# Patient Record
Sex: Female | Born: 1940
Health system: Southern US, Community
[De-identification: ages and names within clinical notes are randomized; demographics above are authoritative.]

## PROBLEM LIST (undated history)

## (undated) DIAGNOSIS — E7800 Pure hypercholesterolemia, unspecified: Secondary | ICD-10-CM

## (undated) DIAGNOSIS — I10 Essential (primary) hypertension: Secondary | ICD-10-CM

## (undated) DIAGNOSIS — M1990 Unspecified osteoarthritis, unspecified site: Secondary | ICD-10-CM

## (undated) DIAGNOSIS — I219 Acute myocardial infarction, unspecified: Secondary | ICD-10-CM

## (undated) DIAGNOSIS — I499 Cardiac arrhythmia, unspecified: Secondary | ICD-10-CM

## (undated) HISTORY — PX: BREAST EXCISIONAL BIOPSY: SUR124

## (undated) HISTORY — PX: VAGINAL HYSTERECTOMY: SHX2639

## (undated) HISTORY — PX: BREAST BIOPSY: SHX20

## (undated) HISTORY — PX: EYE SURGERY: SHX253

---

## 1999-10-14 ENCOUNTER — Other Ambulatory Visit: Admission: RE | Admit: 1999-10-14 | Discharge: 1999-10-14 | Payer: Self-pay | Admitting: Obstetrics and Gynecology

## 1999-10-15 ENCOUNTER — Ambulatory Visit (HOSPITAL_COMMUNITY): Admission: RE | Admit: 1999-10-15 | Discharge: 1999-10-15 | Payer: Self-pay | Admitting: Internal Medicine

## 1999-10-15 ENCOUNTER — Encounter: Payer: Self-pay | Admitting: Internal Medicine

## 1999-10-20 ENCOUNTER — Ambulatory Visit (HOSPITAL_COMMUNITY): Admission: RE | Admit: 1999-10-20 | Discharge: 1999-10-20 | Payer: Self-pay | Admitting: Internal Medicine

## 1999-10-20 ENCOUNTER — Encounter: Payer: Self-pay | Admitting: Internal Medicine

## 2000-06-05 ENCOUNTER — Emergency Department (HOSPITAL_COMMUNITY): Admission: EM | Admit: 2000-06-05 | Discharge: 2000-06-05 | Payer: Self-pay | Admitting: *Deleted

## 2000-06-05 HISTORY — PX: HERNIA REPAIR: SHX51

## 2000-06-06 ENCOUNTER — Ambulatory Visit (HOSPITAL_COMMUNITY): Admission: RE | Admit: 2000-06-06 | Discharge: 2000-06-06 | Payer: Self-pay | Admitting: Emergency Medicine

## 2000-06-06 ENCOUNTER — Encounter: Payer: Self-pay | Admitting: Emergency Medicine

## 2000-06-13 ENCOUNTER — Encounter (INDEPENDENT_AMBULATORY_CARE_PROVIDER_SITE_OTHER): Payer: Self-pay | Admitting: Specialist

## 2000-06-13 ENCOUNTER — Observation Stay (HOSPITAL_COMMUNITY): Admission: RE | Admit: 2000-06-13 | Discharge: 2000-06-14 | Payer: Self-pay | Admitting: Surgery

## 2000-06-13 HISTORY — PX: LAPAROSCOPIC CHOLECYSTECTOMY: SUR755

## 2000-06-17 ENCOUNTER — Encounter (INDEPENDENT_AMBULATORY_CARE_PROVIDER_SITE_OTHER): Payer: Self-pay | Admitting: *Deleted

## 2000-06-17 ENCOUNTER — Encounter: Payer: Self-pay | Admitting: Emergency Medicine

## 2000-06-18 ENCOUNTER — Encounter: Payer: Self-pay | Admitting: General Surgery

## 2000-06-18 ENCOUNTER — Inpatient Hospital Stay (HOSPITAL_COMMUNITY): Admission: EM | Admit: 2000-06-18 | Discharge: 2000-06-26 | Payer: Self-pay | Admitting: Emergency Medicine

## 2000-06-19 ENCOUNTER — Encounter: Payer: Self-pay | Admitting: General Surgery

## 2001-03-22 ENCOUNTER — Ambulatory Visit (HOSPITAL_COMMUNITY): Admission: RE | Admit: 2001-03-22 | Discharge: 2001-03-22 | Payer: Self-pay | Admitting: Internal Medicine

## 2001-03-22 ENCOUNTER — Encounter: Payer: Self-pay | Admitting: Internal Medicine

## 2007-11-12 ENCOUNTER — Ambulatory Visit (HOSPITAL_COMMUNITY): Admission: RE | Admit: 2007-11-12 | Discharge: 2007-11-12 | Payer: Self-pay | Admitting: Family Medicine

## 2008-06-05 ENCOUNTER — Ambulatory Visit (HOSPITAL_COMMUNITY): Admission: RE | Admit: 2008-06-05 | Discharge: 2008-06-05 | Payer: Self-pay | Admitting: Geriatric Medicine

## 2010-12-14 ENCOUNTER — Ambulatory Visit (HOSPITAL_COMMUNITY)
Admission: RE | Admit: 2010-12-14 | Discharge: 2010-12-14 | Payer: Self-pay | Source: Home / Self Care | Attending: Geriatric Medicine | Admitting: Geriatric Medicine

## 2011-04-23 NOTE — H&P (Signed)
[place]. [facility]  Patient:    Stephanie Newton, Stephanie Newton                    MRN: [id number] Adm. Date:  [id number] Attending:  Abigail Miyamoto A                         History and Physical  CHIEF COMPLAINT:  Crampy abdominal pain, nausea, vomiting, and diarrhea.  HISTORY OF PRESENT ILLNESS:  This 70 year old female is postoperative day #5 from a laparoscopic cholecystectomy with an uncomplicated postoperative course until this morning, when she began having some crampy abdominal pain, nausea, and then profuse large volume vomiting consisting of yellowish-greenish material.  Before that time, she had passed some gas and had a normal bowel movement.  She subsequently developed some diarrhea.  The cramp and pain continued.  She presented to the emergency department for evaluation.  She denied fever or chills.  PAST MEDICAL HISTORY:  Hypertension.  PREVIOUS OPERATIONS:  Breast biopsy, hysterectomy, laparoscopic cholecystectomy.  ALLERGIES:  None known.  MEDICATIONS:  Tenoretic 50/25 one q.d.  SOCIAL HISTORY:  No tobacco use.  She occasionally has an alcoholic beverage.  REVIEW OF SYSTEMS:  CARDIOVASCULAR:  No known heart disease.  PULMONARY:  No asthma, COPD, or TB.  NEUROLOGIC:  No seizure disorder or history of stroke. HEMATOLOGIC:  No report of blood transfusion.  ENDOCRINE:  No diabetes or thyroid disorder.  GI:  No hiatal hernia, reflux, peptic ulcer disease, or hepatitis.  PHYSICAL EXAMINATION:  GENERAL:  An ill-appearing female.  VITAL SIGNS:  Temperature 99.2, blood pressure 147/72, pulse 100.  SKIN:  Warm and dry without rash.  HEENT:  Eyes reveal extraocular motions intact and sclerae are clear.  NECK:  Supple without palpable masses.  CARDIOVASCULAR:  Heart demonstrates an increased rate with a regular rhythm. No lower extremity edema.  RESPIRATORY:  Breath sounds clear and equal.  Respirations unlabored.  ABDOMEN:  Soft and  distended.  There is mild, diffuse tenderness but no peritoneal signs.  The incisions are clean and intact.  There are hypoactive bowel sounds present.  LABORATORY DATA:  Abdominal x-rays demonstrate a few air filled small bowel loops but no significant dilatation.  There is a ______ of gas present.  Laboratory remarkable for white blood cell count 12,200.  Hemoglobin 15.4. Amylase, lipase, liver function tests all normal.  Potassium 5.0.  IMPRESSION:  Crampy abdominal pain with nausea and vomiting, and now some diarrhea.  This may be partial small intestinal obstruction versus and ileus versus a gastroenteritis.  PLAN: Admit, IV fluid hydration, will repeat x-rays and labs later today. DD:  06/18/00 TD:  06/18/00 Job: [id number]

## 2011-04-23 NOTE — Op Note (Signed)
[facility]  Patient:    Stephanie Newton, Stephanie Newton                      MRN: [id number] Proc. Date: 06/13/00 Attending:  Abigail Miyamoto, M.D.                           Operative Report  PREOPERATIVE DIAGNOSIS:  Symptomatic cholelithiasis.  POSTOPERATIVE DIAGNOSIS:  Acute cholecystitis.  OPERATION PERFORMED:  SURGEON:  Abigail Miyamoto, M.D.  ASSISTANT:  Milus Mallick, M.D.  ANESTHESIA:  General endotracheal.  ESTIMATED BLOOD LOSS:  Minimal.  DESCRIPTION OF PROCEDURE:  The patient was brought to the operating room and identified as [name].  She was placed supine on the operating table and general anesthesia was induced.  Her abdomen was then prepped and draped in the usual sterile fashion.  Using a #15 blade a small transverse incision was made just below the umbilicus.  The incision was carried down to the abdominal fascia.  The fascia was then opened with the scalpel.  The hemostat was then used to pass through the peritoneal cavity.  Next, a 0 Vicryl pursestring suture was placed around the fascial opening.  The side port was placed through the opening and insufflation of the abdomen was begun.  Next, an 11 port was placed in the patients epigastrium and two 5 mm ports were placed in the patients right flank all under direct vision.  The gallbladder was then identified.  It was found to be quite inflamed and edematous.  At this point a needle aspirator was inserted in the gallbladder and white bile was aspirated.  Next, the gallbladder was grasped and retracted above the liver bed.  Dissection was then carried out at the hilum.  The cystic duct was dissected out, clipped three times proximally and once distally and transected with the scissors.  The cystic artery and its branch and a posterior branch were both clipped twice proximally and once distally and transected as well. The gallbladder was then slowly dissected free from the liver  bed with the electrocautery.  During dissection the gallbladder was entered and stones were spilled.  Once the gallbladder was completely removed, it was placed in an endosac and removed through the umbilical port.  The abdomen was irrigated with normal saline.  Hemostasis in the liver bed was achieved with electrocautery.  The multiple stones that dropped out of the gallbladder were all removed under direct vision with a large grasper and a large sucker.  The abdomen was again copiously irrigated.  Hemostasis again appeared to be achieved.  At this point the side port was removed from the umbilicus and the 0 Vicryl pursestring suture was fastened in place.  All ports were removed under direct vision and the abdomen was deflated.  The patient tolerated the procedure well.  Sponge, needle and instrument counts were correct at the end of the procedure.  Each wound was then anesthetized with 0.25% Marcaine and then closed with subcutaneous 4-0 Monocryl sutures.  Steri-Strips, gauze and tape were then extubated in the operating room and taken in stable condition to the recovery. DD:  06/13/00 TD:  06/13/00 Job: [id number]

## 2011-04-23 NOTE — Discharge Summary (Signed)
[place]. [facility]  Patient:    Stephanie Newton, Stephanie Newton                    MRN: [id number] Adm. Date:  [id number] Disc. Date: [id number] Attending:  Abigail Miyamoto A                           Discharge Summary  PRINCIPAL DISCHARGE DIAGNOSIS:  Strangulated ventral incisional hernia.  SECONDARY DIAGNOSIS:  Hypertension.  PROCEDURE:  Exploratory laparotomy with small-bowel resection and primary repair of ventral hernia.  REASON FOR ADMISSION:  Ms. Stephanie Newton is a 70 year old female who presented to the emergency department on postoperative day #5 because of severe crampy abdominal pain and large volume vomiting.  At that time she was noted to have a low grade fever. There were a few air-filled small-bowel loops on abdominal x-rays and a white blood cell count of 12,200.  It was felt that she may just be having some ileus and was admitted.  HOSPITAL COURSE:  She continued to be distended and felt just a little better; however, by 36 hours the CT scan was ordered because of failure to progress. This demonstrated some incarcerated small-bowel and subumbilical herniation. She was subsequently taken to the operating room where she underwent the above procedure on June 19, 2000.  Postoperatively, she had a mild postoperative ileus and NG tube remained.  Her ileus slowly resolved and she had some hypokalemia that had to be corrected.  She started to have bowel activity and was started on a diet.  Her diet was advanced and she was discharged to home on June 26, 2000.  DISPOSITION:  The patient was discharged to home on June 26, 2000.  DISCHARGE MEDICATIONS:  She is to resume all of her usual home medications and to take the pain medication given to her by Abigail Miyamoto, M.D.  DISCHARGE INSTRUCTIONS:  She is told to perform no strenuous activity.  She was to call us back for any problems.  FOLLOW-UP:  She is to call to set up an appointment in the office in  two weeks.  DISCHARGE CONDITION:  She was discharged in satisfactory condition. DD:  07/25/00 TD:  07/26/00 Job: [id number]

## 2011-04-23 NOTE — Op Note (Signed)
[place]. [facility]  Patient:    Stephanie Newton, Stephanie Newton                    MRN: [id number] Proc. Date: 06/19/00 Adm. Date:  [id number] Attending:  Abigail Miyamoto A                           Operative Report  PREOPERATIVE DIAGNOSIS:  Small-bowel obstruction secondary to incarcerated ventral incisional hernia.  POSTOPERATIVE DIAGNOSIS:  Strangulated small-bowel obstruction secondary to incarcerated ventral incisional hernia.  PROCEDURE:  Exploratory laparotomy, small bowel resection and primary repair of ventral hernia.  SURGEON:  Adolph Pollack, M.D.  ANESTHESIA:  General.  INDICATION FOR OPERATION:  This is a 70 year old female, status post laparoscopic cholecystectomy, June 13, 2000.  On postop day #4, she had the onset of crampy abdominal pain with nausea and vomiting and large volume of bilious emesis.  She was admitted to the hospital and underwent abdominal films which had then improved.  She underwent a CT scan which demonstrated the herniation and small bowel out into the subcutaneous tissue with proximal obstruction.  She has been running a fever, has been increasingly tender and is brought to the operating room.  TECHNIQUE:  She was placed supine on the operating room and general anesthetic was administered.  The abdomen was sterilely prepped and draped.  A previous transverse subumbilical incision was reincised and opened up.  Approximately a 5-cm loop of small intestine was in the subcutaneous tissue and was strangulated.  I subsequently began at the inferior portion of the incision and made a limited lower midline incision, incising the skin, subcutaneous tissue and fascia.  I then brought up some small bowel.  I divided the small bowel with a stapler proximal and distal to the strangulated segment, then divided its mesentery between the clamps.  I ligated the mesenteric vessels with a Vicryl tie.  I then performed a side-to-side  anastomosis and it was stapled.  The anastomosis was patent but under no tension.  I closed the mesenteric defect with interrupted 3-0 Vicryl sutures.  The piece of small bowel was sent to pathology.  Next, I replaced the small bowel back into the abdominal cavity.  I then closed the fascia with running #1 PDS suture.  I irrigated the subcutaneous tissue and closed the skin with staples.  She tolerated the procedure well without any apparent complications and was taken to the recovery room in satisfactory condition. DD:  06/19/00 TD:  06/20/00 Job: [id number]

## 2012-03-16 DIAGNOSIS — R5381 Other malaise: Secondary | ICD-10-CM | POA: Diagnosis not present

## 2012-03-16 DIAGNOSIS — I10 Essential (primary) hypertension: Secondary | ICD-10-CM | POA: Diagnosis not present

## 2012-03-16 DIAGNOSIS — G479 Sleep disorder, unspecified: Secondary | ICD-10-CM | POA: Diagnosis not present

## 2012-06-27 DIAGNOSIS — H2510 Age-related nuclear cataract, unspecified eye: Secondary | ICD-10-CM | POA: Diagnosis not present

## 2012-08-01 DIAGNOSIS — J309 Allergic rhinitis, unspecified: Secondary | ICD-10-CM | POA: Diagnosis not present

## 2012-08-01 DIAGNOSIS — L259 Unspecified contact dermatitis, unspecified cause: Secondary | ICD-10-CM | POA: Diagnosis not present

## 2012-08-01 DIAGNOSIS — H1045 Other chronic allergic conjunctivitis: Secondary | ICD-10-CM | POA: Diagnosis not present

## 2012-09-14 DIAGNOSIS — G479 Sleep disorder, unspecified: Secondary | ICD-10-CM | POA: Diagnosis not present

## 2012-09-14 DIAGNOSIS — Z79899 Other long term (current) drug therapy: Secondary | ICD-10-CM | POA: Diagnosis not present

## 2012-09-14 DIAGNOSIS — Z0000 Encounter for general adult medical examination without abnormal findings: Secondary | ICD-10-CM | POA: Diagnosis not present

## 2012-09-14 DIAGNOSIS — F17200 Nicotine dependence, unspecified, uncomplicated: Secondary | ICD-10-CM | POA: Diagnosis not present

## 2012-09-14 DIAGNOSIS — Z1331 Encounter for screening for depression: Secondary | ICD-10-CM | POA: Diagnosis not present

## 2013-04-03 DIAGNOSIS — Z79899 Other long term (current) drug therapy: Secondary | ICD-10-CM | POA: Diagnosis not present

## 2013-04-03 DIAGNOSIS — M2550 Pain in unspecified joint: Secondary | ICD-10-CM | POA: Diagnosis not present

## 2013-04-03 DIAGNOSIS — E782 Mixed hyperlipidemia: Secondary | ICD-10-CM | POA: Diagnosis not present

## 2013-04-03 DIAGNOSIS — M25559 Pain in unspecified hip: Secondary | ICD-10-CM | POA: Diagnosis not present

## 2013-04-03 DIAGNOSIS — I10 Essential (primary) hypertension: Secondary | ICD-10-CM | POA: Diagnosis not present

## 2013-04-04 DIAGNOSIS — M76899 Other specified enthesopathies of unspecified lower limb, excluding foot: Secondary | ICD-10-CM | POA: Diagnosis not present

## 2013-04-04 DIAGNOSIS — M4310 Spondylolisthesis, site unspecified: Secondary | ICD-10-CM | POA: Diagnosis not present

## 2013-05-29 DIAGNOSIS — I10 Essential (primary) hypertension: Secondary | ICD-10-CM | POA: Diagnosis not present

## 2013-06-28 DIAGNOSIS — H2510 Age-related nuclear cataract, unspecified eye: Secondary | ICD-10-CM | POA: Diagnosis not present

## 2013-08-20 DIAGNOSIS — H2510 Age-related nuclear cataract, unspecified eye: Secondary | ICD-10-CM | POA: Diagnosis not present

## 2013-09-11 DIAGNOSIS — H2589 Other age-related cataract: Secondary | ICD-10-CM | POA: Diagnosis not present

## 2013-09-11 DIAGNOSIS — H2510 Age-related nuclear cataract, unspecified eye: Secondary | ICD-10-CM | POA: Diagnosis not present

## 2013-09-25 DIAGNOSIS — I10 Essential (primary) hypertension: Secondary | ICD-10-CM | POA: Diagnosis not present

## 2013-09-25 DIAGNOSIS — Z79899 Other long term (current) drug therapy: Secondary | ICD-10-CM | POA: Diagnosis not present

## 2013-10-09 DIAGNOSIS — H2589 Other age-related cataract: Secondary | ICD-10-CM | POA: Diagnosis not present

## 2013-10-09 DIAGNOSIS — H2510 Age-related nuclear cataract, unspecified eye: Secondary | ICD-10-CM | POA: Diagnosis not present

## 2014-03-16 DIAGNOSIS — J209 Acute bronchitis, unspecified: Secondary | ICD-10-CM | POA: Diagnosis not present

## 2014-03-26 DIAGNOSIS — Z79899 Other long term (current) drug therapy: Secondary | ICD-10-CM | POA: Diagnosis not present

## 2014-03-26 DIAGNOSIS — I10 Essential (primary) hypertension: Secondary | ICD-10-CM | POA: Diagnosis not present

## 2014-03-26 DIAGNOSIS — E782 Mixed hyperlipidemia: Secondary | ICD-10-CM | POA: Diagnosis not present

## 2014-05-31 DIAGNOSIS — H5310 Unspecified subjective visual disturbances: Secondary | ICD-10-CM | POA: Diagnosis not present

## 2014-05-31 DIAGNOSIS — Z961 Presence of intraocular lens: Secondary | ICD-10-CM | POA: Diagnosis not present

## 2014-08-02 DIAGNOSIS — H1045 Other chronic allergic conjunctivitis: Secondary | ICD-10-CM | POA: Diagnosis not present

## 2014-08-02 DIAGNOSIS — L259 Unspecified contact dermatitis, unspecified cause: Secondary | ICD-10-CM | POA: Diagnosis not present

## 2014-08-02 DIAGNOSIS — J309 Allergic rhinitis, unspecified: Secondary | ICD-10-CM | POA: Diagnosis not present

## 2014-08-16 DIAGNOSIS — L2089 Other atopic dermatitis: Secondary | ICD-10-CM | POA: Diagnosis not present

## 2014-09-04 DIAGNOSIS — M25559 Pain in unspecified hip: Secondary | ICD-10-CM | POA: Diagnosis not present

## 2014-09-10 DIAGNOSIS — M25552 Pain in left hip: Secondary | ICD-10-CM | POA: Diagnosis not present

## 2014-09-25 DIAGNOSIS — Z79899 Other long term (current) drug therapy: Secondary | ICD-10-CM | POA: Diagnosis not present

## 2014-09-25 DIAGNOSIS — E780 Pure hypercholesterolemia: Secondary | ICD-10-CM | POA: Diagnosis not present

## 2014-09-25 DIAGNOSIS — Z1389 Encounter for screening for other disorder: Secondary | ICD-10-CM | POA: Diagnosis not present

## 2014-09-25 DIAGNOSIS — I10 Essential (primary) hypertension: Secondary | ICD-10-CM | POA: Diagnosis not present

## 2014-09-25 DIAGNOSIS — F17210 Nicotine dependence, cigarettes, uncomplicated: Secondary | ICD-10-CM | POA: Diagnosis not present

## 2014-09-25 DIAGNOSIS — M2550 Pain in unspecified joint: Secondary | ICD-10-CM | POA: Diagnosis not present

## 2014-09-25 DIAGNOSIS — Z0000 Encounter for general adult medical examination without abnormal findings: Secondary | ICD-10-CM | POA: Diagnosis not present

## 2014-10-01 DIAGNOSIS — I10 Essential (primary) hypertension: Secondary | ICD-10-CM | POA: Diagnosis not present

## 2014-10-01 DIAGNOSIS — Z79899 Other long term (current) drug therapy: Secondary | ICD-10-CM | POA: Diagnosis not present

## 2014-10-01 DIAGNOSIS — E780 Pure hypercholesterolemia: Secondary | ICD-10-CM | POA: Diagnosis not present

## 2015-01-17 DIAGNOSIS — J0100 Acute maxillary sinusitis, unspecified: Secondary | ICD-10-CM | POA: Diagnosis not present

## 2015-02-10 DIAGNOSIS — J029 Acute pharyngitis, unspecified: Secondary | ICD-10-CM | POA: Diagnosis not present

## 2015-04-29 DIAGNOSIS — E780 Pure hypercholesterolemia: Secondary | ICD-10-CM | POA: Diagnosis not present

## 2015-04-29 DIAGNOSIS — I10 Essential (primary) hypertension: Secondary | ICD-10-CM | POA: Diagnosis not present

## 2015-04-29 DIAGNOSIS — Z79899 Other long term (current) drug therapy: Secondary | ICD-10-CM | POA: Diagnosis not present

## 2015-08-14 DIAGNOSIS — Z961 Presence of intraocular lens: Secondary | ICD-10-CM | POA: Diagnosis not present

## 2015-09-05 DIAGNOSIS — M25562 Pain in left knee: Secondary | ICD-10-CM | POA: Diagnosis not present

## 2015-09-05 DIAGNOSIS — G8929 Other chronic pain: Secondary | ICD-10-CM | POA: Diagnosis not present

## 2015-09-17 DIAGNOSIS — M1712 Unilateral primary osteoarthritis, left knee: Secondary | ICD-10-CM | POA: Diagnosis not present

## 2015-11-20 DIAGNOSIS — L249 Irritant contact dermatitis, unspecified cause: Secondary | ICD-10-CM | POA: Diagnosis not present

## 2015-12-29 ENCOUNTER — Other Ambulatory Visit: Payer: Self-pay

## 2015-12-29 ENCOUNTER — Other Ambulatory Visit: Payer: Self-pay | Admitting: Geriatric Medicine

## 2015-12-29 DIAGNOSIS — R21 Rash and other nonspecific skin eruption: Secondary | ICD-10-CM | POA: Diagnosis not present

## 2015-12-29 DIAGNOSIS — I10 Essential (primary) hypertension: Secondary | ICD-10-CM | POA: Diagnosis not present

## 2015-12-29 DIAGNOSIS — Z1231 Encounter for screening mammogram for malignant neoplasm of breast: Secondary | ICD-10-CM

## 2015-12-29 DIAGNOSIS — E7800 Pure hypercholesterolemia, unspecified: Secondary | ICD-10-CM | POA: Diagnosis not present

## 2015-12-29 DIAGNOSIS — Z1389 Encounter for screening for other disorder: Secondary | ICD-10-CM | POA: Diagnosis not present

## 2015-12-29 DIAGNOSIS — Z0000 Encounter for general adult medical examination without abnormal findings: Secondary | ICD-10-CM | POA: Diagnosis not present

## 2015-12-29 DIAGNOSIS — F17210 Nicotine dependence, cigarettes, uncomplicated: Secondary | ICD-10-CM | POA: Diagnosis not present

## 2015-12-29 DIAGNOSIS — Z79899 Other long term (current) drug therapy: Secondary | ICD-10-CM | POA: Diagnosis not present

## 2016-01-13 ENCOUNTER — Ambulatory Visit
Admission: RE | Admit: 2016-01-13 | Discharge: 2016-01-13 | Disposition: A | Discharge status: Home / Self Care | Payer: Medicare Other | Source: Ambulatory Visit | Attending: Geriatric Medicine | Admitting: Geriatric Medicine

## 2016-01-13 DIAGNOSIS — Z1231 Encounter for screening mammogram for malignant neoplasm of breast: Secondary | ICD-10-CM | POA: Diagnosis not present

## 2016-08-04 DIAGNOSIS — M25562 Pain in left knee: Secondary | ICD-10-CM | POA: Diagnosis not present

## 2016-08-04 DIAGNOSIS — M2242 Chondromalacia patellae, left knee: Secondary | ICD-10-CM | POA: Diagnosis not present

## 2016-08-04 DIAGNOSIS — M25462 Effusion, left knee: Secondary | ICD-10-CM | POA: Diagnosis not present

## 2016-08-04 DIAGNOSIS — M7122 Synovial cyst of popliteal space [Baker], left knee: Secondary | ICD-10-CM | POA: Diagnosis not present

## 2016-08-26 DIAGNOSIS — M25462 Effusion, left knee: Secondary | ICD-10-CM | POA: Diagnosis not present

## 2016-08-26 DIAGNOSIS — M25562 Pain in left knee: Secondary | ICD-10-CM | POA: Diagnosis not present

## 2016-08-26 DIAGNOSIS — M7122 Synovial cyst of popliteal space [Baker], left knee: Secondary | ICD-10-CM | POA: Diagnosis not present

## 2016-08-26 DIAGNOSIS — M1712 Unilateral primary osteoarthritis, left knee: Secondary | ICD-10-CM | POA: Diagnosis not present

## 2016-10-13 DIAGNOSIS — H26491 Other secondary cataract, right eye: Secondary | ICD-10-CM | POA: Diagnosis not present

## 2016-10-13 DIAGNOSIS — Z961 Presence of intraocular lens: Secondary | ICD-10-CM | POA: Diagnosis not present

## 2016-11-27 ENCOUNTER — Encounter (HOSPITAL_COMMUNITY): Payer: Self-pay | Admitting: Nurse Practitioner

## 2016-11-27 ENCOUNTER — Emergency Department (HOSPITAL_COMMUNITY)
Admission: EM | Admit: 2016-11-27 | Discharge: 2016-11-27 | Disposition: A | Discharge status: Home / Self Care | Payer: Medicare Other | Attending: Emergency Medicine | Admitting: Emergency Medicine

## 2016-11-27 DIAGNOSIS — I10 Essential (primary) hypertension: Secondary | ICD-10-CM | POA: Diagnosis not present

## 2016-11-27 DIAGNOSIS — E860 Dehydration: Secondary | ICD-10-CM | POA: Diagnosis not present

## 2016-11-27 DIAGNOSIS — R112 Nausea with vomiting, unspecified: Secondary | ICD-10-CM

## 2016-11-27 DIAGNOSIS — Z79899 Other long term (current) drug therapy: Secondary | ICD-10-CM | POA: Diagnosis not present

## 2016-11-27 DIAGNOSIS — R197 Diarrhea, unspecified: Secondary | ICD-10-CM | POA: Insufficient documentation

## 2016-11-27 DIAGNOSIS — E876 Hypokalemia: Secondary | ICD-10-CM | POA: Diagnosis not present

## 2016-11-27 DIAGNOSIS — F17200 Nicotine dependence, unspecified, uncomplicated: Secondary | ICD-10-CM | POA: Diagnosis not present

## 2016-11-27 HISTORY — DX: Pure hypercholesterolemia, unspecified: E78.00

## 2016-11-27 HISTORY — DX: Essential (primary) hypertension: I10

## 2016-11-27 LAB — COMPREHENSIVE METABOLIC PANEL
ALK PHOS: 66 U/L (ref 38–126)
ALT: 20 U/L (ref 14–54)
AST: 33 U/L (ref 15–41)
Albumin: 3.7 g/dL (ref 3.5–5.0)
Anion gap: 9 (ref 5–15)
BUN: 12 mg/dL (ref 6–20)
CALCIUM: 8.9 mg/dL (ref 8.9–10.3)
CHLORIDE: 111 mmol/L (ref 101–111)
CO2: 20 mmol/L — AB (ref 22–32)
CREATININE: 0.77 mg/dL (ref 0.44–1.00)
GFR calc Af Amer: >60 mL/min (ref >60)
Glucose, Bld: 130 mg/dL — ABNORMAL HIGH (ref 65–99)
Potassium: 2.8 mmol/L — ABNORMAL LOW (ref 3.5–5.1)
SODIUM: 140 mmol/L (ref 135–145)
Total Bilirubin: 0.5 mg/dL (ref 0.3–1.2)
Total Protein: 6.7 g/dL (ref 6.5–8.1)

## 2016-11-27 LAB — CBC WITH DIFFERENTIAL/PLATELET
BASOS ABS: 0 10*3/uL (ref 0.0–0.1)
Basophils Relative: 0 %
Eosinophils Absolute: 0 10*3/uL (ref 0.0–0.7)
Eosinophils Relative: 1 %
HEMATOCRIT: 41.7 % (ref 36.0–46.0)
HEMOGLOBIN: 14.5 g/dL (ref 12.0–15.0)
LYMPHS ABS: 1 10*3/uL (ref 0.7–4.0)
LYMPHS PCT: 23 %
MCH: 30.3 pg (ref 26.0–34.0)
MCHC: 34.8 g/dL (ref 30.0–36.0)
MCV: 87.1 fL (ref 78.0–100.0)
Monocytes Absolute: 0.4 10*3/uL (ref 0.1–1.0)
Monocytes Relative: 8 %
NEUTROS ABS: 3.1 10*3/uL (ref 1.7–7.7)
NEUTROS PCT: 68 %
PLATELETS: 193 10*3/uL (ref 150–400)
RBC: 4.79 MIL/uL (ref 3.87–5.11)
RDW: 13.8 % (ref 11.5–15.5)
WBC: 4.6 10*3/uL (ref 4.0–10.5)

## 2016-11-27 LAB — URINALYSIS, ROUTINE W REFLEX MICROSCOPIC
Bilirubin Urine: NEGATIVE
Glucose, UA: NEGATIVE mg/dL
Ketones, ur: NEGATIVE mg/dL
Nitrite: NEGATIVE
PROTEIN: NEGATIVE mg/dL
SPECIFIC GRAVITY, URINE: 1.01 (ref 1.005–1.030)
pH: 7 (ref 5.0–8.0)

## 2016-11-27 LAB — MAGNESIUM: Magnesium: 1.4 mg/dL — ABNORMAL LOW (ref 1.7–2.4)

## 2016-11-27 LAB — LIPASE, BLOOD: LIPASE: 19 U/L (ref 11–51)

## 2016-11-27 MED ORDER — SODIUM CHLORIDE 0.9 % IV SOLN: 30.0000 meq | Freq: Once | INTRAVENOUS | Status: DC

## 2016-11-27 MED ORDER — POTASSIUM CHLORIDE 10 MEQ/100ML IV SOLN
10.0000 meq | INTRAVENOUS | Status: AC
  Administered 2016-11-27 (×2): 10 meq via INTRAVENOUS
  Filled 2016-11-27 (×3): qty 100

## 2016-11-27 MED ORDER — SODIUM CHLORIDE 0.9 % IV BOLUS (SEPSIS)
500.0000 mL | Freq: Once | INTRAVENOUS | Status: AC
  Administered 2016-11-27: 500 mL via INTRAVENOUS

## 2016-11-27 MED ORDER — POTASSIUM CHLORIDE CRYS ER 20 MEQ PO TBCR
60.0000 meq | EXTENDED_RELEASE_TABLET | Freq: Once | ORAL | Status: AC
  Administered 2016-11-27: 60 meq via ORAL
  Filled 2016-11-27: qty 3

## 2016-11-27 MED ORDER — POTASSIUM CHLORIDE CRYS ER 20 MEQ PO TBCR: 20.0000 meq | EXTENDED_RELEASE_TABLET | Freq: Two times a day (BID) | ORAL | 0 refills | Status: DC

## 2016-11-27 MED ORDER — ONDANSETRON HCL 4 MG/2ML IJ SOLN
4.0000 mg | Freq: Once | INTRAMUSCULAR | Status: AC
  Administered 2016-11-27: 4 mg via INTRAVENOUS
  Filled 2016-11-27: qty 2

## 2016-11-27 MED ORDER — ONDANSETRON HCL 4 MG PO TABS: 4.0000 mg | ORAL_TABLET | Freq: Four times a day (QID) | ORAL | 0 refills | Status: DC

## 2016-11-27 MED ORDER — ATENOLOL 100 MG PO TABS
100.0000 mg | ORAL_TABLET | Freq: Once | ORAL | Status: AC
  Administered 2016-11-27: 100 mg via ORAL
  Filled 2016-11-27: qty 1

## 2016-11-27 MED ORDER — MAGNESIUM SULFATE 2 GM/50ML IV SOLN
2.0000 g | Freq: Once | INTRAVENOUS | Status: AC
  Administered 2016-11-27: 2 g via INTRAVENOUS
  Filled 2016-11-27: qty 50

## 2016-11-27 NOTE — Discharge Instructions (Signed)
He can restart taking your home blood pressure medications. Take the medications as prescribed. Please follow-up with her primary care doctor after the holiday weekend. Return without fail for worsening symptoms, including fever, confusion, intractable vomiting, concern for dehydration, worsening pain, or any other symptoms concerning to you.

## 2016-11-27 NOTE — ED Triage Notes (Signed)
Pt states she has a stomach bug, explains that she has experienced N/V/D for the last 4 days. As a result, she states has also not been taking her antihypertensives and currently hypertensive.EMS infused 500cc of NS which she states helped her feel a little better.

## 2016-11-27 NOTE — ED Provider Notes (Signed)
[place] DEPT Provider Note   CSN: [id number] Arrival date & time: 11/27/16  1539     History   Chief Complaint Chief Complaint  Patient presents with  . N/V/D    Stomach Bug  . Hypertension    HPI Stephanie Newton is a 75 y.o. female.  HPI 75 year old female who presents with nausea, vomiting, and diarrhea. She has a history of hypertension and hypercholesterolemia. No prior abdominal surgeries. States that 4 days ago she developed nausea, vomiting, and diarrhea, that has gradually improved. Her husband and son were sick with the same illness just prior to her. Diarrhea is nonbloody and vomiting is nonbloody and nonbilious. No abdominal pain, fevers, syncope or near syncope. Had one episode of diarrhea today and has not had vomiting for a few days, but having some persistent nausea. Has missed her blood pressure medications over the past few days, and noted some elevated blood pressures. Denies headaches, confusion, vision or speech changes, numbness or weakness, chest pain or difficulty breathing. Past Medical History:  Diagnosis Date  . Hypercholesteremia   . Hypertension     There are no active problems to display for this patient.   History reviewed. No pertinent surgical history.  OB History    No data available       Home Medications    Prior to Admission medications   Medication Sig Start Date End Date Taking? Authorizing Provider  ondansetron (ZOFRAN) 4 MG tablet Take 1 tablet (4 mg total) by mouth every 6 (six) hours. 11/27/16   Forde Dandy, MD  potassium chloride SA (K-DUR,KLOR-CON) 20 MEQ tablet Take 1 tablet (20 mEq total) by mouth 2 (two) times daily. 11/27/16   Forde Dandy, MD    Family History History reviewed. No pertinent family history.  Social History Social History  Substance Use Topics  . Smoking status: Current Some Day Smoker  . Smokeless tobacco: Never Used  . Alcohol use No     Allergies   Patient has no allergy  information on record.   Review of Systems Review of Systems  10/14 systems reviewed and are negative other than those stated in the HPI  Physical Exam Updated Vital Signs BP 161/90 (BP Location: Left Arm)   Pulse 78   Temp 98.5 F (36.9 C) (Oral)   Resp 24   SpO2 99%   Physical Exam Physical Exam  Nursing note and vitals reviewed. Constitutional: Well developed, well nourished, non-toxic, and in no acute distress Head: Normocephalic and atraumatic.  Mouth/Throat: Oropharynx is clear and dry mucous membranes.  Neck: Normal range of motion. Neck supple.  Cardiovascular: Normal rate and regular rhythm.   Pulmonary/Chest: Effort normal and breath sounds normal.  Abdominal: Soft. There is no tenderness. There is no rebound and no guarding.  Musculoskeletal: Normal range of motion.  Neurological: Alert, no facial droop, fluent speech, moves all extremities symmetrically Skin: Skin is warm and dry.  Psychiatric: Cooperative   ED Treatments / Results  Labs (all labs ordered are listed, but only abnormal results are displayed) Labs Reviewed  COMPREHENSIVE METABOLIC PANEL - Abnormal; Notable for the following:       Result Value   Potassium 2.8 (*)    CO2 20 (*)    Glucose, Bld 130 (*)    All other components within normal limits  URINALYSIS, ROUTINE W REFLEX MICROSCOPIC - Abnormal; Notable for the following:    Hgb urine dipstick SMALL (*)    Leukocytes, UA TRACE (*)  Bacteria, UA RARE (*)    Squamous Epithelial / LPF 0-5 (*)    All other components within normal limits  MAGNESIUM - Abnormal; Notable for the following:    Magnesium 1.4 (*)    All other components within normal limits  LIPASE, BLOOD  CBC WITH DIFFERENTIAL/PLATELET    EKG  EKG Interpretation  Date/Time:  Saturday November 27 2016 17:39:14 EST Ventricular Rate:  87 PR Interval:    QRS Duration: 81 QT Interval:  395 QTC Calculation: 476 R Axis:   48 Text Interpretation:  Sinus rhythm Low  voltage, precordial leads similar to last EKG Confirmed by Luisfernando Brightwell MD, Hinton Dyer [id number]) on 11/27/2016 6:32:12 PM       Radiology No results found.  Procedures Procedures (including critical care time)  Medications Ordered in ED Medications  potassium chloride 10 mEq in 100 mL IVPB (10 mEq Intravenous Not Given 11/27/16 2218)  ondansetron (ZOFRAN) injection 4 mg (4 mg Intravenous Given 11/27/16 1825)  sodium chloride 0.9 % bolus 500 mL (0 mLs Intravenous Stopped 11/27/16 1935)  potassium chloride SA (K-DUR,KLOR-CON) CR tablet 60 mEq (60 mEq Oral Given 11/27/16 1829)  magnesium sulfate IVPB 2 g 50 mL (0 g Intravenous Stopped 11/27/16 2310)  atenolol (TENORMIN) tablet 100 mg (100 mg Oral Given 11/27/16 2002)     Initial Impression / Assessment and Plan / ED Course  I have reviewed the triage vital signs and the nursing notes.  Pertinent labs & imaging results that were available during my care of the patient were reviewed by me and considered in my medical decision making (see chart for details).  Clinical Course     75 year old female who presents with nausea, vomiting, and diarrhea. She is nontoxic in no acute distress. He is hypertensive and the ED initially, but this is in the setting of not taking her blood pressure medications due to her GI illness. She is otherwise asymptomatic. She is otherwise hemodynamically stable. Distal dry on exam. Given IV fluids and antiemetics and feels improved. She is able to tolerate her by mouth medications.  Blood work is notable for hypokalemia of 2.8 and hypomagnesemia of 1.4. This is likely in the setting of her GI illness, but also exacerbated by her hydrochlorothiazide. She is given oral potassium repletion and IV potassium repletion with magnesium repletion. We'll discharge home with oral potassium. She feels improved after symptomatic treatment and I do feel she is stable for discharge home. She will follow up closely with her primary care  doctor.  Strict return and follow-up instructions reviewed. She expressed understanding of all discharge instructions and felt comfortable with the plan of care.   Final Clinical Impressions(s) / ED Diagnoses   Final diagnoses:  Nausea vomiting and diarrhea  Hypokalemia    New Prescriptions New Prescriptions   ONDANSETRON (ZOFRAN) 4 MG TABLET    Take 1 tablet (4 mg total) by mouth every 6 (six) hours.   POTASSIUM CHLORIDE SA (K-DUR,KLOR-CON) 20 MEQ TABLET    Take 1 tablet (20 mEq total) by mouth 2 (two) times daily.     Forde Dandy, MD 11/27/16 [phone]

## 2016-12-02 DIAGNOSIS — Z79899 Other long term (current) drug therapy: Secondary | ICD-10-CM | POA: Diagnosis not present

## 2016-12-07 DIAGNOSIS — I10 Essential (primary) hypertension: Secondary | ICD-10-CM | POA: Diagnosis not present

## 2016-12-07 DIAGNOSIS — K210 Gastro-esophageal reflux disease with esophagitis: Secondary | ICD-10-CM | POA: Diagnosis not present

## 2016-12-07 DIAGNOSIS — R110 Nausea: Secondary | ICD-10-CM | POA: Diagnosis not present

## 2016-12-07 DIAGNOSIS — Z79899 Other long term (current) drug therapy: Secondary | ICD-10-CM | POA: Diagnosis not present

## 2017-01-19 DIAGNOSIS — M1712 Unilateral primary osteoarthritis, left knee: Secondary | ICD-10-CM | POA: Diagnosis not present

## 2017-04-25 DIAGNOSIS — Z0000 Encounter for general adult medical examination without abnormal findings: Secondary | ICD-10-CM | POA: Diagnosis not present

## 2017-04-25 DIAGNOSIS — R7301 Impaired fasting glucose: Secondary | ICD-10-CM | POA: Diagnosis not present

## 2017-04-25 DIAGNOSIS — Z79899 Other long term (current) drug therapy: Secondary | ICD-10-CM | POA: Diagnosis not present

## 2017-04-25 DIAGNOSIS — E7800 Pure hypercholesterolemia, unspecified: Secondary | ICD-10-CM | POA: Diagnosis not present

## 2017-04-25 DIAGNOSIS — I10 Essential (primary) hypertension: Secondary | ICD-10-CM | POA: Diagnosis not present

## 2017-04-25 DIAGNOSIS — Z1389 Encounter for screening for other disorder: Secondary | ICD-10-CM | POA: Diagnosis not present

## 2017-07-15 DIAGNOSIS — M1712 Unilateral primary osteoarthritis, left knee: Secondary | ICD-10-CM | POA: Diagnosis not present

## 2017-07-22 DIAGNOSIS — M1712 Unilateral primary osteoarthritis, left knee: Secondary | ICD-10-CM | POA: Diagnosis not present

## 2017-08-02 DIAGNOSIS — I10 Essential (primary) hypertension: Secondary | ICD-10-CM | POA: Diagnosis not present

## 2017-08-02 DIAGNOSIS — M791 Myalgia: Secondary | ICD-10-CM | POA: Diagnosis not present

## 2017-08-02 DIAGNOSIS — R51 Headache: Secondary | ICD-10-CM | POA: Diagnosis not present

## 2017-08-03 DIAGNOSIS — M1712 Unilateral primary osteoarthritis, left knee: Secondary | ICD-10-CM | POA: Diagnosis not present

## 2017-08-03 DIAGNOSIS — S83232A Complex tear of medial meniscus, current injury, left knee, initial encounter: Secondary | ICD-10-CM | POA: Diagnosis not present

## 2017-08-23 DIAGNOSIS — G8918 Other acute postprocedural pain: Secondary | ICD-10-CM | POA: Diagnosis not present

## 2017-08-23 DIAGNOSIS — X58XXXA Exposure to other specified factors, initial encounter: Secondary | ICD-10-CM | POA: Diagnosis not present

## 2017-08-23 DIAGNOSIS — M1712 Unilateral primary osteoarthritis, left knee: Secondary | ICD-10-CM | POA: Diagnosis not present

## 2017-08-23 DIAGNOSIS — M23332 Other meniscus derangements, other medial meniscus, left knee: Secondary | ICD-10-CM | POA: Diagnosis not present

## 2017-08-23 DIAGNOSIS — Y999 Unspecified external cause status: Secondary | ICD-10-CM | POA: Diagnosis not present

## 2017-08-23 DIAGNOSIS — M94262 Chondromalacia, left knee: Secondary | ICD-10-CM | POA: Diagnosis not present

## 2017-08-23 DIAGNOSIS — M2242 Chondromalacia patellae, left knee: Secondary | ICD-10-CM | POA: Diagnosis not present

## 2017-08-23 DIAGNOSIS — S83232A Complex tear of medial meniscus, current injury, left knee, initial encounter: Secondary | ICD-10-CM | POA: Diagnosis not present

## 2017-08-23 HISTORY — PX: KNEE ARTHROSCOPY: SHX127

## 2017-08-30 DIAGNOSIS — M1712 Unilateral primary osteoarthritis, left knee: Secondary | ICD-10-CM | POA: Diagnosis not present

## 2017-08-30 DIAGNOSIS — Z4789 Encounter for other orthopedic aftercare: Secondary | ICD-10-CM | POA: Diagnosis not present

## 2017-08-30 DIAGNOSIS — S83232D Complex tear of medial meniscus, current injury, left knee, subsequent encounter: Secondary | ICD-10-CM | POA: Diagnosis not present

## 2017-09-28 DIAGNOSIS — S83232D Complex tear of medial meniscus, current injury, left knee, subsequent encounter: Secondary | ICD-10-CM | POA: Diagnosis not present

## 2017-09-28 DIAGNOSIS — Z4789 Encounter for other orthopedic aftercare: Secondary | ICD-10-CM | POA: Diagnosis not present

## 2017-09-28 DIAGNOSIS — M1712 Unilateral primary osteoarthritis, left knee: Secondary | ICD-10-CM | POA: Diagnosis not present

## 2017-10-06 DIAGNOSIS — G43A0 Cyclical vomiting, not intractable: Secondary | ICD-10-CM | POA: Diagnosis not present

## 2017-10-06 DIAGNOSIS — Z1211 Encounter for screening for malignant neoplasm of colon: Secondary | ICD-10-CM | POA: Diagnosis not present

## 2017-10-06 DIAGNOSIS — K648 Other hemorrhoids: Secondary | ICD-10-CM | POA: Diagnosis not present

## 2017-10-13 DIAGNOSIS — H5202 Hypermetropia, left eye: Secondary | ICD-10-CM | POA: Diagnosis not present

## 2017-10-13 DIAGNOSIS — H26491 Other secondary cataract, right eye: Secondary | ICD-10-CM | POA: Diagnosis not present

## 2017-10-31 DIAGNOSIS — M1712 Unilateral primary osteoarthritis, left knee: Secondary | ICD-10-CM | POA: Diagnosis not present

## 2018-01-30 DIAGNOSIS — M1712 Unilateral primary osteoarthritis, left knee: Secondary | ICD-10-CM | POA: Diagnosis not present

## 2018-02-03 DIAGNOSIS — I10 Essential (primary) hypertension: Secondary | ICD-10-CM | POA: Diagnosis not present

## 2018-02-03 DIAGNOSIS — M1712 Unilateral primary osteoarthritis, left knee: Secondary | ICD-10-CM | POA: Diagnosis not present

## 2018-02-15 ENCOUNTER — Ambulatory Visit: Payer: Self-pay | Admitting: Orthopedic Surgery

## 2018-02-17 DIAGNOSIS — H26491 Other secondary cataract, right eye: Secondary | ICD-10-CM | POA: Diagnosis not present

## 2018-02-28 DIAGNOSIS — H26491 Other secondary cataract, right eye: Secondary | ICD-10-CM | POA: Diagnosis not present

## 2018-03-06 DIAGNOSIS — M1712 Unilateral primary osteoarthritis, left knee: Secondary | ICD-10-CM | POA: Diagnosis not present

## 2018-03-06 DIAGNOSIS — M1710 Unilateral primary osteoarthritis, unspecified knee: Secondary | ICD-10-CM

## 2018-03-06 NOTE — H&P (Signed)
TOTAL KNEE ADMISSION H&P  Patient is being admitted for left total knee arthroplasty.  Subjective:  Chief Complaint:left knee pain.  HPI: Stephanie Newton, 77 y.o. female, has a history of pain and functional disability in the left knee due to arthritis and has failed non-surgical conservative treatments for greater than 12 weeks to includecorticosteriod injections, viscosupplementation injections and weight reduction as appropriate.  Onset of symptoms was gradual, starting 2 years ago with gradually worsening course since that time. The patient noted no past surgery on the left knee(s).  Patient currently rates pain in the left knee(s) at 7 out of 10 with activity. Patient has worsening of pain with activity and weight bearing, pain that interferes with activities of daily living, pain with passive range of motion and joint swelling.  Patient has evidence of subchondral sclerosis and joint space narrowing by imaging studies. There is no active infection.  Patient Active Problem List   Diagnosis Date Noted  . Primary osteoarthritis of knee 03/06/2018   Past Medical History:  Diagnosis Date  . Hypercholesteremia   . Hypertension     No past surgical history on file.  No current facility-administered medications for this encounter.    Current Outpatient Medications  Medication Sig Dispense Refill Last Dose  . ondansetron (ZOFRAN) 4 MG tablet Take 1 tablet (4 mg total) by mouth every 6 (six) hours. 12 tablet 0   . potassium chloride SA (K-DUR,KLOR-CON) 20 MEQ tablet Take 1 tablet (20 mEq total) by mouth 2 (two) times daily. 20 tablet 0    Not on File  Social History   Tobacco Use  . Smoking status: Current Some Day Smoker  . Smokeless tobacco: Never Used  Substance Use Topics  . Alcohol use: No    No family history on file.   Review of Systems  Constitutional: Negative.   HENT: Negative.   Eyes: Negative.   Respiratory: Negative.   Cardiovascular: Negative.    Gastrointestinal: Negative.   Genitourinary: Negative.   Musculoskeletal: Positive for joint pain.  Skin: Negative.   Neurological: Negative.   Endo/Heme/Allergies: Negative.   Psychiatric/Behavioral: Negative.     Objective:  Physical Exam  Constitutional: She is oriented to person, place, and time. She appears well-developed.  HENT:  Head: Normocephalic.  Eyes: EOM are normal.  Neck: Normal range of motion.  Cardiovascular: Normal rate, normal heart sounds and intact distal pulses.  Respiratory: Effort normal and breath sounds normal.  GI: Soft.  Genitourinary:  Genitourinary Comments: Deferred  Musculoskeletal:  Left knee pain with ambulating. Fair ROM. Calf is soft and nontender. Plantar and dorsi flexion intact.  Neurological: She is alert and oriented to person, place, and time.  Skin: Skin is warm and dry.  Psychiatric: Her behavior is normal.    Vital signs in last 24 hours: BP: ()/()  Arterial Line BP: ()/()   Labs:   There is no height or weight on file to calculate BMI.   Imaging Review Plain radiographs demonstrate severe degenerative joint disease of the left knee(s). The overall alignment ismild varus. The bone quality appears to be good for age and reported activity level.   Preoperative templating of the joint replacement has been completed, documented, and submitted to the Operating Room personnel in order to optimize intra-operative equipment management.   Anticipated LOS equal to or greater than 2 midnights due to - Age 61 and older with one or more of the following:  - Obesity  - Expected need for hospital services (PT,  OT, Nursing) required for safe  discharge  - Anticipated need for postoperative skilled nursing care or inpatient rehab  - Active co-morbidities: None OR   - Unanticipated findings during/Post Surgery: None  - Patient is a high risk of re-admission due to: None     Assessment/Plan:  End stage arthritis, left knee    The patient history, physical examination, clinical judgment of the provider and imaging studies are consistent with end stage degenerative joint disease of the left knee(s) and total knee arthroplasty is deemed medically necessary. The treatment options including medical management, injection therapy arthroscopy and arthroplasty were discussed at length. The risks and benefits of total knee arthroplasty were presented and reviewed. The risks due to aseptic loosening, infection, stiffness, patella tracking problems, thromboembolic complications and other imponderables were discussed. The patient acknowledged the explanation, agreed to proceed with the plan and consent was signed. Patient is being admitted for inpatient treatment for surgery, pain control, PT, OT, prophylactic antibiotics, VTE prophylaxis, progressive ambulation and ADL's and discharge planning. The patient is planning to be discharged home with OPPT.  Will use IV tranexamic acid. Contraindications and adverse affects of Tranexamic acid discussed in detail. Patient denies any of these at this time and understands the risks and benefits.

## 2018-03-21 ENCOUNTER — Other Ambulatory Visit (HOSPITAL_COMMUNITY): Payer: Self-pay | Admitting: Emergency Medicine

## 2018-03-21 ENCOUNTER — Encounter (HOSPITAL_COMMUNITY): Payer: Self-pay

## 2018-03-21 NOTE — Patient Instructions (Addendum)
Stephanie Newton  03/21/2018   Your procedure is scheduled on: 03-31-18   Report to [facility] Main  Entrance    Report to admitting at 1:15PM    Call this number if you have problems the morning of surgery [phone]       Remember: Do not eat food After Midnight. YOU MAY HAVE CLEAR LIQUIDS FROM MIDNIGHT UNTIL 9:45AM DAY OF SURGERY. NOTHING BY MOUTH AFTER 9:45AM!      Take these medicines the morning of surgery with A SIP OF WATER: ATENOLOL, TRAMADOL IF NEEDED                                You may not have any metal on your body including hair pins and              piercings  Do not wear jewelry, make-up, lotions, powders or perfumes, deodorant             Do not wear nail polish.  Do not shave  48 hours prior to surgery.     Do not bring valuables to the hospital. [place].  Contacts, dentures or bridgework may not be worn into surgery.  Leave suitcase in the car. After surgery it may be brought to your room.                  Please read over the following fact sheets you were given: _____________________________________________________________________           CLEAR LIQUID DIET   Foods Allowed                                                                     Foods Excluded  Coffee and tea, regular and decaf                             liquids that you cannot  Plain Jell-O in any flavor                                             see through such as: Fruit ices (not with fruit pulp)                                     milk, soups, orange juice  Iced Popsicles                                    All solid food Carbonated beverages, regular and diet  Cranberry, grape and apple juices Sports drinks like Gatorade Lightly seasoned clear broth or consume(fat free) Sugar, honey syrup  Sample Menu Breakfast                                Lunch                                      Supper Cranberry juice                    Beef broth                            Chicken broth Jell-O                                     Grape juice                           Apple juice Coffee or tea                        Jell-O                                      Popsicle                                                Coffee or tea                        Coffee or tea  _____________________________________________________________________  [facility] - Preparing for Surgery Before surgery, you can play an important role.  Because skin is not sterile, your skin needs to be as free of germs as possible.  You can reduce the number of germs on your skin by washing with CHG (chlorahexidine gluconate) soap before surgery.  CHG is an antiseptic cleaner which kills germs and bonds with the skin to continue killing germs even after washing. Please DO NOT use if you have an allergy to CHG or antibacterial soaps.  If your skin becomes reddened/irritated stop using the CHG and inform your nurse when you arrive at Short Stay. Do not shave (including legs and underarms) for at least 48 hours prior to the first CHG shower.  You may shave your face/neck. Please follow these instructions carefully:  1.  Shower with CHG Soap the night before surgery and the  morning of Surgery.  2.  If you choose to wash your hair, wash your hair first as usual with your  normal  shampoo.  3.  After you shampoo, rinse your hair and body thoroughly to remove the  shampoo.                           4.  Use CHG as you would any other liquid soap.  You can apply chg directly  to the skin and wash  Gently with a scrungie or clean washcloth.  5.  Apply the CHG Soap to your body ONLY FROM THE NECK DOWN.   Do not use on face/ open                           Wound or open sores. Avoid contact with eyes, ears mouth and genitals (private parts).                       Wash face,  Genitals (private  parts) with your normal soap.             6.  Wash thoroughly, paying special attention to the area where your surgery  will be performed.  7.  Thoroughly rinse your body with warm water from the neck down.  8.  DO NOT shower/wash with your normal soap after using and rinsing off  the CHG Soap.                9.  Pat yourself dry with a clean towel.            10.  Wear clean pajamas.            11.  Place clean sheets on your bed the night of your first shower and do not  sleep with pets. Day of Surgery : Do not apply any lotions/deodorants the morning of surgery.  Please wear clean clothes to the hospital/surgery center.  FAILURE TO FOLLOW THESE INSTRUCTIONS MAY RESULT IN THE CANCELLATION OF YOUR SURGERY PATIENT SIGNATURE_________________________________  NURSE SIGNATURE__________________________________  ________________________________________________________________________   Stephanie Newton  An incentive spirometer is a tool that can help keep your lungs clear and active. This tool measures how well you are filling your lungs with each breath. Taking long deep breaths may help reverse or decrease the chance of developing breathing (pulmonary) problems (especially infection) following:  A long period of time when you are unable to move or be active. BEFORE THE PROCEDURE   If the spirometer includes an indicator to show your best effort, your nurse or respiratory therapist will set it to a desired goal.  If possible, sit up straight or lean slightly forward. Try not to slouch.  Hold the incentive spirometer in an upright position. INSTRUCTIONS FOR USE  1. Sit on the edge of your bed if possible, or sit up as far as you can in bed or on a chair. 2. Hold the incentive spirometer in an upright position. 3. Breathe out normally. 4. Place the mouthpiece in your mouth and seal your lips tightly around it. 5. Breathe in slowly and as deeply as possible, raising the piston or the  ball toward the top of the column. 6. Hold your breath for 3-5 seconds or for as long as possible. Allow the piston or ball to fall to the bottom of the column. 7. Remove the mouthpiece from your mouth and breathe out normally. 8. Rest for a few seconds and repeat Steps 1 through 7 at least 10 times every 1-2 hours when you are awake. Take your time and take a few normal breaths between deep breaths. 9. The spirometer may include an indicator to show your best effort. Use the indicator as a goal to work toward during each repetition. 10. After each set of 10 deep breaths, practice coughing to be sure your lungs are clear. If you have an incision (the cut made at the time of  surgery), support your incision when coughing by placing a pillow or rolled up towels firmly against it. Once you are able to get out of bed, walk around indoors and cough well. You may stop using the incentive spirometer when instructed by your caregiver.  RISKS AND COMPLICATIONS  Take your time so you do not get dizzy or light-headed.  If you are in pain, you may need to take or ask for pain medication before doing incentive spirometry. It is harder to take a deep breath if you are having pain. AFTER USE  Rest and breathe slowly and easily.  It can be helpful to keep track of a log of your progress. Your caregiver can provide you with a simple table to help with this. If you are using the spirometer at home, follow these instructions: [place] IF:   You are having difficultly using the spirometer.  You have trouble using the spirometer as often as instructed.  Your pain medication is not giving enough relief while using the spirometer.  You develop fever of 100.5 F (38.1 C) or higher. SEEK IMMEDIATE MEDICAL CARE IF:   You cough up bloody sputum that had not been present before.  You develop fever of 102 F (38.9 C) or greater.  You develop worsening pain at or near the incision site. MAKE SURE YOU:    Understand these instructions.  Will watch your condition.  Will get help right away if you are not doing well or get worse. Document Released: 04/04/2007 Document Revised: 02/14/2012 Document Reviewed: 06/05/2007 [facility] Patient Information 2014 [place], [place].   ________________________________________________________________________

## 2018-03-21 NOTE — Progress Notes (Signed)
SURGICAL CLEARANCE DR. HAL STONEKING ON CHART

## 2018-03-22 ENCOUNTER — Other Ambulatory Visit: Payer: Self-pay

## 2018-03-22 ENCOUNTER — Encounter (HOSPITAL_COMMUNITY)
Admission: RE | Admit: 2018-03-22 | Discharge: 2018-03-22 | Disposition: A | Discharge status: Home / Self Care | Payer: Medicare Other | Source: Ambulatory Visit | Attending: Specialist | Admitting: Specialist

## 2018-03-22 ENCOUNTER — Encounter (HOSPITAL_COMMUNITY): Payer: Self-pay | Admitting: Emergency Medicine

## 2018-03-22 DIAGNOSIS — M1712 Unilateral primary osteoarthritis, left knee: Secondary | ICD-10-CM | POA: Insufficient documentation

## 2018-03-22 DIAGNOSIS — R001 Bradycardia, unspecified: Secondary | ICD-10-CM | POA: Diagnosis not present

## 2018-03-22 DIAGNOSIS — Z01812 Encounter for preprocedural laboratory examination: Secondary | ICD-10-CM | POA: Diagnosis not present

## 2018-03-22 DIAGNOSIS — Z01810 Encounter for preprocedural cardiovascular examination: Secondary | ICD-10-CM | POA: Diagnosis not present

## 2018-03-22 DIAGNOSIS — I10 Essential (primary) hypertension: Secondary | ICD-10-CM | POA: Diagnosis not present

## 2018-03-22 LAB — URINALYSIS, ROUTINE W REFLEX MICROSCOPIC
Bilirubin Urine: NEGATIVE
Glucose, UA: NEGATIVE mg/dL
Ketones, ur: 5 mg/dL — AB
NITRITE: NEGATIVE
PROTEIN: NEGATIVE mg/dL
SPECIFIC GRAVITY, URINE: 1.016 (ref 1.005–1.030)
pH: 5 (ref 5.0–8.0)

## 2018-03-22 LAB — CBC
HEMATOCRIT: 43.6 % (ref 36.0–46.0)
HEMOGLOBIN: 14.5 g/dL (ref 12.0–15.0)
MCH: 30.1 pg (ref 26.0–34.0)
MCHC: 33.3 g/dL (ref 30.0–36.0)
MCV: 90.5 fL (ref 78.0–100.0)
Platelets: 216 10*3/uL (ref 150–400)
RBC: 4.82 MIL/uL (ref 3.87–5.11)
RDW: 13.8 % (ref 11.5–15.5)
WBC: 4.2 10*3/uL (ref 4.0–10.5)

## 2018-03-22 LAB — APTT: aPTT: 34 seconds (ref 24–36)

## 2018-03-22 LAB — BASIC METABOLIC PANEL
ANION GAP: 8 (ref 5–15)
BUN: 12 mg/dL (ref 6–20)
CHLORIDE: 104 mmol/L (ref 101–111)
CO2: 27 mmol/L (ref 22–32)
Calcium: 9.8 mg/dL (ref 8.9–10.3)
Creatinine, Ser: 0.78 mg/dL (ref 0.44–1.00)
GFR calc Af Amer: >60 mL/min (ref >60)
GFR calc non Af Amer: >60 mL/min (ref >60)
GLUCOSE: 101 mg/dL — AB (ref 65–99)
POTASSIUM: 4 mmol/L (ref 3.5–5.1)
Sodium: 139 mmol/L (ref 135–145)

## 2018-03-22 LAB — ABO/RH: ABO/RH(D): O POS

## 2018-03-22 LAB — SURGICAL PCR SCREEN
MRSA, PCR: NEGATIVE
Staphylococcus aureus: NEGATIVE

## 2018-03-22 LAB — PROTIME-INR
INR: 0.93
Prothrombin Time: 12.3 seconds (ref 11.4–15.2)

## 2018-03-22 NOTE — Progress Notes (Signed)
URINALYSIS ROUTED TO DR Theda Sers VI Epic

## 2018-03-25 DIAGNOSIS — M5030 Other cervical disc degeneration, unspecified cervical region: Secondary | ICD-10-CM | POA: Diagnosis not present

## 2018-03-30 MED ORDER — TRANEXAMIC ACID 1000 MG/10ML IV SOLN
1000.0000 mg | INTRAVENOUS | Status: AC
  Administered 2018-03-31: 1000 mg via INTRAVENOUS
  Filled 2018-03-30: qty 1100

## 2018-03-31 ENCOUNTER — Inpatient Hospital Stay (HOSPITAL_COMMUNITY): Payer: Medicare Other | Admitting: Anesthesiology

## 2018-03-31 ENCOUNTER — Encounter (HOSPITAL_COMMUNITY): Payer: Self-pay | Admitting: *Deleted

## 2018-03-31 ENCOUNTER — Encounter (HOSPITAL_COMMUNITY)
Admission: RE | Disposition: A | Discharge status: Home Health | Payer: Self-pay | Source: Home / Self Care | Attending: Specialist

## 2018-03-31 ENCOUNTER — Other Ambulatory Visit: Payer: Self-pay

## 2018-03-31 ENCOUNTER — Inpatient Hospital Stay (HOSPITAL_COMMUNITY)
Admission: RE | Admit: 2018-03-31 | Discharge: 2018-04-02 | DRG: 470 | Disposition: A | Discharge status: Home Health | Payer: Medicare Other | Attending: Specialist | Admitting: Specialist

## 2018-03-31 DIAGNOSIS — M1712 Unilateral primary osteoarthritis, left knee: Principal | ICD-10-CM | POA: Diagnosis present

## 2018-03-31 DIAGNOSIS — E7800 Pure hypercholesterolemia, unspecified: Secondary | ICD-10-CM | POA: Diagnosis present

## 2018-03-31 DIAGNOSIS — M25762 Osteophyte, left knee: Secondary | ICD-10-CM | POA: Diagnosis present

## 2018-03-31 DIAGNOSIS — Z96659 Presence of unspecified artificial knee joint: Secondary | ICD-10-CM

## 2018-03-31 DIAGNOSIS — G8918 Other acute postprocedural pain: Secondary | ICD-10-CM | POA: Diagnosis not present

## 2018-03-31 DIAGNOSIS — F17200 Nicotine dependence, unspecified, uncomplicated: Secondary | ICD-10-CM | POA: Diagnosis present

## 2018-03-31 DIAGNOSIS — I10 Essential (primary) hypertension: Secondary | ICD-10-CM | POA: Diagnosis present

## 2018-03-31 DIAGNOSIS — M1710 Unilateral primary osteoarthritis, unspecified knee: Secondary | ICD-10-CM

## 2018-03-31 DIAGNOSIS — M25562 Pain in left knee: Secondary | ICD-10-CM | POA: Diagnosis not present

## 2018-03-31 DIAGNOSIS — Z79899 Other long term (current) drug therapy: Secondary | ICD-10-CM

## 2018-03-31 HISTORY — PX: TOTAL KNEE ARTHROPLASTY: SHX125

## 2018-03-31 LAB — TYPE AND SCREEN
ABO/RH(D): O POS
Antibody Screen: NEGATIVE

## 2018-03-31 SURGERY — ARTHROPLASTY, KNEE, TOTAL
Anesthesia: Spinal | Site: Knee | Laterality: Left

## 2018-03-31 MED ORDER — PROPOFOL 500 MG/50ML IV EMUL
INTRAVENOUS | Status: DC | PRN
  Administered 2018-03-31: 75 ug/kg/min via INTRAVENOUS

## 2018-03-31 MED ORDER — DEXAMETHASONE SODIUM PHOSPHATE 10 MG/ML IJ SOLN
10.0000 mg | Freq: Once | INTRAMUSCULAR | Status: AC
  Administered 2018-03-31: 10 mg via INTRAVENOUS

## 2018-03-31 MED ORDER — DEXAMETHASONE SODIUM PHOSPHATE 10 MG/ML IJ SOLN
10.0000 mg | Freq: Once | INTRAMUSCULAR | Status: AC
  Administered 2018-04-01: 10 mg via INTRAVENOUS
  Filled 2018-03-31: qty 1

## 2018-03-31 MED ORDER — POTASSIUM CHLORIDE CRYS ER 20 MEQ PO TBCR
20.0000 meq | EXTENDED_RELEASE_TABLET | Freq: Two times a day (BID) | ORAL | Status: DC
  Administered 2018-03-31 – 2018-04-02 (×4): 20 meq via ORAL
  Filled 2018-03-31 (×4): qty 1

## 2018-03-31 MED ORDER — MIDAZOLAM HCL 5 MG/5ML IJ SOLN
INTRAMUSCULAR | Status: DC | PRN
  Administered 2018-03-31: 2 mg via INTRAVENOUS

## 2018-03-31 MED ORDER — MENTHOL 3 MG MT LOZG: 1.0000 | LOZENGE | OROMUCOSAL | Status: DC | PRN

## 2018-03-31 MED ORDER — METOCLOPRAMIDE HCL 5 MG/ML IJ SOLN: 5.0000 mg | Freq: Three times a day (TID) | INTRAMUSCULAR | Status: DC | PRN

## 2018-03-31 MED ORDER — BUPIVACAINE IN DEXTROSE 0.75-8.25 % IT SOLN
INTRATHECAL | Status: DC | PRN
  Administered 2018-03-31: 2 mL via INTRATHECAL

## 2018-03-31 MED ORDER — BUPIVACAINE HCL (PF) 0.25 % IJ SOLN
INTRAMUSCULAR | Status: DC | PRN
  Administered 2018-03-31: 30 mL

## 2018-03-31 MED ORDER — CEFAZOLIN SODIUM-DEXTROSE 2-4 GM/100ML-% IV SOLN
2.0000 g | INTRAVENOUS | Status: AC
  Administered 2018-03-31: 2 g via INTRAVENOUS
  Filled 2018-03-31: qty 100

## 2018-03-31 MED ORDER — CLONIDINE HCL (ANALGESIA) 100 MCG/ML EP SOLN
EPIDURAL | Status: DC | PRN
  Administered 2018-03-31: 50 ug

## 2018-03-31 MED ORDER — ATENOLOL 100 MG PO TABS
100.0000 mg | ORAL_TABLET | Freq: Every day | ORAL | Status: DC
  Administered 2018-04-01 – 2018-04-02 (×2): 100 mg via ORAL
  Filled 2018-03-31 (×2): qty 4
  Filled 2018-03-31 (×2): qty 1

## 2018-03-31 MED ORDER — BUPIVACAINE HCL (PF) 0.25 % IJ SOLN
INTRAMUSCULAR | Status: AC
  Filled 2018-03-31: qty 30

## 2018-03-31 MED ORDER — KETOROLAC TROMETHAMINE 30 MG/ML IJ SOLN
INTRAMUSCULAR | Status: AC
  Filled 2018-03-31: qty 1

## 2018-03-31 MED ORDER — PHENOL 1.4 % MT LIQD
1.0000 | OROMUCOSAL | Status: DC | PRN
  Filled 2018-03-31: qty 177

## 2018-03-31 MED ORDER — FENTANYL CITRATE (PF) 100 MCG/2ML IJ SOLN
INTRAMUSCULAR | Status: DC | PRN
  Administered 2018-03-31: 50 ug via INTRAVENOUS

## 2018-03-31 MED ORDER — SODIUM CHLORIDE 0.9 % IJ SOLN
INTRAMUSCULAR | Status: DC | PRN
  Administered 2018-03-31: 30 mL

## 2018-03-31 MED ORDER — SODIUM CHLORIDE 0.9 % IV SOLN
INTRAVENOUS | Status: DC
  Administered 2018-03-31: 22:00:00 via INTRAVENOUS

## 2018-03-31 MED ORDER — ESTRADIOL 1 MG PO TABS
1.0000 mg | ORAL_TABLET | Freq: Every day | ORAL | Status: DC
  Administered 2018-04-01 – 2018-04-02 (×2): 1 mg via ORAL
  Filled 2018-03-31 (×2): qty 1

## 2018-03-31 MED ORDER — FENTANYL CITRATE (PF) 100 MCG/2ML IJ SOLN
50.0000 ug | Freq: Once | INTRAMUSCULAR | Status: AC
  Administered 2018-03-31: 50 ug via INTRAVENOUS
  Filled 2018-03-31: qty 2

## 2018-03-31 MED ORDER — METHOCARBAMOL 1000 MG/10ML IJ SOLN
500.0000 mg | Freq: Four times a day (QID) | INTRAVENOUS | Status: DC | PRN
  Filled 2018-03-31: qty 5

## 2018-03-31 MED ORDER — POLYETHYLENE GLYCOL 3350 17 G PO PACK: 17.0000 g | PACK | Freq: Every day | ORAL | Status: DC | PRN

## 2018-03-31 MED ORDER — CHLORHEXIDINE GLUCONATE 4 % EX LIQD: 60.0000 mL | Freq: Once | CUTANEOUS | Status: DC

## 2018-03-31 MED ORDER — FENTANYL CITRATE (PF) 100 MCG/2ML IJ SOLN
INTRAMUSCULAR | Status: AC
  Filled 2018-03-31: qty 2

## 2018-03-31 MED ORDER — ONDANSETRON HCL 4 MG/2ML IJ SOLN
INTRAMUSCULAR | Status: AC
  Filled 2018-03-31: qty 2

## 2018-03-31 MED ORDER — MAGNESIUM CITRATE PO SOLN: 1.0000 | Freq: Once | ORAL | Status: DC | PRN

## 2018-03-31 MED ORDER — ACETAMINOPHEN 500 MG PO TABS
1000.0000 mg | ORAL_TABLET | Freq: Four times a day (QID) | ORAL | Status: AC
  Administered 2018-03-31 – 2018-04-01 (×3): 1000 mg via ORAL
  Filled 2018-03-31 (×3): qty 2

## 2018-03-31 MED ORDER — FERROUS SULFATE 325 (65 FE) MG PO TABS
325.0000 mg | ORAL_TABLET | Freq: Three times a day (TID) | ORAL | Status: DC
  Administered 2018-03-31 – 2018-04-02 (×5): 325 mg via ORAL
  Filled 2018-03-31 (×5): qty 1

## 2018-03-31 MED ORDER — OXYCODONE HCL 5 MG PO TABS: 5.0000 mg | ORAL_TABLET | ORAL | 0 refills | Status: AC | PRN

## 2018-03-31 MED ORDER — ASPIRIN EC 325 MG PO TBEC: 325.0000 mg | DELAYED_RELEASE_TABLET | Freq: Two times a day (BID) | ORAL | 0 refills | Status: AC

## 2018-03-31 MED ORDER — PROPOFOL 10 MG/ML IV BOLUS
INTRAVENOUS | Status: AC
  Filled 2018-03-31: qty 20

## 2018-03-31 MED ORDER — PHENYLEPHRINE HCL 10 MG/ML IJ SOLN
INTRAMUSCULAR | Status: DC | PRN
  Administered 2018-03-31: 120 ug via INTRAVENOUS

## 2018-03-31 MED ORDER — SIMVASTATIN 20 MG PO TABS
20.0000 mg | ORAL_TABLET | Freq: Every evening | ORAL | Status: DC
  Administered 2018-03-31 – 2018-04-01 (×2): 20 mg via ORAL
  Filled 2018-03-31 (×2): qty 1

## 2018-03-31 MED ORDER — DEXAMETHASONE SODIUM PHOSPHATE 10 MG/ML IJ SOLN
INTRAMUSCULAR | Status: AC
  Filled 2018-03-31: qty 1

## 2018-03-31 MED ORDER — METOCLOPRAMIDE HCL 5 MG PO TABS: 5.0000 mg | ORAL_TABLET | Freq: Three times a day (TID) | ORAL | Status: DC | PRN

## 2018-03-31 MED ORDER — LISINOPRIL 10 MG PO TABS
10.0000 mg | ORAL_TABLET | Freq: Every day | ORAL | Status: DC
  Administered 2018-03-31 – 2018-04-02 (×2): 10 mg via ORAL
  Filled 2018-03-31 (×3): qty 1

## 2018-03-31 MED ORDER — KETOROLAC TROMETHAMINE 30 MG/ML IJ SOLN
INTRAMUSCULAR | Status: DC | PRN
  Administered 2018-03-31: 30 mg

## 2018-03-31 MED ORDER — SALINE SPRAY 0.65 % NA SOLN
1.0000 | NASAL | Status: DC | PRN
  Filled 2018-03-31: qty 44

## 2018-03-31 MED ORDER — ALUM & MAG HYDROXIDE-SIMETH 200-200-20 MG/5ML PO SUSP: 30.0000 mL | ORAL | Status: DC | PRN

## 2018-03-31 MED ORDER — OXYCODONE HCL 5 MG PO TABS
5.0000 mg | ORAL_TABLET | ORAL | Status: DC | PRN
  Filled 2018-03-31: qty 2

## 2018-03-31 MED ORDER — HYDROCHLOROTHIAZIDE 12.5 MG PO CAPS
12.5000 mg | ORAL_CAPSULE | Freq: Every day | ORAL | Status: DC
  Administered 2018-04-02: 12.5 mg via ORAL
  Filled 2018-03-31 (×2): qty 1

## 2018-03-31 MED ORDER — STERILE WATER FOR IRRIGATION IR SOLN
Status: DC | PRN
  Administered 2018-03-31: 2000 mL

## 2018-03-31 MED ORDER — DOCUSATE SODIUM 100 MG PO CAPS
100.0000 mg | ORAL_CAPSULE | Freq: Two times a day (BID) | ORAL | Status: DC
  Administered 2018-03-31 – 2018-04-02 (×4): 100 mg via ORAL
  Filled 2018-03-31 (×4): qty 1

## 2018-03-31 MED ORDER — SODIUM CHLORIDE 0.9 % IJ SOLN
INTRAMUSCULAR | Status: AC
  Filled 2018-03-31: qty 50

## 2018-03-31 MED ORDER — BISACODYL 5 MG PO TBEC: 5.0000 mg | DELAYED_RELEASE_TABLET | Freq: Every day | ORAL | Status: DC | PRN

## 2018-03-31 MED ORDER — LACTATED RINGERS IV SOLN
INTRAVENOUS | Status: DC
  Administered 2018-03-31 (×3): via INTRAVENOUS

## 2018-03-31 MED ORDER — ROPIVACAINE HCL 7.5 MG/ML IJ SOLN
INTRAMUSCULAR | Status: DC | PRN
  Administered 2018-03-31: 20 mL via PERINEURAL

## 2018-03-31 MED ORDER — DIPHENHYDRAMINE HCL 12.5 MG/5ML PO ELIX: 12.5000 mg | ORAL_SOLUTION | ORAL | Status: DC | PRN

## 2018-03-31 MED ORDER — 0.9 % SODIUM CHLORIDE (POUR BTL) OPTIME
TOPICAL | Status: DC | PRN
  Administered 2018-03-31: 1000 mL

## 2018-03-31 MED ORDER — ONDANSETRON HCL 4 MG PO TABS: 4.0000 mg | ORAL_TABLET | Freq: Four times a day (QID) | ORAL | Status: DC | PRN

## 2018-03-31 MED ORDER — CEFAZOLIN SODIUM-DEXTROSE 2-4 GM/100ML-% IV SOLN
2.0000 g | Freq: Four times a day (QID) | INTRAVENOUS | Status: AC
  Administered 2018-03-31 – 2018-04-01 (×2): 2 g via INTRAVENOUS
  Filled 2018-03-31 (×2): qty 100

## 2018-03-31 MED ORDER — ACETAMINOPHEN 325 MG PO TABS
325.0000 mg | ORAL_TABLET | Freq: Four times a day (QID) | ORAL | Status: DC | PRN
  Administered 2018-04-02: 650 mg via ORAL
  Filled 2018-03-31: qty 2

## 2018-03-31 MED ORDER — METHOCARBAMOL 500 MG PO TABS
500.0000 mg | ORAL_TABLET | Freq: Four times a day (QID) | ORAL | Status: DC | PRN
  Administered 2018-04-01 – 2018-04-02 (×4): 500 mg via ORAL
  Filled 2018-03-31 (×4): qty 1

## 2018-03-31 MED ORDER — GLYCOPYRROLATE 0.2 MG/ML IJ SOLN
INTRAMUSCULAR | Status: DC | PRN
  Administered 2018-03-31: 0.2 mg via INTRAVENOUS

## 2018-03-31 MED ORDER — ONDANSETRON HCL 4 MG/2ML IJ SOLN
4.0000 mg | Freq: Four times a day (QID) | INTRAMUSCULAR | Status: DC | PRN
Start: 2018-03-31 — End: 2018-04-02

## 2018-03-31 MED ORDER — OXYCODONE HCL 5 MG PO TABS
10.0000 mg | ORAL_TABLET | ORAL | Status: DC | PRN
  Administered 2018-04-01 (×4): 15 mg via ORAL
  Administered 2018-04-01: 10 mg via ORAL
  Administered 2018-04-02: 15 mg via ORAL
  Filled 2018-03-31 (×5): qty 3

## 2018-03-31 MED ORDER — PROPOFOL 10 MG/ML IV BOLUS
INTRAVENOUS | Status: DC | PRN
  Administered 2018-03-31: 50 mg via INTRAVENOUS

## 2018-03-31 MED ORDER — HYDROMORPHONE HCL 1 MG/ML IJ SOLN: 0.5000 mg | INTRAMUSCULAR | Status: DC | PRN

## 2018-03-31 MED ORDER — ENOXAPARIN SODIUM 30 MG/0.3ML ~~LOC~~ SOLN
30.0000 mg | Freq: Two times a day (BID) | SUBCUTANEOUS | Status: DC
  Administered 2018-04-01 – 2018-04-02 (×3): 30 mg via SUBCUTANEOUS
  Filled 2018-03-31 (×3): qty 0.3

## 2018-03-31 MED ORDER — PHENYLEPHRINE HCL 10 MG/ML IJ SOLN
INTRAVENOUS | Status: DC | PRN
  Administered 2018-03-31: 25 ug/min via INTRAVENOUS

## 2018-03-31 MED ORDER — BACLOFEN 10 MG PO TABS: 10.0000 mg | ORAL_TABLET | Freq: Three times a day (TID) | ORAL | 1 refills | Status: DC | PRN

## 2018-03-31 SURGICAL SUPPLY — 58 items
BAG DECANTER FOR FLEXI CONT (MISCELLANEOUS) IMPLANT
BAG ZIPLOCK 12X15 (MISCELLANEOUS) ×6 IMPLANT
BANDAGE ACE 4X5 VEL STRL LF (GAUZE/BANDAGES/DRESSINGS) ×3 IMPLANT
BANDAGE ACE 6X5 VEL STRL LF (GAUZE/BANDAGES/DRESSINGS) ×3 IMPLANT
BLADE SAG 18X100X1.27 (BLADE) ×3 IMPLANT
BLADE SAW SGTL 13.0X1.19X90.0M (BLADE) ×3 IMPLANT
BOWL SMART MIX CTS (DISPOSABLE) ×3 IMPLANT
CAP KNEE TOTAL 3 SIGMA ×3 IMPLANT
CEMENT HV SMART SET (Cement) ×6 IMPLANT
COVER SURGICAL LIGHT HANDLE (MISCELLANEOUS) ×3 IMPLANT
CUFF TOURN SGL QUICK 34 (TOURNIQUET CUFF) ×2
CUFF TRNQT CYL 34X4X40X1 (TOURNIQUET CUFF) ×1 IMPLANT
DECANTER SPIKE VIAL GLASS SM (MISCELLANEOUS) ×3 IMPLANT
DERMABOND ADVANCED (GAUZE/BANDAGES/DRESSINGS) ×2
DERMABOND ADVANCED .7 DNX12 (GAUZE/BANDAGES/DRESSINGS) ×1 IMPLANT
DRAPE TOP 10253 STERILE (DRAPES) IMPLANT
DRAPE U-SHAPE 47X51 STRL (DRAPES) ×3 IMPLANT
DRSG AQUACEL AG ADV 3.5X10 (GAUZE/BANDAGES/DRESSINGS) ×3 IMPLANT
DRSG TEGADERM 4X4.75 (GAUZE/BANDAGES/DRESSINGS) ×3 IMPLANT
DURAPREP 26ML APPLICATOR (WOUND CARE) ×6 IMPLANT
ELECT REM PT RETURN 15FT ADLT (MISCELLANEOUS) ×3 IMPLANT
EVACUATOR 1/8 PVC DRAIN (DRAIN) ×3 IMPLANT
GAUZE SPONGE 2X2 8PLY STRL LF (GAUZE/BANDAGES/DRESSINGS) ×1 IMPLANT
GLOVE BIO SURGEON STRL SZ7.5 (GLOVE) ×6 IMPLANT
GLOVE BIOGEL PI IND STRL 8 (GLOVE) ×2 IMPLANT
GLOVE BIOGEL PI INDICATOR 8 (GLOVE) ×4
GLOVE ECLIPSE 8.0 STRL XLNG CF (GLOVE) ×6 IMPLANT
GLOVE SURG ORTHO 9.0 STRL STRW (GLOVE) ×3 IMPLANT
GLOVE SURG SS PI 7.5 STRL IVOR (GLOVE) IMPLANT
GOWN STRL REUS W/TWL XL LVL3 (GOWN DISPOSABLE) ×6 IMPLANT
HANDPIECE INTERPULSE COAX TIP (DISPOSABLE) ×2
IMMOBILIZER KNEE 20 (SOFTGOODS) ×3
IMMOBILIZER KNEE 20 THIGH 36 (SOFTGOODS) ×1 IMPLANT
NS IRRIG 1000ML POUR BTL (IV SOLUTION) ×3 IMPLANT
PACK TOTAL KNEE CUSTOM (KITS) ×3 IMPLANT
POSITIONER SURGICAL ARM (MISCELLANEOUS) ×3 IMPLANT
SET HNDPC FAN SPRY TIP SCT (DISPOSABLE) ×1 IMPLANT
SET PAD KNEE POSITIONER (MISCELLANEOUS) ×3 IMPLANT
SPONGE GAUZE 2X2 STER 10/PKG (GAUZE/BANDAGES/DRESSINGS) ×2
SPONGE LAP 18X18 RF (DISPOSABLE) IMPLANT
SPONGE SURGIFOAM ABS GEL 100 (HEMOSTASIS) ×3 IMPLANT
STOCKINETTE 6  STRL (DRAPES) ×2
STOCKINETTE 6 STRL (DRAPES) ×1 IMPLANT
SUCTION FRAZIER HANDLE 12FR (TUBING)
SUCTION TUBE FRAZIER 12FR DISP (TUBING) IMPLANT
SUT BONE WAX W31G (SUTURE) IMPLANT
SUT MNCRL AB 3-0 PS2 18 (SUTURE) ×3 IMPLANT
SUT VIC AB 1 CT1 27 (SUTURE) ×8
SUT VIC AB 1 CT1 27XBRD ANTBC (SUTURE) ×4 IMPLANT
SUT VIC AB 2-0 CT1 27 (SUTURE) ×4
SUT VIC AB 2-0 CT1 TAPERPNT 27 (SUTURE) ×2 IMPLANT
SUT VLOC 180 0 24IN GS25 (SUTURE) ×3 IMPLANT
SYR 50ML LL SCALE MARK (SYRINGE) IMPLANT
TAPE STRIPS DRAPE STRL (GAUZE/BANDAGES/DRESSINGS) ×3 IMPLANT
TRAY FOLEY W/METER SILVER 16FR (SET/KITS/TRAYS/PACK) IMPLANT
WATER STERILE IRR 1000ML POUR (IV SOLUTION) ×6 IMPLANT
WRAP KNEE MAXI GEL POST OP (GAUZE/BANDAGES/DRESSINGS) ×3 IMPLANT
YANKAUER SUCT BULB TIP 10FT TU (MISCELLANEOUS) ×3 IMPLANT

## 2018-03-31 NOTE — Anesthesia Preprocedure Evaluation (Signed)
Anesthesia Evaluation  Patient identified by MRN, date of birth, ID band Patient awake    Reviewed: Allergy & Precautions, NPO status , Patient's Chart, lab work & pertinent test results, reviewed documented beta blocker date and time   Airway Mallampati: II  TM Distance: >3 FB Neck ROM: Full    Dental  (+) Teeth Intact, Dental Advisory Given   Pulmonary Current Smoker,    Pulmonary exam normal breath sounds clear to auscultation       Cardiovascular hypertension, Pt. on home beta blockers and Pt. on medications Normal cardiovascular exam Rhythm:Regular Rate:Normal     Neuro/Psych negative neurological ROS  negative psych ROS   GI/Hepatic negative GI ROS, Neg liver ROS,   Endo/Other  negative endocrine ROS  Renal/GU negative Renal ROS     Musculoskeletal  (+) Arthritis , Osteoarthritis,  Left knee osteoarthritis   Abdominal   Peds  Hematology negative hematology ROS (+) Plt 216k   Anesthesia Other Findings Day of surgery medications reviewed with the patient.  Reproductive/Obstetrics                             Anesthesia Physical Anesthesia Plan  ASA: II  Anesthesia Plan: Spinal   Post-op Pain Management:  Regional for Post-op pain   Induction: Intravenous  PONV Risk Score and Plan: 1 and Propofol infusion, Dexamethasone and Ondansetron  Airway Management Planned: Simple Face Mask  Additional Equipment:   Intra-op Plan:   Post-operative Plan:   Informed Consent: I have reviewed the patients History and Physical, chart, labs and discussed the procedure including the risks, benefits and alternatives for the proposed anesthesia with the patient or authorized representative who has indicated his/her understanding and acceptance.   Dental advisory given  Plan Discussed with: CRNA, Anesthesiologist and Surgeon  Anesthesia Plan Comments: (Adductor canal nerve block plus spinal)         Anesthesia Quick Evaluation

## 2018-03-31 NOTE — Interval H&P Note (Signed)
History and Physical Interval Note:  03/31/2018 1:49 PM  [name]  has presented today for surgery, with the diagnosis of Left knee osteoarthritis  The various methods of treatment have been discussed with the patient and family. After consideration of risks, benefits and other options for treatment, the patient has consented to  Procedure(s): LEFT TOTAL KNEE ARTHROPLASTY (Left) as a surgical intervention .  The patient's history has been reviewed, patient examined, no change in status, stable for surgery.  I have reviewed the patient's chart and labs.  Questions were answered to the patient's satisfaction.     Stephanie Newton ANDREW

## 2018-03-31 NOTE — Anesthesia Procedure Notes (Signed)
Procedure Name: MAC Date/Time: 03/31/2018 4:30 PM Performed by: Lissa Morales, CRNA Pre-anesthesia Checklist: Patient identified, Emergency Drugs available, Suction available, Patient being monitored and Timeout performed Patient Re-evaluated:Patient Re-evaluated prior to induction Oxygen Delivery Method: Simple face mask Placement Confirmation: positive ETCO2

## 2018-03-31 NOTE — Anesthesia Procedure Notes (Signed)
Spinal  Patient location during procedure: OR Start time: 03/31/2018 4:38 PM End time: 03/31/2018 4:43 PM Staffing Anesthesiologist: Catalina Gravel, MD Performed: anesthesiologist  Preanesthetic Checklist Completed: patient identified, surgical consent, pre-op evaluation, timeout performed, IV checked, risks and benefits discussed and monitors and equipment checked Spinal Block Patient position: sitting Prep: site prepped and draped and DuraPrep Patient monitoring: continuous pulse ox and blood pressure Approach: midline Location: L3-4 Injection technique: single-shot Needle Needle type: Quincke  Needle gauge: 22 G Assessment Sensory level: T8 Additional Notes Functioning IV was confirmed and monitors were applied. Sterile prep and drape, including hand hygiene, mask and sterile gloves were used. The patient was positioned and the spine was prepped. The skin was anesthetized with lidocaine.  Free flow of clear CSF was obtained prior to injecting local anesthetic into the CSF.  The spinal needle aspirated freely following injection.  The needle was carefully withdrawn.  The patient tolerated the procedure well. Consent was obtained prior to procedure with all questions answered and concerns addressed. Risks including but not limited to bleeding, infection, nerve damage, paralysis, failed block, inadequate analgesia, allergic reaction, high spinal, itching and headache were discussed and the patient wished to proceed.   Attempt x2 by CRNA.  Attempt x1 by MDA.  Hoy Morn, MD

## 2018-03-31 NOTE — Op Note (Signed)
DATE OF SURGERY:  03/31/2018  TIME: 6:15 PM  PATIENT NAME:  Stephanie Newton    AGE: 77 y.o.   PRE-OPERATIVE DIAGNOSIS:  Left knee osteoarthritis  POST-OPERATIVE DIAGNOSIS:  Left knee osteoarthritis  PROCEDURE:  Procedure(s): LEFT TOTAL KNEE ARTHROPLASTY  SURGEON:  Beyonka Pitney ANDREW  ASSISTANT:  Bryson Stilwell, PA-C, present and scrubbed throughout the case, critical for assistance with exposure, retraction, instrumentation, and closure.  OPERATIVE IMPLANTS: Depuy PFC Sigma Rotating Platform.  Femur size 3, Tibia size 3, Patella size 32 3-peg oval button, with a 10 mm polyethylene insert.   PREOPERATIVE INDICATIONS:   Stephanie Newton is a 81 y.o. year old female with end stage bone on bone arthritis of the knee who failed conservative treatment and elected for Total Knee Arthroplasty.   The risks, benefits, and alternatives were discussed at length including but not limited to the risks of infection, bleeding, nerve injury, stiffness, blood clots, the need for revision surgery, cardiopulmonary complications, among others, and they were willing to proceed.  OPERATIVE DESCRIPTION:  The patient was brought to the operative room and placed in a supine position.  Spinal anesthesia was administered.  IV antibiotics were given.  The lower extremity was prepped and draped in the usual sterile fashion.  Time out was performed.  The leg was elevated and exsanguinated and the tourniquet was inflated.  Anterior quadriceps tendon splitting approach was performed.  The patella was retracted and osteophytes were removed.  The anterior horn of the medial and lateral meniscus was removed and cruciate ligaments resected.   The distal femur was opened with the drill and the intramedullary distal femoral cutting jig was utilized, set at 5 degrees resecting 10 mm off the distal femur.  Care was taken to protect the collateral ligaments.  The distal femoral sizing jig was applied, taking care  to avoid notching.  Then the 4-in-1 cutting jig was applied and the anterior and posterior femur was cut, along with the chamfer cuts.    Then the extramedullary tibial cutting jig was utilized making the appropriate cut using the anterior tibial crest as a reference building in appropriate posterior slope.  Care was taken during the cut to protect the medial and collateral ligaments.  The proximal tibia was removed along with the posterior horns of the menisci.   The posterior medial femoral osteophytes and posterior lateral femoral osteophytes were removed.    The flexion gap was then measured and was symmetric with the extension gap, measured at 10.  I completed the distal femoral preparation using the appropriate jig to prepare the box.  The patella was then measured, and cut with the saw.    The proximal tibia sized and prepared accordingly with the reamer and the punch, and then all components were trialed with the trial insert.  The knee was found to have excellent balance and full motion.    The above named components were then cemented into place and all excess cement was removed.  The trial polyethylene component was in place during cementation, and then was exchanged for the real polyethylene component.    The knee was easily taken through a range of motion and the patella tracked well and the knee irrigated copiously and the parapatellar and subcutaneous tissue closed with vicryl, and monocryl with steri strips for the skin.  The arthrotomy was closed at 90 of flexion. The wounds were dressed with sterile gauze and the tourniquet released and the patient was awakened and returned to the PACU  in stable and satisfactory condition.  There were no complications.  Total tourniquet time was 78 minutes.

## 2018-03-31 NOTE — Progress Notes (Signed)
AssistedDr. Turk with left, ultrasound guided, adductor canal block. Side rails up, monitors on throughout procedure. See vital signs in flow sheet. Tolerated Procedure well.  

## 2018-03-31 NOTE — Anesthesia Procedure Notes (Signed)
Anesthesia Regional Block: Adductor canal block   Pre-Anesthetic Checklist: ,, timeout performed, Correct Patient, Correct Site, Correct Laterality, Correct Procedure, Correct Position, site marked, Risks and benefits discussed,  Surgical consent,  Pre-op evaluation,  At surgeon's request and post-op pain management  Laterality: Left  Prep: chloraprep       Needles:  Injection technique: Single-shot  Needle Type: Echogenic Needle     Needle Length: 9cm  Needle Gauge: 21     Additional Needles:   Procedures:,,,, ultrasound used (permanent image in chart),,,,  Narrative:  Start time: 03/31/2018 3:54 PM End time: 03/31/2018 3:59 PM Injection made incrementally with aspirations every 5 mL.  Performed by: Personally  Anesthesiologist: Catalina Gravel, MD  Additional Notes: No pain on injection. No increased resistance to injection. Injection made in 5cc increments.  Good needle visualization.  Patient tolerated procedure well.

## 2018-03-31 NOTE — Anesthesia Postprocedure Evaluation (Signed)
Anesthesia Post Note  Patient: Stephanie Newton  Procedure(s) Performed: LEFT TOTAL KNEE ARTHROPLASTY (Left Knee)     Patient location during evaluation: PACU Anesthesia Type: Spinal Level of consciousness: oriented and awake and alert Pain management: pain level controlled Vital Signs Assessment: post-procedure vital signs reviewed and stable Respiratory status: spontaneous breathing, respiratory function stable and nonlabored ventilation Cardiovascular status: blood pressure returned to baseline and stable Postop Assessment: no headache, no backache, no apparent nausea or vomiting, patient able to bend at knees and spinal receding Anesthetic complications: no    Last Vitals:  Vitals:   03/31/18 1915 03/31/18 1943  BP: (!) 132/93 (!) 170/80  Pulse: 71 (!) 56  Resp: 18 16  Temp: (!) 36.4 C 36.4 C  SpO2: 100% 100%    Last Pain:  Vitals:   03/31/18 1915  TempSrc:   PainSc: 0-No pain                 Catalina Gravel

## 2018-03-31 NOTE — Transfer of Care (Signed)
Immediate Anesthesia Transfer of Care Note  Patient: Stephanie Newton  Procedure(s) Performed: LEFT TOTAL KNEE ARTHROPLASTY (Left Knee)  Patient Location: PACU  Anesthesia Type:General  Level of Consciousness: awake, alert , oriented and patient cooperative  Airway & Oxygen Therapy: Patient Spontanous Breathing and Patient connected to face mask oxygen  Post-op Assessment: Report given to RN and Post -op Vital signs reviewed and stable  Post vital signs: stable  Last Vitals:  Vitals Value Taken Time  BP 135/74 03/31/2018  6:37 PM  Temp    Pulse 68 03/31/2018  6:40 PM  Resp 14 03/31/2018  6:40 PM  SpO2 100 % 03/31/2018  6:40 PM  Vitals shown include unvalidated device data.  Last Pain:  Vitals:   03/31/18 1351  TempSrc:   PainSc: 0-No pain         Complications: No apparent anesthesia complications

## 2018-04-01 LAB — CBC
HEMATOCRIT: 39.2 % (ref 36.0–46.0)
Hemoglobin: 13.4 g/dL (ref 12.0–15.0)
MCH: 30.3 pg (ref 26.0–34.0)
MCHC: 34.2 g/dL (ref 30.0–36.0)
MCV: 88.7 fL (ref 78.0–100.0)
Platelets: 214 10*3/uL (ref 150–400)
RBC: 4.42 MIL/uL (ref 3.87–5.11)
RDW: 13.5 % (ref 11.5–15.5)
WBC: 9 10*3/uL (ref 4.0–10.5)

## 2018-04-01 LAB — BASIC METABOLIC PANEL
ANION GAP: 9 (ref 5–15)
BUN: 15 mg/dL (ref 6–20)
CALCIUM: 8.7 mg/dL — AB (ref 8.9–10.3)
CHLORIDE: 109 mmol/L (ref 101–111)
CO2: 20 mmol/L — AB (ref 22–32)
CREATININE: 0.73 mg/dL (ref 0.44–1.00)
GFR calc Af Amer: >60 mL/min (ref >60)
GFR calc non Af Amer: >60 mL/min (ref >60)
Glucose, Bld: 145 mg/dL — ABNORMAL HIGH (ref 65–99)
Potassium: 4.1 mmol/L (ref 3.5–5.1)
Sodium: 138 mmol/L (ref 135–145)

## 2018-04-01 NOTE — Evaluation (Signed)
Physical Therapy Evaluation Patient Details Name: Stephanie Newton MRN: [id number] DOB: 08-01-1941 Today's Date: 04/01/2018   History of Present Illness  Pt s/p L TKR  Clinical Impression  Pt s/p L TKR and presents with decreased L LE strength/ROM and post op pain limiting functional mobility.  Pt should progress well to dc home with family assist and plans follow up OP PT starting 04/04/18    Follow Up Recommendations Follow surgeon's recommendation for DC plan and follow-up therapies    Equipment Recommendations       Recommendations for Other Services OT consult     Precautions / Restrictions Precautions Precautions: Knee;Fall Required Braces or Orthoses: Knee Immobilizer - Left Knee Immobilizer - Left: Discontinue once straight leg raise with < 10 degree lag(Pt performed IND SLR this am) Restrictions Weight Bearing Restrictions: No Other Position/Activity Restrictions: WBAT      Mobility  Bed Mobility Overal bed mobility: Needs Assistance Bed Mobility: Supine to Sit     Supine to sit: Min assist     General bed mobility comments: cues for sequence and use of L LE to self assist  Transfers Overall transfer level: Needs assistance   Transfers: Sit to/from Stand Sit to Stand: Min assist         General transfer comment: cues for LE management and use of UEs to self assist  Ambulation/Gait Ambulation/Gait assistance: Min assist Ambulation Distance (Feet): 90 Feet Assistive device: Rolling walker (2 wheeled) Gait Pattern/deviations: Step-to pattern;Step-through pattern;Decreased step length - right;Decreased step length - left;Shuffle;Decreased stance time - left;Trunk flexed Gait velocity: decr   General Gait Details: cues for posture, position from RW and initial sequence  Stairs            Wheelchair Mobility    Modified Rankin (Stroke Patients Only)       Balance                                             Pertinent  Vitals/Pain Pain Assessment: 0-10 Pain Score: 4  Pain Location: L knee Pain Descriptors / Indicators: Aching;Sore Pain Intervention(s): Limited activity within patient's tolerance;Monitored during session;Premedicated before session;Ice applied    Home Living Family/patient expects to be discharged to:: Private residence Living Arrangements: Spouse/significant other Available Help at Discharge: Family Type of Home: House Home Access: Stairs to enter Entrance Stairs-Rails: Right Entrance Stairs-Number of Steps: 3 Home Layout: One level Home Equipment: Environmental consultant - 2 wheels      Prior Function Level of Independence: Independent               Hand Dominance        Extremity/Trunk Assessment   Upper Extremity Assessment Upper Extremity Assessment: Overall WFL for tasks assessed    Lower Extremity Assessment Lower Extremity Assessment: LLE deficits/detail LLE Deficits / Details: 3/5 quads with IND SLR; AAROM at knee -10 - 80    Cervical / Trunk Assessment Cervical / Trunk Assessment: Normal  Communication   Communication: No difficulties  Cognition Arousal/Alertness: Awake/alert Behavior During Therapy: WFL for tasks assessed/performed Overall Cognitive Status: Within Functional Limits for tasks assessed                                        General Comments      Exercises  Total Joint Exercises Ankle Circles/Pumps: AROM;Both;15 reps;Supine Quad Sets: AROM;Both;10 reps;Supine Heel Slides: AAROM;Left;15 reps;Supine Straight Leg Raises: AAROM;AROM;Left;10 reps;Supine   Assessment/Plan    PT Assessment Patient needs continued PT services  PT Problem List Decreased strength;Decreased range of motion;Decreased activity tolerance;Decreased balance;Decreased mobility;Decreased cognition;Pain;Decreased knowledge of use of DME       PT Treatment Interventions DME instruction;Gait training;Stair training;Functional mobility training;Therapeutic  activities;Therapeutic exercise;Patient/family education    PT Goals (Current goals can be found in the Care Plan section)  Acute Rehab PT Goals Patient Stated Goal: Regain IND  PT Goal Formulation: With patient Time For Goal Achievement: 04/08/18 Potential to Achieve Goals: Good    Frequency 7X/week   Barriers to discharge        Co-evaluation               AM-PAC PT "6 Clicks" Daily Activity  Outcome Measure Difficulty turning over in bed (including adjusting bedclothes, sheets and blankets)?: A Lot Difficulty moving from lying on back to sitting on the side of the bed? : A Lot Difficulty sitting down on and standing up from a chair with arms (e.g., wheelchair, bedside commode, etc,.)?: A Lot Help needed moving to and from a bed to chair (including a wheelchair)?: A Little Help needed walking in hospital room?: A Little Help needed climbing 3-5 steps with a railing? : A Little 6 Click Score: 15    End of Session Equipment Utilized During Treatment: Gait belt Activity Tolerance: Patient tolerated treatment well Patient left: in chair;with call bell/phone within reach Nurse Communication: Mobility status PT Visit Diagnosis: Difficulty in walking, not elsewhere classified (R26.2)    Time: 2423-5361 PT Time Calculation (min) (ACUTE ONLY): 30 min   Charges:   PT Evaluation $PT Eval Low Complexity: 1 Low PT Treatments $Therapeutic Exercise: 8-22 mins   PT G Codes:        Pg [phone]   Suprina Mandeville 04/01/2018, 12:15 PM

## 2018-04-01 NOTE — Progress Notes (Signed)
   Subjective:  Patient reports pain as mild to moderate.  Overall doing well.  She is been up with physical therapy this morning and walked the halls.  She denies any numbness or tingling.  She denies shortness of breath or chest pain.  Objective:   VITALS:   Vitals:   03/31/18 2230 03/31/18 2345 04/01/18 0401 04/01/18 0929  BP: 139/80 (!) 146/65 129/62 117/65  Pulse: 65 68 70 65  Resp: 16 16 16  (!) 28  Temp: 97.6 F (36.4 C) (!) 97.5 F (36.4 C) 98 F (36.7 C) 97.8 F (36.6 C)  TempSrc: Oral Oral Oral Oral  SpO2: 99% 100% 100% 99%  Weight:      Height:        Neurologically intact Neurovascular intact Sensation intact distally Intact pulses distally Dorsiflexion/Plantar flexion intact Incision: dressing C/D/I and Hemovac drain pulled. ,   Lab Results  Component Value Date   WBC 9.0 04/01/2018   HGB 13.4 04/01/2018   HCT 39.2 04/01/2018   MCV 88.7 04/01/2018   PLT 214 04/01/2018   BMET    Component Value Date/Time   NA 138 04/01/2018 0511   K 4.1 04/01/2018 0511   CL 109 04/01/2018 0511   CO2 20 (L) 04/01/2018 0511   GLUCOSE 145 (H) 04/01/2018 0511   BUN 15 04/01/2018 0511   CREATININE 0.73 04/01/2018 0511   CALCIUM 8.7 (L) 04/01/2018 0511   GFRNONAA >60 04/01/2018 0511   GFRAA >60 04/01/2018 0511     Assessment/Plan: 1 Day Post-Op   Active Problems:   Primary osteoarthritis of knee   S/P knee replacement   Advance diet Up with therapy D/C IV fluids Lovenox with SCDs for DVT prophylaxis inpatient.  Okay for weightbearing as tolerated. Anticipate discharge home tomorrow.   Nicholes Stairs 04/01/2018, 10:10 AM   Geralynn Rile, MD [phone]

## 2018-04-01 NOTE — Progress Notes (Signed)
Physical Therapy Treatment Patient Details Name: Stephanie Newton MRN: [id number] DOB: Jun 10, 1941 Today's Date: 04/01/2018    History of Present Illness Pt s/p L TKR    PT Comments    Pt continues motivated and progressing with PT but with increased pain this pm.   Follow Up Recommendations  Follow surgeon's recommendation for DC plan and follow-up therapies     Equipment Recommendations  None recommended by PT(Pt declines BSC)    Recommendations for Other Services OT consult     Precautions / Restrictions Precautions Precautions: Knee;Fall Required Braces or Orthoses: Knee Immobilizer - Left Knee Immobilizer - Left: Discontinue once straight leg raise with < 10 degree lag(Pt performed IND SLR this am) Restrictions Weight Bearing Restrictions: No Other Position/Activity Restrictions: WBAT    Mobility  Bed Mobility Overal bed mobility: Needs Assistance Bed Mobility: Supine to Sit;Sit to Supine     Supine to sit: Min guard Sit to supine: Min guard   General bed mobility comments: cues for sequence and use of L LE to self assist  Transfers Overall transfer level: Needs assistance Equipment used: Rolling walker (2 wheeled) Transfers: Sit to/from Stand Sit to Stand: Min guard         General transfer comment: cues for LE management and use of UEs to self assist  Ambulation/Gait Ambulation/Gait assistance: Min guard Ambulation Distance (Feet): 150 Feet Assistive device: Rolling walker (2 wheeled) Gait Pattern/deviations: Step-to pattern;Step-through pattern;Decreased step length - right;Decreased step length - left;Shuffle;Decreased stance time - left;Trunk flexed Gait velocity: decr   General Gait Details: cues for posture, position from RW and sequence   Stairs             Wheelchair Mobility    Modified Rankin (Stroke Patients Only)       Balance                                            Cognition Arousal/Alertness:  Awake/alert Behavior During Therapy: WFL for tasks assessed/performed Overall Cognitive Status: Within Functional Limits for tasks assessed                                        Exercises Total Joint Exercises Ankle Circles/Pumps: AROM;Both;15 reps;Supine Quad Sets: AROM;Both;10 reps;Supine Heel Slides: AAROM;Left;15 reps;Supine Straight Leg Raises: AAROM;AROM;Left;10 reps;Supine    General Comments        Pertinent Vitals/Pain Pain Assessment: 0-10 Pain Score: 6  Pain Location: L knee Pain Descriptors / Indicators: Aching;Sore Pain Intervention(s): Limited activity within patient's tolerance;Monitored during session;Premedicated before session;Ice applied    Home Living Family/patient expects to be discharged to:: Private residence Living Arrangements: Spouse/significant other Available Help at Discharge: Family Type of Home: House Home Access: Stairs to enter Entrance Stairs-Rails: Right Home Layout: One level Home Equipment: Environmental consultant - 2 wheels      Prior Function Level of Independence: Independent          PT Goals (current goals can now be found in the care plan section) Acute Rehab PT Goals Patient Stated Goal: Regain IND  PT Goal Formulation: With patient Time For Goal Achievement: 04/08/18 Potential to Achieve Goals: Good Progress towards PT goals: Progressing toward goals    Frequency    7X/week      PT Plan Current plan remains  appropriate    Co-evaluation              AM-PAC PT "6 Clicks" Daily Activity  Outcome Measure  Difficulty turning over in bed (including adjusting bedclothes, sheets and blankets)?: A Lot Difficulty moving from lying on back to sitting on the side of the bed? : A Lot Difficulty sitting down on and standing up from a chair with arms (e.g., wheelchair, bedside commode, etc,.)?: A Lot Help needed moving to and from a bed to chair (including a wheelchair)?: A Little Help needed walking in hospital  room?: A Little Help needed climbing 3-5 steps with a railing? : A Little 6 Click Score: 15    End of Session Equipment Utilized During Treatment: Gait belt Activity Tolerance: Patient tolerated treatment well Patient left: in bed;with call bell/phone within reach;with family/visitor present Nurse Communication: Mobility status PT Visit Diagnosis: Difficulty in walking, not elsewhere classified (R26.2)     Time: 1314-1330 PT Time Calculation (min) (ACUTE ONLY): 16 min  Charges:  $Gait Training: 8-22 mins $Therapeutic Exercise: 8-22 mins                    G Codes:       Pg [phone]    Zenya Hickam 04/01/2018, 2:03 PM

## 2018-04-02 LAB — CBC
HEMATOCRIT: 35.5 % — AB (ref 36.0–46.0)
Hemoglobin: 11.8 g/dL — ABNORMAL LOW (ref 12.0–15.0)
MCH: 29.7 pg (ref 26.0–34.0)
MCHC: 33.2 g/dL (ref 30.0–36.0)
MCV: 89.4 fL (ref 78.0–100.0)
Platelets: 204 10*3/uL (ref 150–400)
RBC: 3.97 MIL/uL (ref 3.87–5.11)
RDW: 13.9 % (ref 11.5–15.5)
WBC: 12 10*3/uL — ABNORMAL HIGH (ref 4.0–10.5)

## 2018-04-02 NOTE — Care Management Note (Signed)
Case Management Note  Patient Details  Name: Stephanie Newton MRN: [id number] Date of Birth: 18-Nov-1941  Subjective/Objective:     Left TKA               Action/Plan: NCM spoke to pt and she is having issues with pain control. She is scheduled for OPPT on Tues. She wants HHPT. NCM contacted attending. HHPT ordered. Offered choice for HHPT, pt requested [facility]. States her brother had [facility] in the past. She has RW at home. Declined 3n1 bedside commode. Husband and children at home to provided support. Contacted [facility] with new referral.    Expected Discharge Date:  04/02/18               Expected Discharge Plan:  [place]  In-House Referral:  NA  Discharge planning Services  CM Consult  Post Acute Care Choice:  Home Health Choice offered to:  Patient  DME Arranged:  N/A DME Agency:  NA  HH Arranged:  PT [place] Agency:  [facility] (formerly [employer])  Status of Service:  Completed, signed off  If discussed at [employer], dates discussed:    Additional Comments:  Erenest Rasher, RN 04/02/2018, 9:27 AM

## 2018-04-02 NOTE — Progress Notes (Signed)
    Subjective: 2 Days Post-Op Procedure(s) (LRB): LEFT TOTAL KNEE ARTHROPLASTY (Left) Patient reports pain as 5 on 0-10 scale.   Denies CP or SOB.  Voiding without difficulty. Positive flatus.  Pt reports edema last evening that was concerning. She reports having family at home for assistance.  Objective: Vital signs in last 24 hours: Temp:  [97.5 F (36.4 C)-98.1 F (36.7 C)] 98.1 F (36.7 C) (04/28 0511) Pulse Rate:  [64-71] 67 (04/28 0511) Resp:  [15-28] 18 (04/28 0511) BP: (117-156)/(65-107) 156/80 (04/28 0511) SpO2:  [98 %-100 %] 98 % (04/28 0511)  Intake/Output from previous day: 04/27 0701 - 04/28 0700 In: 1380 [P.O.:480; I.V.:900] Out: 250 [Urine:250] Intake/Output this shift: No intake/output data recorded.  Labs: Recent Labs    04/01/18 0511 04/02/18 0528  HGB 13.4 11.8*   Recent Labs    04/01/18 0511 04/02/18 0528  WBC 9.0 12.0*  RBC 4.42 3.97  HCT 39.2 35.5*  PLT 214 204   Recent Labs    04/01/18 0511  NA 138  K 4.1  CL 109  CO2 20*  BUN 15  CREATININE 0.73  GLUCOSE 145*  CALCIUM 8.7*   No results for input(s): LABPT, INR in the last 72 hours.  Physical Exam: Neurologically intact ABD soft Sensation intact distally Dorsiflexion/Plantar flexion intact Incision: no drainage Compartment soft Body mass index is 25.58 kg/m.   Assessment/Plan: 2 Days Post-Op Procedure(s) (LRB): LEFT TOTAL KNEE ARTHROPLASTY (Left) Advance diet Up with therapy  Pt may DC home after cleared by PT  Derhonda Eastlick, Darla Lesches for Dr. Melina Schools [facility] [phone] 04/02/2018, 7:41 AM

## 2018-04-02 NOTE — Discharge Instructions (Signed)
Total Knee Replacement, Care After Refer to this sheet in the next few weeks. These instructions provide you with information about caring for yourself after your procedure. Your health care provider may also give you more specific instructions. Your treatment has been planned according to current medical practices, but problems sometimes occur. Call your health care provider if you have any problems or questions after your procedure. What can I expect after the procedure? After the procedure, it is common to have:  Pain and swelling.  A small amount of blood or clear fluid coming from your incision.  Limited range of motion.  Follow these instructions at home: Medicines  Take over-the-counter and prescription medicines only as told by your health care provider.  If you were prescribed an antibiotic medicine, take it as told by your health care provider. Do not stop taking the antibiotic even if you start to feel better.  If you were prescribed a blood thinner (anticoagulant), take it as told by your health care provider. If you have a splint or brace:  Wear the immobilizer as told by your health care provider. Remove it only as told by your health care provider.  Loosen the immobilizer if your toes tingle, become numb, or turn cold and blue.  Do not let your immobilizer get wet if it is not waterproof.  Keep the immobilizer clean. Bathing   Do not take baths, swim, or use a hot tub until your health care provider approves. Ask your health care provider if you can take showers. You may only be allowed to take sponge baths for bathing.  If you have an immobilizer that is not waterproof, cover it with a watertight covering when you take a bath or shower.  Keep your bandage (dressing) dry until your health care provider says it can be removed. Incision care and drain care  Check your incision area and drain site every day for signs of infection. Check for: ? More redness,  swelling, or pain. ? More fluid or blood. ? Warmth. ? Pus or a bad smell.  Follow instructions from your health care provider about how to take care of your incision. Make sure you: ? Wash your hands with soap and water before you change your dressing. If soap and water are not available, use hand sanitizer. ? Change your dressing as told by your health care provider. ? Leave stitches (sutures), skin glue, or adhesive strips in place. These skin closures may need to stay in place for 2 weeks or longer. If adhesive strip edges start to loosen and curl up, you may trim the loose edges. Do not remove adhesive strips completely unless your health care provider tells you to do that.  If you have a drain, follow instructions from your health care provider about caring for it. Do not remove the drain tube or any dressings around the tube opening unless your health care provider approves. Managing pain, stiffness, and swelling  If directed, put ice on your knee. ? Put ice in a plastic bag. ? Place a towel between your skin and the bag. ? Leave the ice on for 20 minutes, 2-3 times per day.  If directed, apply heat to the affected area as often as told by your health care provider. Use the heat source that your health care provider recommends, such as a moist heat pack or a heating pad. ? Place a towel between your skin and the heat source. ? Leave the heat on for 20-30 minutes. ?  Remove the heat if your skin turns bright red. This is especially important if you are unable to feel pain, heat, or cold. You may have a greater risk of getting burned.  Move your toes often to avoid stiffness and to lessen swelling.  Raise (elevate) your knee above the level of your heart while you are sitting or lying down.  Wear elastic knee support for as long as told by your health care provider. Driving   Do not drive until your health care provider approves. Ask your health care provider when it is safe to  drive if you have an immobilizer on your knee.  Do not drive or operate heavy machinery while taking prescription pain medicine.  Do not drive for 24 hours if you received a sedative. Activity  Do not lift anything that is heavier than 10 lb (4.5 kg) until your health care provider approves.  Do not play contact sports until your health care provider approves.  Avoid high-impact activities, including running, jumping rope, and jumping jacks.  Avoid sitting for a long time without moving. Get up and move around at least every few hours.  If physical therapy was prescribed, do exercises as told by your health care provider.  Return to your normal activities as told by your health care provider. Ask your health care provider what activities are safe for you. Safety  Do not use your leg to support your body weight until your health care provider approves. Use crutches or a walker as told by your health care provider. General instructions   Do not have any dental work done for at least 3 months after your surgery. When you do have dental work done, tell your dentist about your joint replacement.  Do not use any tobacco products, such as cigarettes, chewing tobacco, or e-cigarettes. If you need help quitting, ask your health care provider.  Wear compression stockings as told by your health care provider. These stockings help to prevent blood clots and reduce swelling in your legs.  If you have been sent home with a continuous passive motion machine, use it as told by your health care provider.  Drink enough fluid to keep your urine clear or pale yellow.  If you have been instructed to lose weight, follow instructions from your health care provider about how to do this safely.  Keep all follow-up visits as told by your health care provider. This is important. Contact a health care provider if:  You have more redness, swelling, or pain around your incision or drain.  You have more  fluid or blood coming from your incision or drain.  Your incision or drain site feels warm to the touch.  You have pus or a bad smell coming from your incision or drain.  You have a fever.  Your incision breaks open after your health care provider removes your sutures, skin glue, or adhesive tape.  Your prosthesis feels loose.  You have knee pain that does not go away. Get help right away if:  You have a rash.  You have pain or swelling in your calf or thigh.  You have shortness of breath or difficulty breathing.  You have chest pain.  Your range of motion in your knee is getting worse. This information is not intended to replace advice given to you by your health care provider. Make sure you discuss any questions you have with your health care provider. Document Released: 06/11/2005 Document Revised: 07/26/2016 Document Reviewed: 10/29/2015 [employer] Interactive Patient  Education © 2018 [employer] ° °

## 2018-04-02 NOTE — Progress Notes (Signed)
Physical Therapy Treatment Patient Details Name: Stephanie Newton MRN: [id number] DOB: [birth year] Today's Date: 04/02/2018    History of Present Illness Pt s/p L TKR    PT Comments    Pt ambulated in hallway and practiced safe stair technique.  Pt performed exercises and provided with HEP handout.  Pt has no further questions and feels ready for d/c home today.    Follow Up Recommendations  Follow surgeon's recommendation for DC plan and follow-up therapies     Equipment Recommendations  None recommended by PT    Recommendations for Other Services       Precautions / Restrictions Precautions Precautions: Knee;Fall Required Braces or Orthoses: Knee Immobilizer - Left Restrictions Other Position/Activity Restrictions: WBAT    Mobility  Bed Mobility               General bed mobility comments: pt up in recliner on arrival  Transfers Overall transfer level: Needs assistance Equipment used: Rolling walker (2 wheeled) Transfers: Sit to/from Stand Sit to Stand: Min guard         General transfer comment: cues for LE management for pain control  Ambulation/Gait Ambulation/Gait assistance: Min guard Ambulation Distance (Feet): 160 Feet Assistive device: Rolling walker (2 wheeled) Gait Pattern/deviations: Step-to pattern;Decreased stance time - left;Antalgic Gait velocity: decr   General Gait Details: cues for posture, position from RW, and step length, distance to pt tolerance   Stairs Stairs: Yes Stairs assistance: Min guard Stair Management: Step to pattern;Forwards;Two rails Number of Stairs: 2 General stair comments: verbal cues for safety and sequence, pt reports her family can lift her up the stairs however encouraged her to perform and have family assist with unrailed side, performed once and pt felt comfortable with performance, declined performing second time   Wheelchair Mobility    Modified Rankin (Stroke Patients Only)       Balance                                             Cognition Arousal/Alertness: Awake/alert Behavior During Therapy: WFL for tasks assessed/performed Overall Cognitive Status: Within Functional Limits for tasks assessed                                        Exercises Total Joint Exercises Ankle Circles/Pumps: AROM;Both;Supine;10 reps Quad Sets: AROM;Both;10 reps;Supine Short Arc Quad: AROM;10 reps;Left Heel Slides: AAROM;Left;15 reps;Supine Hip ABduction/ADduction: AAROM;10 reps;Left Straight Leg Raises: AAROM;Left;10 reps;Supine    General Comments        Pertinent Vitals/Pain Pain Assessment: 0-10 Pain Score: 7  Pain Location: L knee/thigh Pain Descriptors / Indicators: Aching;Sore Pain Intervention(s): Limited activity within patient's tolerance;Repositioned;Monitored during session;Premedicated before session;Ice applied    Home Living                      Prior Function            PT Goals (current goals can now be found in the care plan section) Progress towards PT goals: Progressing toward goals    Frequency    7X/week      PT Plan Current plan remains appropriate    Co-evaluation              AM-PAC PT "6 Clicks" Daily Activity  Outcome  Measure  Difficulty turning over in bed (including adjusting bedclothes, sheets and blankets)?: A Little Difficulty moving from lying on back to sitting on the side of the bed? : A Little Difficulty sitting down on and standing up from a chair with arms (e.g., wheelchair, bedside commode, etc,.)?: A Little Help needed moving to and from a bed to chair (including a wheelchair)?: A Little Help needed walking in hospital room?: A Little Help needed climbing 3-5 steps with a railing? : A Little 6 Click Score: 18    End of Session Equipment Utilized During Treatment: Gait belt Activity Tolerance: Patient tolerated treatment well Patient left: with call bell/phone within  reach;in chair Nurse Communication: Mobility status PT Visit Diagnosis: Difficulty in walking, not elsewhere classified (R26.2)     Time: 1561-5379 PT Time Calculation (min) (ACUTE ONLY): 24 min  Charges:  $Gait Training: 8-22 mins $Therapeutic Exercise: 8-22 mins                    G Codes:       Carmelia Bake, PT, DPT 04/02/2018 Pager: [phone]  York Ram E 04/02/2018, 12:37 PM

## 2018-04-02 NOTE — Progress Notes (Signed)
Discharged from floor via w/c for transport home by car. Belongings & family with pt. No changes in assessment. Stephanie Newton  

## 2018-04-03 ENCOUNTER — Encounter (HOSPITAL_COMMUNITY): Payer: Self-pay | Admitting: Specialist

## 2018-04-03 DIAGNOSIS — Z7982 Long term (current) use of aspirin: Secondary | ICD-10-CM | POA: Diagnosis not present

## 2018-04-03 DIAGNOSIS — I10 Essential (primary) hypertension: Secondary | ICD-10-CM | POA: Diagnosis not present

## 2018-04-03 DIAGNOSIS — E785 Hyperlipidemia, unspecified: Secondary | ICD-10-CM | POA: Diagnosis not present

## 2018-04-03 DIAGNOSIS — Z471 Aftercare following joint replacement surgery: Secondary | ICD-10-CM | POA: Diagnosis not present

## 2018-04-03 DIAGNOSIS — Z96652 Presence of left artificial knee joint: Secondary | ICD-10-CM | POA: Diagnosis not present

## 2018-04-03 NOTE — Discharge Summary (Signed)
Physician Discharge Summary  Patient ID: Stephanie Newton MRN: [id number] DOB/AGE: August 01, 1941 77 y.o.  Admit date: 03/31/2018 Discharge date: 02/02/18  Admission Diagnoses: Left knee primary OA  Discharge Diagnoses:  Active Problems:   Primary osteoarthritis of knee   S/P knee replacement   Discharged Condition: good  Hospital Course: Stephanie Newton is a 84 y.o. who was admitted to [facility]. They were brought to the operating room on 03/31/2018 and underwent Procedure(s): LEFT TOTAL KNEE ARTHROPLASTY.  Patient tolerated the procedure well and was later transferred to the recovery room and then to the orthopaedic floor for postoperative care.  They were given PO and IV analgesics for pain control following their surgery.  They were given 24 hours of postoperative antibiotics of  Anti-infectives (From admission, onward)   Start     Dose/Rate Route Frequency Ordered Stop   04/01/18 0600  ceFAZolin (ANCEF) IVPB 2g/100 mL premix     2 g 200 mL/hr over 30 Minutes Intravenous On call to O.R. 03/31/18 1331 03/31/18 1645   03/31/18 2200  ceFAZolin (ANCEF) IVPB 2g/100 mL premix     2 g 200 mL/hr over 30 Minutes Intravenous Every 6 hours 03/31/18 1946 04/01/18 0322     and started on DVT prophylaxis in the form of lovenox.   PT and OT were ordered for total joint protocol.  Discharge planning consulted to help with postop disposition and equipment needs.  Patient had a good night on the evening of surgery and started to get up OOB with therapy on day one.  Hemovac drain was pulled without difficulty.  Continued to work with therapy into day two.  Dressing was with normal limits.  The patient had progressed with therapy and meeting their goals. Patient was seen in rounds and was ready to go home.  Consults: n/a  Significant Diagnostic Studies: routine  Treatments: routine  Discharge Exam: Blood pressure (!) 156/80, pulse 67, temperature 98.1 F (36.7 C), temperature source  Oral, resp. rate 18, height 5\' 4"  (1.626 m), weight 67.6 kg (149 lb 0.5 oz), SpO2 98 %. Alert and oriented x3. RRR, Lungs clear, BS x4. Left Calf soft and non tender. L knee dressing C/D/I. No DVT signs. No signs of infection or compartment syndrome. LLE grossly neurovascularly intact.   Disposition:   Discharge Instructions    Call MD / Call 911   Complete by:  As directed    If you experience chest pain or shortness of breath, CALL 911 and be transported to the hospital emergency room.  If you develope a fever above 101 F, pus (white drainage) or increased drainage or redness at the wound, or calf pain, call your surgeon's office.   Constipation Prevention   Complete by:  As directed    Drink plenty of fluids.  Prune juice may be helpful.  You may use a stool softener, such as Colace (over the counter) 100 mg twice a day.  Use MiraLax (over the counter) for constipation as needed.   Diet - low sodium heart healthy   Complete by:  As directed    Discharge instructions   Complete by:  As directed    INSTRUCTIONS AFTER JOINT REPLACEMENT   Remove items at home which could result in a fall. This includes throw rugs or furniture in walking pathways ICE to the affected joint every three hours while awake for 30 minutes at a time, for at least the first 3-5 days, and then as needed for pain and  swelling.  Continue to use ice for pain and swelling. You may notice swelling that will progress down to the foot and ankle.  This is normal after surgery.  Elevate your leg when you are not up walking on it.   Continue to use the breathing machine you got in the hospital (incentive spirometer) which will help keep your temperature down.  It is common for your temperature to cycle up and down following surgery, especially at night when you are not up moving around and exerting yourself.  The breathing machine keeps your lungs expanded and your temperature down.   DIET:  As you were doing prior to  hospitalization, we recommend a well-balanced diet.  DRESSING / WOUND CARE / SHOWERING  Keep the surgical dressing until follow up.  The dressing is water proof, so you can shower without any extra covering.  IF THE DRESSING FALLS OFF or the wound gets wet inside, change the dressing with sterile gauze.  Please use good hand washing techniques before changing the dressing.  Do not use any lotions or creams on the incision until instructed by your surgeon.    ACTIVITY  Increase activity slowly as tolerated, but follow the weight bearing instructions below.   No driving for 6 weeks or until further direction given by your physician.  You cannot drive while taking narcotics.  No lifting or carrying greater than 10 lbs. until further directed by your surgeon. Avoid periods of inactivity such as sitting longer than an hour when not asleep. This helps prevent blood clots.  You may return to work once you are authorized by your doctor.     WEIGHT BEARING   Weight bearing as tolerated with assist device (walker, cane, etc) as directed, use it as long as suggested by your surgeon or therapist, typically at least 4-6 weeks.   EXERCISES  Results after joint replacement surgery are often greatly improved when you follow the exercise, range of motion and muscle strengthening exercises prescribed by your doctor. Safety measures are also important to protect the joint from further injury. Any time any of these exercises cause you to have increased pain or swelling, decrease what you are doing until you are comfortable again and then slowly increase them. If you have problems or questions, call your caregiver or physical therapist for advice.   Rehabilitation is important following a joint replacement. After just a few days of immobilization, the muscles of the leg can become weakened and shrink (atrophy).  These exercises are designed to build up the tone and strength of the thigh and leg muscles and to  improve motion. Often times heat used for twenty to thirty minutes before working out will loosen up your tissues and help with improving the range of motion but do not use heat for the first two weeks following surgery (sometimes heat can increase post-operative swelling).   These exercises can be done on a training (exercise) mat, on the floor, on a table or on a bed. Use whatever works the best and is most comfortable for you.    Use music or television while you are exercising so that the exercises are a pleasant break in your day. This will make your life better with the exercises acting as a break in your routine that you can look forward to.   Perform all exercises about fifteen times, three times per day or as directed.  You should exercise both the operative leg and the other leg as well.  Exercises include:   Quad Sets - Tighten up the muscle on the front of the thigh (Quad) and hold for 5-10 seconds.   Straight Leg Raises - With your knee straight (if you were given a brace, keep it on), lift the leg to 60 degrees, hold for 3 seconds, and slowly lower the leg.  Perform this exercise against resistance later as your leg gets stronger.  Leg Slides: Lying on your back, slowly slide your foot toward your buttocks, bending your knee up off the floor (only go as far as is comfortable). Then slowly slide your foot back down until your leg is flat on the floor again.  Angel Wings: Lying on your back spread your legs to the side as far apart as you can without causing discomfort.  Hamstring Strength:  Lying on your back, push your heel against the floor with your leg straight by tightening up the muscles of your buttocks.  Repeat, but this time bend your knee to a comfortable angle, and push your heel against the floor.  You may put a pillow under the heel to make it more comfortable if necessary.   A rehabilitation program following joint replacement surgery can speed recovery and prevent re-injury  in the future due to weakened muscles. Contact your doctor or a physical therapist for more information on knee rehabilitation.    CONSTIPATION  Constipation is defined medically as fewer than three stools per week and severe constipation as less than one stool per week.  Even if you have a regular bowel pattern at home, your normal regimen is likely to be disrupted due to multiple reasons following surgery.  Combination of anesthesia, postoperative narcotics, change in appetite and fluid intake all can affect your bowels.   YOU MUST use at least one of the following options; they are listed in order of increasing strength to get the job done.  They are all available over the counter, and you may need to use some, POSSIBLY even all of these options:    Drink plenty of fluids (prune juice may be helpful) and high fiber foods Colace 100 mg by mouth twice a day  Senokot for constipation as directed and as needed Dulcolax (bisacodyl), take with full glass of water  Miralax (polyethylene glycol) once or twice a day as needed.  If you have tried all these things and are unable to have a bowel movement in the first 3-4 days after surgery call either your surgeon or your primary doctor.    If you experience loose stools or diarrhea, hold the medications until you stool forms back up.  If your symptoms do not get better within 1 week or if they get worse, check with your doctor.  If you experience "the worst abdominal pain ever" or develop nausea or vomiting, please contact the office immediately for further recommendations for treatment.   ITCHING:  If you experience itching with your medications, try taking only a single pain pill, or even half a pain pill at a time.  You can also use Benadryl over the counter for itching or also to help with sleep.   TED HOSE STOCKINGS:  Use stockings on both legs until for at least 2 weeks or as directed by physician office. They may be removed at night for  sleeping.  MEDICATIONS:  See your medication summary on the "After Visit Summary" that nursing will review with you.  You may have some home medications which will be placed on hold until  you complete the course of blood thinner medication.  It is important for you to complete the blood thinner medication as prescribed.  PRECAUTIONS:  If you experience chest pain or shortness of breath - call 911 immediately for transfer to the hospital emergency department.   If you develop a fever greater that 101 F, purulent drainage from wound, increased redness or drainage from wound, foul odor from the wound/dressing, or calf pain - CONTACT YOUR SURGEON.                                                   FOLLOW-UP APPOINTMENTS:  If you do not already have a post-op appointment, please call the office for an appointment to be seen by your surgeon.  Guidelines for how soon to be seen are listed in your "After Visit Summary", but are typically between 1-4 weeks after surgery.  OTHER INSTRUCTIONS:   Knee Replacement:  Do not place pillow under knee, focus on keeping the knee straight while resting. CPM instructions: 0-90 degrees, 2 hours in the morning, 2 hours in the afternoon, and 2 hours in the evening. Place foam block, curve side up under heel at all times except when in CPM or when walking.  DO NOT modify, tear, cut, or change the foam block in any way.  MAKE SURE YOU:  Understand these instructions.  Get help right away if you are not doing well or get worse.    Thank you for letting us be a part of your medical care team.  It is a privilege we respect greatly.  We hope these instructions will help you stay on track for a fast and full recovery!   Increase activity slowly as tolerated   Complete by:  As directed      Allergies as of 04/02/2018      Reactions   Shellfish Allergy Nausea Only      Medication List    STOP taking these medications   naproxen sodium 220 MG tablet Commonly known as:   ALEVE   traMADol 50 MG tablet Commonly known as:  ULTRAM     TAKE these medications   aspirin EC 325 MG tablet Take 1 tablet (325 mg total) by mouth 2 (two) times daily.   atenolol 100 MG tablet Commonly known as:  TENORMIN Take 100 mg by mouth daily.   baclofen 10 MG tablet Commonly known as:  LIORESAL Take 1 tablet (10 mg total) by mouth 3 (three) times daily as needed for muscle spasms.   estradiol 1 MG tablet Commonly known as:  ESTRACE Take 1 mg by mouth daily.   hydrochlorothiazide 12.5 MG capsule Commonly known as:  MICROZIDE Take 12.5 mg by mouth daily.   lisinopril 10 MG tablet Commonly known as:  PRINIVIL,ZESTRIL Take 10 mg by mouth daily.   ondansetron 4 MG tablet Commonly known as:  ZOFRAN Take 1 tablet (4 mg total) by mouth every 6 (six) hours.   oxyCODONE 5 MG immediate release tablet Commonly known as:  ROXICODONE Take 1 tablet (5 mg total) by mouth every 4 (four) hours as needed for up to 7 days.   potassium chloride SA 20 MEQ tablet Commonly known as:  K-DUR,KLOR-CON Take 1 tablet (20 mEq total) by mouth 2 (two) times daily.   simvastatin 20 MG tablet Commonly known as:  ZOCOR Take  20 mg by mouth every evening.   sodium chloride 0.65 % Soln nasal spray Commonly known as:  OCEAN Place 1 spray into both nostrils as needed for congestion.      Follow-up Information    Sydnee Cabal, MD Follow up in 2 week(s).   Specialty:  Orthopedic Surgery Contact information: [address] [place] [address] [phone]        [facility] Follow up.   Specialty:  Home Health Services Why:  Home Health Physical Therapy- will call to arrange initial visit Contact information: [address] [place] [address] [phone]           Signed: Lajean Manes 04/03/2018, 7:32 AM

## 2018-04-05 DIAGNOSIS — Z7982 Long term (current) use of aspirin: Secondary | ICD-10-CM | POA: Diagnosis not present

## 2018-04-05 DIAGNOSIS — I10 Essential (primary) hypertension: Secondary | ICD-10-CM | POA: Diagnosis not present

## 2018-04-05 DIAGNOSIS — E785 Hyperlipidemia, unspecified: Secondary | ICD-10-CM | POA: Diagnosis not present

## 2018-04-05 DIAGNOSIS — Z96652 Presence of left artificial knee joint: Secondary | ICD-10-CM | POA: Diagnosis not present

## 2018-04-05 DIAGNOSIS — Z471 Aftercare following joint replacement surgery: Secondary | ICD-10-CM | POA: Diagnosis not present

## 2018-04-07 DIAGNOSIS — Z96652 Presence of left artificial knee joint: Secondary | ICD-10-CM | POA: Diagnosis not present

## 2018-04-07 DIAGNOSIS — Z7982 Long term (current) use of aspirin: Secondary | ICD-10-CM | POA: Diagnosis not present

## 2018-04-07 DIAGNOSIS — Z471 Aftercare following joint replacement surgery: Secondary | ICD-10-CM | POA: Diagnosis not present

## 2018-04-07 DIAGNOSIS — E785 Hyperlipidemia, unspecified: Secondary | ICD-10-CM | POA: Diagnosis not present

## 2018-04-07 DIAGNOSIS — I10 Essential (primary) hypertension: Secondary | ICD-10-CM | POA: Diagnosis not present

## 2018-04-10 DIAGNOSIS — Z7982 Long term (current) use of aspirin: Secondary | ICD-10-CM | POA: Diagnosis not present

## 2018-04-10 DIAGNOSIS — E785 Hyperlipidemia, unspecified: Secondary | ICD-10-CM | POA: Diagnosis not present

## 2018-04-10 DIAGNOSIS — Z96652 Presence of left artificial knee joint: Secondary | ICD-10-CM | POA: Diagnosis not present

## 2018-04-10 DIAGNOSIS — Z471 Aftercare following joint replacement surgery: Secondary | ICD-10-CM | POA: Diagnosis not present

## 2018-04-10 DIAGNOSIS — I10 Essential (primary) hypertension: Secondary | ICD-10-CM | POA: Diagnosis not present

## 2018-04-12 DIAGNOSIS — Z7982 Long term (current) use of aspirin: Secondary | ICD-10-CM | POA: Diagnosis not present

## 2018-04-12 DIAGNOSIS — Z96652 Presence of left artificial knee joint: Secondary | ICD-10-CM | POA: Diagnosis not present

## 2018-04-12 DIAGNOSIS — I10 Essential (primary) hypertension: Secondary | ICD-10-CM | POA: Diagnosis not present

## 2018-04-12 DIAGNOSIS — E785 Hyperlipidemia, unspecified: Secondary | ICD-10-CM | POA: Diagnosis not present

## 2018-04-12 DIAGNOSIS — Z471 Aftercare following joint replacement surgery: Secondary | ICD-10-CM | POA: Diagnosis not present

## 2018-04-14 DIAGNOSIS — M25562 Pain in left knee: Secondary | ICD-10-CM | POA: Diagnosis not present

## 2018-04-14 DIAGNOSIS — Z96652 Presence of left artificial knee joint: Secondary | ICD-10-CM | POA: Diagnosis not present

## 2018-04-14 DIAGNOSIS — Z471 Aftercare following joint replacement surgery: Secondary | ICD-10-CM | POA: Diagnosis not present

## 2018-04-14 DIAGNOSIS — M1712 Unilateral primary osteoarthritis, left knee: Secondary | ICD-10-CM | POA: Diagnosis not present

## 2018-04-14 DIAGNOSIS — Z4789 Encounter for other orthopedic aftercare: Secondary | ICD-10-CM | POA: Diagnosis not present

## 2018-04-19 DIAGNOSIS — M25562 Pain in left knee: Secondary | ICD-10-CM | POA: Diagnosis not present

## 2018-04-21 DIAGNOSIS — M25562 Pain in left knee: Secondary | ICD-10-CM | POA: Diagnosis not present

## 2018-04-26 DIAGNOSIS — M25562 Pain in left knee: Secondary | ICD-10-CM | POA: Diagnosis not present

## 2018-04-26 DIAGNOSIS — Z79899 Other long term (current) drug therapy: Secondary | ICD-10-CM | POA: Diagnosis not present

## 2018-04-28 DIAGNOSIS — M25562 Pain in left knee: Secondary | ICD-10-CM | POA: Diagnosis not present

## 2018-05-03 DIAGNOSIS — M25562 Pain in left knee: Secondary | ICD-10-CM | POA: Diagnosis not present

## 2018-05-05 DIAGNOSIS — M25562 Pain in left knee: Secondary | ICD-10-CM | POA: Diagnosis not present

## 2018-05-10 DIAGNOSIS — M25562 Pain in left knee: Secondary | ICD-10-CM | POA: Diagnosis not present

## 2018-05-12 DIAGNOSIS — M25562 Pain in left knee: Secondary | ICD-10-CM | POA: Diagnosis not present

## 2018-05-17 DIAGNOSIS — M25562 Pain in left knee: Secondary | ICD-10-CM | POA: Diagnosis not present

## 2018-05-23 DIAGNOSIS — Z0000 Encounter for general adult medical examination without abnormal findings: Secondary | ICD-10-CM | POA: Diagnosis not present

## 2018-05-23 DIAGNOSIS — E7800 Pure hypercholesterolemia, unspecified: Secondary | ICD-10-CM | POA: Diagnosis not present

## 2018-05-23 DIAGNOSIS — I10 Essential (primary) hypertension: Secondary | ICD-10-CM | POA: Diagnosis not present

## 2018-05-23 DIAGNOSIS — Z1389 Encounter for screening for other disorder: Secondary | ICD-10-CM | POA: Diagnosis not present

## 2018-05-23 DIAGNOSIS — M25562 Pain in left knee: Secondary | ICD-10-CM | POA: Diagnosis not present

## 2018-05-30 DIAGNOSIS — M25562 Pain in left knee: Secondary | ICD-10-CM | POA: Diagnosis not present

## 2018-06-23 DIAGNOSIS — M25561 Pain in right knee: Secondary | ICD-10-CM | POA: Diagnosis not present

## 2018-06-23 DIAGNOSIS — Z9889 Other specified postprocedural states: Secondary | ICD-10-CM | POA: Diagnosis not present

## 2018-07-04 ENCOUNTER — Other Ambulatory Visit: Payer: Self-pay | Admitting: Geriatric Medicine

## 2018-07-04 DIAGNOSIS — Z0000 Encounter for general adult medical examination without abnormal findings: Secondary | ICD-10-CM | POA: Diagnosis not present

## 2018-07-04 DIAGNOSIS — I10 Essential (primary) hypertension: Secondary | ICD-10-CM | POA: Diagnosis not present

## 2018-07-04 DIAGNOSIS — Z1231 Encounter for screening mammogram for malignant neoplasm of breast: Secondary | ICD-10-CM

## 2018-07-27 ENCOUNTER — Ambulatory Visit
Admission: RE | Admit: 2018-07-27 | Discharge: 2018-07-27 | Disposition: A | Discharge status: Home / Self Care | Payer: Medicare Other | Source: Ambulatory Visit | Attending: Geriatric Medicine | Admitting: Geriatric Medicine

## 2018-07-27 DIAGNOSIS — Z1231 Encounter for screening mammogram for malignant neoplasm of breast: Secondary | ICD-10-CM

## 2018-09-25 DIAGNOSIS — M25562 Pain in left knee: Secondary | ICD-10-CM | POA: Diagnosis not present

## 2018-10-30 DIAGNOSIS — J069 Acute upper respiratory infection, unspecified: Secondary | ICD-10-CM | POA: Diagnosis not present

## 2018-12-03 ENCOUNTER — Emergency Department (HOSPITAL_COMMUNITY): Payer: Medicare Other

## 2018-12-03 ENCOUNTER — Encounter (HOSPITAL_COMMUNITY): Payer: Self-pay

## 2018-12-03 ENCOUNTER — Observation Stay (HOSPITAL_COMMUNITY)
Admission: EM | Admit: 2018-12-03 | Discharge: 2018-12-05 | Disposition: A | Discharge status: Home / Self Care | Payer: Medicare Other | Attending: Internal Medicine | Admitting: Internal Medicine

## 2018-12-03 DIAGNOSIS — R002 Palpitations: Secondary | ICD-10-CM

## 2018-12-03 DIAGNOSIS — R0789 Other chest pain: Secondary | ICD-10-CM

## 2018-12-03 DIAGNOSIS — I2541 Coronary artery aneurysm: Principal | ICD-10-CM | POA: Insufficient documentation

## 2018-12-03 DIAGNOSIS — R7989 Other specified abnormal findings of blood chemistry: Secondary | ICD-10-CM | POA: Insufficient documentation

## 2018-12-03 DIAGNOSIS — R918 Other nonspecific abnormal finding of lung field: Secondary | ICD-10-CM | POA: Diagnosis not present

## 2018-12-03 DIAGNOSIS — I110 Hypertensive heart disease with heart failure: Secondary | ICD-10-CM | POA: Insufficient documentation

## 2018-12-03 DIAGNOSIS — Z720 Tobacco use: Secondary | ICD-10-CM

## 2018-12-03 DIAGNOSIS — E785 Hyperlipidemia, unspecified: Secondary | ICD-10-CM

## 2018-12-03 DIAGNOSIS — R778 Other specified abnormalities of plasma proteins: Secondary | ICD-10-CM

## 2018-12-03 DIAGNOSIS — I214 Non-ST elevation (NSTEMI) myocardial infarction: Secondary | ICD-10-CM | POA: Diagnosis not present

## 2018-12-03 DIAGNOSIS — F17210 Nicotine dependence, cigarettes, uncomplicated: Secondary | ICD-10-CM | POA: Insufficient documentation

## 2018-12-03 DIAGNOSIS — R911 Solitary pulmonary nodule: Secondary | ICD-10-CM

## 2018-12-03 DIAGNOSIS — Z79899 Other long term (current) drug therapy: Secondary | ICD-10-CM | POA: Diagnosis not present

## 2018-12-03 DIAGNOSIS — I10 Essential (primary) hypertension: Secondary | ICD-10-CM

## 2018-12-03 DIAGNOSIS — I5030 Unspecified diastolic (congestive) heart failure: Secondary | ICD-10-CM | POA: Diagnosis not present

## 2018-12-03 LAB — CBC
HCT: 48.2 % — ABNORMAL HIGH (ref 36.0–46.0)
Hemoglobin: 16.1 g/dL — ABNORMAL HIGH (ref 12.0–15.0)
MCH: 30.4 pg (ref 26.0–34.0)
MCHC: 33.4 g/dL (ref 30.0–36.0)
MCV: 90.9 fL (ref 80.0–100.0)
Platelets: 229 10*3/uL (ref 150–400)
RBC: 5.3 MIL/uL — ABNORMAL HIGH (ref 3.87–5.11)
RDW: 14.8 % (ref 11.5–15.5)
WBC: 6.2 10*3/uL (ref 4.0–10.5)
nRBC: 0 % (ref 0.0–0.2)

## 2018-12-03 LAB — I-STAT TROPONIN, ED
Troponin i, poc: 0.01 ng/mL (ref 0.00–0.08)
Troponin i, poc: 0.22 ng/mL (ref 0.00–0.08)

## 2018-12-03 LAB — BASIC METABOLIC PANEL
Anion gap: 15 (ref 5–15)
BUN: 11 mg/dL (ref 8–23)
CO2: 22 mmol/L (ref 22–32)
CREATININE: 1.05 mg/dL — AB (ref 0.44–1.00)
Calcium: 10 mg/dL (ref 8.9–10.3)
Chloride: 103 mmol/L (ref 98–111)
GFR calc non Af Amer: 52 mL/min — ABNORMAL LOW (ref >60)
Glucose, Bld: 136 mg/dL — ABNORMAL HIGH (ref 70–99)
Potassium: 4.3 mmol/L (ref 3.5–5.1)
Sodium: 140 mmol/L (ref 135–145)

## 2018-12-03 LAB — HEMOGLOBIN A1C
Hgb A1c MFr Bld: 5.4 % (ref 4.8–5.6)
Mean Plasma Glucose: 108.28 mg/dL

## 2018-12-03 LAB — LIPID PANEL
Cholesterol: 193 mg/dL (ref 0–200)
HDL: 77 mg/dL (ref >40)
LDL Cholesterol: 74 mg/dL (ref 0–99)
Total CHOL/HDL Ratio: 2.5 RATIO
Triglycerides: 211 mg/dL — ABNORMAL HIGH (ref <150)
VLDL: 42 mg/dL — ABNORMAL HIGH (ref 0–40)

## 2018-12-03 LAB — TSH: TSH: 1.404 u[IU]/mL (ref 0.350–4.500)

## 2018-12-03 LAB — MAGNESIUM: Magnesium: 1.9 mg/dL (ref 1.7–2.4)

## 2018-12-03 LAB — D-DIMER, QUANTITATIVE: D-Dimer, Quant: 2.51 ug/mL-FEU — ABNORMAL HIGH (ref 0.00–0.50)

## 2018-12-03 MED ORDER — ASPIRIN EC 81 MG PO TBEC
81.0000 mg | DELAYED_RELEASE_TABLET | Freq: Every day | ORAL | Status: DC
  Administered 2018-12-04 – 2018-12-05 (×2): 81 mg via ORAL
  Filled 2018-12-03 (×2): qty 1

## 2018-12-03 MED ORDER — SODIUM CHLORIDE 0.9 % IV BOLUS
1000.0000 mL | Freq: Once | INTRAVENOUS | Status: AC
  Administered 2018-12-03: 1000 mL via INTRAVENOUS

## 2018-12-03 MED ORDER — SIMVASTATIN 20 MG PO TABS: 20.0000 mg | ORAL_TABLET | Freq: Every day | ORAL | Status: DC

## 2018-12-03 MED ORDER — ACETAMINOPHEN 325 MG PO TABS: 650.0000 mg | ORAL_TABLET | Freq: Four times a day (QID) | ORAL | Status: DC | PRN

## 2018-12-03 MED ORDER — ASPIRIN 325 MG PO TABS
325.0000 mg | ORAL_TABLET | Freq: Every day | ORAL | Status: DC
  Administered 2018-12-03: 325 mg via ORAL
  Filled 2018-12-03: qty 1

## 2018-12-03 MED ORDER — IOPAMIDOL (ISOVUE-370) INJECTION 76%
100.0000 mL | Freq: Once | INTRAVENOUS | Status: AC | PRN
  Administered 2018-12-03: 100 mL via INTRAVENOUS

## 2018-12-03 MED ORDER — HEPARIN BOLUS VIA INFUSION
4000.0000 [IU] | Freq: Once | INTRAVENOUS | Status: AC
  Administered 2018-12-03: 4000 [IU] via INTRAVENOUS
  Filled 2018-12-03: qty 4000

## 2018-12-03 MED ORDER — HEPARIN (PORCINE) 25000 UT/250ML-% IV SOLN
750.0000 [IU]/h | INTRAVENOUS | Status: DC
  Administered 2018-12-03: 800 [IU]/h via INTRAVENOUS
  Filled 2018-12-03: qty 250

## 2018-12-03 NOTE — H&P (Signed)
History and Physical  Stephanie Newton:[id number] DOB: 09/26/1941 DOA: 12/03/2018 1444  Referring physician: Creig Hines [facility] ED) PCP: Lajean Manes, MD   HISTORY   Chief Complaint: palpitations then chest pain  HPI: Stephanie Newton is a 77 y.o. female with HTN, HLD, osteoarthritis s/p L knee replacement, and reported history of palpitations (without diagnosis), who presented with palpitations x 1 hour today while at home. Patient reports that she was cooking in the kitchen around 1pm when she felt the palpitations. She continued cooking because in the past, the palpitations have been transient or self limited without intervention. However, the palpitations -- described as heart "racing" and "pounding" persisted for an hour -- and she began having substernal and left sided chest tightness, radiating into her left arm and upper neck. She arrived to [facility] ED given the persistent symptoms by family transportation; once she arrived to her room, the symptoms (palpitations) resolved. Currently chest pain free.   Denies any known cardiac history. She reports having palpitations for several years that occur about once every 4-6 months, often triggered when she drinks "too much tea" or during the summer/hot weather. Has previously always lasted only for a few minutes and has never caused chest pain in the past until this episode. She has never been diagnosed with any arrhythmia in the past. No known cardiac history (other than HTN/HLD managed by PCP). She denies family history of arrhythmia.    Review of Systems:  + palpitations + associated chest pain with radiation as described above - no fevers/chills - no URI symptoms - no urinary symptoms - no cough - no edema, PND, orthopnea - no dyspnea on exertion - no nausea/vomiting; no tarry, melanotic or bloody stools - no dysuria, increased urinary frequency - no weight changes Rest of systems reviewed are negative, except as per above history.    ED course:  Vitals Blood pressure 134/82, pulse 65, temperature 97.9 F (36.6 C), temperature source Oral, resp. rate (!) 32, SpO2 100 %. Received full dose aspirin 325mg  x 1. Imaging and labs described below. Notable for troponin elevated to 0.22.   Past Medical History:  Diagnosis Date  . Hypercholesteremia   . Hypertension    Past Surgical History:  Procedure Laterality Date  . BREAST BIOPSY     bilateral   . EYE SURGERY     cataract bilateral   . HERNIA REPAIR  06/2000   INCISIONAL VENTRAL ; DR Nedra Hai   . KNEE ARTHROSCOPY Left 08/23/2017   dr Theda Sers   . LAPAROSCOPIC CHOLECYSTECTOMY  06/13/2000   DR Nedra Hai   . TOTAL KNEE ARTHROPLASTY Left 03/31/2018   Procedure: LEFT TOTAL KNEE ARTHROPLASTY;  Surgeon: Sydnee Cabal, MD;  Location: [facility] ORS;  Service: Orthopedics;  Laterality: Left;  with block  . VAGINAL HYSTERECTOMY  age 35s    Social History:  reports that she has been smoking cigarettes. She has smoked for the past 30.00 years. She has never used smokeless tobacco. She reports current alcohol use. She reports that she does not use drugs.  Allergies  Allergen Reactions  . Shellfish Allergy Nausea Only    No family history on file.    Prior to Admission medications   Medication Sig Start Date End Date Taking? Authorizing Provider  atenolol (TENORMIN) 100 MG tablet Take 100 mg by mouth daily.    [provider]  baclofen (LIORESAL) 10 MG tablet Take 1 tablet (10 mg total) by mouth 3 (three) times daily  as needed for muscle spasms. 03/31/18 03/31/19  Stilwell, Sueanne Margarita, PA-C  estradiol (ESTRACE) 1 MG tablet Take 1 mg by mouth daily.    [provider]  hydrochlorothiazide (MICROZIDE) 12.5 MG capsule Take 12.5 mg by mouth daily.    [provider]  lisinopril (PRINIVIL,ZESTRIL) 10 MG tablet Take 10 mg by mouth daily.    [provider]  ondansetron (ZOFRAN) 4 MG tablet Take 1 tablet (4 mg total) by mouth every 6 (six)  hours. Patient not taking: Reported on 03/17/2018 11/27/16   Forde Dandy, MD  potassium chloride SA (K-DUR,KLOR-CON) 20 MEQ tablet Take 1 tablet (20 mEq total) by mouth 2 (two) times daily. Patient not taking: Reported on 03/17/2018 11/27/16   Forde Dandy, MD  simvastatin (ZOCOR) 20 MG tablet Take 20 mg by mouth every evening.    [provider]  sodium chloride (OCEAN) 0.65 % SOLN nasal spray Place 1 spray into both nostrils as needed for congestion.    [provider]    PHYSICAL EXAM   Temp:  [97.9 F (36.6 C)] 97.9 F (36.6 C) (12/29 1446) Pulse Rate:  [50-83] 65 (12/29 1900) Cardiac Rhythm: Normal sinus rhythm (12/29 1449) Resp:  [17-32] 32 (12/29 1900) BP: (95-149)/(46-91) 134/82 (12/29 1900) SpO2:  [95 %-100 %] 100 % (12/29 1900)  BP 134/82   Pulse 65   Temp 97.9 F (36.6 C) (Oral)   Resp (!) 32   SpO2 100%    GEN thin elderly african-american female; resting comfortably in bed  HEENT NCAT EOM intact PERRL; clear oropharynx, no cervical LAD; moist mucus membranes  JVP estimated 5 cm H2O above RA; no HJR ; no carotid bruits b/l ;  CV regular normal rate; normal S1 and S2; no m/r/g or S3/S4; PMI non displaced; no parasternal heave  RESP CTA b/l; breathing unlabored and symmetric  ABD soft NT ND +normoactive BS  EXT warm throughout b/l; no peripheral edema b/l  PULSES  DP and radials 2+ intact b/l  SKIN/MSK no rashes or lesions; tattoo on R dorsal foot  NEURO/PSYCH AAOx4; no focal deficits   DATA   LABS ON ADMISSION:  Basic Metabolic Panel: Recent Labs  Lab 12/03/18 1447  NA 140  K 4.3  CL 103  CO2 22  GLUCOSE 136*  BUN 11  CREATININE 1.05*  CALCIUM 10.0   CBC: Recent Labs  Lab 12/03/18 1447  WBC 6.2  HGB 16.1*  HCT 48.2*  MCV 90.9  PLT 229   Liver Function Tests: No results for input(s): AST, ALT, ALKPHOS, BILITOT, PROT, ALBUMIN in the last 168 hours. No results for input(s): LIPASE, AMYLASE in the last 168 hours. No results  for input(s): AMMONIA in the last 168 hours. Coagulation:  Lab Results  Component Value Date   INR 0.93 03/22/2018   No results found for: PTT Lactic Acid, Venous:  No results found for: LATICACIDVEN Cardiac Enzymes: No results for input(s): CKTOTAL, CKMB, CKMBINDEX, TROPONINI in the last 168 hours. Urinalysis:    Component Value Date/Time   COLORURINE YELLOW 03/22/2018 1400   APPEARANCEUR CLEAR 03/22/2018 1400   LABSPEC 1.016 03/22/2018 1400   PHURINE 5.0 03/22/2018 1400   GLUCOSEU NEGATIVE 03/22/2018 1400   HGBUR SMALL (A) 03/22/2018 1400   BILIRUBINUR NEGATIVE 03/22/2018 1400   KETONESUR 5 (A) 03/22/2018 1400   PROTEINUR NEGATIVE 03/22/2018 1400   NITRITE NEGATIVE 03/22/2018 1400   LEUKOCYTESUR TRACE (A) 03/22/2018 1400    BNP (last 3 results) No results  for input(s): PROBNP in the last 8760 hours. CBG: No results for input(s): GLUCAP in the last 168 hours.  Radiological Exams on Admission: Ct Angio Chest Pe W And/or Wo Contrast  Result Date: 12/03/2018 CLINICAL DATA:  Palpitations. EXAM: CT ANGIOGRAPHY CHEST WITH CONTRAST TECHNIQUE: Multidetector CT imaging of the chest was performed using the standard protocol during bolus administration of intravenous contrast. Multiplanar CT image reconstructions and MIPs were obtained to evaluate the vascular anatomy. CONTRAST:  153mL ISOVUE-370 IOPAMIDOL (ISOVUE-370) INJECTION 76% COMPARISON:  Chest x-ray dated 12/03/2018 FINDINGS: Cardiovascular: Satisfactory opacification of the pulmonary arteries to the segmental level. No evidence of pulmonary embolism. Normal heart size. No pericardial effusion. Mediastinum/Nodes: No enlarged mediastinal, hilar, or axillary lymph nodes. Thyroid gland, trachea, and esophagus demonstrate no significant findings. Lungs/Pleura: There is an ill-defined 9 mm nodule in the superior segment of the left lower lobe on image 38 and an 8 mm slightly lobulated nodule in the left lower lobe posterior medially on  image number 93. There is a 5 mm ill-defined nodule in the right upper lobe on image 37. All of these are visible on series 6 and series 7. No infiltrates or effusions. Upper Abdomen: 3.4 cm low-density lesion in the upper pole of the right kidney, most likely a cyst. 12 mm low-density lesion in the anterior aspect of the left lobe of the liver, probably a cyst. Diverticulosis of the splenic flexure of the colon. Cholecystectomy. Musculoskeletal: No chest wall abnormality. No acute or significant osseous findings. Review of the MIP images confirms the above findings. IMPRESSION: 1. No pulmonary emboli or other acute abnormalities. 2. Multiple bilateral pulmonary nodules. Non-contrast chest CT at 3 months is recommended. If the nodules are stable at time of repeat CT, then future CT at 18-24 months (from today's scan) is considered optional for low-risk patients, but is recommended for high-risk patients. This recommendation follows the consensus statement: Guidelines for Management of Incidental Pulmonary Nodules Detected on CT Images: From the Fleischner Society 2017; Radiology 2017; 284:228-243. Electronically Signed   By: Lorriane Shire M.D.   On: 12/03/2018 18:37   Dg Chest Port 1 View  Result Date: 12/03/2018 CLINICAL DATA:  Heart palpitations today EXAM: PORTABLE CHEST 1 VIEW COMPARISON:  03/16/2014 FINDINGS: The heart size and mediastinal contours are within normal limits. Both lungs are clear. The visualized skeletal structures are unremarkable. IMPRESSION: No active disease. Electronically Signed   By: Ashley Royalty M.D.   On: 12/03/2018 15:11    EKG: Independently reviewed. NSR; no ST abnormalities;   I have reviewed the patient's previous electronic chart records, labs, and other data.   ASSESSMENT AND PLAN   Assessment: Stephanie Newton is a 77 y.o. female with hx of HTN and HLD and reported history of recurrent palpitations (without formal diagnosis) who presented with palpitations and  associated left sided chest pain. Upon arrival, symptoms (including palpitations) resolved, and no arrhythmia captured thus far. Labs notable for elevated troponin 0.01 --> 0.22. EKG shows NSR without ST changes. History is suggestive of demand ischemia ie NSTEMI type 2 in setting of described palpitations by patient, suspicious for either paroxysmal aflutter or afib. Will discuss with cardiology regarding ischemic workup. She does have HLD and HTN, but no other cardiac history. Furthermore, given age/gender/HTN, her CHADS2VAsc would be at least 3.    Active Problems:   Palpitations  Plan:   # Acute palpitations episode in setting of recurrent palpitations every 4-6 months (no prior diagnosis). Associated chest pain and troponin  elevation. Suspect demand ischemia. No prior ischemic workup.  > Asymptomatic on arrival; EKG normal upon arrival - telemetry - TTE ordered - appreciate cardiology recs re: ischemic workup while inpatient - heparin drip overnight for NSTEMI - TSH normal; free T4 pending - NPO after midnight - giving 1L NS bolus x 1 overnight - mag level pending; high goal repletion - if no arrhythmia detected on admission, consider Zio patch on discharge, especially given recurrence - continue discussion with patient regarding anti-coagulation if afib/flutter is diagnosed - resume home zocor 20mg  nightly - A1c and lipid panel pending  # Bilateral lung sub-cm nodules noted incidentally on CTA chest - discussed with patient overnight - needs 3 month outpatient repeat non contrast hi res CT chest  # Hx of HTN > normotensive on admission - holding home anti-HTN agents (HCTZ, lisinopril, atenolol) - may resume as per vitals and cardiology recs      DVT Prophylaxis:  Heparin drip  Code Status:  Full Code Family Communication: patient and family at bedside  Disposition Plan: admit to obs for cardiac telemetry; dispo pending cardiology recs   Patient contact: Extended Emergency  Contact Information Primary Emergency Contact: Rhett,Joseph Address: [address]          [place], [place] Phone: [phone] Relation: Spouse  Time spent: > 35 mins  Colbert Ewing, MD [facility] Pager [phone]  If 7PM-7AM, please contact night-coverage [email or web address] Password [email or web address] 12/03/2018, 8:59 PM

## 2018-12-03 NOTE — ED Notes (Signed)
Pt transported to CT ?

## 2018-12-03 NOTE — ED Triage Notes (Signed)
Patient complains of palpitations with heart racing and left arm tingling 45 minutes prior to arrival. Once in treatment room reports that all symptoms resolved. Has had same in past that resolved without treatment. Alert and oriented, no distress. smoker

## 2018-12-03 NOTE — ED Notes (Signed)
Patient denies pain and is resting comfortably.  

## 2018-12-03 NOTE — Progress Notes (Signed)
ANTICOAGULATION CONSULT NOTE - Initial Consult  Pharmacy Consult for heparin Indication: chest pain/ACS  Allergies  Allergen Reactions  . Shellfish Allergy Nausea Only    Patient Measurements: Ht: 5'4" Wt: 67.6 kg Heparin Dosing Weight: 67.6 kg  Vital Signs: Temp: 97.9 F (36.6 C) (12/29 1446) Temp Source: Oral (12/29 1446) BP: 134/82 (12/29 1900) Pulse Rate: 65 (12/29 1900)  Labs: Recent Labs    12/03/18 1447  HGB 16.1*  HCT 48.2*  PLT 229  CREATININE 1.05*    CrCl cannot be calculated (Unknown ideal weight.).   Medical History: Past Medical History:  Diagnosis Date  . Hypercholesteremia   . Hypertension     Medications:  Scheduled:  . [START ON 12/04/2018] aspirin EC  81 mg Oral Daily  . [START ON 12/04/2018] simvastatin  20 mg Oral q1800    Assessment: 50 yof presenting with palpitations and L arm tingling. No anticoagulation PTA. Starting heparin for ACS.  Troponin increased from 0.01 to 0.22. Hgb 16.1, plt 229. D-dimer 2.51. No s/sx of bleeding.   Goal of Therapy:  Heparin level 0.3-0.7 units/ml Monitor platelets by anticoagulation protocol: Yes   Plan:  Give 4000 units bolus x 1 Start heparin infusion at 800 units/hr Check anti-Xa level in 8 hours and daily while on heparin Continue to monitor H&H and platelets  Antonietta Jewel, PharmD, [place] Clinical Pharmacist  Pager: [phone] Phone: [phone] 12/03/2018,9:07 PM

## 2018-12-03 NOTE — ED Provider Notes (Signed)
[place] EMERGENCY DEPARTMENT Provider Note   CSN: [id number] Arrival date & time: 12/03/18  1437     History   Chief Complaint Chief Complaint  Patient presents with  . Palpitations    HPI Stephanie Newton is a 77 y.o. female.  The history is provided by the patient.    Patient is a 77 year old female with a past medical history of HTN, ongoing tobacco abuse, HDL, and currently on estrogen p.o. therapy for postmenopausal symptoms who presents for evaluation of an episode of palpitations associated with chest pressure, shortness of breath, and left arm paresthesias that began approximately hour prior to arrival.  Lasted approximately 30 minutes, and has since resolved.  Patient denies any precipitating symptoms or alleviating or aggravating factors including positional or exertional factors.  She denies personal episodes.  Denies any recent headaches, earache, sore throat, cough, fevers, belly pain, vomiting, diarrhea, dysuria, blood in her stool, blood in her urine, focal extremity weakness, numbness, or paresthesias in any extremity.  Denies any other known cardiac history, history of DVT/PE, history of CVA, history of malignancy, or other PE risk factors.  Past Medical History:  Diagnosis Date  . Hypercholesteremia   . Hypertension     Patient Active Problem List   Diagnosis Date Noted  . Palpitations 12/03/2018  . NSTEMI (non-ST elevated myocardial infarction) ([place]) 12/03/2018  . S/P knee replacement 03/31/2018  . Primary osteoarthritis of knee 03/06/2018    Past Surgical History:  Procedure Laterality Date  . BREAST BIOPSY     bilateral   . EYE SURGERY     cataract bilateral   . HERNIA REPAIR  06/2000   INCISIONAL VENTRAL ; DR Nedra Hai   . KNEE ARTHROSCOPY Left 08/23/2017   dr Theda Sers   . LAPAROSCOPIC CHOLECYSTECTOMY  06/13/2000   DR Nedra Hai   . TOTAL KNEE ARTHROPLASTY Left 03/31/2018   Procedure: LEFT TOTAL KNEE ARTHROPLASTY;   Surgeon: Sydnee Cabal, MD;  Location: [facility];  Service: Orthopedics;  Laterality: Left;  with block  . VAGINAL HYSTERECTOMY  age 12s     OB History   No obstetric history on file.      Home Medications    Prior to Admission medications   Medication Sig Start Date End Date Taking? Authorizing Provider  atenolol (TENORMIN) 100 MG tablet Take 100 mg by mouth daily.   Yes [provider]  estradiol (ESTRACE) 1 MG tablet Take 1 mg by mouth daily.   Yes [provider]  hydrochlorothiazide (MICROZIDE) 12.5 MG capsule Take 12.5 mg by mouth daily.   Yes [provider]  lisinopril (PRINIVIL,ZESTRIL) 10 MG tablet Take 10 mg by mouth daily.   Yes [provider]  simvastatin (ZOCOR) 20 MG tablet Take 20 mg by mouth every evening.   Yes [provider]    Family History No family history on file.  Social History Social History   Tobacco Use  . Smoking status: Current Some Day Smoker    Years: 30.00    Types: Cigarettes  . Smokeless tobacco: Never Used  . Tobacco comment: 6 cigarettes a day  Substance Use Topics  . Alcohol use: Yes    Comment: occasionally   . Drug use: No     Allergies   Shellfish allergy   Review of Systems Review of Systems  Constitutional: Negative for chills and fever.  HENT: Negative for ear pain and sore throat.   Eyes: Negative for pain and visual disturbance.  Respiratory: Positive for shortness of breath. Negative for cough.   Cardiovascular: Positive for chest pain and palpitations.  Gastrointestinal: Negative for abdominal pain and vomiting.  Genitourinary: Negative for dysuria and hematuria.  Musculoskeletal: Negative for arthralgias and back pain.  Skin: Negative for color change and rash.  Neurological: Negative for seizures and syncope.  All other systems reviewed and are negative.    Physical Exam Updated Vital Signs BP 140/73   Pulse 65   Temp 98.7 F (37.1 C) (Oral)   Resp 18    Ht 5\' 4"  (1.626 m)   Wt 67.6 kg   SpO2 100%   BMI 25.58 kg/m   Physical Exam Vitals signs and nursing note reviewed.  Constitutional:      General: She is not in acute distress.    Appearance: Normal appearance. She is well-developed and normal weight.  HENT:     Head: Normocephalic and atraumatic.     Right Ear: External ear normal.     Left Ear: External ear normal.     Nose: Nose normal.     Mouth/Throat:     Mouth: Mucous membranes are moist.  Eyes:     Conjunctiva/sclera: Conjunctivae normal.  Neck:     Musculoskeletal: Neck supple. No neck rigidity.  Cardiovascular:     Rate and Rhythm: Normal rate and regular rhythm.     Pulses: Normal pulses.     Heart sounds: No murmur.  Pulmonary:     Effort: Pulmonary effort is normal. No respiratory distress.     Breath sounds: Normal breath sounds.  Abdominal:     General: There is no distension.     Palpations: Abdomen is soft.     Tenderness: There is no abdominal tenderness.  Skin:    General: Skin is warm and dry.     Capillary Refill: Capillary refill takes less than 2 seconds.  Neurological:     General: No focal deficit present.     Mental Status: She is alert.      ED Treatments / Results  Labs (all labs ordered are listed, but only abnormal results are displayed) Labs Reviewed  BASIC METABOLIC PANEL - Abnormal; Notable for the following components:      Result Value   Glucose, Bld 136 (*)    Creatinine, Ser 1.05 (*)    GFR calc non Af Amer 52 (*)    All other components within normal limits  CBC - Abnormal; Notable for the following components:   RBC 5.30 (*)    Hemoglobin 16.1 (*)    HCT 48.2 (*)    All other components within normal limits  D-DIMER, QUANTITATIVE (NOT AT [facility]) - Abnormal; Notable for the following components:   D-Dimer, Quant 2.51 (*)    All other components within normal limits  LIPID PANEL - Abnormal; Notable for the following components:   Triglycerides 211 (*)    VLDL 42 (*)      All other components within normal limits  I-STAT TROPONIN, ED - Abnormal; Notable for the following components:   Troponin i, poc 0.22 (*)    All other components within normal limits  TSH  MAGNESIUM  HEMOGLOBIN S2G  BASIC METABOLIC PANEL  CBC  MAGNESIUM  T4, FREE  TROPONIN I  TROPONIN I  HEPARIN LEVEL (UNFRACTIONATED)  BRAIN NATRIURETIC PEPTIDE  I-STAT TROPONIN, ED    EKG EKG Interpretation  Date/Time:  Sunday December 03 2018 20:33:17 EST Ventricular Rate:  65 PR Interval:    QRS  Duration: 84 QT Interval:  419 QTC Calculation: 436 R Axis:   93 Text Interpretation:  Sinus rhythm Right axis deviation Low voltage, precordial leads similar to previous Confirmed by Theotis Burrow [phone]) on 12/03/2018 8:42:27 PM   Radiology Ct Angio Chest Pe W And/or Wo Contrast  Result Date: 12/03/2018 CLINICAL DATA:  Palpitations. EXAM: CT ANGIOGRAPHY CHEST WITH CONTRAST TECHNIQUE: Multidetector CT imaging of the chest was performed using the standard protocol during bolus administration of intravenous contrast. Multiplanar CT image reconstructions and MIPs were obtained to evaluate the vascular anatomy. CONTRAST:  151mL ISOVUE-370 IOPAMIDOL (ISOVUE-370) INJECTION 76% COMPARISON:  Chest x-ray dated 12/03/2018 FINDINGS: Cardiovascular: Satisfactory opacification of the pulmonary arteries to the segmental level. No evidence of pulmonary embolism. Normal heart size. No pericardial effusion. Mediastinum/Nodes: No enlarged mediastinal, hilar, or axillary lymph nodes. Thyroid gland, trachea, and esophagus demonstrate no significant findings. Lungs/Pleura: There is an ill-defined 9 mm nodule in the superior segment of the left lower lobe on image 38 and an 8 mm slightly lobulated nodule in the left lower lobe posterior medially on image number 93. There is a 5 mm ill-defined nodule in the right upper lobe on image 37. All of these are visible on series 6 and series 7. No infiltrates or effusions. Upper  Abdomen: 3.4 cm low-density lesion in the upper pole of the right kidney, most likely a cyst. 12 mm low-density lesion in the anterior aspect of the left lobe of the liver, probably a cyst. Diverticulosis of the splenic flexure of the colon. Cholecystectomy. Musculoskeletal: No chest wall abnormality. No acute or significant osseous findings. Review of the MIP images confirms the above findings. IMPRESSION: 1. No pulmonary emboli or other acute abnormalities. 2. Multiple bilateral pulmonary nodules. Non-contrast chest CT at 3 months is recommended. If the nodules are stable at time of repeat CT, then future CT at 18-24 months (from today's scan) is considered optional for low-risk patients, but is recommended for high-risk patients. This recommendation follows the consensus statement: Guidelines for Management of Incidental Pulmonary Nodules Detected on CT Images: From the Fleischner Society 2017; Radiology 2017; 284:228-243. Electronically Signed   By: Lorriane Shire M.D.   On: 12/03/2018 18:37   Dg Chest Port 1 View  Result Date: 12/03/2018 CLINICAL DATA:  Heart palpitations today EXAM: PORTABLE CHEST 1 VIEW COMPARISON:  03/16/2014 FINDINGS: The heart size and mediastinal contours are within normal limits. Both lungs are clear. The visualized skeletal structures are unremarkable. IMPRESSION: No active disease. Electronically Signed   By: Ashley Royalty M.D.   On: 12/03/2018 15:11    Procedures Procedures (including critical care time)  Medications Ordered in ED Medications  aspirin EC tablet 81 mg (has no administration in time range)  sodium chloride 0.9 % bolus 1,000 mL (1,000 mLs Intravenous New Bag/Given 12/03/18 2259)  acetaminophen (TYLENOL) tablet 650 mg (has no administration in time range)  simvastatin (ZOCOR) tablet 20 mg (has no administration in time range)  heparin ADULT infusion 100 units/mL (25000 units/245mL sodium chloride 0.45%) (800 Units/hr Intravenous New Bag/Given 12/03/18 2216)   iopamidol (ISOVUE-370) 76 % injection 100 mL (100 mLs Intravenous Contrast Given 12/03/18 1749)  heparin bolus via infusion 4,000 Units (4,000 Units Intravenous Bolus from Bag 12/03/18 2217)     Initial Impression / Assessment and Plan / ED Course  I have reviewed the triage vital signs and the nursing notes.  Pertinent labs & imaging results that were available during my care of the patient were reviewed by me and  considered in my medical decision making (see chart for details).     Patient is a 77 year old female who presents above-stated history exam.  On presentation patient is afebrile stable vital signs.  Exam as above remarkable for intact extremity pulses, lungs clear to auscultation bilaterally, no JVD, abdomen soft nontender, and otherwise unremarkable exam.  EKG shows borderline right axis deviation with a ventricular rate of 83 and otherwise normal intervals and no signs of acute ischemic change.  Initial troponin is 0.01.  Given age and risk factors will obtain 3-hour troponin.  BMP shows no significant electrolyte abnormalities.  Doubt symptomatic anemia given CBC shows hemoglobin of 16.1.  In addition there is no lymphocytosis and platelets are WNL.  Dimer is elevated at 2.5.  However CTA chest did not show any PEs or pneumonia or pneumothorax.  TSH WNL.  Patient given aspirin 325.  3-hour troponin 0 0.22.  Cardiology service consulted.  No acute recommendations but they did recommend admission to hospital service.  Repeat ECG did not show any acute findings.  Patient admitted to hospital service in stable condition.   Final Clinical Impressions(s) / ED Diagnoses   Final diagnoses:  Palpitations  Elevated troponin  Chest pressure    ED Discharge Orders    None       Hulan Saas, MD 12/04/18 0004    Rex Kras, Wenda Overland, MD 12/04/18 [phone]

## 2018-12-03 NOTE — Consult Note (Signed)
[facility] Consult Note   Primary Physician:  Lajean Manes, MD Primary Cardiologist:   No primary cardiologist  Reason for Consultation:  Palpitations and chest heaviness  HPI:    Stephanie Newton is a 77 year old female with a past medical history significant for hypertension, tobacco use, hyperlipidemia, and osteoarthritis who presents to the hospital with complaints of palpitations and chest tightness.  The patient states that she has had infrequent palpitations for the past few years.  She has never had a formal cardiac work-up for this. Today in the afternoon she developed palpitations that lasted for approximately 30 to 40 minutes.  She had some associated chest tightness as well.  The pain radiated to her neck and her left arm.  As the pain resolved she also had some tingling in that extremity.  She denies any nausea or vomiting.  However she was diaphoretic.  She does not endorse any orthopnea, paroxysmal nocturnal dyspnea or lower extremity edema.  The patient leads a relatively active lifestyle.  She does all of her house chores without any provocation of symptoms.  She is compliant with all of her medications.  She does smoke 6 to 7 cigarettes/day.  In the ED her ECG revealed some low voltages in the precordial leads.  There were no dynamic ST-T wave changes.  The initial troponin I was 0.01 and the repeat measurement was 0.22.  She was treated with aspirin and IV heparin.  A CT angiogram was negative for pulmonary embolism.     Home Medications Prior to Admission medications   Medication Sig Start Date End Date Taking? Authorizing Provider  atenolol (TENORMIN) 100 MG tablet Take 100 mg by mouth daily.    [provider]  baclofen (LIORESAL) 10 MG tablet Take 1 tablet (10 mg total) by mouth 3 (three) times daily as needed for muscle spasms. 03/31/18 03/31/19  Stilwell, Sueanne Margarita, PA-C  estradiol (ESTRACE) 1 MG tablet Take 1 mg by mouth daily.    [provider]  hydrochlorothiazide (MICROZIDE) 12.5 MG capsule Take 12.5 mg by mouth daily.    [provider]  lisinopril (PRINIVIL,ZESTRIL) 10 MG tablet Take 10 mg by mouth daily.    [provider]  ondansetron (ZOFRAN) 4 MG tablet Take 1 tablet (4 mg total) by mouth every 6 (six) hours. Patient not taking: Reported on 03/17/2018 11/27/16   Forde Dandy, MD  potassium chloride SA (K-DUR,KLOR-CON) 20 MEQ tablet Take 1 tablet (20 mEq total) by mouth 2 (two) times daily. Patient not taking: Reported on 03/17/2018 11/27/16   Forde Dandy, MD  simvastatin (ZOCOR) 20 MG tablet Take 20 mg by mouth every evening.    [provider]  sodium chloride (OCEAN) 0.65 % SOLN nasal spray Place 1 spray into both nostrils as needed for congestion.    [provider]    Past Medical History: Past Medical History:  Diagnosis Date  . Hypercholesteremia   . Hypertension     Past Surgical History: Past Surgical History:  Procedure Laterality Date  . BREAST BIOPSY     bilateral   . EYE SURGERY     cataract bilateral   . HERNIA REPAIR  06/2000   INCISIONAL VENTRAL ; DR Nedra Hai   . KNEE ARTHROSCOPY Left 08/23/2017   dr Theda Sers   . LAPAROSCOPIC CHOLECYSTECTOMY  06/13/2000   DR Nedra Hai   . TOTAL KNEE ARTHROPLASTY Left 03/31/2018   Procedure: LEFT TOTAL KNEE ARTHROPLASTY;  Surgeon:  Sydnee Cabal, MD;  Location: [facility];  Service: Orthopedics;  Laterality: Left;  with block  . VAGINAL HYSTERECTOMY  age 48s    Family History: No family history on file.  Social History: Social History   Socioeconomic History  . Marital status: Married    Spouse name: Not on file  . Number of children: Not on file  . Years of education: Not on file  . Highest education level: Not on file  Occupational History  . Not on file  Social Needs  . Financial resource strain: Not on file  . Food insecurity:    Worry: Not on file    Inability: Not on file  .  Transportation needs:    Medical: Not on file    Non-medical: Not on file  Tobacco Use  . Smoking status: Current Some Day Smoker    Years: 30.00    Types: Cigarettes  . Smokeless tobacco: Never Used  . Tobacco comment: 6 cigarettes a day  Substance and Sexual Activity  . Alcohol use: Yes    Comment: occasionally   . Drug use: No  . Sexual activity: Not on file  Lifestyle  . Physical activity:    Days per week: Not on file    Minutes per session: Not on file  . Stress: Not on file  Relationships  . Social connections:    Talks on phone: Not on file    Gets together: Not on file    Attends religious service: Not on file    Active member of club or organization: Not on file    Attends meetings of clubs or organizations: Not on file    Relationship status: Not on file  Other Topics Concern  . Not on file  Social History Narrative  . Not on file    Allergies:  Allergies  Allergen Reactions  . Shellfish Allergy Nausea Only     Review of Systems: [y] = yes, [ ]  = no   . General: Weight gain [ ] ; Weight loss [ ] ; Anorexia [ ] ; Fatigue [ ] ; Fever [ ] ; Chills [ ] ; Weakness [ ]   . Cardiac: Chest pain/pressure Blue.Reese ]; Resting SOB [ ] ; Exertional SOB [ ] ; Orthopnea [ ] ; Pedal Edema [ ] ; Palpitations Blue.Reese ]; Syncope [ ] ; Presyncope [ ] ; Paroxysmal nocturnal dyspnea[ ]   . Pulmonary: Cough [ ] ; Wheezing[ ] ; Hemoptysis[ ] ; Sputum [ ] ; Snoring [ ]   . GI: Vomiting[ ] ; Dysphagia[ ] ; Melena[ ] ; Hematochezia [ ] ; Heartburn[ ] ; Abdominal pain [ ] ; Constipation [ ] ; Diarrhea [ ] ; BRBPR [ ]   . GU: Hematuria[ ] ; Dysuria [ ] ; Nocturia[ ]   . Vascular: Pain in legs with walking [ ] ; Pain in feet with lying flat [ ] ; Non-healing sores [ ] ; Stroke [ ] ; TIA [ ] ; Slurred speech [ ] ;  . Neuro: Headaches[ ] ; Vertigo[ ] ; Seizures[ ] ; Paresthesias[ ] ;Blurred vision [ ] ; Diplopia [ ] ; Vision changes [ ]   . Ortho/Skin: Arthritis [ ] ; Joint pain [ ] ; Muscle pain [ ] ; Joint swelling [ ] ; Back Pain [ ] ; Rash [ ]    . Psych: Depression[ ] ; Anxiety[ ]   . Heme: Bleeding problems [ ] ; Clotting disorders [ ] ; Anemia [ ]   . Endocrine: Diabetes [ ] ; Thyroid dysfunction[ ]      Objective:    Vital Signs:   Temp:  [97.9 F (36.6 C)] 97.9 F (36.6 C) (12/29 1446) Pulse Rate:  [50-83] 65 (12/29 1900) Resp:  [17-32] 32 (12/29 1900)  BP: (95-149)/(46-91) 134/82 (12/29 1900) SpO2:  [95 %-100 %] 100 % (12/29 1900)    Weight change: There were no vitals filed for this visit.  Intake/Output:  No intake or output data in the 24 hours ending 12/03/18 2052    Physical Exam    General:  Well appearing. No resp difficulty HEENT: normal Neck: supple. JVP . Carotids 2+ bilat; no bruits. No lymphadenopathy or thyromegaly appreciated. Cor: PMI nondisplaced. Regular rate & rhythm. No rubs, gallops or murmurs. Lungs: clear to auscultation bilaterally Abdomen: soft, nontender, nondistended. No hepatosplenomegaly. No bruits or masses. Good bowel sounds. Extremities: no cyanosis, clubbing, rash, edema Neuro: alert & orientedx3, cranial nerves grossly intact. moves all 4 extremities w/o difficulty. Affect pleasant    EKG    Her ECG from 12/03/18 (that I personally reviewed) shows sinus rhythm, rate 83 bpm, low voltage in the precordial leads, and no ischemic ST-T wave changes.   Labs   Basic Metabolic Panel: Recent Labs  Lab 12/03/18 1447  NA 140  K 4.3  CL 103  CO2 22  GLUCOSE 136*  BUN 11  CREATININE 1.05*  CALCIUM 10.0    Liver Function Tests: No results for input(s): AST, ALT, ALKPHOS, BILITOT, PROT, ALBUMIN in the last 168 hours. No results for input(s): LIPASE, AMYLASE in the last 168 hours. No results for input(s): AMMONIA in the last 168 hours.  CBC: Recent Labs  Lab 12/03/18 1447  WBC 6.2  HGB 16.1*  HCT 48.2*  MCV 90.9  PLT 229    Cardiac Enzymes: No results for input(s): CKTOTAL, CKMB, CKMBINDEX, TROPONINI in the last 168 hours.  BNP: BNP (last 3 results) No results  for input(s): BNP in the last 8760 hours.  ProBNP (last 3 results) No results for input(s): PROBNP in the last 8760 hours.   CBG: No results for input(s): GLUCAP in the last 168 hours.  Coagulation Studies: No results for input(s): LABPROT, INR in the last 72 hours.   Imaging   Ct Angio Chest Pe W And/or Wo Contrast  Result Date: 12/03/2018 CLINICAL DATA:  Palpitations. EXAM: CT ANGIOGRAPHY CHEST WITH CONTRAST TECHNIQUE: Multidetector CT imaging of the chest was performed using the standard protocol during bolus administration of intravenous contrast. Multiplanar CT image reconstructions and MIPs were obtained to evaluate the vascular anatomy. CONTRAST:  137mL ISOVUE-370 IOPAMIDOL (ISOVUE-370) INJECTION 76% COMPARISON:  Chest x-ray dated 12/03/2018 FINDINGS: Cardiovascular: Satisfactory opacification of the pulmonary arteries to the segmental level. No evidence of pulmonary embolism. Normal heart size. No pericardial effusion. Mediastinum/Nodes: No enlarged mediastinal, hilar, or axillary lymph nodes. Thyroid gland, trachea, and esophagus demonstrate no significant findings. Lungs/Pleura: There is an ill-defined 9 mm nodule in the superior segment of the left lower lobe on image 38 and an 8 mm slightly lobulated nodule in the left lower lobe posterior medially on image number 93. There is a 5 mm ill-defined nodule in the right upper lobe on image 37. All of these are visible on series 6 and series 7. No infiltrates or effusions. Upper Abdomen: 3.4 cm low-density lesion in the upper pole of the right kidney, most likely a cyst. 12 mm low-density lesion in the anterior aspect of the left lobe of the liver, probably a cyst. Diverticulosis of the splenic flexure of the colon. Cholecystectomy. Musculoskeletal: No chest wall abnormality. No acute or significant osseous findings. Review of the MIP images confirms the above findings. IMPRESSION: 1. No pulmonary emboli or other acute abnormalities. 2.  Multiple bilateral pulmonary nodules.  Non-contrast chest CT at 3 months is recommended. If the nodules are stable at time of repeat CT, then future CT at 18-24 months (from today's scan) is considered optional for low-risk patients, but is recommended for high-risk patients. This recommendation follows the consensus statement: Guidelines for Management of Incidental Pulmonary Nodules Detected on CT Images: From the Fleischner Society 2017; Radiology 2017; 284:228-243. Electronically Signed   By: Lorriane Shire M.D.   On: 12/03/2018 18:37   Dg Chest Port 1 View  Result Date: 12/03/2018 CLINICAL DATA:  Heart palpitations today EXAM: PORTABLE CHEST 1 VIEW COMPARISON:  03/16/2014 FINDINGS: The heart size and mediastinal contours are within normal limits. Both lungs are clear. The visualized skeletal structures are unremarkable. IMPRESSION: No active disease. Electronically Signed   By: Ashley Royalty M.D.   On: 12/03/2018 15:11      Medications:     Current Medications: . aspirin  325 mg Oral Daily     Infusions:     Assessment/Plan   1. Non-ST elevation myocardial infarction The patient had an episode of chest tightness today in the setting of palpitations.  Her cardiac biomarkers are slightly elevated with an unremarkable EKG.  She does have multiple risk factors for coronary artery disease including hypertension, hyperlipidemia, age and current smoking.  -Cycle cardiac enzymes -Obtain serial ECGs -Unfractionated IV heparin for 48 hours for NSTEMI -Aspirin 81 mg daily -High-dose statins -Check lipid panel, hemoglobin A1c -Transthoracic echocardiogram to evaluate LV function -Consider ischemic evaluation (myocardial perfusion scan versus cardiac catheterization)  2. Palpitations Patient has had off-and-on palpitations for the past few years.  She has not had a Holter monitor recording in the past.  It is unclear if the patient is experiencing episodes of atrial fibrillation/atrial  flutter or an SVT.  -Observe on telemetry -Maintain serum K >4.0 and Mg >2.0 -Patient will likely need an outpatient event monitor in case there is no documentation of any tachyarrhythmias during this hospitalization.    Meade Maw, MD  12/03/2018, 8:52 PM  Cardiology Overnight Team Please contact [facility] Cardiology for night-coverage after hours (4p -7a ) and weekends on [email or web address]

## 2018-12-04 ENCOUNTER — Observation Stay (HOSPITAL_BASED_OUTPATIENT_CLINIC_OR_DEPARTMENT_OTHER): Payer: Medicare Other

## 2018-12-04 ENCOUNTER — Ambulatory Visit (HOSPITAL_COMMUNITY)
Admission: EM | Disposition: A | Discharge status: Home / Self Care | Payer: Self-pay | Source: Home / Self Care | Attending: Emergency Medicine

## 2018-12-04 ENCOUNTER — Encounter (HOSPITAL_COMMUNITY): Payer: Self-pay

## 2018-12-04 ENCOUNTER — Other Ambulatory Visit: Payer: Self-pay

## 2018-12-04 DIAGNOSIS — R918 Other nonspecific abnormal finding of lung field: Secondary | ICD-10-CM | POA: Diagnosis not present

## 2018-12-04 DIAGNOSIS — I214 Non-ST elevation (NSTEMI) myocardial infarction: Secondary | ICD-10-CM | POA: Diagnosis not present

## 2018-12-04 DIAGNOSIS — I110 Hypertensive heart disease with heart failure: Secondary | ICD-10-CM | POA: Diagnosis not present

## 2018-12-04 DIAGNOSIS — Z79899 Other long term (current) drug therapy: Secondary | ICD-10-CM | POA: Diagnosis not present

## 2018-12-04 DIAGNOSIS — R7989 Other specified abnormal findings of blood chemistry: Secondary | ICD-10-CM | POA: Diagnosis not present

## 2018-12-04 DIAGNOSIS — E785 Hyperlipidemia, unspecified: Secondary | ICD-10-CM | POA: Diagnosis not present

## 2018-12-04 DIAGNOSIS — I2541 Coronary artery aneurysm: Secondary | ICD-10-CM | POA: Diagnosis not present

## 2018-12-04 DIAGNOSIS — Z716 Tobacco abuse counseling: Secondary | ICD-10-CM

## 2018-12-04 DIAGNOSIS — F17210 Nicotine dependence, cigarettes, uncomplicated: Secondary | ICD-10-CM | POA: Diagnosis not present

## 2018-12-04 DIAGNOSIS — R778 Other specified abnormalities of plasma proteins: Secondary | ICD-10-CM

## 2018-12-04 DIAGNOSIS — I5030 Unspecified diastolic (congestive) heart failure: Secondary | ICD-10-CM | POA: Diagnosis not present

## 2018-12-04 HISTORY — PX: LEFT HEART CATH AND CORONARY ANGIOGRAPHY: CATH118249

## 2018-12-04 LAB — CBC
HCT: 41.7 % (ref 36.0–46.0)
HEMOGLOBIN: 14.3 g/dL (ref 12.0–15.0)
MCH: 30.2 pg (ref 26.0–34.0)
MCHC: 34.3 g/dL (ref 30.0–36.0)
MCV: 88 fL (ref 80.0–100.0)
Platelets: 200 10*3/uL (ref 150–400)
RBC: 4.74 MIL/uL (ref 3.87–5.11)
RDW: 14.3 % (ref 11.5–15.5)
WBC: 5.5 10*3/uL (ref 4.0–10.5)
nRBC: 0 % (ref 0.0–0.2)

## 2018-12-04 LAB — BASIC METABOLIC PANEL
Anion gap: 10 (ref 5–15)
BUN: 9 mg/dL (ref 8–23)
CO2: 22 mmol/L (ref 22–32)
Calcium: 8.9 mg/dL (ref 8.9–10.3)
Chloride: 108 mmol/L (ref 98–111)
Creatinine, Ser: 0.84 mg/dL (ref 0.44–1.00)
GFR calc Af Amer: >60 mL/min (ref >60)
GFR calc non Af Amer: >60 mL/min (ref >60)
Glucose, Bld: 94 mg/dL (ref 70–99)
Potassium: 3.7 mmol/L (ref 3.5–5.1)
Sodium: 140 mmol/L (ref 135–145)

## 2018-12-04 LAB — BRAIN NATRIURETIC PEPTIDE: B Natriuretic Peptide: 68.2 pg/mL (ref 0.0–100.0)

## 2018-12-04 LAB — ECHOCARDIOGRAM COMPLETE
Height: 64 in
Weight: 2384.5 oz

## 2018-12-04 LAB — MAGNESIUM: Magnesium: 1.9 mg/dL (ref 1.7–2.4)

## 2018-12-04 LAB — TROPONIN I
Troponin I: 0.15 ng/mL (ref <0.03)
Troponin I: 0.11 ng/mL (ref <0.03)

## 2018-12-04 LAB — MRSA PCR SCREENING: MRSA by PCR: NEGATIVE

## 2018-12-04 LAB — HEPARIN LEVEL (UNFRACTIONATED): Heparin Unfractionated: 0.72 IU/mL — ABNORMAL HIGH (ref 0.30–0.70)

## 2018-12-04 LAB — T4, FREE: FREE T4: 1.2 ng/dL (ref 0.82–1.77)

## 2018-12-04 SURGERY — LEFT HEART CATH AND CORONARY ANGIOGRAPHY
Anesthesia: LOCAL

## 2018-12-04 MED ORDER — SODIUM CHLORIDE 0.9 % WEIGHT BASED INFUSION: 1.0000 mL/kg/h | INTRAVENOUS | Status: DC

## 2018-12-04 MED ORDER — HEPARIN SODIUM (PORCINE) 5000 UNIT/ML IJ SOLN
5000.0000 [IU] | Freq: Three times a day (TID) | INTRAMUSCULAR | Status: DC
  Administered 2018-12-04 – 2018-12-05 (×2): 5000 [IU] via SUBCUTANEOUS
  Filled 2018-12-04 (×2): qty 1

## 2018-12-04 MED ORDER — ATORVASTATIN CALCIUM 80 MG PO TABS
80.0000 mg | ORAL_TABLET | Freq: Every day | ORAL | Status: DC
  Administered 2018-12-04: 80 mg via ORAL
  Filled 2018-12-04: qty 1

## 2018-12-04 MED ORDER — FENTANYL CITRATE (PF) 100 MCG/2ML IJ SOLN
INTRAMUSCULAR | Status: DC | PRN
  Administered 2018-12-04: 25 ug via INTRAVENOUS

## 2018-12-04 MED ORDER — LIDOCAINE HCL (PF) 1 % IJ SOLN
INTRAMUSCULAR | Status: AC
  Filled 2018-12-04: qty 30

## 2018-12-04 MED ORDER — ATENOLOL 50 MG PO TABS
100.0000 mg | ORAL_TABLET | Freq: Every day | ORAL | Status: DC
  Administered 2018-12-04 – 2018-12-05 (×2): 100 mg via ORAL
  Filled 2018-12-04 (×2): qty 2

## 2018-12-04 MED ORDER — FENTANYL CITRATE (PF) 100 MCG/2ML IJ SOLN
INTRAMUSCULAR | Status: AC
  Filled 2018-12-04: qty 2

## 2018-12-04 MED ORDER — SODIUM CHLORIDE 0.9% FLUSH: 3.0000 mL | INTRAVENOUS | Status: DC | PRN

## 2018-12-04 MED ORDER — SODIUM CHLORIDE 0.9% FLUSH: 3.0000 mL | Freq: Two times a day (BID) | INTRAVENOUS | Status: DC

## 2018-12-04 MED ORDER — ACETAMINOPHEN 325 MG PO TABS: 650.0000 mg | ORAL_TABLET | ORAL | Status: DC | PRN

## 2018-12-04 MED ORDER — MIDAZOLAM HCL 2 MG/2ML IJ SOLN
INTRAMUSCULAR | Status: DC | PRN
  Administered 2018-12-04 (×2): 1 mg via INTRAVENOUS

## 2018-12-04 MED ORDER — VERAPAMIL HCL 2.5 MG/ML IV SOLN
INTRAVENOUS | Status: DC | PRN
  Administered 2018-12-04: 10 mL via INTRA_ARTERIAL

## 2018-12-04 MED ORDER — SODIUM CHLORIDE 0.9 % IV SOLN: INTRAVENOUS | Status: AC

## 2018-12-04 MED ORDER — SODIUM CHLORIDE 0.9 % WEIGHT BASED INFUSION
3.0000 mL/kg/h | INTRAVENOUS | Status: DC
  Administered 2018-12-04: 3 mL/kg/h via INTRAVENOUS

## 2018-12-04 MED ORDER — SODIUM CHLORIDE 0.9 % IV SOLN: 250.0000 mL | INTRAVENOUS | Status: DC | PRN

## 2018-12-04 MED ORDER — VERAPAMIL HCL 2.5 MG/ML IV SOLN
INTRAVENOUS | Status: AC
  Filled 2018-12-04: qty 2

## 2018-12-04 MED ORDER — HEPARIN (PORCINE) IN NACL 1000-0.9 UT/500ML-% IV SOLN
INTRAVENOUS | Status: AC
  Filled 2018-12-04: qty 1000

## 2018-12-04 MED ORDER — ONDANSETRON HCL 4 MG/2ML IJ SOLN: 4.0000 mg | Freq: Four times a day (QID) | INTRAMUSCULAR | Status: DC | PRN

## 2018-12-04 MED ORDER — IOHEXOL 350 MG/ML SOLN
INTRAVENOUS | Status: DC | PRN
  Administered 2018-12-04: 90 mL

## 2018-12-04 MED ORDER — HEPARIN (PORCINE) IN NACL 1000-0.9 UT/500ML-% IV SOLN
INTRAVENOUS | Status: DC | PRN
  Administered 2018-12-04 (×2): 500 mL

## 2018-12-04 MED ORDER — MIDAZOLAM HCL 2 MG/2ML IJ SOLN
INTRAMUSCULAR | Status: AC
  Filled 2018-12-04: qty 2

## 2018-12-04 MED ORDER — HEPARIN SODIUM (PORCINE) 1000 UNIT/ML IJ SOLN
INTRAMUSCULAR | Status: DC | PRN
  Administered 2018-12-04: 3500 [IU] via INTRAVENOUS

## 2018-12-04 MED ORDER — SODIUM CHLORIDE 0.9 % WEIGHT BASED INFUSION: 3.0000 mL/kg/h | INTRAVENOUS | Status: DC

## 2018-12-04 SURGICAL SUPPLY — 9 items
CATH 5FR JL3.5 JR4 ANG PIG MP (CATHETERS) ×4 IMPLANT
DEVICE RAD COMP TR BAND LRG (VASCULAR PRODUCTS) ×2 IMPLANT
GLIDESHEATH SLEND A-KIT 6F 22G (SHEATH) ×2 IMPLANT
GUIDEWIRE INQWIRE 1.5J.035X260 (WIRE) ×1 IMPLANT
INQWIRE 1.5J .035X260CM (WIRE) ×2
KIT HEART LEFT (KITS) ×2 IMPLANT
PACK CARDIAC CATHETERIZATION (CUSTOM PROCEDURE TRAY) ×2 IMPLANT
TRANSDUCER W/STOPCOCK (MISCELLANEOUS) ×2 IMPLANT
TUBING CIL FLEX 10 FLL-RA (TUBING) ×2 IMPLANT

## 2018-12-04 NOTE — Progress Notes (Signed)
1530 : Unable to remove air from TR band without oozing  1600 : Cath lab team held pressure, repositioned TR band, applied 11cc of air in TR band  1600 : Removed 2 cc of air from TR band. No oozing. Site remains level 0.    Fritz Pickerel, RN

## 2018-12-04 NOTE — Progress Notes (Signed)
ANTICOAGULATION CONSULT NOTE - Follow Up Consult  Pharmacy Consult for heparin Indication: NSTEMI   Labs: Recent Labs    12/03/18 1447 12/03/18 2355 12/04/18 0522  HGB 16.1*  --  14.3  HCT 48.2*  --  41.7  PLT 229  --  200  HEPARINUNFRC  --   --  0.72*  CREATININE 1.05*  --   --   TROPONINI  --  0.15*  --     Assessment: 77yo female slightly supratherapeutic on heparin with initial dosing for NSTEMI; RN reports some mild bleeding when pt's IV came out that was easily controlled.  Goal of Therapy:  Heparin level 0.3-0.7 units/ml   Plan:  Will decrease heparin gtt by 1 units/kg/hr to 750 units/hr and check level in 8 hours.    Wynona Neat, PharmD, BCPS  12/04/2018,6:26 AM

## 2018-12-04 NOTE — Progress Notes (Signed)
  Echocardiogram 2D Echocardiogram has been performed.  Stephanie Newton 12/04/2018, 8:51 AM

## 2018-12-04 NOTE — Interval H&P Note (Signed)
Cath Lab Visit (complete for each Cath Lab visit)  Clinical Evaluation Leading to the Procedure:   ACS: Yes.    Non-ACS:    Anginal Classification: CCS Newton  Anti-ischemic medical therapy: Minimal Therapy (1 class of medications)  Non-Invasive Test Results: No non-invasive testing performed  Prior CABG: No previous CABG      History and Physical Interval Note:  12/04/2018 12:36 PM  Stephanie Newton  has presented today for surgery, with the diagnosis of ns  The various methods of treatment have been discussed with the patient and family. After consideration of risks, benefits and other options for treatment, the patient has consented to  Procedure(s): LEFT HEART CATH AND CORONARY ANGIOGRAPHY (N/A) as a surgical intervention .  The patient's history has been reviewed, patient examined, no change in status, stable for surgery.  I have reviewed the patient's chart and labs.  Questions were answered to the patient's satisfaction.     Stephanie Newton

## 2018-12-04 NOTE — Progress Notes (Signed)
Progress Note  Patient Name: Stephanie Newton Date of Encounter: 12/04/2018  Primary Cardiologist: New  Subjective   Feeling well this AM. No chest pain. Describes episode yesterday of chest pressure radiating to neck and left arm, with slight diaphoresis accompanying it. Has never had before. Has had palpitations for many years, since high school, several times per year.   Discussed MI, cath, medications at length. Nothing to eat since Saturday. Amenable to cath.  Inpatient Medications    Scheduled Meds: . aspirin EC  81 mg Oral Daily  . simvastatin  20 mg Oral q1800   Continuous Infusions: . heparin 750 Units/hr (12/04/18 5631)   PRN Meds: acetaminophen   Vital Signs    Vitals:   12/03/18 2125 12/03/18 2300 12/04/18 0546 12/04/18 0736  BP:  119/67 126/76 139/68  Pulse:  77 78 73  Resp:  (!) 23 13 20   Temp: 98.7 F (37.1 C) 97.9 F (36.6 C) 98 F (36.7 C) 98 F (36.7 C)  TempSrc: Oral Oral Oral Oral  SpO2:  100% 99% 98%  Weight:      Height:        Intake/Output Summary (Last 24 hours) at 12/04/2018 0933 Last data filed at 12/04/2018 0522 Gross per 24 hour  Intake 1090.9 ml  Output 350 ml  Net 740.9 ml   Filed Weights   12/03/18 2100  Weight: 67.6 kg    Telemetry    NSR - Personally Reviewed  ECG    NSR - Personally Reviewed  Physical Exam   GEN: No acute distress.   Neck: No JVD Cardiac: RRR, no murmurs, rubs, or gallops.  Respiratory: Clear to auscultation bilaterally. GI: Soft, nontender, non-distended  MS: No edema; No deformity. Neuro:  Nonfocal  Psych: Normal affect   Labs    Chemistry Recent Labs  Lab 12/03/18 1447 12/04/18 0522  NA 140 140  K 4.3 3.7  CL 103 108  CO2 22 22  GLUCOSE 136* 94  BUN 11 9  CREATININE 1.05* 0.84  CALCIUM 10.0 8.9  GFRNONAA 52* >60  GFRAA >60 >60  ANIONGAP 15 10     Hematology Recent Labs  Lab 12/03/18 1447 12/04/18 0522  WBC 6.2 5.5  RBC 5.30* 4.74  HGB 16.1* 14.3  HCT 48.2*  41.7  MCV 90.9 88.0  MCH 30.4 30.2  MCHC 33.4 34.3  RDW 14.8 14.3  PLT 229 200    Cardiac Enzymes Recent Labs  Lab 12/03/18 2355 12/04/18 0522  TROPONINI 0.15* 0.11*    Recent Labs  Lab 12/03/18 1522 12/03/18 1928  TROPIPOC 0.01 0.22*     BNP Recent Labs  Lab 12/03/18 2355  BNP 68.2     DDimer  Recent Labs  Lab 12/03/18 1447  DDIMER 2.51*     Radiology    Ct Angio Chest Pe W And/or Wo Contrast  Result Date: 12/03/2018 CLINICAL DATA:  Palpitations. EXAM: CT ANGIOGRAPHY CHEST WITH CONTRAST TECHNIQUE: Multidetector CT imaging of the chest was performed using the standard protocol during bolus administration of intravenous contrast. Multiplanar CT image reconstructions and MIPs were obtained to evaluate the vascular anatomy. CONTRAST:  181mL ISOVUE-370 IOPAMIDOL (ISOVUE-370) INJECTION 76% COMPARISON:  Chest x-ray dated 12/03/2018 FINDINGS: Cardiovascular: Satisfactory opacification of the pulmonary arteries to the segmental level. No evidence of pulmonary embolism. Normal heart size. No pericardial effusion. Mediastinum/Nodes: No enlarged mediastinal, hilar, or axillary lymph nodes. Thyroid gland, trachea, and esophagus demonstrate no significant findings. Lungs/Pleura: There is an ill-defined 9 mm nodule  in the superior segment of the left lower lobe on image 38 and an 8 mm slightly lobulated nodule in the left lower lobe posterior medially on image number 93. There is a 5 mm ill-defined nodule in the right upper lobe on image 37. All of these are visible on series 6 and series 7. No infiltrates or effusions. Upper Abdomen: 3.4 cm low-density lesion in the upper pole of the right kidney, most likely a cyst. 12 mm low-density lesion in the anterior aspect of the left lobe of the liver, probably a cyst. Diverticulosis of the splenic flexure of the colon. Cholecystectomy. Musculoskeletal: No chest wall abnormality. No acute or significant osseous findings. Review of the MIP images  confirms the above findings. IMPRESSION: 1. No pulmonary emboli or other acute abnormalities. 2. Multiple bilateral pulmonary nodules. Non-contrast chest CT at 3 months is recommended. If the nodules are stable at time of repeat CT, then future CT at 18-24 months (from today's scan) is considered optional for low-risk patients, but is recommended for high-risk patients. This recommendation follows the consensus statement: Guidelines for Management of Incidental Pulmonary Nodules Detected on CT Images: From the Fleischner Society 2017; Radiology 2017; 284:228-243. Electronically Signed   By: Lorriane Shire M.D.   On: 12/03/2018 18:37   Dg Chest Port 1 View  Result Date: 12/03/2018 CLINICAL DATA:  Heart palpitations today EXAM: PORTABLE CHEST 1 VIEW COMPARISON:  03/16/2014 FINDINGS: The heart size and mediastinal contours are within normal limits. Both lungs are clear. The visualized skeletal structures are unremarkable. IMPRESSION: No active disease. Electronically Signed   By: Ashley Royalty M.D.   On: 12/03/2018 15:11    Cardiac Studies   Echo pending  Patient Profile     77 y.o. female with PMH hypertension, hyperlipidemia, tobacco abuse who presents with NSTEMI.  Assessment & Plan    NSTEMI: -risk factors: hypertension, tobacco use, hyperlipidemia. No significant family history -chest pain free, on heparin drip -on simvastatin at home, LDL 74. Will change to atorvastatin 80 mg today -started on baby aspirin -on atenolol at home, continue -echo pending -tobacco abuse counseling, as below -discussed risk and benefit of cath. She is amenable.  Risks and benefits of cardiac catheterization have been discussed with the patient.  These include bleeding, infection, kidney damage, stroke, heart attack, death.  The patient understands these risks and is willing to proceed.  -no active bleeding, no contraindications to DAPT.  Hypertension: well controlled, holding HCTZ and ACEi currently. Will  restart post cath  Hyperlipidemia: was on simvastatin at home, changing to atorvastatin  Tobacco abuse: 6 cigarettes/day. Has had success in the past quitting cold [place], has been abstinent for several months with this. The patient was counseled on tobacco cessation today for 7 minutes.  Counseling included reviewing the risks of smoking tobacco products, how it impacts the patient's current medical diagnoses and different strategies for quitting.  Pharmacotherapy to aid in tobacco cessation was not prescribed today.  For questions or updates, please contact [place] Please consult [email or web address] for contact info under     Signed, Buford Dresser, MD  12/04/2018, 9:33 AM

## 2018-12-04 NOTE — H&P (View-Only) (Signed)
Progress Note  Patient Name: Stephanie Newton Date of Encounter: 12/04/2018  Primary Cardiologist: New  Subjective   Feeling well this AM. No chest pain. Describes episode yesterday of chest pressure radiating to neck and left arm, with slight diaphoresis accompanying it. Has never had before. Has had palpitations for many years, since high school, several times per year.   Discussed MI, cath, medications at length. Nothing to eat since Saturday. Amenable to cath.  Inpatient Medications    Scheduled Meds: . aspirin EC  81 mg Oral Daily  . simvastatin  20 mg Oral q1800   Continuous Infusions: . heparin 750 Units/hr (12/04/18 4627)   PRN Meds: acetaminophen   Vital Signs    Vitals:   12/03/18 2125 12/03/18 2300 12/04/18 0546 12/04/18 0736  BP:  119/67 126/76 139/68  Pulse:  77 78 73  Resp:  (!) 23 13 20   Temp: 98.7 F (37.1 C) 97.9 F (36.6 C) 98 F (36.7 C) 98 F (36.7 C)  TempSrc: Oral Oral Oral Oral  SpO2:  100% 99% 98%  Weight:      Height:        Intake/Output Summary (Last 24 hours) at 12/04/2018 0933 Last data filed at 12/04/2018 0522 Gross per 24 hour  Intake 1090.9 ml  Output 350 ml  Net 740.9 ml   Filed Weights   12/03/18 2100  Weight: 67.6 kg    Telemetry    NSR - Personally Reviewed  ECG    NSR - Personally Reviewed  Physical Exam   GEN: No acute distress.   Neck: No JVD Cardiac: RRR, no murmurs, rubs, or gallops.  Respiratory: Clear to auscultation bilaterally. GI: Soft, nontender, non-distended  MS: No edema; No deformity. Neuro:  Nonfocal  Psych: Normal affect   Labs    Chemistry Recent Labs  Lab 12/03/18 1447 12/04/18 0522  NA 140 140  K 4.3 3.7  CL 103 108  CO2 22 22  GLUCOSE 136* 94  BUN 11 9  CREATININE 1.05* 0.84  CALCIUM 10.0 8.9  GFRNONAA 52* >60  GFRAA >60 >60  ANIONGAP 15 10     Hematology Recent Labs  Lab 12/03/18 1447 12/04/18 0522  WBC 6.2 5.5  RBC 5.30* 4.74  HGB 16.1* 14.3  HCT 48.2*  41.7  MCV 90.9 88.0  MCH 30.4 30.2  MCHC 33.4 34.3  RDW 14.8 14.3  PLT 229 200    Cardiac Enzymes Recent Labs  Lab 12/03/18 2355 12/04/18 0522  TROPONINI 0.15* 0.11*    Recent Labs  Lab 12/03/18 1522 12/03/18 1928  TROPIPOC 0.01 0.22*     BNP Recent Labs  Lab 12/03/18 2355  BNP 68.2     DDimer  Recent Labs  Lab 12/03/18 1447  DDIMER 2.51*     Radiology    Ct Angio Chest Pe W And/or Wo Contrast  Result Date: 12/03/2018 CLINICAL DATA:  Palpitations. EXAM: CT ANGIOGRAPHY CHEST WITH CONTRAST TECHNIQUE: Multidetector CT imaging of the chest was performed using the standard protocol during bolus administration of intravenous contrast. Multiplanar CT image reconstructions and MIPs were obtained to evaluate the vascular anatomy. CONTRAST:  131mL ISOVUE-370 IOPAMIDOL (ISOVUE-370) INJECTION 76% COMPARISON:  Chest x-ray dated 12/03/2018 FINDINGS: Cardiovascular: Satisfactory opacification of the pulmonary arteries to the segmental level. No evidence of pulmonary embolism. Normal heart size. No pericardial effusion. Mediastinum/Nodes: No enlarged mediastinal, hilar, or axillary lymph nodes. Thyroid gland, trachea, and esophagus demonstrate no significant findings. Lungs/Pleura: There is an ill-defined 9 mm nodule  in the superior segment of the left lower lobe on image 38 and an 8 mm slightly lobulated nodule in the left lower lobe posterior medially on image number 93. There is a 5 mm ill-defined nodule in the right upper lobe on image 37. All of these are visible on series 6 and series 7. No infiltrates or effusions. Upper Abdomen: 3.4 cm low-density lesion in the upper pole of the right kidney, most likely a cyst. 12 mm low-density lesion in the anterior aspect of the left lobe of the liver, probably a cyst. Diverticulosis of the splenic flexure of the colon. Cholecystectomy. Musculoskeletal: No chest wall abnormality. No acute or significant osseous findings. Review of the MIP images  confirms the above findings. IMPRESSION: 1. No pulmonary emboli or other acute abnormalities. 2. Multiple bilateral pulmonary nodules. Non-contrast chest CT at 3 months is recommended. If the nodules are stable at time of repeat CT, then future CT at 18-24 months (from today's scan) is considered optional for low-risk patients, but is recommended for high-risk patients. This recommendation follows the consensus statement: Guidelines for Management of Incidental Pulmonary Nodules Detected on CT Images: From the Fleischner Society 2017; Radiology 2017; 284:228-243. Electronically Signed   By: Lorriane Shire M.D.   On: 12/03/2018 18:37   Dg Chest Port 1 View  Result Date: 12/03/2018 CLINICAL DATA:  Heart palpitations today EXAM: PORTABLE CHEST 1 VIEW COMPARISON:  03/16/2014 FINDINGS: The heart size and mediastinal contours are within normal limits. Both lungs are clear. The visualized skeletal structures are unremarkable. IMPRESSION: No active disease. Electronically Signed   By: Ashley Royalty M.D.   On: 12/03/2018 15:11    Cardiac Studies   Echo pending  Patient Profile     77 y.o. female with PMH hypertension, hyperlipidemia, tobacco abuse who presents with NSTEMI.  Assessment & Plan    NSTEMI: -risk factors: hypertension, tobacco use, hyperlipidemia. No significant family history -chest pain free, on heparin drip -on simvastatin at home, LDL 74. Will change to atorvastatin 80 mg today -started on baby aspirin -on atenolol at home, continue -echo pending -tobacco abuse counseling, as below -discussed risk and benefit of cath. She is amenable.  Risks and benefits of cardiac catheterization have been discussed with the patient.  These include bleeding, infection, kidney damage, stroke, heart attack, death.  The patient understands these risks and is willing to proceed.  -no active bleeding, no contraindications to DAPT.  Hypertension: well controlled, holding HCTZ and ACEi currently. Will  restart post cath  Hyperlipidemia: was on simvastatin at home, changing to atorvastatin  Tobacco abuse: 6 cigarettes/day. Has had success in the past quitting cold [place], has been abstinent for several months with this. The patient was counseled on tobacco cessation today for 7 minutes.  Counseling included reviewing the risks of smoking tobacco products, how it impacts the patient's current medical diagnoses and different strategies for quitting.  Pharmacotherapy to aid in tobacco cessation was not prescribed today.  For questions or updates, please contact [place] Please consult [email or web address] for contact info under     Signed, Buford Dresser, MD  12/04/2018, 9:33 AM

## 2018-12-04 NOTE — Progress Notes (Signed)
[facility] Progress Note  Patient: Stephanie Newton OFH:[id number]   PCP: Lajean Manes, MD DOB: 1941/09/15   DOA: 12/03/2018   DOS: 12/04/2018   Date of Service: the patient was seen and examined on 12/04/2018  Brief hospital course: Pt. with PMH of HTN, HLD, osteoarthritis; admitted on 12/03/2018, presented with complaint of palpitation and chest pain, was found to have demand ischemia. Currently further plan is continue to monitor on telemetry.  Subjective: Seen after cardiac catheterization.  No chest pain no nausea no vomiting no fever no chills.  No shortness of breath.  Feels fatigued and tired. Reports that she is having this palpitation episodes 2-3 times in a year.  The frequency is not increasing lately.  This time her episode lasted the longest and along with that she had some chest pain as well as left arm cramping and perspiration and therefore decided to come to the hospital.  Assessment and Plan: 1.  Chest pain. Typical. Likely secondary to demand ischemia from her palpitation event. Although unable to capture any arrhythmias or tachycardia on telemetry so far. Underwent cardiac catheterization, no significant coronary disease.  Medical management recommended. Atenolol dose increased from 50 mg to 100 mg.  Monitor. Check lipid profile.  2.  Essential hypertension. Patient is on multiple antihypertensive medication at home although not at its full dose. Atenolol dose increased from 5200. On HCTZ as well as lisinopril which is currently on hold. Monitor.  3.  Ramus intermedius aneurysm. Cardiology recommends CT angiogram.  Timing of which would be decided by them. Although patient did have a CT chest angiogram PE protocol which was negative for any significant abnormalities identified on that.  The study was not designed to look at coronary arteries.  4.  Palpitation. Patient reports that she is having this palpitation 2-3 times a year ever since her college  years. This time she was symptomatic with them. This generally does not last long but this time it lasted longer. Patient will benefit from at least an event monitor and ideally loop recorder to identify the rhythm. We will let cardiology decide on that.  5.  Bilateral lung nodules. Subcentimeter. Will need a repeat CT scan in 3 months.  Diet: Cardiac diet DVT Prophylaxis: subcutaneous Heparin  Advance goals of care discussion: full code  Family Communication: family was present at bedside, at the time of interview. The pt provided permission to discuss medical plan with the family. Opportunity was given to ask question and all questions were answered satisfactorily.   Disposition:  Discharge to home tomorrow.  Consultants: cardiology  Procedures: Echocardiogram  Cardiac Catheterization   Scheduled Meds: . aspirin EC  81 mg Oral Daily  . atenolol  100 mg Oral Daily  . atorvastatin  80 mg Oral q1800  . heparin  5,000 Units Subcutaneous Q8H  . sodium chloride flush  3 mL Intravenous Q12H   Continuous Infusions: . sodium chloride 125 mL/hr at 12/04/18 1415  . sodium chloride     PRN Meds: sodium chloride, acetaminophen, acetaminophen, ondansetron (ZOFRAN) IV, sodium chloride flush Antibiotics: Anti-infectives (From admission, onward)   None       Objective: Physical Exam: Vitals:   12/04/18 1445 12/04/18 1520 12/04/18 1530 12/04/18 1645  BP:  (!) 149/74 (!) 153/86 (!) 129/95  Pulse: 65 68 72 67  Resp: 12 (!) 25 18 20   Temp:  (!) 97.5 F (36.4 C)    TempSrc:  Oral    SpO2: 98% 96% 94% 99%  Weight:  Height:        Intake/Output Summary (Last 24 hours) at 12/04/2018 1713 Last data filed at 12/04/2018 1500 Gross per 24 hour  Intake 1232.22 ml  Output 350 ml  Net 882.22 ml   Filed Weights   12/03/18 2100  Weight: 67.6 kg   General: Alert, Awake and Oriented to Time, Place and Person. Appear in mild distress, affect appropriate Eyes: PERRL, Conjunctiva  normal ENT: Oral Mucosa clear moist. Neck: no JVD, no Abnormal Mass Or lumps Cardiovascular: S1 and S2 Present, no Murmur, Peripheral Pulses Present Respiratory: normal respiratory effort, Bilateral Air entry equal and Decreased, no use of accessory muscle, Clear to Auscultation, no Crackles, no wheezes Abdomen: Bowel Sound present, Soft and no tenderness, no hernia Skin: no redness, no Rash, no induration Extremities: no Pedal edema, no calf tenderness Neurologic: Grossly no focal neuro deficit. Bilaterally Equal motor strength  Data Reviewed: CBC: Recent Labs  Lab 12/03/18 1447 12/04/18 0522  WBC 6.2 5.5  HGB 16.1* 14.3  HCT 48.2* 41.7  MCV 90.9 88.0  PLT 229 595   Basic Metabolic Panel: Recent Labs  Lab 12/03/18 1447 12/04/18 0522  NA 140 140  K 4.3 3.7  CL 103 108  CO2 22 22  GLUCOSE 136* 94  BUN 11 9  CREATININE 1.05* 0.84  CALCIUM 10.0 8.9  MG 1.9 1.9    Liver Function Tests: No results for input(s): AST, ALT, ALKPHOS, BILITOT, PROT, ALBUMIN in the last 168 hours. No results for input(s): LIPASE, AMYLASE in the last 168 hours. No results for input(s): AMMONIA in the last 168 hours. Coagulation Profile: No results for input(s): INR, PROTIME in the last 168 hours. Cardiac Enzymes: Recent Labs  Lab 12/03/18 2355 12/04/18 0522  TROPONINI 0.15* 0.11*   BNP (last 3 results) No results for input(s): PROBNP in the last 8760 hours. CBG: No results for input(s): GLUCAP in the last 168 hours. Studies: Ct Angio Chest Pe W And/or Wo Contrast  Result Date: 12/03/2018 CLINICAL DATA:  Palpitations. EXAM: CT ANGIOGRAPHY CHEST WITH CONTRAST TECHNIQUE: Multidetector CT imaging of the chest was performed using the standard protocol during bolus administration of intravenous contrast. Multiplanar CT image reconstructions and MIPs were obtained to evaluate the vascular anatomy. CONTRAST:  171mL ISOVUE-370 IOPAMIDOL (ISOVUE-370) INJECTION 76% COMPARISON:  Chest x-ray dated  12/03/2018 FINDINGS: Cardiovascular: Satisfactory opacification of the pulmonary arteries to the segmental level. No evidence of pulmonary embolism. Normal heart size. No pericardial effusion. Mediastinum/Nodes: No enlarged mediastinal, hilar, or axillary lymph nodes. Thyroid gland, trachea, and esophagus demonstrate no significant findings. Lungs/Pleura: There is an ill-defined 9 mm nodule in the superior segment of the left lower lobe on image 38 and an 8 mm slightly lobulated nodule in the left lower lobe posterior medially on image number 93. There is a 5 mm ill-defined nodule in the right upper lobe on image 37. All of these are visible on series 6 and series 7. No infiltrates or effusions. Upper Abdomen: 3.4 cm low-density lesion in the upper pole of the right kidney, most likely a cyst. 12 mm low-density lesion in the anterior aspect of the left lobe of the liver, probably a cyst. Diverticulosis of the splenic flexure of the colon. Cholecystectomy. Musculoskeletal: No chest wall abnormality. No acute or significant osseous findings. Review of the MIP images confirms the above findings. IMPRESSION: 1. No pulmonary emboli or other acute abnormalities. 2. Multiple bilateral pulmonary nodules. Non-contrast chest CT at 3 months is recommended. If the nodules are stable  at time of repeat CT, then future CT at 18-24 months (from today's scan) is considered optional for low-risk patients, but is recommended for high-risk patients. This recommendation follows the consensus statement: Guidelines for Management of Incidental Pulmonary Nodules Detected on CT Images: From the Fleischner Society 2017; Radiology 2017; 284:228-243. Electronically Signed   By: Lorriane Shire M.D.   On: 12/03/2018 18:37     Time spent: 35 minutes  Author: Berle Mull, MD [facility] Pager: [phone] 12/04/2018 5:13 PM  Between 7PM-7AM, please contact night-coverage at [email or web address], password [facility]

## 2018-12-04 NOTE — CV Procedure (Signed)
   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  This would be classified as a giant aneurysm.  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  Left coronary injection leads to shunting was sufficient volume to opacify the left ventricle.  Normal left main.  Normal LAD.  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  Normal left ventricular systolic function with LVEF 70%.  RECOMMENDATIONS:   Aggressive blood pressure control which should include beta-blocker therapy.  Therapy of hyperlipidemia if present.  Treatment of aneurysm will be considered.  Management will likely be surgical as stenting and coiling in this proximal segment with a large neck would present significant technical problems.  Needs coronary CT angios performed to accurately size the aneurysm and can then be used to follow over time for evidence of growth. 

## 2018-12-05 ENCOUNTER — Other Ambulatory Visit: Payer: Self-pay | Admitting: Cardiology

## 2018-12-05 DIAGNOSIS — R002 Palpitations: Secondary | ICD-10-CM | POA: Diagnosis not present

## 2018-12-05 DIAGNOSIS — I214 Non-ST elevation (NSTEMI) myocardial infarction: Secondary | ICD-10-CM | POA: Diagnosis not present

## 2018-12-05 DIAGNOSIS — I2541 Coronary artery aneurysm: Secondary | ICD-10-CM

## 2018-12-05 DIAGNOSIS — R911 Solitary pulmonary nodule: Secondary | ICD-10-CM | POA: Diagnosis not present

## 2018-12-05 DIAGNOSIS — N183 Chronic kidney disease, stage 3 unspecified: Secondary | ICD-10-CM

## 2018-12-05 DIAGNOSIS — Z720 Tobacco use: Secondary | ICD-10-CM

## 2018-12-05 DIAGNOSIS — R7989 Other specified abnormal findings of blood chemistry: Secondary | ICD-10-CM | POA: Diagnosis not present

## 2018-12-05 DIAGNOSIS — I10 Essential (primary) hypertension: Secondary | ICD-10-CM

## 2018-12-05 DIAGNOSIS — E785 Hyperlipidemia, unspecified: Secondary | ICD-10-CM

## 2018-12-05 LAB — BASIC METABOLIC PANEL
Anion gap: 10 (ref 5–15)
BUN: 9 mg/dL (ref 8–23)
CHLORIDE: 109 mmol/L (ref 98–111)
CO2: 22 mmol/L (ref 22–32)
Calcium: 8.9 mg/dL (ref 8.9–10.3)
Creatinine, Ser: 0.77 mg/dL (ref 0.44–1.00)
GFR calc Af Amer: >60 mL/min (ref >60)
GFR calc non Af Amer: >60 mL/min (ref >60)
Glucose, Bld: 98 mg/dL (ref 70–99)
Potassium: 3.6 mmol/L (ref 3.5–5.1)
Sodium: 141 mmol/L (ref 135–145)

## 2018-12-05 LAB — CBC
HCT: 39.8 % (ref 36.0–46.0)
HEMOGLOBIN: 13.4 g/dL (ref 12.0–15.0)
MCH: 30.2 pg (ref 26.0–34.0)
MCHC: 33.7 g/dL (ref 30.0–36.0)
MCV: 89.8 fL (ref 80.0–100.0)
Platelets: 187 10*3/uL (ref 150–400)
RBC: 4.43 MIL/uL (ref 3.87–5.11)
RDW: 14.6 % (ref 11.5–15.5)
WBC: 4 10*3/uL (ref 4.0–10.5)
nRBC: 0 % (ref 0.0–0.2)

## 2018-12-05 LAB — MAGNESIUM: Magnesium: 2 mg/dL (ref 1.7–2.4)

## 2018-12-05 MED ORDER — ATENOLOL 100 MG PO TABS: 100.0000 mg | ORAL_TABLET | Freq: Every day | ORAL | 0 refills | Status: DC

## 2018-12-05 MED ORDER — ASPIRIN 81 MG PO TBEC: 81.0000 mg | DELAYED_RELEASE_TABLET | Freq: Every day | ORAL | 0 refills | Status: DC

## 2018-12-05 MED ORDER — ATORVASTATIN CALCIUM 40 MG PO TABS: 40.0000 mg | ORAL_TABLET | Freq: Every day | ORAL | Status: DC

## 2018-12-05 MED ORDER — ATORVASTATIN CALCIUM 40 MG PO TABS: 40.0000 mg | ORAL_TABLET | Freq: Every day | ORAL | 0 refills | Status: DC

## 2018-12-05 MED ORDER — LISINOPRIL 10 MG PO TABS
10.0000 mg | ORAL_TABLET | Freq: Every day | ORAL | Status: DC
  Administered 2018-12-05: 10 mg via ORAL
  Filled 2018-12-05: qty 1

## 2018-12-05 MED FILL — Lidocaine HCl Local Preservative Free (PF) Inj 1%: INTRAMUSCULAR | Qty: 30 | Status: AC

## 2018-12-05 NOTE — Progress Notes (Addendum)
Progress Note  Patient Name: Stephanie Newton Date of Encounter: 12/05/2018  Primary Cardiologist: Dr. Pernell Dupre (patient preference)  Subjective   Reviewed results of cath from yesterday, discussed plans for follow up at length. Feeling well, no chest pain or palpitations  Inpatient Medications    Scheduled Meds: . aspirin EC  81 mg Oral Daily  . atenolol  100 mg Oral Daily  . atorvastatin  40 mg Oral q1800  . heparin  5,000 Units Subcutaneous Q8H  . lisinopril  10 mg Oral Daily  . sodium chloride flush  3 mL Intravenous Q12H   Continuous Infusions: . sodium chloride     PRN Meds: sodium chloride, acetaminophen, acetaminophen, ondansetron (ZOFRAN) IV, sodium chloride flush   Vital Signs    Vitals:   12/04/18 1919 12/04/18 2330 12/05/18 0414 12/05/18 0725  BP: (!) 141/79 137/74 135/72 138/70  Pulse: 67 70 71 65  Resp: 20 18 19 16   Temp: 97.8 F (36.6 C) 97.9 F (36.6 C) 97.9 F (36.6 C) 98 F (36.7 C)  TempSrc: Oral Oral Oral Oral  SpO2: 100% 99% 96% 98%  Weight:      Height:        Intake/Output Summary (Last 24 hours) at 12/05/2018 1007 Last data filed at 12/05/2018 0415 Gross per 24 hour  Intake 547.23 ml  Output 0 ml  Net 547.23 ml   Filed Weights   12/03/18 2100  Weight: 67.6 kg    Telemetry    NSR - Personally Reviewed  ECG    NSR - Personally Reviewed  Physical Exam   GEN: No acute distress.   Neck: No JVD Cardiac: RRR, no murmurs, rubs, or gallops. Radial cath site c/d/i Respiratory: Clear to auscultation bilaterally. GI: Soft, nontender, non-distended  MS: No edema; No deformity. Neuro:  Nonfocal  Psych: Normal affect   Labs    Chemistry Recent Labs  Lab 12/03/18 1447 12/04/18 0522 12/05/18 0250  NA 140 140 141  K 4.3 3.7 3.6  CL 103 108 109  CO2 22 22 22   GLUCOSE 136* 94 98  BUN 11 9 9   CREATININE 1.05* 0.84 0.77  CALCIUM 10.0 8.9 8.9  GFRNONAA 52* >60 >60  GFRAA >60 >60 >60  ANIONGAP 15 10 10       Hematology Recent Labs  Lab 12/03/18 1447 12/04/18 0522 12/05/18 0250  WBC 6.2 5.5 4.0  RBC 5.30* 4.74 4.43  HGB 16.1* 14.3 13.4  HCT 48.2* 41.7 39.8  MCV 90.9 88.0 89.8  MCH 30.4 30.2 30.2  MCHC 33.4 34.3 33.7  RDW 14.8 14.3 14.6  PLT 229 200 187    Cardiac Enzymes Recent Labs  Lab 12/03/18 2355 12/04/18 0522  TROPONINI 0.15* 0.11*    Recent Labs  Lab 12/03/18 1522 12/03/18 1928  TROPIPOC 0.01 0.22*     BNP Recent Labs  Lab 12/03/18 2355  BNP 68.2     DDimer  Recent Labs  Lab 12/03/18 1447  DDIMER 2.51*     Radiology    Ct Angio Chest Pe W And/or Wo Contrast  Result Date: 12/03/2018 CLINICAL DATA:  Palpitations. EXAM: CT ANGIOGRAPHY CHEST WITH CONTRAST TECHNIQUE: Multidetector CT imaging of the chest was performed using the standard protocol during bolus administration of intravenous contrast. Multiplanar CT image reconstructions and MIPs were obtained to evaluate the vascular anatomy. CONTRAST:  128mL ISOVUE-370 IOPAMIDOL (ISOVUE-370) INJECTION 76% COMPARISON:  Chest x-ray dated 12/03/2018 FINDINGS: Cardiovascular: Satisfactory opacification of the pulmonary arteries to the segmental level. No  evidence of pulmonary embolism. Normal heart size. No pericardial effusion. Mediastinum/Nodes: No enlarged mediastinal, hilar, or axillary lymph nodes. Thyroid gland, trachea, and esophagus demonstrate no significant findings. Lungs/Pleura: There is an ill-defined 9 mm nodule in the superior segment of the left lower lobe on image 38 and an 8 mm slightly lobulated nodule in the left lower lobe posterior medially on image number 93. There is a 5 mm ill-defined nodule in the right upper lobe on image 37. All of these are visible on series 6 and series 7. No infiltrates or effusions. Upper Abdomen: 3.4 cm low-density lesion in the upper pole of the right kidney, most likely a cyst. 12 mm low-density lesion in the anterior aspect of the left lobe of the liver, probably a  cyst. Diverticulosis of the splenic flexure of the colon. Cholecystectomy. Musculoskeletal: No chest wall abnormality. No acute or significant osseous findings. Review of the MIP images confirms the above findings. IMPRESSION: 1. No pulmonary emboli or other acute abnormalities. 2. Multiple bilateral pulmonary nodules. Non-contrast chest CT at 3 months is recommended. If the nodules are stable at time of repeat CT, then future CT at 18-24 months (from today's scan) is considered optional for low-risk patients, but is recommended for high-risk patients. This recommendation follows the consensus statement: Guidelines for Management of Incidental Pulmonary Nodules Detected on CT Images: From the Fleischner Society 2017; Radiology 2017; 284:228-243. Electronically Signed   By: Lorriane Shire M.D.   On: 12/03/2018 18:37   Dg Chest Port 1 View  Result Date: 12/03/2018 CLINICAL DATA:  Heart palpitations today EXAM: PORTABLE CHEST 1 VIEW COMPARISON:  03/16/2014 FINDINGS: The heart size and mediastinal contours are within normal limits. Both lungs are clear. The visualized skeletal structures are unremarkable. IMPRESSION: No active disease. Electronically Signed   By: Ashley Royalty M.D.   On: 12/03/2018 15:11    Cardiac Studies   Echo 12/04/18 - Left ventricle: The cavity size was normal. Wall thickness was   increased in a pattern of mild LVH. Systolic function was   vigorous. The estimated ejection fraction was in the range of 65%   to 70%. Wall motion was normal; there were no regional wall   motion abnormalities. Doppler parameters are consistent with   abnormal left ventricular relaxation (grade 1 diastolic   dysfunction).  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  This would be classified as a giant aneurysm.  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 Left coronary injection leads to shunting of  sufficient volume to opacify the left ventricle.  Normal left main.  Normal LAD.  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  Normal left ventricular systolic function with LVEF 70%.  Further troponin likely related to supply demand mismatch in the setting of above anatomy and tachycardia related to arrhythmia.  RECOMMENDATIONS:   Aggressive blood pressure control which should include beta-blocker therapy.  Therapy of hyperlipidemia if present.  Treatment of aneurysm will be considered.  Management will likely be surgical as stenting and coiling in this proximal segment with a large neck would present significant technical problems.  Needs coronary CT angios performed to accurately size the aneurysm and can then be used to follow over time for evidence of growth.  Patient Profile     77 y.o. female with PMH hypertension, hyperlipidemia, tobacco abuse who presents with NSTEMI. Found to have aneurysm and coronary fistulas.  Assessment & Plan    NSTEMI:  no obstructive CAD on cath, but coronary aneurysm and coronary fistulas seen -risk factors: hypertension, tobacco use, hyperlipidemia. No significant family history -counseled on tobacco cessation -continue aspirin 81 mg, change to atorvastatin 40 mg -I discussed anticoagulation given the size of the coronary aneurysm. Discussed risk of thrombosis and that as a potential cause of MI. She would like to avoid anticoagulation if possible, but would continue to discuss. Will hold on DAPT for NSTEMI pending follow up discussion re: anticoagulation as an outpatient. -on atenolol at home, continue. She was on 100 mg previously, then dropped to 50 mg after some unexplained weight loss. She is willing to try 100 mg daily again, but if she is experiencing fatigue can drop back to 50 mg daily at follow up. -echo as above -tobacco abuse counseling -discussed risk and benefit of cath. She is amenable. -plan is for coronary  CTA to size aneurysm, will order, to be done as outpatient.  -Dr. Tamala Julian plans to review in cath conference -likely repeat coronary CTA in 6 mos to monitor for growth  Hypertension: needs excellent control -restarted atenolol 100 mg, monitor (can drop to 50 mg if too much fatigue on follow up) -restarting lisinopril 10 mg today -can restart HCTZ at discharge  Hyperlipidemia: was on simvastatin at home, changing to atorvastatin 40 mg  Tobacco abuse: 6 cigarettes/day. Has had success in the past quitting cold [place], has been abstinent for several months with this. Counseled on importance of cessation.  Palpitations: discussed at length. Very infrequent. No syncope or injury with these, and up until most recent episode, have been mild. Would try 30 day holter at discharge, but we may still not catch an episode. I'm not sure that a loop recorder is necessary unless she continues to have severe events.  If she has a recurrent event as severe as the one this time, I instructed her to come to ER. If her markers are up, I would encourage anticoagulation. Also need to reinforce tobacco cessation to avoid thrombosis. I also would avoid estrogen therapy for this reason. No spasm seen during cath, but could consider calcium channel blocker for spasm if spells continue.  TIME SPENT WITH PATIENT: >35 minutes of direct patient care. More than 50% of that time was spent on coordination of care and counseling regarding results of testing, plan for follow up and management.  Buford Dresser, MD, PhD [place]  [facility]   [facility] will sign off in anticipation of discharge.   Medication Recommendations:  Atenolol 100 mg daily, HCTZ 12.5 mg daily, lisinopril 10 mg daily, atorvastatin 40 mg daily, aspirin 81 mg daily. I would recommend continued tobacco cessation counseling and cessation of estrogen use as well. Other recommendations (labs, testing, etc):  Will order coronary CTA and BMET 1  week prior Follow up as an outpatient:  2 weeks for TOC, then with Dr. Tamala Julian.  For questions or updates, please contact [place] Please consult [email or web address] for contact info under     Signed, Buford Dresser, MD  12/05/2018, 10:07 AM

## 2018-12-05 NOTE — Discharge Summary (Signed)
Physician Discharge Summary  Stephanie Newton HYQ:[id number] DOB: [birth year] DOA: 12/03/2018  PCP: Stephanie Manes, MD  Admit date: 12/03/2018 Discharge date: 12/06/2018  Admitted From: home Disposition:  home   Recommendations for Outpatient Follow-up:  1. F/u CT for lung nodules   Home Health:  None needed   Discharge Condition:  stable   CODE STATUS:  Full code   Diet recommendation:  Heart healthy Consultations:  cardiology    Discharge Diagnoses:  Principal Problem: Elevated Troponin Active Problems:   Palpitations   Coronary artery aneurysm   Benign essential HTN   HLD (hyperlipidemia)   Lung nodules   Nicotine abuse    HPI:  Stephanie Newton is a 77 y.o. female with HTN, HLD, osteoarthritis s/p L knee replacement, and reported history of palpitations (without diagnosis), who presented with palpitations x 1 hour today while at home. Patient reports that she was cooking in the kitchen around 1pm when she felt the palpitations. She continued cooking because in the past, the palpitations have been transient or self limited without intervention. However, the palpitations -- described as heart "racing" and "pounding" persisted for an hour -- and she began having substernal and left sided chest tightness, radiating into her left arm and upper neck. She arrived to [facility] ED given the persistent symptoms by family transportation; once she arrived to her room, the symptoms (palpitations) resolved. Currently chest pain free.   Denies any known cardiac history. She reports having palpitations for several years that occur about once every 4-6 months, often triggered when she drinks "too much tea" or during the summer/hot weather. Has previously always lasted only for a few minutes and has never caused chest pain in the past until this episode. She has never been diagnosed with any arrhythmia in the past. No known cardiac history (other than HTN/HLD managed by PCP). She denies family  history of arrhythmia.   Hospital Course:     Elevated troponin Troponin I 0.15  0.11  Troponin I    -  cath report below with non-obstructive CAD - no obstructive CAD on cath but coronary aneurysm and coronary fistulas seen - cardiology discussed (DTAP) anticoagulation but patient would like to hold off - cont home aspirin 81 mg daily  Coronary aneurysm - will be followed by cardiology as outpt  HTN/ Palpitations - Atenolol doe has been doubled from 50 to 100 mg - cont Lisinopril and HCTZ - cardiology recommends she come to the ED if she has palpitations again  Tobaccco abuse - has been counseled to stop smoking  HLD - cardiology transitioning from Simvastatin to Atorvastatin  B/L lung nodules in smoker - need close outpt follow up - see CT report below  Discharge Exam: Vitals:   12/05/18 0725 12/05/18 0800  BP: 138/70   Pulse: 65 68  Resp: 16 (!) 24  Temp: 98 F (36.7 C)   SpO2: 98% 98%   Vitals:   12/04/18 2330 12/05/18 0414 12/05/18 0725 12/05/18 0800  BP: 137/74 135/72 138/70   Pulse: 70 71 65 68  Resp: 18 19 16  (!) 24  Temp: 97.9 F (36.6 C) 97.9 F (36.6 C) 98 F (36.7 C)   TempSrc: Oral Oral Oral   SpO2: 99% 96% 98% 98%  Weight:      Height:        General: Pt is alert, awake, not in acute distress Cardiovascular: RRR, S1/S2 +, no rubs, no gallops Respiratory: CTA bilaterally, no wheezing, no rhonchi Abdominal: Soft, NT,  ND, bowel sounds + Extremities: no edema, no cyanosis   Discharge Instructions  Discharge Instructions    Diet - low sodium heart healthy   Complete by:  As directed    Increase activity slowly   Complete by:  As directed      Allergies as of 12/05/2018      Reactions   Shellfish Allergy Nausea Only      Medication List    STOP taking these medications   simvastatin 20 MG tablet Commonly known as:  ZOCOR     TAKE these medications   aspirin 81 MG EC tablet Take 1 tablet (81 mg total) by mouth daily.     atenolol 100 MG tablet Commonly known as:  TENORMIN Take 1 tablet (100 mg total) by mouth daily. What changed:  how much to take   atorvastatin 40 MG tablet Commonly known as:  LIPITOR Take 1 tablet (40 mg total) by mouth daily at 6 PM.   estradiol 1 MG tablet Commonly known as:  ESTRACE Take 1 mg by mouth daily.   hydrochlorothiazide 12.5 MG capsule Commonly known as:  MICROZIDE Take 12.5 mg by mouth daily.   lisinopril 10 MG tablet Commonly known as:  PRINIVIL,ZESTRIL Take 10 mg by mouth daily.       Allergies  Allergen Reactions  . Shellfish Allergy Nausea Only     Procedures/Studies: Echo 12/04/18 - Left ventricle: The cavity size was normal. Wall thickness was increased in a pattern of mild LVH. Systolic function was vigorous. The estimated ejection fraction was in the range of 65% to 70%. Wall motion was normal; there were no regional wall motion abnormalities. Doppler parameters are consistent with abnormal left ventricular relaxation (grade 1 diastolic dysfunction).  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 This would be classified as a giant aneurysm.  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 Left coronary injection leads to shunting of sufficient volume to opacify the left ventricle.  Normal left main.  Normal LAD.  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  Normal left ventricular systolic function with LVEF 70%.  Further troponin likely related to supply demand mismatch in the setting of above anatomy and tachycardia related to arrhythmia.  Ct Angio Chest Pe W And/or Wo Contrast  Result Date: 12/03/2018 CLINICAL DATA:  Palpitations. EXAM: CT ANGIOGRAPHY CHEST WITH CONTRAST TECHNIQUE: Multidetector CT imaging of the chest was performed using the standard protocol during bolus administration of intravenous contrast.  Multiplanar CT image reconstructions and MIPs were obtained to evaluate the vascular anatomy. CONTRAST:  167mL ISOVUE-370 IOPAMIDOL (ISOVUE-370) INJECTION 76% COMPARISON:  Chest x-ray dated 12/03/2018 FINDINGS: Cardiovascular: Satisfactory opacification of the pulmonary arteries to the segmental level. No evidence of pulmonary embolism. Normal heart size. No pericardial effusion. Mediastinum/Nodes: No enlarged mediastinal, hilar, or axillary lymph nodes. Thyroid gland, trachea, and esophagus demonstrate no significant findings. Lungs/Pleura: There is an ill-defined 9 mm nodule in the superior segment of the left lower lobe on image 38 and an 8 mm slightly lobulated nodule in the left lower lobe posterior medially on image number 93. There is a 5 mm ill-defined nodule in the right upper lobe on image 37. All of these are visible on series 6 and series 7. No infiltrates or effusions. Upper Abdomen: 3.4 cm low-density lesion in the upper pole of the right kidney, most likely a cyst. 12 mm low-density lesion in the anterior aspect of the left lobe  of the liver, probably a cyst. Diverticulosis of the splenic flexure of the colon. Cholecystectomy. Musculoskeletal: No chest wall abnormality. No acute or significant osseous findings. Review of the MIP images confirms the above findings. IMPRESSION: 1. No pulmonary emboli or other acute abnormalities. 2. Multiple bilateral pulmonary nodules. Non-contrast chest CT at 3 months is recommended. If the nodules are stable at time of repeat CT, then future CT at 18-24 months (from today's scan) is considered optional for low-risk patients, but is recommended for high-risk patients. This recommendation follows the consensus statement: Guidelines for Management of Incidental Pulmonary Nodules Detected on CT Images: From the Fleischner Society 2017; Radiology 2017; 284:228-243. Electronically Signed   By: Lorriane Shire M.D.   On: 12/03/2018 18:37   Dg Chest Port 1 View  Result  Date: 12/03/2018 CLINICAL DATA:  Heart palpitations today EXAM: PORTABLE CHEST 1 VIEW COMPARISON:  03/16/2014 FINDINGS: The heart size and mediastinal contours are within normal limits. Both lungs are clear. The visualized skeletal structures are unremarkable. IMPRESSION: No active disease. Electronically Signed   By: Ashley Royalty M.D.   On: 12/03/2018 15:11     The results of significant diagnostics from this hospitalization (including imaging, microbiology, ancillary and laboratory) are listed below for reference.     Microbiology: Recent Results (from the past 240 hour(s))  MRSA PCR Screening     Status: None   Collection Time: 12/04/18 12:12 AM  Result Value Ref Range Status   MRSA by PCR NEGATIVE NEGATIVE Final    Comment:        The GeneXpert MRSA Assay (FDA approved for NASAL specimens only), is one component of a comprehensive MRSA colonization surveillance program. It is not intended to diagnose MRSA infection nor to guide or monitor treatment for MRSA infections. Performed at [facility], [place] [address], [place], [place] [address]      Labs: BNP (last 3 results) Recent Labs    12/03/18 2355  BNP 07.6   Basic Metabolic Panel: Recent Labs  Lab 12/03/18 1447 12/04/18 0522 12/05/18 0250  NA 140 140 141  K 4.3 3.7 3.6  CL 103 108 109  CO2 22 22 22   GLUCOSE 136* 94 98  BUN 11 9 9   CREATININE 1.05* 0.84 0.77  CALCIUM 10.0 8.9 8.9  MG 1.9 1.9 2.0   Liver Function Tests: No results for input(s): AST, ALT, ALKPHOS, BILITOT, PROT, ALBUMIN in the last 168 hours. No results for input(s): LIPASE, AMYLASE in the last 168 hours. No results for input(s): AMMONIA in the last 168 hours. CBC: Recent Labs  Lab 12/03/18 1447 12/04/18 0522 12/05/18 0250  WBC 6.2 5.5 4.0  HGB 16.1* 14.3 13.4  HCT 48.2* 41.7 39.8  MCV 90.9 88.0 89.8  PLT 229 200 187   Cardiac Enzymes: Recent Labs  Lab 12/03/18 2355 12/04/18 0522  TROPONINI 0.15* 0.11*   BNP: Invalid  input(s): POCBNP CBG: No results for input(s): GLUCAP in the last 168 hours. D-Dimer No results for input(s): DDIMER in the last 72 hours. Hgb A1c No results for input(s): HGBA1C in the last 72 hours. Lipid Profile No results for input(s): CHOL, HDL, LDLCALC, TRIG, CHOLHDL, LDLDIRECT in the last 72 hours. Thyroid function studies No results for input(s): TSH, T4TOTAL, T3FREE, THYROIDAB in the last 72 hours.  Invalid input(s): FREET3 Anemia work up No results for input(s): VITAMINB12, FOLATE, FERRITIN, TIBC, IRON, RETICCTPCT in the last 72 hours. Urinalysis    Component Value Date/Time   COLORURINE YELLOW 03/22/2018  [place] 03/22/2018 1400   LABSPEC 1.016 03/22/2018 1400   PHURINE 5.0 03/22/2018 1400   GLUCOSEU NEGATIVE 03/22/2018 1400   HGBUR SMALL (A) 03/22/2018 1400   BILIRUBINUR NEGATIVE 03/22/2018 1400   KETONESUR 5 (A) 03/22/2018 1400   PROTEINUR NEGATIVE 03/22/2018 1400   NITRITE NEGATIVE 03/22/2018 1400   LEUKOCYTESUR TRACE (A) 03/22/2018 1400   Sepsis Labs Invalid input(s): PROCALCITONIN,  WBC,  LACTICIDVEN Microbiology Recent Results (from the past 240 hour(s))  MRSA PCR Screening     Status: None   Collection Time: 12/04/18 12:12 AM  Result Value Ref Range Status   MRSA by PCR NEGATIVE NEGATIVE Final    Comment:        The GeneXpert MRSA Assay (FDA approved for NASAL specimens only), is one component of a comprehensive MRSA colonization surveillance program. It is not intended to diagnose MRSA infection nor to guide or monitor treatment for MRSA infections. Performed at [facility], [place] [address], [place], [place] [address]      Time coordinating discharge in minutes: 68  SIGNED:   Debbe Odea, MD  [facility] 12/06/2018, 4:32 PM Pager   If 7PM-7AM, please contact night-coverage [email or web address] Password [email or web address]

## 2018-12-12 ENCOUNTER — Other Ambulatory Visit: Payer: Medicare Other | Admitting: *Deleted

## 2018-12-12 DIAGNOSIS — N183 Chronic kidney disease, stage 3 unspecified: Secondary | ICD-10-CM

## 2018-12-12 LAB — BASIC METABOLIC PANEL
BUN/Creatinine Ratio: 12 (ref 12–28)
BUN: 10 mg/dL (ref 8–27)
CHLORIDE: 102 mmol/L (ref 96–106)
CO2: 21 mmol/L (ref 20–29)
Calcium: 9.9 mg/dL (ref 8.7–10.3)
Creatinine, Ser: 0.82 mg/dL (ref 0.57–1.00)
GFR calc Af Amer: 81 mL/min/{1.73_m2} (ref >59)
GFR calc non Af Amer: 70 mL/min/{1.73_m2} (ref >59)
Glucose: 104 mg/dL — ABNORMAL HIGH (ref 65–99)
Potassium: 3.8 mmol/L (ref 3.5–5.2)
Sodium: 138 mmol/L (ref 134–144)

## 2018-12-14 NOTE — Progress Notes (Signed)
Cardiology Office Note   Date:  12/15/2018   ID:  Stephanie Newton, DOB [birth year], MRN [id number]  PCP:  Lajean Manes, MD  Cardiologist:  Dr. Tamala Julian     Chief Complaint  Patient presents with  . Hospitalization Follow-up      History of Present Illness: Stephanie Newton is a 78 y.o. female who presents for post hopsitlaization 12/03/18   past medical history significant for hypertension, tobacco use, hyperlipidemia, and osteoarthritis who presents to the hospital with complaints of palpitations and chest tightness  The patient states that she has had infrequent palpitations for the past few years.  She has never had a formal cardiac work-up for this. Today in the afternoon she developed palpitations that lasted for approximately 30 to 40 minutes.  She had some associated chest tightness as well.  The pain radiated to her neck and her left arm.  As the pain resolved she also had some tingling in that extremity.  She denies any nausea or vomiting.  However she was diaphoretic.  She does not endorse any orthopnea, paroxysmal nocturnal dyspnea or lower extremity edema. 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  This would be classified as a giant aneurysm.  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 Left coronary injection leads to shunting of sufficient volume to opacify the left ventricle   Aggressive blood pressure control which should include beta-blocker therapy.  Therapy of hyperlipidemia if present.  Treatment of aneurysm will be considered.  Management will likely be surgical as stenting and coiling in this proximal segment with a large neck would present significant technical problems.  Needs coronary CT angios performed to accurately size the aneurysm and can then be used to follow over time for evidence of growth.  Cardiac CTA is ordered.     CT of  chest with no PE but multiple bilateral pulmonary nodules. Will need follow up CTA  She is a smoker.    There was recommendation for anticoagulation but she wanted to avoid.  To discuss.  BB at 100 mg.   Statin was changed from simvastatin to atorvastatin.    Per Dr. Harrell Gave "If she has a recurrent event as severe as the one this time, I instructed her to come to ER. If her markers are up, I would encourage anticoagulation. Also need to reinforce tobacco cessation to avoid thrombosis. I also would avoid estrogen therapy for this reason. No spasm seen during cath, but could consider calcium channel blocker for spasm if spells continue"  She has done well since discharge.  No chest pain or SOB. Feels well.  She prefers no anticoagulation unless Dr. Tamala Julian recommends.  We did discuss her pulmonary nodules.  With upcoming cardiac CTA will see if changes from prior.  Would send to pulmonary -lung nodule clinic for follow up.   She has stopped smoking, congratulated.   Past Medical History:  Diagnosis Date  . Hypercholesteremia   . Hypertension     Past Surgical History:  Procedure Laterality Date  . BREAST BIOPSY     bilateral   . EYE SURGERY     cataract bilateral   . HERNIA REPAIR  06/2000   INCISIONAL VENTRAL ; DR Nedra Hai   . KNEE ARTHROSCOPY Left 08/23/2017   dr Theda Sers   . LAPAROSCOPIC CHOLECYSTECTOMY  06/13/2000   DR Nedra Hai   . LEFT HEART CATH AND CORONARY ANGIOGRAPHY  N/A 12/04/2018   Procedure: LEFT HEART CATH AND CORONARY ANGIOGRAPHY;  Surgeon: Belva Crome, MD;  Location: [facility];  Service: Cardiovascular;  Laterality: N/A;  . TOTAL KNEE ARTHROPLASTY Left 03/31/2018   Procedure: LEFT TOTAL KNEE ARTHROPLASTY;  Surgeon: Sydnee Cabal, MD;  Location: [facility];  Service: Orthopedics;  Laterality: Left;  with block  . VAGINAL HYSTERECTOMY  age 47s     Current Outpatient Medications  Medication Sig Dispense Refill  . aspirin EC 81 MG EC tablet Take 1  tablet (81 mg total) by mouth daily. 30 tablet 0  . atenolol (TENORMIN) 100 MG tablet Take 1 tablet (100 mg total) by mouth daily. 30 tablet 0  . atorvastatin (LIPITOR) 40 MG tablet Take 1 tablet (40 mg total) by mouth daily at 6 PM. 30 tablet 0  . estradiol (ESTRACE) 1 MG tablet Take 1 mg by mouth daily.    . hydrochlorothiazide (MICROZIDE) 12.5 MG capsule Take 12.5 mg by mouth daily.    Marland Kitchen lisinopril (PRINIVIL,ZESTRIL) 10 MG tablet Take 10 mg by mouth daily.     No current facility-administered medications for this visit.     Allergies:   Shellfish allergy    Social History:  The patient  reports that she has been smoking cigarettes. She has smoked for the past 30.00 years. She has never used smokeless tobacco. She reports current alcohol use. She reports that she does not use drugs.   Family History:  The patient's family history is not on file. no FH of arrhythmias   ROS:  General:no colds or fevers, no weight changes Skin:no rashes or ulcers HEENT:no blurred vision, no congestion CV:see HPI PUL:see HPI GI:no diarrhea constipation or melena, no indigestion GU:no hematuria, no dysuria MS:no joint pain, no claudication Neuro:no syncope, no lightheadedness Endo:no diabetes, no thyroid disease  Wt Readings from Last 3 Encounters:  12/15/18 156 lb (70.8 kg)  12/03/18 149 lb 0.5 oz (67.6 kg)  03/31/18 149 lb 0.5 oz (67.6 kg)     PHYSICAL EXAM: VS:  BP (!) 142/78   Pulse 62   Ht 5\' 5"  (1.651 m)   Wt 156 lb (70.8 kg)   SpO2 98%   BMI 25.96 kg/m  , BMI Body mass index is 25.96 kg/m. General:Pleasant affect, NAD Skin:Warm and dry, brisk capillary refill HEENT:normocephalic, sclera clear, mucus membranes moist Neck:supple, no JVD, no bruits  Heart:S1S2 RRR without murmur, gallup, rub or click Lungs:clear without rales, rhonchi, or wheezes OVF:IEPP, non tender, + BS, do not palpate liver spleen or masses Ext:no lower ext edema, 2+ pedal pulses, 2+ radial pulses Neuro:alert  and oriented, MAE, follows commands, + facial symmetry    EKG:  EKG is NOT ordered today.    Recent Labs: 12/03/2018: B Natriuretic Peptide 68.2; TSH 1.404 12/05/2018: Hemoglobin 13.4; Magnesium 2.0; Platelets 187 12/12/2018: BUN 10; Creatinine, Ser 0.82; Potassium 3.8; Sodium 138    Lipid Panel    Component Value Date/Time   CHOL 193 12/03/2018 1447   TRIG 211 (H) 12/03/2018 1447   HDL 77 12/03/2018 1447   CHOLHDL 2.5 12/03/2018 1447   VLDL 42 (H) 12/03/2018 1447   LDLCALC 74 12/03/2018 1447       Other studies Reviewed: Additional studies/ records that were reviewed today include: . Echo 12/04/18 - Left ventricle: The cavity size was normal. Wall thickness was increased in a pattern of mild LVH. Systolic function was vigorous. The estimated ejection fraction was in the range of 65% to 70%. Wall  motion was normal; there were no regional wall motion abnormalities. Doppler parameters are consistent with abnormal left ventricular relaxation (grade 1 diastolic dysfunction).  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 This would be classified as a giant aneurysm.  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 Left coronary injection leads to shunting of sufficient volume to opacify the left ventricle.  Normal left main.  Normal LAD.  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  Normal left ventricular systolic function with LVEF 70%.  Further troponin likely related to supply demand mismatch in the setting of above anatomy and tachycardia related to arrhythmia.  RECOMMENDATIONS:   Aggressive blood pressure control which should include beta-blocker therapy.  Therapy of hyperlipidemia if present.  Treatment of aneurysm will be considered. Management will likely be surgical as stenting and coiling in this proximal segment with a large neck  would present significant technical problems.  Needs coronary CT angios performed to accurately size the aneurysm and can then be used to follow over time for evidence of growth.  ASSESSMENT AND PLAN:  1.  Ramus intermedius saccular aneurysm -giant aneurysm. 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 Left coronary injection leads to shunting of sufficient volume to opacify the left ventricle--may need surgery.  For coroansyr CTA.  troponin likely related to supply demand mismatch in the setting of above anatomy and tachycardia related to arrhythmia  She will follow up with Dr. Tamala Julian post Cardiac CTa.  On ASA will discuss with Dr. Tamala Julian anticoagulation.   2.  HLD stable on statin  3.  Tobacco use, has stopped  4.  Lung nodules will defer to Dr. Tamala Julian aft Cardiac CTA.   5.  HTN controlled.   Current medicines are reviewed with the patient today.  The patient Has no concerns regarding medicines.  The following changes have been made:  See above Labs/ tests ordered today include:see above  Disposition:   FU:  see above  Signed, Cecilie Kicks, NP  12/15/2018 11:23 AM    [address], [place], [place], [place] Phone: [phone]; Fax: [phone]

## 2018-12-15 ENCOUNTER — Encounter: Payer: Self-pay | Admitting: Cardiology

## 2018-12-15 ENCOUNTER — Ambulatory Visit (INDEPENDENT_AMBULATORY_CARE_PROVIDER_SITE_OTHER): Payer: Medicare Other | Admitting: Cardiology

## 2018-12-15 VITALS — BP 142/78 | HR 62 | Ht 65.0 in | Wt 156.0 lb

## 2018-12-15 DIAGNOSIS — I2541 Coronary artery aneurysm: Secondary | ICD-10-CM

## 2018-12-15 DIAGNOSIS — R918 Other nonspecific abnormal finding of lung field: Secondary | ICD-10-CM | POA: Diagnosis not present

## 2018-12-15 DIAGNOSIS — I10 Essential (primary) hypertension: Secondary | ICD-10-CM

## 2018-12-15 DIAGNOSIS — E782 Mixed hyperlipidemia: Secondary | ICD-10-CM | POA: Diagnosis not present

## 2018-12-15 DIAGNOSIS — Z720 Tobacco use: Secondary | ICD-10-CM | POA: Diagnosis not present

## 2018-12-15 NOTE — Patient Instructions (Signed)
Your physician recommends that you continue on your current medications as directed. Please refer to the Current Medication list given to you today.   Your physician recommends that you schedule a follow-up appointment in: AS PLANNED  

## 2018-12-18 ENCOUNTER — Telehealth: Payer: Self-pay | Admitting: *Deleted

## 2018-12-18 DIAGNOSIS — I10 Essential (primary) hypertension: Secondary | ICD-10-CM | POA: Diagnosis not present

## 2018-12-18 DIAGNOSIS — I2541 Coronary artery aneurysm: Secondary | ICD-10-CM | POA: Diagnosis not present

## 2018-12-18 MED ORDER — CLOPIDOGREL BISULFATE 75 MG PO TABS: 75.0000 mg | ORAL_TABLET | Freq: Every day | ORAL | 3 refills | Status: DC

## 2018-12-18 NOTE — Telephone Encounter (Signed)
Called pt re: message below re: starting Plavix. Pt agreed and rx has been sent to [facility], per pt request.

## 2018-12-18 NOTE — Telephone Encounter (Signed)
-----   Message from Isaiah Serge, NP sent at 12/18/2018  7:56 AM EST ----- Please let pt know I discussed anticoagulation with Dr. Tamala Julian and he feels the data is not solid.  He would like her on plavix 75 mg daily and asa 81 mg daily, the plavix is new.  Thanks.  Mickel Baas  ----- Message ----- From: Belva Crome, MD Sent: 12/18/2018   7:30 AM EST To: Isaiah Serge, NP  No solid data on anticoagulation. I'd say ASA and Plavix x 1 year then drop aspirin. ----- Message ----- From: Isaiah Serge, NP Sent: 12/16/2018  10:57 PM EST To: Belva Crome, MD  Dr. Tamala Julian, Stephanie Newton was stable.  For cardiac CTA then follow up with you.  She and Dr. Harrell Gave discussed anticoagulant - pt prefers not but if you want her on one she will take.  Just let me know thanks.Marland Kitchen

## 2018-12-22 ENCOUNTER — Telehealth (HOSPITAL_COMMUNITY): Payer: Self-pay | Admitting: Emergency Medicine

## 2018-12-22 NOTE — Telephone Encounter (Signed)
Reaching out to patient to offer assistance regarding upcoming cardiac imaging study; pt verbalizes understanding of appt date/time, parking situation and where to check in, pre-test NPO status and medications ordered, and verified current allergies; name and call back number provided for further questions should they arise Randie Bloodgood RN Navigator Cardiac Imaging [facility] [phone] office [phone] cell 

## 2018-12-26 ENCOUNTER — Ambulatory Visit (HOSPITAL_COMMUNITY): Admission: RE | Admit: 2018-12-26 | Payer: Medicare Other | Source: Ambulatory Visit

## 2018-12-27 ENCOUNTER — Telehealth (HOSPITAL_COMMUNITY): Payer: Self-pay | Admitting: Emergency Medicine

## 2018-12-27 NOTE — Telephone Encounter (Signed)
Calling to infom patient of new appointment for coronary CTA on 01/05/19 at 12:30; pt verbalizes understanding   Marchia Bond RN Navigator Cardiac Imaging [facility] [phone] Office  [phone] Cell

## 2019-01-03 ENCOUNTER — Telehealth (HOSPITAL_COMMUNITY): Payer: Self-pay | Admitting: Emergency Medicine

## 2019-01-03 NOTE — Telephone Encounter (Signed)
Reaching out to patient to offer assistance regarding upcoming cardiac imaging study; pt verbalizes understanding of appt date/time, parking situation and where to check in, pre-test NPO status and medications ordered, and verified current allergies; name and call back number provided for further questions should they arise Marchia Bond RN Navigator Cardiac Imaging [facility] [phone] office [phone] cell  Confirmed with patient to take atenolol AM of test and skip AM dose of HCTZ; pt verbalized understanding

## 2019-01-05 ENCOUNTER — Ambulatory Visit (HOSPITAL_COMMUNITY)
Admission: RE | Admit: 2019-01-05 | Discharge: 2019-01-05 | Disposition: A | Discharge status: Home / Self Care | Payer: Medicare Other | Source: Ambulatory Visit | Attending: Cardiology | Admitting: Cardiology

## 2019-01-05 ENCOUNTER — Encounter (HOSPITAL_COMMUNITY): Payer: Self-pay

## 2019-01-05 DIAGNOSIS — I2510 Atherosclerotic heart disease of native coronary artery without angina pectoris: Secondary | ICD-10-CM

## 2019-01-05 DIAGNOSIS — I2541 Coronary artery aneurysm: Secondary | ICD-10-CM

## 2019-01-05 MED ORDER — NITROGLYCERIN 0.4 MG SL SUBL
0.8000 mg | SUBLINGUAL_TABLET | Freq: Once | SUBLINGUAL | Status: AC
  Administered 2019-01-05: 0.8 mg via SUBLINGUAL
  Filled 2019-01-05: qty 25

## 2019-01-05 MED ORDER — IOPAMIDOL (ISOVUE-370) INJECTION 76%
100.0000 mL | Freq: Once | INTRAVENOUS | Status: AC | PRN
  Administered 2019-01-05: 100 mL via INTRAVENOUS

## 2019-01-05 MED ORDER — NITROGLYCERIN 0.4 MG SL SUBL
SUBLINGUAL_TABLET | SUBLINGUAL | Status: AC
  Administered 2019-01-05: 0.8 mg via SUBLINGUAL
  Filled 2019-01-05: qty 2

## 2019-01-05 NOTE — Discharge Instructions (Signed)
What You Need to Know About IV Contrast Material °IV contrast material is most often a fluid that is used with some imaging tests. Contrast material is injected into your body through a vein to help your health care providers see your organs and tissues more clearly. It may be used with: °· X-ray. °· MRI. °· CT. °· Ultrasound. °Contrast material is used when your health care providers need a detailed look at organs, tissues, or blood vessels that may not show up with the standard test. IV contrast may be used for imaging tests that examine: °· Muscles, skin, and fat. °· Breasts. °· Brain. °· Digestive tract. °· Heart. °· Liver. °· Lungs and many other internal organs. °What are the risks of using IV contrast material? °The risks of using IV contrast material include: °· Headache. °· Itching, skin rash, and hives. °· Allergic reactions. °· Nausea and vomiting. °· Wheezing or difficulty breathing. °· Abnormal heart rate. °· Blood pressure changes. °· Throat swelling. °· Kidney damage. °These complications are more likely to occur in people who: °· Have kidney failure. °· Have liver problems. °· Have certain heart problems, including: °? Heart failure. °? Heart attack. °? Heart infection. °? Heart valve problems. °· Abuse alcohol. °· Have allergies or asthma. °· Are dehydrated. °· Have sickle cell anemia or similar problems. °· Have had trouble with IV contrast material in the past. °· Take certain medicines, such as: °? Metformin. °? NSAIDs. °? Beta blockers. °? Interleukin-2. °How do I prepare for my test with IV contrast material? °· Follow instructions from your health care provider about eating or drinking restrictions. °· Ask your health care provider about changing or stopping your regular medicines. This is especially important if you are taking diabetes medicines or blood thinners. °· Tell your health care provider about: °? Any previous illnesses, surgeries, or pre-existing medical conditions. °? Whether you  are pregnant or may be pregnant. °? Whether you are breastfeeding. Most contrast agents are safe for use in breastfeeding women. °· You may have a physical exam to determine any potential risks. °· Ask if you will be given a medicine (sedative) to help you relax during the procedure. If so, plan to have someone take you home after test. °What happens during the test with IV contrast material? ° °· You may be given a sedative to help you relax. °· A needle will be inserted into one of your veins to administer the IV contrast material. °· You may feel warmth or flushing as the material enters your bloodstream. °· You may have a metallic taste in your mouth for a few minutes. °· The needle may cause some discomfort and bruising. °· After the contrast material is in your body, the imaging test will be done. °The procedure may vary among health care providers and hospitals. °What happens after the test with IV contrast material? °· You may be asked to drink water or other fluids to wash (flush) the contrast material out of your body. °· Drink enough fluid to keep your urine pale yellow. °· Do not drive for 24 hours if you received a sedative. °· It is your responsibility to get your test results. Ask your health care provider or the department performing the test when your results will be ready. °Contact a health care provider if: °· You have redness, swelling, or pain near your IV site. °Get help right away if: °· You have an abnormal heart rhythm. °· You have trouble breathing. °· You have: °?   Chest pain. °? Pain in your back, neck, arm, jaw, or stomach. °? Nausea or sweating. °? Hives or a rash. °· You start shaking and cannot stop. °These symptoms may represent a serious problem that is an emergency. Do not wait to see if the symptoms will go away. Get medical help right away. Call your local emergency services (911 in the [place] Do not drive yourself to the hospital. °Summary °· IV contrast may be used for imaging  tests to help your health care providers see your organs and tissues more clearly. °· Tell your health care provider if you are pregnant or may be pregnant. °· During the procedure, you may feel warmth or flushing as the material enters your bloodstream. °· After the procedure, drink enough fluid to keep your urine pale yellow. °This information is not intended to replace advice given to you by your health care provider. Make sure you discuss any questions you have with your health care provider. °Document Released: 11/10/2009 Document Revised: 07/17/2018 Document Reviewed: 07/30/2015 °Elsevier Interactive Patient Education © 2019 [employer] ° °

## 2019-01-05 NOTE — Progress Notes (Signed)
Pt denies CP or SOB.  Pt stated allergy to shellfish, nausea only.  No reaction to IV contrast in the past, per Pt.

## 2019-01-05 NOTE — Progress Notes (Signed)
Pt tolerated exam without incident.  VSS.  Post IV contrast administration instructions given to patient.  Pt discharged, ambulated without any problems.

## 2019-01-10 ENCOUNTER — Ambulatory Visit (HOSPITAL_COMMUNITY)
Admission: RE | Admit: 2019-01-10 | Discharge: 2019-01-10 | Disposition: A | Discharge status: Home / Self Care | Payer: Medicare Other | Source: Ambulatory Visit | Attending: Cardiovascular Disease | Admitting: Cardiovascular Disease

## 2019-01-10 ENCOUNTER — Telehealth: Payer: Self-pay | Admitting: Nurse Practitioner

## 2019-01-10 DIAGNOSIS — I2510 Atherosclerotic heart disease of native coronary artery without angina pectoris: Secondary | ICD-10-CM | POA: Insufficient documentation

## 2019-01-10 NOTE — Telephone Encounter (Signed)
New Message         Patient returned call, not sure who she was suppose to speak to, didn't see notes.

## 2019-01-10 NOTE — Telephone Encounter (Signed)
Spoke with pt and went over results.  Pt verbalized understanding and was appreciative for call.

## 2019-01-17 NOTE — Progress Notes (Signed)
Cardiology Office Note:    Date:  01/18/2019   ID:  NICOLEMARIE Newton, DOB [birth year], MRN [id number]  PCP:  Lajean Manes, MD  Cardiologist:  Sinclair Grooms, MD   Referring MD: Lajean Manes, MD   Chief Complaint  Patient presents with  . Irregular Heart Beat  . Advice Only    Coronary aneurysm  . Hypertension    History of Present Illness:    Stephanie Newton is a 78 y.o. female with a hx of hyperlipidemia, hypertension, tobacco use, NSTEMI, and LM saccular aneurysm.  Stephanie Newton was recently hospitalized with a non-ST elevation cardiac event that occurred in the setting of rapid palpitations that persisted for greater than 25 minutes.  By the time she arrived in the hospital the tachycardia had resolved.  She felt fine and was near being discharged when cardiac markers came back slightly elevated.  This led to coronary angiography.  Angiography demonstrated a saccular aneurysm on the distal left main.  She has no history of childhood illness or Kawasaki's disease.  Never had a prior cardiac event.  She was a smoker until the recent hospitalization.  She has longstanding hypertension and is compliant with therapy.  She has hyper lipidemia and is on simvastatin.  She is on dual antiplatelet therapy because of the recent cardiac event.  Troponin elevation was 0.1 5.11.  There were no acute EKG changes.  Past Medical History:  Diagnosis Date  . Hypercholesteremia   . Hypertension     Past Surgical History:  Procedure Laterality Date  . BREAST BIOPSY     bilateral   . EYE SURGERY     cataract bilateral   . HERNIA REPAIR  06/2000   INCISIONAL VENTRAL ; DR Nedra Hai   . KNEE ARTHROSCOPY Left 08/23/2017   dr Theda Sers   . LAPAROSCOPIC CHOLECYSTECTOMY  06/13/2000   DR Nedra Hai   . LEFT HEART CATH AND CORONARY ANGIOGRAPHY N/A 12/04/2018   Procedure: LEFT HEART CATH AND CORONARY ANGIOGRAPHY;  Surgeon: Belva Crome, MD;  Location: [facility];  Service:  Cardiovascular;  Laterality: N/A;  . TOTAL KNEE ARTHROPLASTY Left 03/31/2018   Procedure: LEFT TOTAL KNEE ARTHROPLASTY;  Surgeon: Sydnee Cabal, MD;  Location: [facility];  Service: Orthopedics;  Laterality: Left;  with block  . VAGINAL HYSTERECTOMY  age 47s    Current Medications: Current Meds  Medication Sig  . aspirin EC 81 MG EC tablet Take 1 tablet (81 mg total) by mouth daily.  Marland Kitchen atenolol (TENORMIN) 100 MG tablet Take 1 tablet (100 mg total) by mouth daily.  . clopidogrel (PLAVIX) 75 MG tablet Take 1 tablet (75 mg total) by mouth daily.  Marland Kitchen estradiol (ESTRACE) 1 MG tablet Take 1 mg by mouth daily.  . hydrochlorothiazide (MICROZIDE) 12.5 MG capsule Take 12.5 mg by mouth daily.  . meloxicam (MOBIC) 15 MG tablet Take 15 mg by mouth daily as needed.  . simvastatin (ZOCOR) 20 MG tablet Take 20 mg by mouth daily.  . [DISCONTINUED] lisinopril (PRINIVIL,ZESTRIL) 10 MG tablet Take 10 mg by mouth daily.     Allergies:   Shellfish allergy   Social History   Socioeconomic History  . Marital status: Married    Spouse name: Not on file  . Number of children: Not on file  . Years of education: Not on file  . Highest education level: Not on file  Occupational History  . Not on file  Social Needs  . Financial resource strain: Patient  refused  . Food insecurity:    Worry: Patient refused    Inability: Patient refused  . Transportation needs:    Medical: Patient refused    Non-medical: Patient refused  Tobacco Use  . Smoking status: Current Some Day Smoker    Years: 30.00    Types: Cigarettes  . Smokeless tobacco: Never Used  . Tobacco comment: 6 cigarettes a day  Substance and Sexual Activity  . Alcohol use: Yes    Comment: occasionally   . Drug use: No  . Sexual activity: Yes  Lifestyle  . Physical activity:    Days per week: Patient refused    Minutes per session: Patient refused  . Stress: Patient refused  Relationships  . Social connections:    Talks on phone: Patient  refused    Gets together: Patient refused    Attends religious service: Patient refused    Active member of club or organization: Patient refused    Attends meetings of clubs or organizations: Patient refused    Relationship status: Patient refused  Other Topics Concern  . Not on file  Social History Narrative  . Not on file     Family History: The patient's family history is not on file.  ROS:   Please see the history of present illness.    Faint/few episodes of brief tachycardia.  No prolonged episodes.  All other systems reviewed and are negative.  EKGs/Labs/Other Studies Reviewed:    The following studies were reviewed today:  COR ANGIOGRAM by CT with FFR 01/05/2019  IMPRESSION: 1. Coronary artery calcium score 0 Agatston units. This suggests low risk for future cardiac events.  2. Saccular aneurysm (measuring 8 x 8 x 10 mm arising from the left main at the trifurcation of the LAD/LCx/ramus as described above.  3. 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  4.  No significant coronary disease.  IMPRESSION: No evidence for hemodynamic significance by compression of ramus, LAD, or LCx by the aneurysm off the distal left main.    Stephanie Newton   EKG:  EKG not repeated.  ECG performed 12/04/2018 showed normal sinus rhythm with small inferior Q waves and lead III and aVF.  That appearance is unchanged when compared to December 2017.  Recent Labs: 12/03/2018: B Natriuretic Peptide 68.2; TSH 1.404 12/05/2018: Hemoglobin 13.4; Magnesium 2.0; Platelets 187 12/12/2018: BUN 10; Creatinine, Ser 0.82; Potassium 3.8; Sodium 138  Recent Lipid Panel    Component Value Date/Time   CHOL 193 12/03/2018 1447   TRIG 211 (H) 12/03/2018 1447   HDL 77 12/03/2018 1447   CHOLHDL 2.5 12/03/2018 1447   VLDL 42 (H) 12/03/2018 1447   LDLCALC 74 12/03/2018 1447    Physical Exam:    VS:  BP 138/88   Pulse 61   Ht 5\' 5"  (1.651 m)   Wt 155 lb (70.3  kg)   SpO2 98%   BMI 25.79 kg/m     Wt Readings from Last 3 Encounters:  01/18/19 155 lb (70.3 kg)  12/15/18 156 lb (70.8 kg)  12/03/18 149 lb 0.5 oz (67.6 kg)     GEN: Consistent with age.. No acute distress HEENT: Normal NECK: No JVD. LYMPHATICS: No lymphadenopathy CARDIAC: RRR.  No murmur, no gallop, no edema VASCULAR: 2+ bilateral pulses, no bruits RESPIRATORY:  Clear to auscultation without rales, wheezing or rhonchi  ABDOMEN: Soft, non-tender, non-distended, No pulsatile mass, MUSCULOSKELETAL: No deformity  SKIN: Warm and dry NEUROLOGIC:  Alert and  oriented x 3 PSYCHIATRIC:  Normal affect   ASSESSMENT:    1. Coronary artery aneurysm   2. Essential hypertension   3. Mixed hyperlipidemia   4. Tobacco use   5. Palpitations   6. Elevated troponin   7. CKD (chronic kidney disease) stage 3, GFR 30-59 ml/min (HCC)    PLAN:    In order of problems listed above:  1. Distal left main saccular aneurysm measuring 10 mm at maximal diameter.  Plan follow-up CT scan in 12 months.  Continue Tenormin and switch lisinopril to losartan 50 mg/day.  This will slightly improve blood pressure to 130/80 mmHg or less and will give the patient to agents that have been demonstrated to decrease expansion of aortic aneurysm.  Secondary risk prevention such as smoking cessation, LDL less than 70, good sleep pattern, screening for diabetes, etc. were discussed in detail 2. Blood pressure is slightly above the target of 130/80 mmHg.  Increase intensity of angiotensin system blockade by switching to losartan 50 mg/day from lisinopril 10 mg. 3. Continue intermediate dose statin therapy to keep LDL less than 70. 4. Smoking cessation has occurred. 5. Recommended that she purchase a Kardia IQ and the next time she is having a significant period of tachycardia, to record a rhythm strip for our evaluation. 6. This is in the setting of tachycardia and likely represents supply demand mismatch.  Coronaries  are widely patent with the exception of the coronary aneurysm.  FFR did not demonstrate any significant evidence of ischemia in the proximal and mid segments of any of the coronary arteries supplied by the left main. 7. Kidney function not addressed.  Overall education and awareness concerning primary/secondary risk prevention was discussed in detail: LDL less than 70, hemoglobin A1c less than 7, blood pressure target less than 130/80 mmHg, >150 minutes of moderate aerobic activity per week, avoidance of smoking, weight control (via diet and exercise), and continued surveillance/management of/for obstructive sleep apnea.  Plan to discontinue Plavix after 6 months.  Have given metoprolol tartrate 25 mg tablets to be used to break rapid arrhythmia if it occurs and lasts for longer than 2 to 3 minutes.  It would be okay to chew the metoprolol.  Clinical follow-up in 6 months.  Repeat coronary CT to resize distal left main aneurysm in 12 months.    Medication Adjustments/Labs and Tests Ordered: Current medicines are reviewed at length with the patient today.  Concerns regarding medicines are outlined above.  Orders Placed This Encounter  Procedures  . Basic metabolic panel   Meds ordered this encounter  Medications  . metoprolol tartrate (LOPRESSOR) 25 MG tablet    Sig: Take 1 tablet (25 mg total) by mouth daily as needed.    Dispense:  10 tablet    Refill:  0  . losartan (COZAAR) 50 MG tablet    Sig: Take 1 tablet (50 mg total) by mouth daily.    Dispense:  90 tablet    Refill:  3    D/c Lisinopril    Patient Instructions  Medication Instructions:  1) DISCONTINUE Lisinopril 2) START Losartan 50mg  once daily 3) If you have an episode where your heart rate gets high you may take a Metoprolol Tartrate 25mg  tablet  If you need a refill on your cardiac medications before your next appointment, please call your pharmacy.   Lab work: Your physician recommends that you return for lab  work in: 2 weeks (BMET)  If you have labs (blood work) drawn today  and your tests are completely normal, you will receive your results only by: Marland Kitchen MyChart Message (if you have MyChart) OR . A paper copy in the mail If you have any lab test that is abnormal or we need to change your treatment, we will call you to review the results.  Testing/Procedures: None  Follow-Up: At [facility], you and your health needs are our priority.  As part of our continuing mission to provide you with exceptional heart care, we have created designated Provider Care Teams.  These Care Teams include your primary Cardiologist (physician) and Advanced Practice Providers (APPs -  Physician Assistants and Nurse Practitioners) who all work together to provide you with the care you need, when you need it. You will need a follow up appointment in 6 months.  Please call our office 2 months in advance to schedule this appointment.  You may see Sinclair Grooms, MD or one of the following Advanced Practice Providers on your designated Care Team:   Truitt Merle, NP Cecilie Kicks, NP . Kathyrn Drown, NP  Any Other Special Instructions Will Be Listed Below (If Applicable).  Please look into getting a Jodelle Red IQ device that can check an EKG through your phone.       Signed, Sinclair Grooms, MD  01/18/2019 11:37 AM    [place]

## 2019-01-18 ENCOUNTER — Ambulatory Visit (INDEPENDENT_AMBULATORY_CARE_PROVIDER_SITE_OTHER): Payer: Medicare Other | Admitting: Interventional Cardiology

## 2019-01-18 ENCOUNTER — Encounter: Payer: Self-pay | Admitting: Interventional Cardiology

## 2019-01-18 VITALS — BP 138/88 | HR 61 | Ht 65.0 in | Wt 155.0 lb

## 2019-01-18 DIAGNOSIS — R002 Palpitations: Secondary | ICD-10-CM | POA: Diagnosis not present

## 2019-01-18 DIAGNOSIS — I2541 Coronary artery aneurysm: Secondary | ICD-10-CM | POA: Diagnosis not present

## 2019-01-18 DIAGNOSIS — Z720 Tobacco use: Secondary | ICD-10-CM | POA: Diagnosis not present

## 2019-01-18 DIAGNOSIS — I10 Essential (primary) hypertension: Secondary | ICD-10-CM | POA: Diagnosis not present

## 2019-01-18 DIAGNOSIS — N183 Chronic kidney disease, stage 3 unspecified: Secondary | ICD-10-CM

## 2019-01-18 DIAGNOSIS — I2510 Atherosclerotic heart disease of native coronary artery without angina pectoris: Secondary | ICD-10-CM | POA: Diagnosis not present

## 2019-01-18 DIAGNOSIS — R7989 Other specified abnormal findings of blood chemistry: Secondary | ICD-10-CM | POA: Diagnosis not present

## 2019-01-18 DIAGNOSIS — R778 Other specified abnormalities of plasma proteins: Secondary | ICD-10-CM

## 2019-01-18 DIAGNOSIS — E782 Mixed hyperlipidemia: Secondary | ICD-10-CM | POA: Diagnosis not present

## 2019-01-18 MED ORDER — METOPROLOL TARTRATE 25 MG PO TABS: 25.0000 mg | ORAL_TABLET | Freq: Every day | ORAL | 0 refills | Status: DC | PRN

## 2019-01-18 MED ORDER — LOSARTAN POTASSIUM 50 MG PO TABS: 50.0000 mg | ORAL_TABLET | Freq: Every day | ORAL | 3 refills | Status: DC

## 2019-01-18 NOTE — Patient Instructions (Addendum)
Medication Instructions:  1) DISCONTINUE Lisinopril 2) START Losartan 50mg  once daily 3) If you have an episode where your heart rate gets high you may take a Metoprolol Tartrate 25mg  tablet  If you need a refill on your cardiac medications before your next appointment, please call your pharmacy.   Lab work: Your physician recommends that you return for lab work in: 2 weeks (BMET)  If you have labs (blood work) drawn today and your tests are completely normal, you will receive your results only by: Marland Kitchen MyChart Message (if you have MyChart) OR . A paper copy in the mail If you have any lab test that is abnormal or we need to change your treatment, we will call you to review the results.  Testing/Procedures: None  Follow-Up: At [facility], you and your health needs are our priority.  As part of our continuing mission to provide you with exceptional heart care, we have created designated Provider Care Teams.  These Care Teams include your primary Cardiologist (physician) and Advanced Practice Providers (APPs -  Physician Assistants and Nurse Practitioners) who all work together to provide you with the care you need, when you need it. You will need a follow up appointment in 6 months.  Please call our office 2 months in advance to schedule this appointment.  You may see Sinclair Grooms, MD or one of the following Advanced Practice Providers on your designated Care Team:   Truitt Merle, NP Cecilie Kicks, NP . Kathyrn Drown, NP  Any Other Special Instructions Will Be Listed Below (If Applicable).  Please look into getting a Jodelle Red IQ device that can check an EKG through your phone.

## 2019-01-25 ENCOUNTER — Other Ambulatory Visit: Payer: Medicare Other

## 2019-01-29 DIAGNOSIS — M25541 Pain in joints of right hand: Secondary | ICD-10-CM | POA: Diagnosis not present

## 2019-01-29 DIAGNOSIS — I10 Essential (primary) hypertension: Secondary | ICD-10-CM | POA: Diagnosis not present

## 2019-01-29 DIAGNOSIS — I2541 Coronary artery aneurysm: Secondary | ICD-10-CM | POA: Diagnosis not present

## 2019-01-29 DIAGNOSIS — Z79899 Other long term (current) drug therapy: Secondary | ICD-10-CM | POA: Diagnosis not present

## 2019-02-01 ENCOUNTER — Other Ambulatory Visit: Payer: Medicare Other | Admitting: *Deleted

## 2019-02-01 DIAGNOSIS — I10 Essential (primary) hypertension: Secondary | ICD-10-CM

## 2019-02-02 LAB — BASIC METABOLIC PANEL
BUN/Creatinine Ratio: 18 (ref 12–28)
BUN: 14 mg/dL (ref 8–27)
CO2: 24 mmol/L (ref 20–29)
Calcium: 9.8 mg/dL (ref 8.7–10.3)
Chloride: 104 mmol/L (ref 96–106)
Creatinine, Ser: 0.78 mg/dL (ref 0.57–1.00)
GFR calc Af Amer: 86 mL/min/{1.73_m2} (ref >59)
GFR calc non Af Amer: 75 mL/min/{1.73_m2} (ref >59)
Glucose: 91 mg/dL (ref 65–99)
Potassium: 4.2 mmol/L (ref 3.5–5.2)
Sodium: 142 mmol/L (ref 134–144)

## 2019-02-12 DIAGNOSIS — Z471 Aftercare following joint replacement surgery: Secondary | ICD-10-CM | POA: Diagnosis not present

## 2019-02-12 DIAGNOSIS — Z96652 Presence of left artificial knee joint: Secondary | ICD-10-CM | POA: Diagnosis not present

## 2019-02-12 DIAGNOSIS — M25561 Pain in right knee: Secondary | ICD-10-CM | POA: Diagnosis not present

## 2019-04-20 ENCOUNTER — Telehealth: Payer: Self-pay | Admitting: Interventional Cardiology

## 2019-04-20 NOTE — Telephone Encounter (Signed)
New message   Patient needs to e-mail a home ekg to Dr. Tamala Julian. Please call the patient to discuss. Patient states that she needs his e-mail address.

## 2019-04-20 NOTE — Telephone Encounter (Signed)
Spoke with pt and made her aware that we are unable to provide the physician's emails.  Advised she can send through [facility].  Pt states she we will have her son help her send that over.

## 2019-04-23 ENCOUNTER — Telehealth: Payer: Self-pay | Admitting: Interventional Cardiology

## 2019-04-23 MED ORDER — METOPROLOL SUCCINATE ER 100 MG PO TB24: 100.0000 mg | ORAL_TABLET | Freq: Every day | ORAL | 3 refills | Status: DC

## 2019-04-23 NOTE — Telephone Encounter (Signed)
New Message    Pt is trying to send copy of her EKG    Please call

## 2019-04-23 NOTE — Telephone Encounter (Signed)
This is PSVT. No atrial fib. DC atenolol and start Metoprolol succinate 100 mg AM . May eventually need 100 mg AM and 50 mg PM if continues despite switch. This could be cured with ablation if she is interested.   How long are episodes lasting?    Spoke with pt and went over recommendations.  Pt verbalized understanding and was in agreement with plan.  Pt states episodes last about 90-120 seconds.  Occasionally has a fluttering feeling that lasts maybe 2 seconds.

## 2019-04-23 NOTE — Telephone Encounter (Signed)
Ok

## 2019-04-24 DIAGNOSIS — M545 Low back pain: Secondary | ICD-10-CM | POA: Diagnosis not present

## 2019-04-24 DIAGNOSIS — I471 Supraventricular tachycardia: Secondary | ICD-10-CM | POA: Diagnosis not present

## 2019-04-24 DIAGNOSIS — G8929 Other chronic pain: Secondary | ICD-10-CM | POA: Diagnosis not present

## 2019-04-24 DIAGNOSIS — I10 Essential (primary) hypertension: Secondary | ICD-10-CM | POA: Diagnosis not present

## 2019-07-02 DIAGNOSIS — M1711 Unilateral primary osteoarthritis, right knee: Secondary | ICD-10-CM | POA: Diagnosis not present

## 2019-07-30 DIAGNOSIS — M1712 Unilateral primary osteoarthritis, left knee: Secondary | ICD-10-CM | POA: Diagnosis not present

## 2019-07-30 DIAGNOSIS — I10 Essential (primary) hypertension: Secondary | ICD-10-CM | POA: Diagnosis not present

## 2019-07-30 DIAGNOSIS — E7800 Pure hypercholesterolemia, unspecified: Secondary | ICD-10-CM | POA: Diagnosis not present

## 2019-07-31 NOTE — Progress Notes (Addendum)
Cardiology Office Note:    Date:  08/06/2019   ID:  Stephanie Newton, DOB [birth year], MRN [id number]  PCP:  Lajean Manes, MD  Cardiologist:  Sinclair Grooms, MD   Referring MD: Lajean Manes, MD   Chief Complaint  Patient presents with  . Coronary Artery Disease  . Advice Only    PSVT    History of Present Illness:    Stephanie Newton is a 78 y.o. female with a hx of  hyperlipidemia, hypertension, tobacco use, NSTEMI, PSVT, and LM saccular aneurysm.  Had an episode of SVT that lasted 90 seconds earlier this year.  We changed beta-blocker and altered intensity and she seems to be doing well.  She has had a lot of difficulty with her knees and therefore not exercising.  She is also taken nonsteroidals.  Her blood pressure is higher now than it has been.  Otherwise no complaints.  Occasional palpitations.  No prolonged tacky arrhythmia since the episode earlier this year.  Toprol was started and Tenormin stopped.  Past Medical History:  Diagnosis Date  . Hypercholesteremia   . Hypertension     Past Surgical History:  Procedure Laterality Date  . BREAST BIOPSY     bilateral   . EYE SURGERY     cataract bilateral   . HERNIA REPAIR  06/2000   INCISIONAL VENTRAL ; DR Nedra Hai   . KNEE ARTHROSCOPY Left 08/23/2017   dr Theda Sers   . LAPAROSCOPIC CHOLECYSTECTOMY  06/13/2000   DR Nedra Hai   . LEFT HEART CATH AND CORONARY ANGIOGRAPHY N/A 12/04/2018   Procedure: LEFT HEART CATH AND CORONARY ANGIOGRAPHY;  Surgeon: Belva Crome, MD;  Location: [facility];  Service: Cardiovascular;  Laterality: N/A;  . TOTAL KNEE ARTHROPLASTY Left 03/31/2018   Procedure: LEFT TOTAL KNEE ARTHROPLASTY;  Surgeon: Sydnee Cabal, MD;  Location: [facility];  Service: Orthopedics;  Laterality: Left;  with block  . VAGINAL HYSTERECTOMY  age 84s    Current Medications: Current Meds  Medication Sig  . aspirin EC 81 MG EC tablet Take 1 tablet (81 mg total) by mouth daily.  Marland Kitchen estradiol  (ESTRACE) 1 MG tablet Take 1 mg by mouth daily.  . hydrochlorothiazide (MICROZIDE) 12.5 MG capsule Take 12.5 mg by mouth daily.  Marland Kitchen losartan (COZAAR) 50 MG tablet Take 1 tablet (50 mg total) by mouth daily.  . meloxicam (MOBIC) 15 MG tablet Take 15 mg by mouth daily as needed.  . metoprolol succinate (TOPROL-XL) 100 MG 24 hr tablet Take 1 tablet (100 mg total) by mouth daily. Take with or immediately following a meal.  . simvastatin (ZOCOR) 20 MG tablet Take 20 mg by mouth daily.  . [DISCONTINUED] clopidogrel (PLAVIX) 75 MG tablet Take 1 tablet (75 mg total) by mouth daily.     Allergies:   Shellfish allergy   Social History   Socioeconomic History  . Marital status: Married    Spouse name: Not on file  . Number of children: Not on file  . Years of education: Not on file  . Highest education level: Not on file  Occupational History  . Not on file  Social Needs  . Financial resource strain: Patient refused  . Food insecurity    Worry: Patient refused    Inability: Patient refused  . Transportation needs    Medical: Patient refused    Non-medical: Patient refused  Tobacco Use  . Smoking status: Current Some Day Smoker    Years: 30.00  Types: Cigarettes  . Smokeless tobacco: Never Used  . Tobacco comment: 6 cigarettes a day  Substance and Sexual Activity  . Alcohol use: Yes    Comment: occasionally   . Drug use: No  . Sexual activity: Yes  Lifestyle  . Physical activity    Days per week: Patient refused    Minutes per session: Patient refused  . Stress: Patient refused  Relationships  . Social Herbalist on phone: Patient refused    Gets together: Patient refused    Attends religious service: Patient refused    Active member of club or organization: Patient refused    Attends meetings of clubs or organizations: Patient refused    Relationship status: Patient refused  Other Topics Concern  . Not on file  Social History Narrative  . Not on file      Family History: The patient's family history is not on file.  ROS:   Please see the history of present illness.    Knee discomfort otherwise doing okay.  Not exercising.  Gaining weight.  All other systems reviewed and are negative.  EKGs/Labs/Other Studies Reviewed:    The following studies were reviewed today: No recent cardiac data  EKG:  EKG demonstrates normal sinus rhythm with poor R wave progression but otherwise unremarkable.  PR interval is 182 ms.  Recent Labs: 12/03/2018: B Natriuretic Peptide 68.2; TSH 1.404 12/05/2018: Hemoglobin 13.4; Magnesium 2.0; Platelets 187 02/01/2019: BUN 14; Creatinine, Ser 0.78; Potassium 4.2; Sodium 142  Recent Lipid Panel    Component Value Date/Time   CHOL 193 12/03/2018 1447   TRIG 211 (H) 12/03/2018 1447   HDL 77 12/03/2018 1447   CHOLHDL 2.5 12/03/2018 1447   VLDL 42 (H) 12/03/2018 1447   LDLCALC 74 12/03/2018 1447    Physical Exam:    VS:  BP (!) 142/80   Pulse 63   Ht 5\' 5"  (1.651 m)   Wt 163 lb 6.4 oz (74.1 kg)   SpO2 98%   BMI 27.19 kg/m     Wt Readings from Last 3 Encounters:  08/01/19 163 lb 6.4 oz (74.1 kg)  01/18/19 155 lb (70.3 kg)  12/15/18 156 lb (70.8 kg)     GEN: Appears younger than stated age. No acute distress HEENT: Normal NECK: No JVD. LYMPHATICS: No lymphadenopathy CARDIAC:  RRR without murmur, gallop, or edema. VASCULAR:  Normal Pulses. No bruits. RESPIRATORY:  Clear to auscultation without rales, wheezing or rhonchi  ABDOMEN: Soft, non-tender, non-distended, No pulsatile mass, MUSCULOSKELETAL: No deformity  SKIN: Warm and dry NEUROLOGIC:  Alert and oriented x 3 PSYCHIATRIC:  Normal affect   ASSESSMENT:    1. Coronary artery aneurysm   2. Essential hypertension   3. PSVT (paroxysmal supraventricular tachycardia) ([place])   4. Mixed hyperlipidemia   5. Tobacco use   6. Educated About Covid-19 Virus Infection    PLAN:    In order of problems listed above:  1. Stable without angina.   Keeping blood pressure low.  Discontinue Plavix. 2. Blood pressure is a little high.  Cut back on nonsteroidals.  Monitor blood pressure at home.  If it remains high we should increase Cozaar to 100 mg/day. 3. If recurrent PSVT, we can further increase beta-blocker.  We discussed ablation but I do not believe she is symptomatic enough for this to be pursued at this time. 4. Target LDL less than 70 5. She has stopped smoking 6. Social distancing, mask wearing, and handwashing discussed.  6  to 44-month follow-up.  Monitor blood pressure at home and notify us of results.  If they remain elevated we will need to adjust her therapy.   Medication Adjustments/Labs and Tests Ordered: Current medicines are reviewed at length with the patient today.  Concerns regarding medicines are outlined above.  Orders Placed This Encounter  Procedures  . EKG 12-Lead   No orders of the defined types were placed in this encounter.   Patient Instructions  Medication Instructions:  1) DISCONTINUE Plavix  If you need a refill on your cardiac medications before your next appointment, please call your pharmacy.   Lab work: None If you have labs (blood work) drawn today and your tests are completely normal, you will receive your results only by: Marland Kitchen MyChart Message (if you have MyChart) OR . A paper copy in the mail If you have any lab test that is abnormal or we need to change your treatment, we will call you to review the results.  Testing/Procedures: None  Follow-Up: At [facility], you and your health needs are our priority.  As part of our continuing mission to provide you with exceptional heart care, we have created designated Provider Care Teams.  These Care Teams include your primary Cardiologist (physician) and Advanced Practice Providers (APPs -  Physician Assistants and Nurse Practitioners) who all work together to provide you with the care you need, when you need it. You will need a follow up  appointment in 6-8 months.  Please call our office 2 months in advance to schedule this appointment.  You may see Sinclair Grooms, MD or one of the following Advanced Practice Providers on your designated Care Team:   Truitt Merle, NP Cecilie Kicks, NP . Kathyrn Drown, NP  Any Other Special Instructions Will Be Listed Below (If Applicable).  Monitor your blood pressure 2-3 times per week and call us with those readings or send them through MyChart in about a month.  Your goal blood pressure is 130/80.      Signed, Sinclair Grooms, MD  08/06/2019 6:23 PM    [place]

## 2019-08-01 ENCOUNTER — Ambulatory Visit (INDEPENDENT_AMBULATORY_CARE_PROVIDER_SITE_OTHER): Payer: Medicare Other | Admitting: Interventional Cardiology

## 2019-08-01 ENCOUNTER — Other Ambulatory Visit: Payer: Self-pay

## 2019-08-01 ENCOUNTER — Encounter: Payer: Self-pay | Admitting: Interventional Cardiology

## 2019-08-01 VITALS — BP 142/80 | HR 63 | Ht 65.0 in | Wt 163.4 lb

## 2019-08-01 DIAGNOSIS — Z7189 Other specified counseling: Secondary | ICD-10-CM

## 2019-08-01 DIAGNOSIS — I2541 Coronary artery aneurysm: Secondary | ICD-10-CM

## 2019-08-01 DIAGNOSIS — I471 Supraventricular tachycardia: Secondary | ICD-10-CM

## 2019-08-01 DIAGNOSIS — E782 Mixed hyperlipidemia: Secondary | ICD-10-CM | POA: Diagnosis not present

## 2019-08-01 DIAGNOSIS — I2510 Atherosclerotic heart disease of native coronary artery without angina pectoris: Secondary | ICD-10-CM

## 2019-08-01 DIAGNOSIS — I10 Essential (primary) hypertension: Secondary | ICD-10-CM | POA: Diagnosis not present

## 2019-08-01 DIAGNOSIS — Z720 Tobacco use: Secondary | ICD-10-CM

## 2019-08-01 DIAGNOSIS — N183 Chronic kidney disease, stage 3 (moderate): Secondary | ICD-10-CM | POA: Diagnosis not present

## 2019-08-01 NOTE — Patient Instructions (Addendum)
Medication Instructions:  1) DISCONTINUE Plavix  If you need a refill on your cardiac medications before your next appointment, please call your pharmacy.   Lab work: None If you have labs (blood work) drawn today and your tests are completely normal, you will receive your results only by: Marland Kitchen MyChart Message (if you have MyChart) OR . A paper copy in the mail If you have any lab test that is abnormal or we need to change your treatment, we will call you to review the results.  Testing/Procedures: None  Follow-Up: At [facility], you and your health needs are our priority.  As part of our continuing mission to provide you with exceptional heart care, we have created designated Provider Care Teams.  These Care Teams include your primary Cardiologist (physician) and Advanced Practice Providers (APPs -  Physician Assistants and Nurse Practitioners) who all work together to provide you with the care you need, when you need it. You will need a follow up appointment in 6-8 months.  Please call our office 2 months in advance to schedule this appointment.  You may see Sinclair Grooms, MD or one of the following Advanced Practice Providers on your designated Care Team:   Truitt Merle, NP Cecilie Kicks, NP . Kathyrn Drown, NP  Any Other Special Instructions Will Be Listed Below (If Applicable).  Monitor your blood pressure 2-3 times per week and call us with those readings or send them through MyChart in about a month.  Your goal blood pressure is 130/80.

## 2019-08-06 DIAGNOSIS — M1711 Unilateral primary osteoarthritis, right knee: Secondary | ICD-10-CM | POA: Diagnosis not present

## 2019-08-08 ENCOUNTER — Telehealth: Payer: Self-pay | Admitting: Interventional Cardiology

## 2019-08-08 NOTE — Telephone Encounter (Signed)
  Patient is calling regarding an EKG she had done on 08/01/19, she has some questions.

## 2019-08-08 NOTE — Telephone Encounter (Signed)
Spoke with pt and she was concerned about EKG saying "cannot rule out anterior infarct".  Reviewed possible reasons why this showed up.  Advised if Dr. Tamala Julian didn't mention it during the visit, then there is no concern there.  Pt appreciative for call.

## 2019-08-15 DIAGNOSIS — M1711 Unilateral primary osteoarthritis, right knee: Secondary | ICD-10-CM | POA: Diagnosis not present

## 2019-08-22 DIAGNOSIS — M1711 Unilateral primary osteoarthritis, right knee: Secondary | ICD-10-CM | POA: Diagnosis not present

## 2019-10-03 DIAGNOSIS — T8454XA Infection and inflammatory reaction due to internal left knee prosthesis, initial encounter: Secondary | ICD-10-CM | POA: Diagnosis not present

## 2019-10-03 DIAGNOSIS — M1711 Unilateral primary osteoarthritis, right knee: Secondary | ICD-10-CM | POA: Diagnosis not present

## 2019-10-05 ENCOUNTER — Other Ambulatory Visit (HOSPITAL_COMMUNITY): Payer: Self-pay | Admitting: Specialist

## 2019-10-05 ENCOUNTER — Other Ambulatory Visit: Payer: Self-pay | Admitting: Specialist

## 2019-10-05 DIAGNOSIS — T8454XA Infection and inflammatory reaction due to internal left knee prosthesis, initial encounter: Secondary | ICD-10-CM

## 2019-10-16 ENCOUNTER — Other Ambulatory Visit: Payer: Self-pay

## 2019-10-16 ENCOUNTER — Encounter (HOSPITAL_COMMUNITY)
Admission: RE | Admit: 2019-10-16 | Discharge: 2019-10-16 | Disposition: A | Discharge status: Home / Self Care | Payer: Medicare Other | Source: Ambulatory Visit | Attending: Specialist | Admitting: Specialist

## 2019-10-16 DIAGNOSIS — T8454XA Infection and inflammatory reaction due to internal left knee prosthesis, initial encounter: Secondary | ICD-10-CM | POA: Diagnosis not present

## 2019-10-16 DIAGNOSIS — Z471 Aftercare following joint replacement surgery: Secondary | ICD-10-CM | POA: Diagnosis not present

## 2019-10-16 DIAGNOSIS — Z96652 Presence of left artificial knee joint: Secondary | ICD-10-CM | POA: Diagnosis not present

## 2019-10-16 MED ORDER — TECHNETIUM TC 99M MEDRONATE IV KIT
21.3000 | PACK | Freq: Once | INTRAVENOUS | Status: AC
  Administered 2019-10-16: 21.3 via INTRAVENOUS

## 2019-10-25 DIAGNOSIS — Z20828 Contact with and (suspected) exposure to other viral communicable diseases: Secondary | ICD-10-CM | POA: Diagnosis not present

## 2019-10-30 ENCOUNTER — Other Ambulatory Visit: Payer: Self-pay | Admitting: Interventional Cardiology

## 2019-10-30 DIAGNOSIS — I10 Essential (primary) hypertension: Secondary | ICD-10-CM | POA: Diagnosis not present

## 2019-10-30 DIAGNOSIS — M1712 Unilateral primary osteoarthritis, left knee: Secondary | ICD-10-CM | POA: Diagnosis not present

## 2019-10-30 DIAGNOSIS — E7800 Pure hypercholesterolemia, unspecified: Secondary | ICD-10-CM | POA: Diagnosis not present

## 2019-10-30 MED ORDER — METOPROLOL SUCCINATE ER 100 MG PO TB24: 100.0000 mg | ORAL_TABLET | Freq: Every day | ORAL | 2 refills | Status: DC

## 2019-12-12 DIAGNOSIS — M542 Cervicalgia: Secondary | ICD-10-CM | POA: Diagnosis not present

## 2019-12-12 DIAGNOSIS — I10 Essential (primary) hypertension: Secondary | ICD-10-CM | POA: Diagnosis not present

## 2019-12-12 DIAGNOSIS — Z79899 Other long term (current) drug therapy: Secondary | ICD-10-CM | POA: Diagnosis not present

## 2019-12-12 DIAGNOSIS — E7800 Pure hypercholesterolemia, unspecified: Secondary | ICD-10-CM | POA: Diagnosis not present

## 2019-12-12 DIAGNOSIS — I2541 Coronary artery aneurysm: Secondary | ICD-10-CM | POA: Diagnosis not present

## 2019-12-12 DIAGNOSIS — I471 Supraventricular tachycardia: Secondary | ICD-10-CM | POA: Diagnosis not present

## 2020-01-01 DIAGNOSIS — E7800 Pure hypercholesterolemia, unspecified: Secondary | ICD-10-CM | POA: Diagnosis not present

## 2020-01-01 DIAGNOSIS — M1712 Unilateral primary osteoarthritis, left knee: Secondary | ICD-10-CM | POA: Diagnosis not present

## 2020-01-01 DIAGNOSIS — I10 Essential (primary) hypertension: Secondary | ICD-10-CM | POA: Diagnosis not present

## 2020-01-03 DIAGNOSIS — M1711 Unilateral primary osteoarthritis, right knee: Secondary | ICD-10-CM | POA: Diagnosis not present

## 2020-01-03 DIAGNOSIS — Z96652 Presence of left artificial knee joint: Secondary | ICD-10-CM | POA: Diagnosis not present

## 2020-01-03 DIAGNOSIS — Z471 Aftercare following joint replacement surgery: Secondary | ICD-10-CM | POA: Diagnosis not present

## 2020-01-21 ENCOUNTER — Ambulatory Visit (INDEPENDENT_AMBULATORY_CARE_PROVIDER_SITE_OTHER): Payer: Medicare Other | Admitting: Interventional Cardiology

## 2020-01-21 ENCOUNTER — Other Ambulatory Visit: Payer: Self-pay

## 2020-01-21 ENCOUNTER — Encounter: Payer: Self-pay | Admitting: Interventional Cardiology

## 2020-01-21 VITALS — BP 114/76 | HR 65 | Ht 65.0 in | Wt 164.8 lb

## 2020-01-21 DIAGNOSIS — I2541 Coronary artery aneurysm: Secondary | ICD-10-CM | POA: Diagnosis not present

## 2020-01-21 DIAGNOSIS — E782 Mixed hyperlipidemia: Secondary | ICD-10-CM

## 2020-01-21 DIAGNOSIS — Z720 Tobacco use: Secondary | ICD-10-CM | POA: Diagnosis not present

## 2020-01-21 DIAGNOSIS — I2510 Atherosclerotic heart disease of native coronary artery without angina pectoris: Secondary | ICD-10-CM

## 2020-01-21 DIAGNOSIS — Z7189 Other specified counseling: Secondary | ICD-10-CM

## 2020-01-21 DIAGNOSIS — I10 Essential (primary) hypertension: Secondary | ICD-10-CM

## 2020-01-21 DIAGNOSIS — I471 Supraventricular tachycardia: Secondary | ICD-10-CM | POA: Diagnosis not present

## 2020-01-21 NOTE — Patient Instructions (Signed)

## 2020-01-21 NOTE — Progress Notes (Signed)
Cardiology Office Note:    Date:  01/21/2020   ID:  Stephanie Newton, DOB 1940/12/22, MRN [id number]  PCP:  Lajean Manes, MD  Cardiologist:  Sinclair Grooms, MD   Referring MD: Lajean Manes, MD   Chief Complaint  Patient presents with  . Coronary Artery Disease  . Advice Only    Coronary aneurysm    History of Present Illness:    Stephanie Newton is a 79 y.o. female with a hx of hyperlipidemia, hypertension, tobacco use, NSTEMI, PSVT, and LM saccular aneurysm.  She denies angina and has had minimal episodes of very brief palpitation.  She is limited due to bilateral severe osteoarthritis.  Left knee greater than right.  Left knee is status post replacement and repair.  The right knee is also a problem.  She denies orthopnea, PND, and syncope.  No peripheral edema is noted.  Past Medical History:  Diagnosis Date  . Hypercholesteremia   . Hypertension     Past Surgical History:  Procedure Laterality Date  . BREAST BIOPSY     bilateral   . EYE SURGERY     cataract bilateral   . HERNIA REPAIR  06/2000   INCISIONAL VENTRAL ; DR Nedra Hai   . KNEE ARTHROSCOPY Left 08/23/2017   dr Theda Sers   . LAPAROSCOPIC CHOLECYSTECTOMY  06/13/2000   DR Nedra Hai   . LEFT HEART CATH AND CORONARY ANGIOGRAPHY N/A 12/04/2018   Procedure: LEFT HEART CATH AND CORONARY ANGIOGRAPHY;  Surgeon: Belva Crome, MD;  Location: [facility];  Service: Cardiovascular;  Laterality: N/A;  . TOTAL KNEE ARTHROPLASTY Left 03/31/2018   Procedure: LEFT TOTAL KNEE ARTHROPLASTY;  Surgeon: Sydnee Cabal, MD;  Location: [facility];  Service: Orthopedics;  Laterality: Left;  with block  . VAGINAL HYSTERECTOMY  age 72s    Current Medications: Current Meds  Medication Sig  . aspirin EC 81 MG EC tablet Take 1 tablet (81 mg total) by mouth daily.  Marland Kitchen estradiol (ESTRACE) 1 MG tablet Take 1 mg by mouth daily.  . hydrochlorothiazide (MICROZIDE) 12.5 MG capsule Take 12.5 mg by mouth daily.  Marland Kitchen  HYDROcodone-acetaminophen (NORCO/VICODIN) 5-325 MG tablet Take 1 tablet by mouth every 6 (six) hours.  Marland Kitchen losartan (COZAAR) 50 MG tablet Take 50 mg by mouth daily.  . meloxicam (MOBIC) 15 MG tablet Take 15 mg by mouth daily as needed.  . metoprolol succinate (TOPROL-XL) 100 MG 24 hr tablet Take 1 tablet (100 mg total) by mouth daily. Take with or immediately following a meal.  . simvastatin (ZOCOR) 20 MG tablet Take 20 mg by mouth daily.  . traMADol (ULTRAM) 50 MG tablet Take 50-100 mg by mouth every 6 (six) hours as needed.     Allergies:   Shellfish allergy   Social History   Socioeconomic History  . Marital status: Married    Spouse name: Not on file  . Number of children: Not on file  . Years of education: Not on file  . Highest education level: Not on file  Occupational History  . Not on file  Tobacco Use  . Smoking status: Current Some Day Smoker    Years: 30.00    Types: Cigarettes  . Smokeless tobacco: Never Used  . Tobacco comment: 6 cigarettes a day  Substance and Sexual Activity  . Alcohol use: Yes    Comment: occasionally   . Drug use: No  . Sexual activity: Yes  Other Topics Concern  . Not on file  Social History Narrative  . Not on file   Social Determinants of Health   Financial Resource Strain:   . Difficulty of Paying Living Expenses: Not on file  Food Insecurity:   . Worried About Charity fundraiser in the Last Year: Not on file  . Ran Out of Food in the Last Year: Not on file  Transportation Needs:   . Lack of Transportation (Medical): Not on file  . Lack of Transportation (Non-Medical): Not on file  Physical Activity:   . Days of Exercise per Week: Not on file  . Minutes of Exercise per Session: Not on file  Stress:   . Feeling of Stress : Not on file  Social Connections:   . Frequency of Communication with Friends and Family: Not on file  . Frequency of Social Gatherings with Friends and Family: Not on file  . Attends Religious Services: Not  on file  . Active Member of Clubs or Organizations: Not on file  . Attends Archivist Meetings: Not on file  . Marital Status: Not on file     Family History: The patient's family history is not on file.  ROS:   Please see the history of present illness.    Limited physical activity all other systems reviewed and are negative.  EKGs/Labs/Other Studies Reviewed:    The following studies were reviewed today:  CT angiogram of coronary arteries January 2020: IMPRESSION: 1. Coronary artery calcium score 0 Agatston units. This suggests low risk for future cardiac events.  2. Saccular aneurysm arising from the left main at the trifurcation of the LAD/LCx/ramus as described above.  3. 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  4.  No significant coronary disease.  [name]   Electronically Signed   By: Loralie Champagne M.D.   On: 01/05/2019 17:13   Addended by Larey Dresser, MD on 01/05/2019 5:15 PM        EKG:  EKG ECG is not performed on today's visit  Recent Labs: 02/01/2019: BUN 14; Creatinine, Ser 0.78; Potassium 4.2; Sodium 142  Recent Lipid Panel    Component Value Date/Time   CHOL 193 12/03/2018 1447   TRIG 211 (H) 12/03/2018 1447   HDL 77 12/03/2018 1447   CHOLHDL 2.5 12/03/2018 1447   VLDL 42 (H) 12/03/2018 1447   LDLCALC 74 12/03/2018 1447    Physical Exam:    VS:  BP 114/76   Pulse 65   Ht 5\' 5"  (1.651 m)   Wt 164 lb 12.8 oz (74.8 kg)   SpO2 98%   BMI 27.42 kg/m     Wt Readings from Last 3 Encounters:  01/21/20 164 lb 12.8 oz (74.8 kg)  08/01/19 163 lb 6.4 oz (74.1 kg)  01/18/19 155 lb (70.3 kg)     GEN: Mild abdominal obesity.. No acute distress HEENT: Normal NECK: No JVD. LYMPHATICS: No lymphadenopathy CARDIAC:  RRR without murmur, gallop, or edema. VASCULAR:  Normal Pulses. No bruits. RESPIRATORY:  Clear to auscultation without rales, wheezing or rhonchi  ABDOMEN: Soft, non-tender,  non-distended, No pulsatile mass, MUSCULOSKELETAL: No deformity  SKIN: Warm and dry NEUROLOGIC:  Alert and oriented x 3 PSYCHIATRIC:  Normal affect   ASSESSMENT:    1. Coronary artery aneurysm   2. Essential hypertension   3. PSVT (paroxysmal supraventricular tachycardia) ([place])   4. Mixed hyperlipidemia   5. Tobacco use   6. Educated about COVID-19 virus infection    PLAN:  In order of problems listed above:  1. Coronary aneurysm at the distal left main within the trifurcation of the LAD circumflex and ramus intermedius.  Patient is asymptomatic. 2. Improved blood pressure control off nonsteroidal anti-inflammatory therapy. 3. Controlled on beta-blocker therapy. 4. Most recent LDL was 76 on relatively low-dose statin therapy. 5. Not currently smoking. 6. Has had the COVID-19 vaccine and is still embracing 3W's.  Overall education and awareness concerning primary/secondary risk prevention was discussed in detail: LDL less than 70, hemoglobin A1c less than 7, blood pressure target less than 130/80 mmHg, >150 minutes of moderate aerobic activity per week, avoidance of smoking, weight control (via diet and exercise), and continued surveillance/management of/for obstructive sleep apnea.    Medication Adjustments/Labs and Tests Ordered: Current medicines are reviewed at length with the patient today.  Concerns regarding medicines are outlined above.  No orders of the defined types were placed in this encounter.  No orders of the defined types were placed in this encounter.   Patient Instructions  Medication Instructions:  Your physician recommends that you continue on your current medications as directed. Please refer to the Current Medication list given to you today.  *If you need a refill on your cardiac medications before your next appointment, please call your pharmacy*  Lab Work: None If you have labs (blood work) drawn today and your tests are completely normal, you  will receive your results only by: Marland Kitchen MyChart Message (if you have MyChart) OR . A paper copy in the mail If you have any lab test that is abnormal or we need to change your treatment, we will call you to review the results.  Testing/Procedures: None  Follow-Up: At [facility], you and your health needs are our priority.  As part of our continuing mission to provide you with exceptional heart care, we have created designated Provider Care Teams.  These Care Teams include your primary Cardiologist (physician) and Advanced Practice Providers (APPs -  Physician Assistants and Nurse Practitioners) who all work together to provide you with the care you need, when you need it.  Your next appointment:   12 month(s)  The format for your next appointment:   In Person  Provider:   You may see Sinclair Grooms, MD or one of the following Advanced Practice Providers on your designated Care Team:    Truitt Merle, NP  Cecilie Kicks, NP  Kathyrn Drown, NP   Other Instructions      Signed, Sinclair Grooms, MD  01/21/2020 2:22 PM    [place]

## 2020-01-23 IMAGING — CT CT ANGIO CHEST
2 of 6 series · 18 of 36 positions shown · IV contrast (iopamidol)
Comparison: Chest x-ray dated 12/03/2018

CLINICAL DATA: Palpitations.

EXAM:
CT ANGIOGRAPHY CHEST WITH CONTRAST
TECHNIQUE: Multidetector CT imaging of the chest was performed using the
standard protocol during bolus administration of intravenous
contrast. Multiplanar CT image reconstructions and MIPs were
obtained to evaluate the vascular anatomy.
CONTRAST:  100mL JWC1LN-KH2 IOPAMIDOL (JWC1LN-KH2) INJECTION 76%

[Series 8: pe thins · axial · 0.60mm/px · z∈[-198,+29]mm · 17 of 363 slices shown]
[im 19/363  lung]
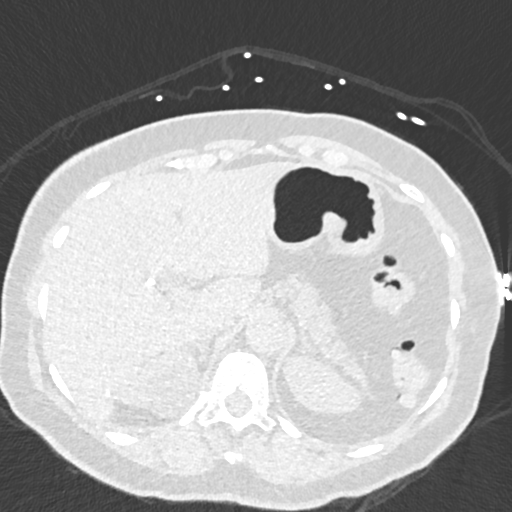
[im 37/363  mediastinal]
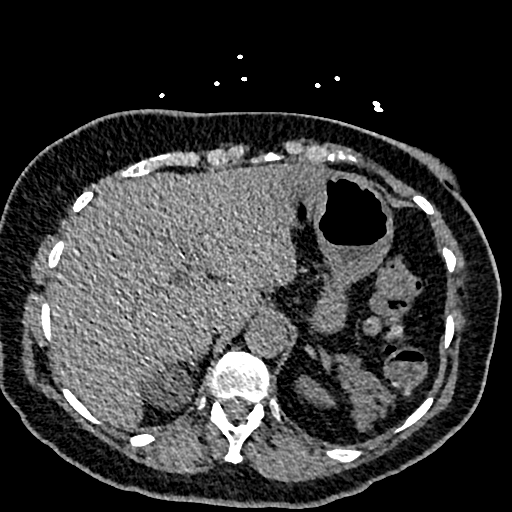
[im 55/363  lung]
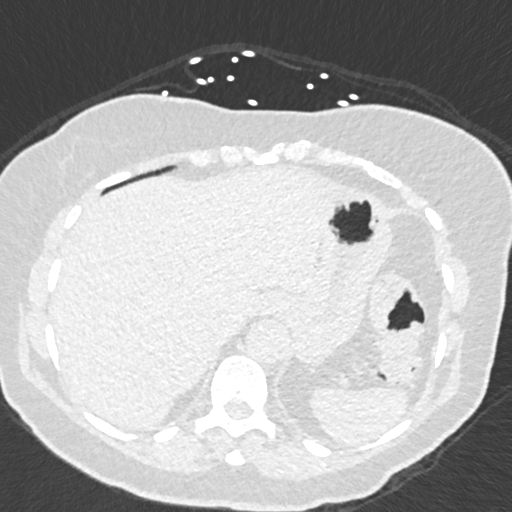
[im 73/363  mediastinal]
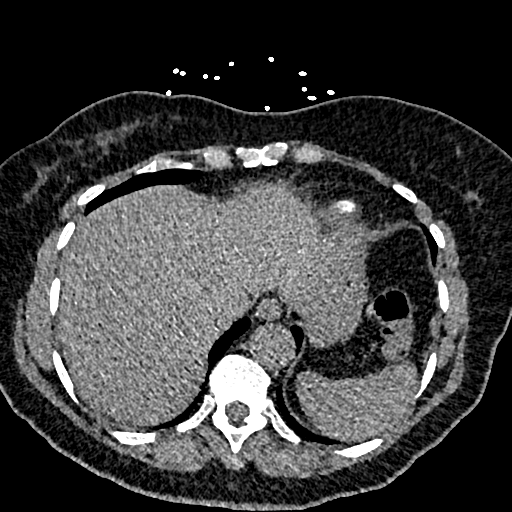
[im 109/363  lung]
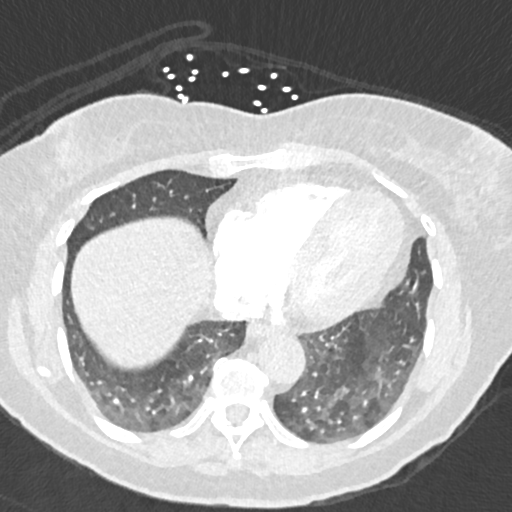
[im 127/363  mediastinal]
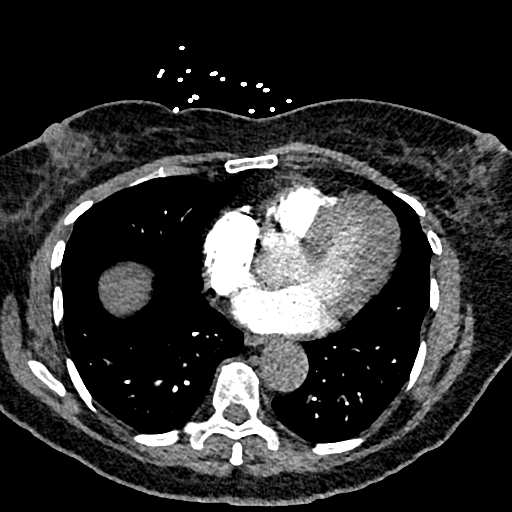
[im 145/363  lung]
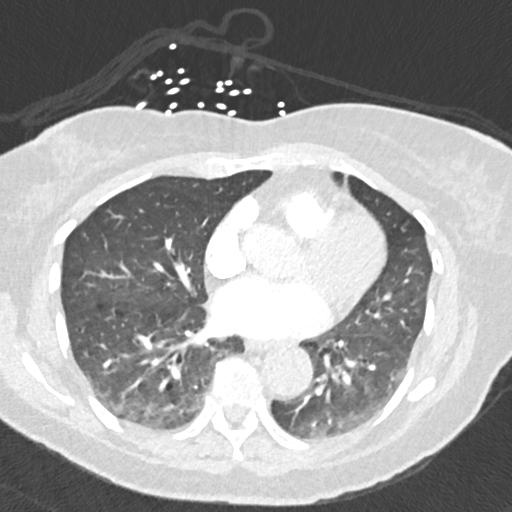
[im 163/363  mediastinal]
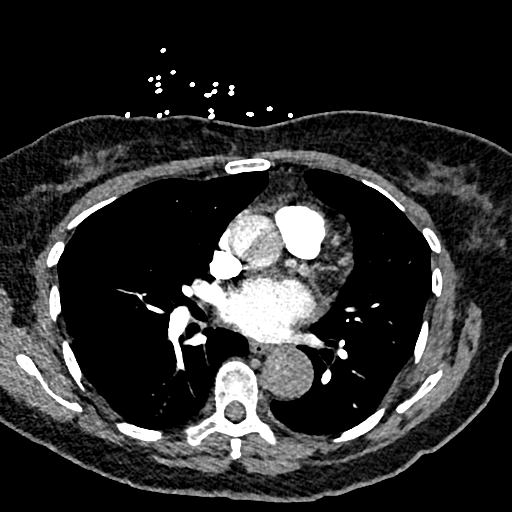
[im 182/363  lung]
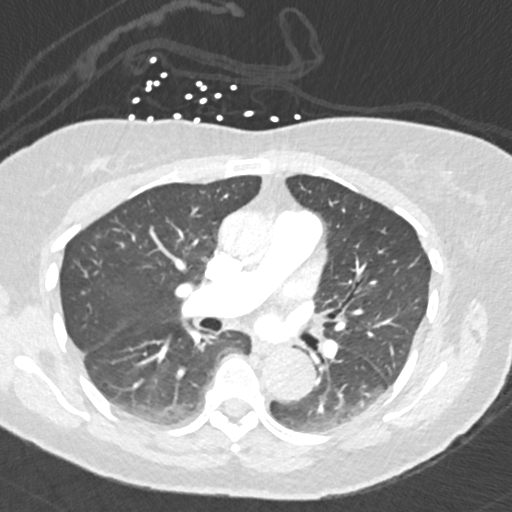
[im 200/363  mediastinal]
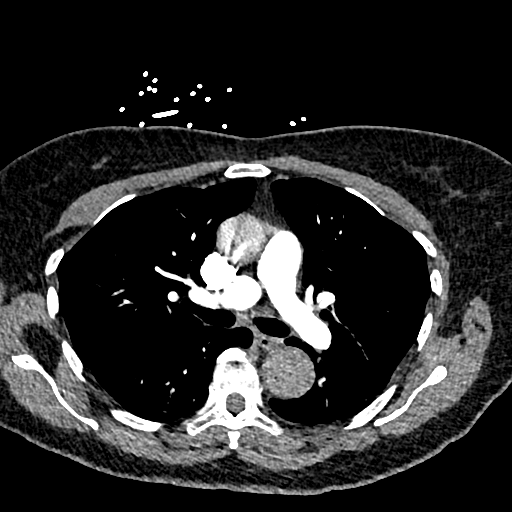
[im 218/363  lung]
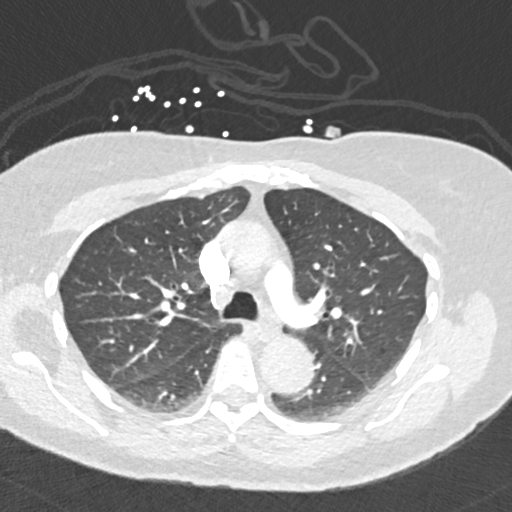
[im 236/363  mediastinal]
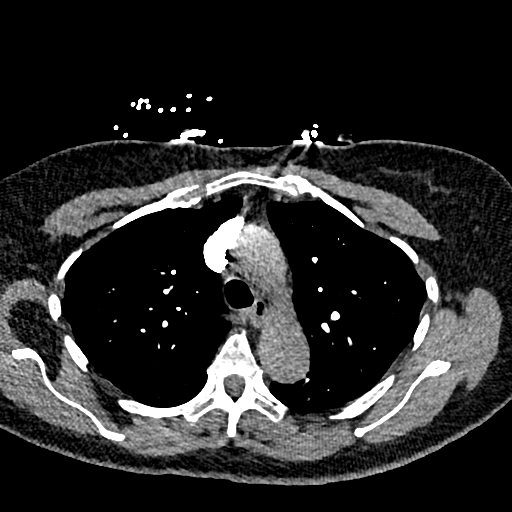
[im 254/363  lung]
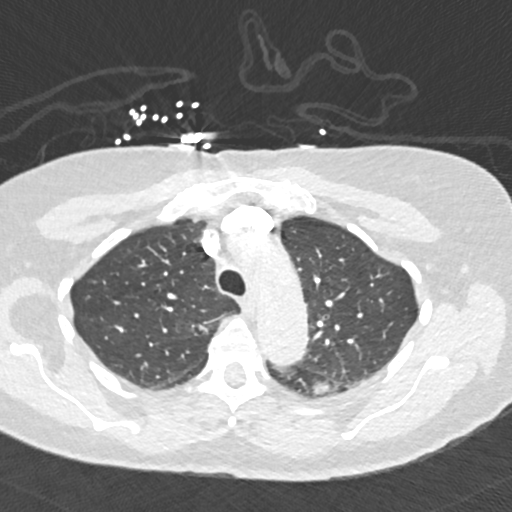
[im 290/363  mediastinal]
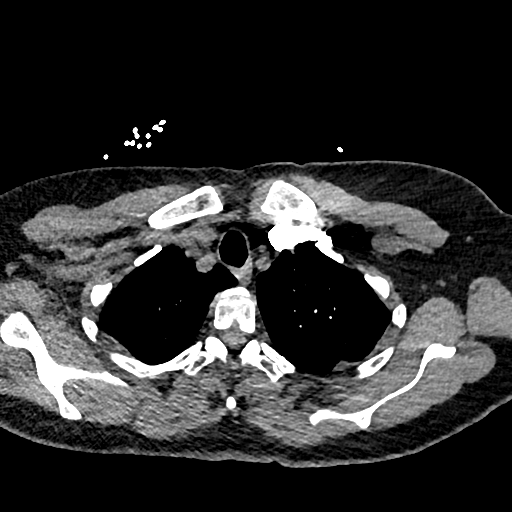
[im 308/363  lung]
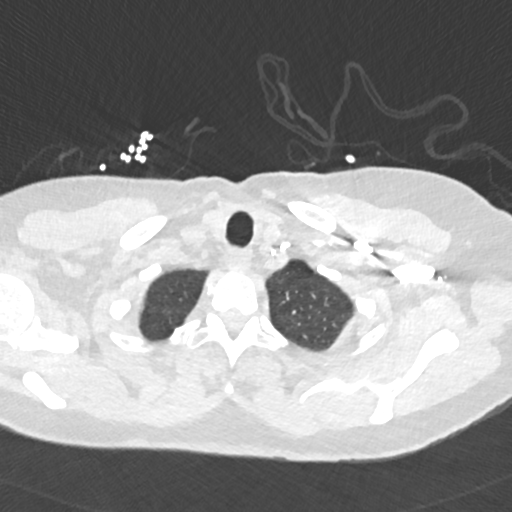
[im 326/363  mediastinal]
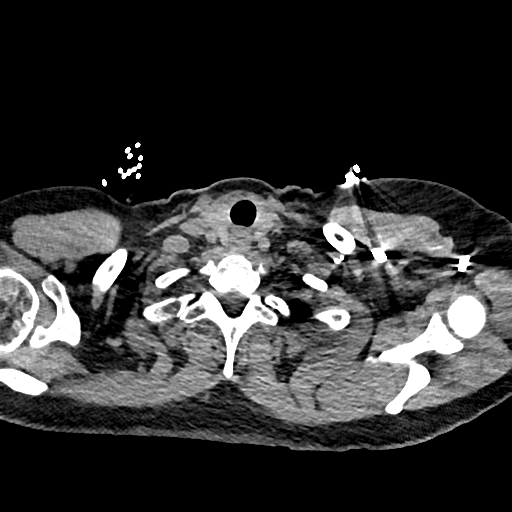
[im 344/363  lung]
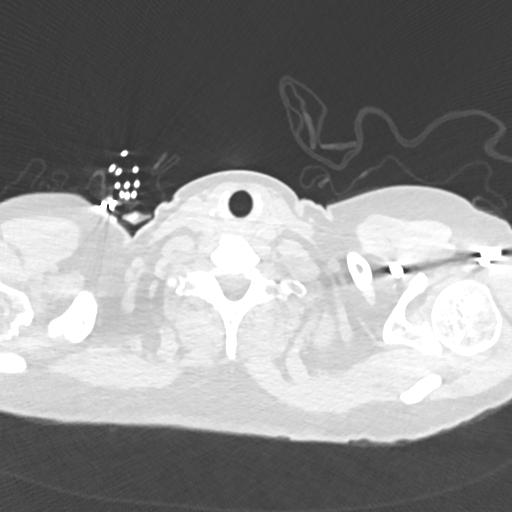

[Series 9: pe 2mm cor · coronal · 0.52mm/px · 1 of 146 slices shown]
[im 73/146  mediastinal]
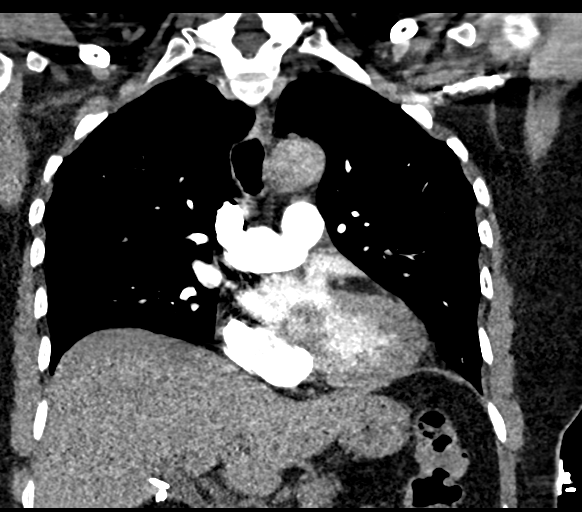

[18 of 36 positions shown; findings below may reference images not displayed]

FINDINGS: Cardiovascular: Satisfactory opacification of the pulmonary arteries
to the segmental level. No evidence of pulmonary embolism. Normal
heart size. No pericardial effusion.

Mediastinum/Nodes: No enlarged mediastinal, hilar, or axillary lymph
nodes. Thyroid gland, trachea, and esophagus demonstrate no
significant findings.

Lungs/Pleura: There is an ill-defined 9 mm nodule in the superior
segment of the left lower lobe on image 38 and an 8 mm slightly
lobulated nodule in the left lower lobe posterior medially on image
number 93. There is a 5 mm ill-defined nodule in the right upper
lobe on image 37. All of these are visible on series 6 and series 7.

No infiltrates or effusions.

Upper Abdomen: 3.4 cm low-density lesion in the upper pole of the
right kidney, most likely a cyst. 12 mm low-density lesion in the
anterior aspect of the left lobe of the liver, probably a cyst.
Diverticulosis of the splenic flexure of the colon. Cholecystectomy.

Musculoskeletal: No chest wall abnormality. No acute or significant
osseous findings.

Review of the MIP images confirms the above findings.
IMPRESSION: 1. No pulmonary emboli or other acute abnormalities.
2. Multiple bilateral pulmonary nodules. Non-contrast chest CT at 3
months is recommended. If the nodules are stable at time of repeat
CT, then future CT at 18-24 months (from today's scan) is considered
optional for low-risk patients, but is recommended for high-risk
patients. This recommendation follows the consensus statement:
Guidelines for Management of Incidental Pulmonary Nodules Detected

## 2020-01-28 DIAGNOSIS — M1712 Unilateral primary osteoarthritis, left knee: Secondary | ICD-10-CM | POA: Diagnosis not present

## 2020-01-28 DIAGNOSIS — I10 Essential (primary) hypertension: Secondary | ICD-10-CM | POA: Diagnosis not present

## 2020-01-28 DIAGNOSIS — E7800 Pure hypercholesterolemia, unspecified: Secondary | ICD-10-CM | POA: Diagnosis not present

## 2020-02-19 DIAGNOSIS — G894 Chronic pain syndrome: Secondary | ICD-10-CM | POA: Diagnosis not present

## 2020-03-19 DIAGNOSIS — H524 Presbyopia: Secondary | ICD-10-CM | POA: Diagnosis not present

## 2020-03-19 DIAGNOSIS — H43811 Vitreous degeneration, right eye: Secondary | ICD-10-CM | POA: Diagnosis not present

## 2020-03-19 DIAGNOSIS — H52202 Unspecified astigmatism, left eye: Secondary | ICD-10-CM | POA: Diagnosis not present

## 2020-03-19 DIAGNOSIS — Z961 Presence of intraocular lens: Secondary | ICD-10-CM | POA: Diagnosis not present

## 2020-03-28 DIAGNOSIS — Z96652 Presence of left artificial knee joint: Secondary | ICD-10-CM | POA: Diagnosis not present

## 2020-03-28 DIAGNOSIS — Z471 Aftercare following joint replacement surgery: Secondary | ICD-10-CM | POA: Diagnosis not present

## 2020-03-28 DIAGNOSIS — M1711 Unilateral primary osteoarthritis, right knee: Secondary | ICD-10-CM | POA: Diagnosis not present

## 2020-03-31 ENCOUNTER — Telehealth: Payer: Self-pay | Admitting: *Deleted

## 2020-03-31 NOTE — Telephone Encounter (Signed)
   [facility] Pre-operative Risk Assessment    Request for surgical clearance:  1. What type of surgery is being performed? LEFT KNEE POLY REVISION   2. When is this surgery scheduled? 04/30/20   3. What type of clearance is required (medical clearance vs. Pharmacy clearance to hold med vs. Both)? MEDICAL  4. Are there any medications that need to be held prior to surgery and how long? ASA   5. Practice name and name of physician performing surgery? [facility]; DR. FRANK ALUISIO   6. What is your office phone number [phone]    7.   What is your office fax number [phone]  8.   Anesthesia type (None, local, MAC, general) ? CHOICE   Stephanie Newton 03/31/2020, 4:03 PM  _________________________________________________________________   (provider comments below)

## 2020-04-01 DIAGNOSIS — M1712 Unilateral primary osteoarthritis, left knee: Secondary | ICD-10-CM | POA: Diagnosis not present

## 2020-04-01 DIAGNOSIS — I10 Essential (primary) hypertension: Secondary | ICD-10-CM | POA: Diagnosis not present

## 2020-04-01 DIAGNOSIS — E7800 Pure hypercholesterolemia, unspecified: Secondary | ICD-10-CM | POA: Diagnosis not present

## 2020-04-01 NOTE — Telephone Encounter (Signed)
   Primary Cardiologist: Sinclair Grooms, MD  Chart reviewed as part of pre-operative protocol coverage. Patient was contacted 04/01/2020 in reference to pre-operative risk assessment for pending surgery as outlined below.  Stephanie Newton was last seen on 01/21/20 by Dr.Smith.  Since that day, Stephanie Newton has done well. No cardiac issue.   Therefore, based on ACC/AHA guidelines, the patient would be at acceptable risk for the planned procedure without further cardiovascular testing.   Hx of Ramus intermedius saccular aneurysm, between 1-1.5 cm in diameter 11/2018. Dr. Tamala Julian, can patient hold ASA for 7 days prior? Please forward your response to [facility] PREOP.   Thank you  Leanor Kail, PA 04/01/2020, 10:34 AM

## 2020-04-01 NOTE — Telephone Encounter (Signed)
Okay to hold aspirin for 7 days.

## 2020-04-14 DIAGNOSIS — M25551 Pain in right hip: Secondary | ICD-10-CM | POA: Diagnosis not present

## 2020-04-18 NOTE — Patient Instructions (Addendum)
DUE TO COVID-19 ONLY ONE VISITOR IS ALLOWED TO COME WITH YOU AND STAY IN THE WAITING ROOM ONLY DURING PRE OP AND PROCEDURE DAY OF SURGERY. THE 1 VISITOR MAY VISIT WITH YOU AFTER SURGERY IN YOUR PRIVATE ROOM DURING VISITING HOURS ONLY!  YOU NEED TO HAVE A COVID 19 TEST ON: 04/26/20 @ 11:00 am , THIS TEST MUST BE DONE BEFORE SURGERY, COME  Stephanie Newton, Stephanie Newton , [id number].  (Stephanie Newton) ONCE YOUR COVID TEST IS COMPLETED, PLEASE BEGIN THE QUARANTINE INSTRUCTIONS AS OUTLINED IN YOUR HANDOUT.                Stephanie Newton   Your procedure is scheduled on: 04/30/20   Report to Stephanie Newton Main  Entrance   Report to admitting at: 12:00 PM     Call this number if you have problems the morning of surgery [phone]    Remember:   NO SOLID FOOD AFTER MIDNIGHT THE NIGHT PRIOR TO SURGERY. NOTHING BY MOUTH EXCEPT CLEAR LIQUIDS UNTIL: 11:30 am . PLEASE FINISH ENSURE DRINK PER SURGEON ORDER  WHICH NEEDS TO BE COMPLETED AT: 11:30 am .   CLEAR LIQUID DIET   Foods Allowed                                                                     Foods Excluded  Coffee and tea, regular and decaf                             liquids that you cannot  Plain Jell-O any favor except red or purple                                           see through such as: Fruit ices (not with fruit pulp)                                     milk, soups, orange juice  Iced Popsicles                                    All solid food Carbonated beverages, regular and diet                                    Cranberry, grape and apple juices Sports drinks like Gatorade Lightly seasoned clear broth or consume(fat free) Sugar, honey syrup  Sample Menu Breakfast                                Lunch                                     Supper Cranberry juice  Beef broth                            Chicken broth Jell-O                                     Grape juice                            Apple juice Coffee or tea                        Jell-O                                      Popsicle                                                Coffee or tea                        Coffee or tea  _____________________________________________________________________  BRUSH YOUR TEETH MORNING OF SURGERY AND RINSE YOUR MOUTH OUT, NO CHEWING GUM CANDY OR MINTS.   You may not have any metal on your body including hair pins and              piercings  Do not wear jewelry, make-up, lotions, powders or perfumes, deodorant             Do not wear nail polish on your fingernails.  Do not shave  48 hours prior to surgery.               Do not bring valuables to the [place]. [place].  Contacts, dentures or bridgework may not be worn into surgery.  Leave suitcase in the car. After surgery it may be brought to your room.     Patients discharged the day of surgery will not be allowed to drive home. IF YOU ARE HAVING SURGERY AND GOING HOME THE SAME DAY, YOU MUST HAVE AN ADULT TO DRIVE YOU HOME AND BE WITH YOU FOR 24 HOURS. YOU MAY GO HOME BY TAXI OR UBER OR ORTHERWISE, BUT AN ADULT MUST ACCOMPANY YOU HOME AND STAY WITH YOU FOR 24 HOURS.  Name and phone number of your driver:  Special Instructions: N/A              Please read over the following fact sheets you were given: _____________________________________________________________________             Stephanie Newton ([facility]) - Preparing for Surgery Before surgery, you can play an important role.  Because skin is not sterile, your skin needs to be as free of germs as possible.  You can reduce the number of germs on your skin by washing with CHG (chlorahexidine gluconate) soap before surgery.  CHG is an antiseptic cleaner which kills germs and bonds with the skin to continue killing germs even after washing. Please DO NOT use if you have an allergy to CHG or antibacterial soaps.  If your skin  becomes reddened/irritated stop using the CHG and inform your Stephanie when you arrive at Short Stay. Do not shave (including legs and underarms) for at least 48 hours prior to the first CHG shower.  You may shave your face/neck. Please follow these instructions carefully:  1.  Shower with CHG Soap the night before surgery and the  morning of Surgery.  2.  If you choose to wash your hair, wash your hair first as usual with your  normal  shampoo.  3.  After you shampoo, rinse your hair and body thoroughly to remove the  shampoo.                           4.  Use CHG as you would any other liquid soap.  You can apply chg directly  to the skin and wash                       Gently with a scrungie or clean washcloth.  5.  Apply the CHG Soap to your body ONLY FROM THE NECK DOWN.   Do not use on face/ open                           Wound or open sores. Avoid contact with eyes, ears mouth and genitals (private parts).                       Wash face,  Genitals (private parts) with your normal soap.             6.  Wash thoroughly, paying special attention to the area where your surgery  will be performed.  7.  Thoroughly rinse your body with warm water from the neck down.  8.  DO NOT shower/wash with your normal soap after using and rinsing off  the CHG Soap.                9.  Pat yourself dry with a clean towel.            10.  Wear clean pajamas.            11.  Place clean sheets on your bed the night of your first shower and do not  sleep with pets. Day of Surgery : Do not apply any lotions/deodorants the morning of surgery.  Please wear clean clothes to the Newton/surgery Newton.  FAILURE TO FOLLOW THESE INSTRUCTIONS MAY RESULT IN THE CANCELLATION OF YOUR SURGERY PATIENT SIGNATURE_________________________________  Stephanie SIGNATURE__________________________________  ________________________________________________________________________   Stephanie Newton  An incentive spirometer is a  tool that can help keep your lungs clear and active. This tool measures how well you are filling your lungs with each breath. Taking long deep breaths may help reverse or decrease the chance of developing breathing (pulmonary) problems (especially infection) following:  A long period of time when you are unable to move or be active. BEFORE THE PROCEDURE   If the spirometer includes an indicator to show your best effort, your Stephanie or respiratory therapist will set it to a desired goal.  If possible, sit up straight or lean slightly forward. Try not to slouch.  Hold the incentive spirometer in an upright position. INSTRUCTIONS FOR USE  1. Sit on the edge of your bed if possible, or sit up as far as you can in bed or on  a chair. 2. Hold the incentive spirometer in an upright position. 3. Breathe out normally. 4. Place the mouthpiece in your mouth and seal your lips tightly around it. 5. Breathe in slowly and as deeply as possible, raising the piston or the ball toward the top of the column. 6. Hold your breath for 3-5 seconds or for as long as possible. Allow the piston or ball to fall to the bottom of the column. 7. Remove the mouthpiece from your mouth and breathe out normally. 8. Rest for a few seconds and repeat Steps 1 through 7 at least 10 times every 1-2 hours when you are awake. Take your time and take a few normal breaths between deep breaths. 9. The spirometer may include an indicator to show your best effort. Use the indicator as a goal to work toward during each repetition. 10. After each set of 10 deep breaths, practice coughing to be sure your lungs are clear. If you have an incision (the cut made at the time of surgery), support your incision when coughing by placing a pillow or rolled up towels firmly against it. Once you are able to get out of bed, walk around indoors and cough well. You may stop using the incentive spirometer when instructed by your caregiver.  RISKS AND  COMPLICATIONS  Take your time so you do not get dizzy or light-headed.  If you are in pain, you may need to take or ask for pain medication before doing incentive spirometry. It is harder to take a deep breath if you are having pain. AFTER USE  Rest and breathe slowly and easily.  It can be helpful to keep track of a log of your progress. Your caregiver can provide you with a simple table to help with this. If you are using the spirometer at home, follow these instructions: [place] IF:   You are having difficultly using the spirometer.  You have trouble using the spirometer as often as instructed.  Your pain medication is not giving enough relief while using the spirometer.  You develop fever of 100.5 F (38.1 C) or higher. SEEK IMMEDIATE MEDICAL CARE IF:   You cough up bloody sputum that had not been present before.  You develop fever of 102 F (38.9 C) or greater.  You develop worsening pain at or near the incision site. MAKE SURE YOU:   Understand these instructions.  Will watch your condition.  Will get help right away if you are not doing well or get worse. Document Released: 04/04/2007 Document Revised: 02/14/2012 Document Reviewed: 06/05/2007 [facility] Patient Information 2014 [place], [place].   ________________________________________________________________________

## 2020-04-21 ENCOUNTER — Encounter (HOSPITAL_COMMUNITY)
Admission: RE | Admit: 2020-04-21 | Discharge: 2020-04-21 | Disposition: A | Discharge status: Home / Self Care | Payer: Medicare Other | Source: Ambulatory Visit | Attending: Orthopedic Surgery | Admitting: Orthopedic Surgery

## 2020-04-21 ENCOUNTER — Other Ambulatory Visit: Payer: Self-pay

## 2020-04-21 ENCOUNTER — Encounter (HOSPITAL_COMMUNITY): Payer: Self-pay

## 2020-04-21 DIAGNOSIS — Z01812 Encounter for preprocedural laboratory examination: Secondary | ICD-10-CM | POA: Diagnosis not present

## 2020-04-21 NOTE — Progress Notes (Addendum)
PCP - Dr. Lajean Manes. LOV: 12/12/19.: Clearance:03/31/20: chart Cardiologist - Dr. Daneen Schick. LOV: 01/21/20  Chest x-ray -  EKG - 08/01/19 Stress Test -  ECHO -  Cardiac Cath -  CT angio: 1/20 Sleep Study -  CPAP -   Fasting Blood Sugar -  Checks Blood Sugar _____ times a day  Blood Thinner Instructions: Aspirin Instructions:stop a week before surgery by surgeon instructions. Last Dose:  Anesthesia review: Hx. HTN,NSTEMI,PSVT  Patient denies shortness of breath, fever, cough and chest pain at PAT appointment   Patient verbalized understanding of instructions that were given to them at the PAT appointment. Patient was also instructed that they will need to review over the PAT instructions again at home before surgery.

## 2020-04-22 ENCOUNTER — Encounter (HOSPITAL_COMMUNITY)
Admission: RE | Admit: 2020-04-22 | Discharge: 2020-04-22 | Disposition: A | Discharge status: Home / Self Care | Payer: Medicare Other | Source: Ambulatory Visit | Attending: Orthopedic Surgery | Admitting: Orthopedic Surgery

## 2020-04-22 DIAGNOSIS — Z01812 Encounter for preprocedural laboratory examination: Secondary | ICD-10-CM | POA: Diagnosis not present

## 2020-04-22 LAB — COMPREHENSIVE METABOLIC PANEL
ALT: 38 U/L (ref 0–44)
AST: 38 U/L (ref 15–41)
Albumin: 3.7 g/dL (ref 3.5–5.0)
Alkaline Phosphatase: 78 U/L (ref 38–126)
Anion gap: 11 (ref 5–15)
BUN: 15 mg/dL (ref 8–23)
CO2: 25 mmol/L (ref 22–32)
Calcium: 9.2 mg/dL (ref 8.9–10.3)
Chloride: 101 mmol/L (ref 98–111)
Creatinine, Ser: 0.81 mg/dL (ref 0.44–1.00)
GFR calc Af Amer: >60 mL/min (ref >60)
GFR calc non Af Amer: >60 mL/min (ref >60)
Glucose, Bld: 99 mg/dL (ref 70–99)
Potassium: 3.8 mmol/L (ref 3.5–5.1)
Sodium: 137 mmol/L (ref 135–145)
Total Bilirubin: 0.4 mg/dL (ref 0.3–1.2)
Total Protein: 6.7 g/dL (ref 6.5–8.1)

## 2020-04-22 LAB — CBC
HCT: 45.1 % (ref 36.0–46.0)
Hemoglobin: 14.7 g/dL (ref 12.0–15.0)
MCH: 30.1 pg (ref 26.0–34.0)
MCHC: 32.6 g/dL (ref 30.0–36.0)
MCV: 92.4 fL (ref 80.0–100.0)
Platelets: 267 10*3/uL (ref 150–400)
RBC: 4.88 MIL/uL (ref 3.87–5.11)
RDW: 13.5 % (ref 11.5–15.5)
WBC: 5 10*3/uL (ref 4.0–10.5)
nRBC: 0 % (ref 0.0–0.2)

## 2020-04-22 LAB — PROTIME-INR
INR: 1 (ref 0.8–1.2)
Prothrombin Time: 12.7 seconds (ref 11.4–15.2)

## 2020-04-22 LAB — SURGICAL PCR SCREEN
MRSA, PCR: NEGATIVE
Staphylococcus aureus: NEGATIVE

## 2020-04-22 LAB — APTT: aPTT: 32 seconds (ref 24–36)

## 2020-04-23 NOTE — Progress Notes (Signed)
Anesthesia Chart Review   Case: [id number] Date/Time: 04/30/20 1410   Procedure: RIGHT TOTAL KNEE ARTHROPLASTY (Right Knee) - 26min   Anesthesia type: Choice   Pre-op diagnosis: RIGHT KNEE OSTEOARTHRITIS   Location: [place] [facility] / [facility]   Surgeons: Gaynelle Arabian, MD      DISCUSSION:78 y.o. current some day smoker with h/o HTN, NSTEMI, PSVT, asymptomatic LM saccular aneurysm, right knee OA scheduled for above procedure 04/30/2020 with Dr. Gaynelle Arabian.   Per cardiology pre-op risk assessment 04/01/2020, "Chart reviewed as part of pre-operative protocol coverage. Patient was contacted 04/01/2020 in reference to pre-operative risk assessment for pending surgery as outlined below.  Stephanie Newton was last seen on 01/21/20 by Dr.Smith.  Since that day, Stephanie Newton has done well. No cardiac issue.  Therefore, based on ACC/AHA guidelines, the patient would be at acceptable risk for the planned procedure without further cardiovascular testing."  Ok to hold ASA 7 days prior to procedure per Dr. Mallie Mussel.   Anticipate pt can proceed with planned procedure barring acute status change.   VS: BP 120/62   Pulse 71   Temp 36.8 C (Oral)   Resp 18   Ht 5\' 5"  (1.651 m)   Wt 74.2 kg   SpO2 100%   BMI 27.21 kg/m   PROVIDERS: Lajean Manes, MD is PCP   Daneen Schick, MD is Cardiologist last seen 01/21/2020 LABS: Labs reviewed: Acceptable for surgery. (all labs ordered are listed, but only abnormal results are displayed)  Labs Reviewed  SURGICAL PCR SCREEN  APTT  CBC  COMPREHENSIVE METABOLIC PANEL  PROTIME-INR  TYPE AND SCREEN     IMAGES:   EKG: 08/01/2019 Rate 63 bpm  Normal sinus rhythm  Cannot rule out Anterior infarct, age undetermined   CV: Cardiac Cath 12/04/2018  Ramus intermedius saccular aneurysm, between 1-1.5 cm in diameter.  This would be classified as a giant aneurysm.  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  Left coronary injection leads to shunting of sufficient volume to opacify the left ventricle.  Normal left main.  Normal LAD.  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  Normal left ventricular systolic function with LVEF 70%.  Further troponin likely related to supply demand mismatch in the setting of above anatomy and tachycardia related to arrhythmia.  RECOMMENDATIONS:   Aggressive blood pressure control which should include beta-blocker therapy.  Therapy of hyperlipidemia if present.  Treatment of aneurysm will be considered.  Management will likely be surgical as stenting and coiling in this proximal segment with a large neck would present significant technical problems.  Needs coronary CT angios performed to accurately size the aneurysm and can then be used to follow over time for evidence of growth.   Echo 12/04/2018 Study Conclusions   - Left ventricle: The cavity size was normal. Wall thickness was  increased in a pattern of mild LVH. Systolic function was  vigorous. The estimated ejection fraction was in the range of 65%  to 70%. Wall motion was normal; there were no regional wall  motion abnormalities. Doppler parameters are consistent with  abnormal left ventricular relaxation (grade 1 diastolic  dysfunction).  Past Medical History:  Diagnosis Date  . Hypercholesteremia   . Hypertension     Past Surgical History:  Procedure Laterality Date  . BREAST BIOPSY     bilateral   . EYE SURGERY     cataract bilateral   . HERNIA REPAIR  06/2000  INCISIONAL VENTRAL ; DR Nedra Hai   . KNEE ARTHROSCOPY Left 08/23/2017   dr Theda Sers   . LAPAROSCOPIC CHOLECYSTECTOMY  06/13/2000   DR Nedra Hai   . LEFT HEART CATH AND CORONARY ANGIOGRAPHY N/A 12/04/2018   Procedure: LEFT HEART CATH AND CORONARY ANGIOGRAPHY;  Surgeon: Belva Crome, MD;  Location: [place] [facility];  Service: Cardiovascular;  Laterality: N/A;  . TOTAL KNEE  ARTHROPLASTY Left 03/31/2018   Procedure: LEFT TOTAL KNEE ARTHROPLASTY;  Surgeon: Sydnee Cabal, MD;  Location: [facility];  Service: Orthopedics;  Laterality: Left;  with block  . VAGINAL HYSTERECTOMY  age 77s    MEDICATIONS: . aspirin EC 81 MG EC tablet  . atenolol (TENORMIN) 100 MG tablet  . atorvastatin (LIPITOR) 40 MG tablet  . estradiol (ESTRACE) 1 MG tablet  . hydrochlorothiazide (HYDRODIURIL) 12.5 MG tablet  . lisinopril (ZESTRIL) 10 MG tablet   No current facility-administered medications for this encounter.     Maia Plan [facility] Pre-Surgical Testing [phone] 04/23/20  3:45 PM

## 2020-04-23 NOTE — Anesthesia Preprocedure Evaluation (Addendum)
Anesthesia Evaluation  Patient identified by MRN, date of birth, ID band Patient awake    Reviewed: Allergy & Precautions, NPO status , Patient's Chart, lab work & pertinent test results  Airway Mallampati: II  TM Distance: >3 FB Neck ROM: Full    Dental  (+) Teeth Intact, Dental Advisory Given   Pulmonary Patient abstained from smoking., former smoker,    Pulmonary exam normal breath sounds clear to auscultation       Cardiovascular hypertension, Pt. on home beta blockers and Pt. on medications + CAD and + Past MI  Normal cardiovascular exam Rhythm:Regular Rate:Normal  h/o HTN, NSTEMI, PSVT, asymptomatic LM saccular aneurysm  Echo 12/04/2018 Study Conclusions   - Left ventricle: The cavity size was normal. Wall thickness was  increased in a pattern of mild LVH. Systolic function was  vigorous. The estimated ejection fraction was in the range of 65%  to 70%. Wall motion was normal; there were no regional wall  motion abnormalities. Doppler parameters are consistent with  abnormal left ventricular relaxation (grade 1 diastolic  dysfunction).    Neuro/Psych negative neurological ROS  negative psych ROS   GI/Hepatic negative GI ROS, Neg liver ROS,   Endo/Other  negative endocrine ROS  Renal/GU negative Renal ROS     Musculoskeletal  (+) Arthritis , Right knee osteoarthritis   Abdominal   Peds  Hematology negative hematology ROS (+) Plt 267k   Anesthesia Other Findings Day of surgery medications reviewed with the patient.  Reproductive/Obstetrics                                                             Anesthesia Evaluation  Patient identified by MRN, date of birth, ID band Patient awake    Reviewed: Allergy & Precautions, NPO status , Patient's Chart, lab work & pertinent test results, reviewed documented beta blocker date and time   Airway Mallampati: II  TM  Distance: >3 FB Neck ROM: Full    Dental  (+) Teeth Intact, Dental Advisory Given   Pulmonary Current Smoker,    Pulmonary exam normal breath sounds clear to auscultation       Cardiovascular hypertension, Pt. on home beta blockers and Pt. on medications Normal cardiovascular exam Rhythm:Regular Rate:Normal     Neuro/Psych negative neurological ROS  negative psych ROS   GI/Hepatic negative GI ROS, Neg liver ROS,   Endo/Other  negative endocrine ROS  Renal/GU negative Renal ROS     Musculoskeletal  (+) Arthritis , Osteoarthritis,  Left knee osteoarthritis   Abdominal   Peds  Hematology negative hematology ROS (+) Plt 216k   Anesthesia Other Findings Day of surgery medications reviewed with the patient.  Reproductive/Obstetrics                             Anesthesia Physical Anesthesia Plan  ASA: II  Anesthesia Plan: Spinal   Post-op Pain Management:  Regional for Post-op pain   Induction: Intravenous  PONV Risk Score and Plan: 1 and Propofol infusion, Dexamethasone and Ondansetron  Airway Management Planned: Simple Face Mask  Additional Equipment:   Intra-op Plan:   Post-operative Plan:   Informed Consent: I have reviewed the patients History and Physical, chart, labs and discussed the procedure including the risks,  benefits and alternatives for the proposed anesthesia with the patient or authorized representative who has indicated his/her understanding and acceptance.   Dental advisory given  Plan Discussed with: CRNA, Anesthesiologist and Surgeon  Anesthesia Plan Comments: (Adductor canal nerve block plus spinal)        Anesthesia Quick Evaluation  Anesthesia Physical Anesthesia Plan  ASA: III  Anesthesia Plan: Spinal   Post-op Pain Management:    Induction: Intravenous  PONV Risk Score and Plan: 1 and Propofol infusion and Treatment may vary due to age or medical condition  Airway Management  Planned: Natural Airway and Nasal Cannula  Additional Equipment:   Intra-op Plan:   Post-operative Plan:   Informed Consent: I have reviewed the patients History and Physical, chart, labs and discussed the procedure including the risks, benefits and alternatives for the proposed anesthesia with the patient or authorized representative who has indicated his/her understanding and acceptance.     Dental advisory given  Plan Discussed with: CRNA  Anesthesia Plan Comments: (See PAT note 04/22/2020, Konrad Felix, PA-C)       Anesthesia Quick Evaluation

## 2020-04-25 DIAGNOSIS — I10 Essential (primary) hypertension: Secondary | ICD-10-CM | POA: Diagnosis not present

## 2020-04-25 DIAGNOSIS — M1712 Unilateral primary osteoarthritis, left knee: Secondary | ICD-10-CM | POA: Diagnosis not present

## 2020-04-25 DIAGNOSIS — E7800 Pure hypercholesterolemia, unspecified: Secondary | ICD-10-CM | POA: Diagnosis not present

## 2020-04-26 ENCOUNTER — Other Ambulatory Visit (HOSPITAL_COMMUNITY)
Admission: RE | Admit: 2020-04-26 | Discharge: 2020-04-26 | Disposition: A | Discharge status: Home / Self Care | Payer: Medicare Other | Source: Ambulatory Visit | Attending: Orthopedic Surgery | Admitting: Orthopedic Surgery

## 2020-04-26 DIAGNOSIS — Z20822 Contact with and (suspected) exposure to covid-19: Secondary | ICD-10-CM | POA: Diagnosis not present

## 2020-04-26 DIAGNOSIS — Z01812 Encounter for preprocedural laboratory examination: Secondary | ICD-10-CM | POA: Insufficient documentation

## 2020-04-26 LAB — SARS CORONAVIRUS 2 (TAT 6-24 HRS): SARS Coronavirus 2: NEGATIVE

## 2020-04-28 NOTE — H&P (Signed)
TOTAL KNEE ADMISSION H&P  Patient is being admitted for right total knee arthroplasty.  Subjective:  Chief Complaint: Right knee pain.  HPI: Stephanie Newton, 79 y.o. female has a history of pain and functional disability in the right knee due to arthritis and has failed non-surgical conservative treatments for greater than 12 weeks to include corticosteriod injections and activity modification. Onset of symptoms was gradual, starting several years ago with gradually worsening course since that time. The patient noted no past surgery on the right knee.  Patient currently rates pain in the right knee at 8 out of 10 with activity. Patient has night pain, worsening of pain with activity and weight bearing and crepitus. Patient has evidence of bone-on-bone OA in the medial compartment with narrowing behind the patella with marginal osteophyte formation by imaging studies. There is no active infection.  Patient Active Problem List   Diagnosis Date Noted  . Coronary artery aneurysm 12/05/2018  . Benign essential HTN 12/05/2018  . HLD (hyperlipidemia) 12/05/2018  . Lung nodules 12/05/2018  . Nicotine abuse 12/05/2018  . Elevated troponin   . Palpitations 12/03/2018  . NSTEMI (non-ST elevated myocardial infarction) ([place]) 12/03/2018  . S/P knee replacement 03/31/2018  . Primary osteoarthritis of knee 03/06/2018    Past Medical History:  Diagnosis Date  . Hypercholesteremia   . Hypertension     Past Surgical History:  Procedure Laterality Date  . BREAST BIOPSY     bilateral   . EYE SURGERY     cataract bilateral   . HERNIA REPAIR  06/2000   INCISIONAL VENTRAL ; DR Nedra Hai   . KNEE ARTHROSCOPY Left 08/23/2017   dr Theda Sers   . LAPAROSCOPIC CHOLECYSTECTOMY  06/13/2000   DR Nedra Hai   . LEFT HEART CATH AND CORONARY ANGIOGRAPHY N/A 12/04/2018   Procedure: LEFT HEART CATH AND CORONARY ANGIOGRAPHY;  Surgeon: Belva Crome, MD;  Location: [place] [facility];  Service:  Cardiovascular;  Laterality: N/A;  . TOTAL KNEE ARTHROPLASTY Left 03/31/2018   Procedure: LEFT TOTAL KNEE ARTHROPLASTY;  Surgeon: Sydnee Cabal, MD;  Location: [facility];  Service: Orthopedics;  Laterality: Left;  with block  . VAGINAL HYSTERECTOMY  age 16s    Prior to Admission medications   Medication Sig Start Date End Date Taking? Authorizing Provider  aspirin EC 81 MG EC tablet Take 1 tablet (81 mg total) by mouth daily. 12/06/18  Yes Debbe Odea, MD  atenolol (TENORMIN) 100 MG tablet Take 100 mg by mouth daily.   Yes [provider]  atorvastatin (LIPITOR) 40 MG tablet Take 40 mg by mouth daily.   Yes [provider]  estradiol (ESTRACE) 1 MG tablet Take 1 mg by mouth daily.   Yes [provider]  hydrochlorothiazide (HYDRODIURIL) 12.5 MG tablet Take 12.5 mg by mouth daily.    Yes [provider]  lisinopril (ZESTRIL) 10 MG tablet Take 10 mg by mouth daily.   Yes [provider]    Allergies  Allergen Reactions  . Shellfish Allergy Nausea Only    Social History   Socioeconomic History  . Marital status: Married    Spouse name: Not on file  . Number of children: Not on file  . Years of education: Not on file  . Highest education level: Not on file  Occupational History  . Not on file  Tobacco Use  . Smoking status: Current Some Day Smoker    Years: 30.00    Types: Cigarettes  . Smokeless tobacco:  Never Used  . Tobacco comment: 6 cigarettes a day  Substance and Sexual Activity  . Alcohol use: Yes    Comment: occasionally   . Drug use: No  . Sexual activity: Yes  Other Topics Concern  . Not on file  Social History Narrative  . Not on file   Social Determinants of Health   Financial Resource Strain:   . Difficulty of Paying Living Expenses:   Food Insecurity:   . Worried About Charity fundraiser in the Last Year:   . Arboriculturist in the Last Year:   Transportation Needs:   . Film/video editor (Medical):   Marland Kitchen  Lack of Transportation (Non-Medical):   Physical Activity:   . Days of Exercise per Week:   . Minutes of Exercise per Session:   Stress:   . Feeling of Stress :   Social Connections:   . Frequency of Communication with Friends and Family:   . Frequency of Social Gatherings with Friends and Family:   . Attends Religious Services:   . Active Member of Clubs or Organizations:   . Attends Archivist Meetings:   Marland Kitchen Marital Status:   Intimate Partner Violence:   . Fear of Current or Ex-Partner:   . Emotionally Abused:   Marland Kitchen Physically Abused:   . Sexually Abused:       Tobacco Use: High Risk  . Smoking Tobacco Use: Current Some Day Smoker  . Smokeless Tobacco Use: Never Used   Social History   Substance and Sexual Activity  Alcohol Use Yes   Comment: occasionally     No family history on file.  Review of Systems  Constitutional: Negative for chills and fever.  HENT: Negative for congestion, sore throat and tinnitus.   Eyes: Negative for double vision, photophobia and pain.  Respiratory: Negative for cough, shortness of breath and wheezing.   Cardiovascular: Negative for chest pain, palpitations and orthopnea.  Gastrointestinal: Negative for heartburn, nausea and vomiting.  Genitourinary: Negative for dysuria, frequency and urgency.  Musculoskeletal: Positive for joint pain.  Neurological: Negative for dizziness, weakness and headaches.    Objective:  Physical Exam: Well nourished and well developed.  General: Alert and oriented x3, cooperative and pleasant, no acute distress.  Head: normocephalic, atraumatic, neck supple.  Eyes: EOMI.  Respiratory: breath sounds clear in all fields, no wheezing, rales, or rhonchi. Cardiovascular: Regular rate and rhythm, no murmurs, gallops or rubs.  Abdomen: non-tender to palpation and soft, normoactive bowel sounds. Musculoskeletal:  Right knee exam: No swelling. No effusion. Tenderness medially and laterally. Range of  motion is 5 to 125 degrees.  Moderate crepitus with range of motion. Knee is stable.  Calves soft and nontender. Motor function intact in LE. Strength 5/5 LE bilaterally. Neuro: Distal pulses 2+. Sensation to light touch intact in LE.  Imaging Review Plain radiographs demonstrate severe degenerative joint disease of the right knee. The overall alignment is neutral. The bone quality appears to be adequate for age and reported activity level.  Assessment/Plan:  End stage arthritis, right knee   The patient history, physical examination, clinical judgment of the provider and imaging studies are consistent with end stage degenerative joint disease of the right knee and total knee arthroplasty is deemed medically necessary. The treatment options including medical management, injection therapy arthroscopy and arthroplasty were discussed at length. The risks and benefits of total knee arthroplasty were presented and reviewed. The risks due to aseptic loosening, infection, stiffness, patella tracking problems,  thromboembolic complications and other imponderables were discussed. The patient acknowledged the explanation, agreed to proceed with the plan and consent was signed. Patient is being admitted for inpatient treatment for surgery, pain control, PT, OT, prophylactic antibiotics, VTE prophylaxis, progressive ambulation and ADLs and discharge planning. The patient is planning to be discharged home.   Patient's anticipated LOS is less than 2 midnights, meeting these requirements: - Younger than 38 - Lives within 1 hour of care - Has a competent adult at home to recover with post-op recover - NO history of  - Chronic pain requiring opiods  - Diabetes  - Coronary Artery Disease  - Heart failure  - Heart attack  - Stroke  - DVT/VTE  - Cardiac arrhythmia  - Respiratory Failure/COPD  - Renal failure  - Anemia  - Advanced Liver disease  Therapy Plans: Outpatient therapy at  [facility] Disposition: Home with husband and son Planned DVT Prophylaxis: Aspirin 325 mg BID DME Needed: None PCP: Lajean Manes, MD Cardiologist: Daneen Schick, MD TXA: IV Allergies: NKDA Anesthesia Concerns: None BMI: 25.6  - Patient was instructed on what medications to stop prior to surgery. - Follow-up visit in 2 weeks with Dr. Wynelle Link - Begin physical therapy following surgery - Pre-operative lab work as pre-surgical testing - Prescriptions will be provided in hospital at time of discharge  Theresa Duty, PA-C Orthopedic Surgery [facility] [place]

## 2020-04-29 MED ORDER — BUPIVACAINE LIPOSOME 1.3 % IJ SUSP
20.0000 mL | Freq: Once | INTRAMUSCULAR | Status: DC
  Filled 2020-04-29: qty 20

## 2020-04-30 ENCOUNTER — Ambulatory Visit (HOSPITAL_COMMUNITY)
Admission: RE | Admit: 2020-04-30 | Discharge: 2020-05-01 | Disposition: A | Discharge status: Home / Self Care | Payer: Medicare Other | Attending: Orthopedic Surgery | Admitting: Orthopedic Surgery

## 2020-04-30 ENCOUNTER — Ambulatory Visit (HOSPITAL_COMMUNITY): Payer: Medicare Other | Admitting: Registered Nurse

## 2020-04-30 ENCOUNTER — Encounter (HOSPITAL_COMMUNITY): Payer: Self-pay | Admitting: Orthopedic Surgery

## 2020-04-30 ENCOUNTER — Encounter (HOSPITAL_COMMUNITY)
Admission: RE | Disposition: A | Discharge status: Home / Self Care | Payer: Self-pay | Source: Home / Self Care | Attending: Orthopedic Surgery

## 2020-04-30 ENCOUNTER — Other Ambulatory Visit: Payer: Self-pay

## 2020-04-30 ENCOUNTER — Telehealth (HOSPITAL_COMMUNITY): Payer: Self-pay | Admitting: *Deleted

## 2020-04-30 ENCOUNTER — Ambulatory Visit (HOSPITAL_COMMUNITY): Payer: Medicare Other | Admitting: Physician Assistant

## 2020-04-30 DIAGNOSIS — F17210 Nicotine dependence, cigarettes, uncomplicated: Secondary | ICD-10-CM | POA: Insufficient documentation

## 2020-04-30 DIAGNOSIS — M1711 Unilateral primary osteoarthritis, right knee: Secondary | ICD-10-CM | POA: Diagnosis not present

## 2020-04-30 DIAGNOSIS — I10 Essential (primary) hypertension: Secondary | ICD-10-CM | POA: Diagnosis not present

## 2020-04-30 DIAGNOSIS — Z79899 Other long term (current) drug therapy: Secondary | ICD-10-CM | POA: Insufficient documentation

## 2020-04-30 DIAGNOSIS — I252 Old myocardial infarction: Secondary | ICD-10-CM | POA: Diagnosis not present

## 2020-04-30 DIAGNOSIS — Z96652 Presence of left artificial knee joint: Secondary | ICD-10-CM | POA: Diagnosis not present

## 2020-04-30 DIAGNOSIS — G8918 Other acute postprocedural pain: Secondary | ICD-10-CM | POA: Diagnosis not present

## 2020-04-30 DIAGNOSIS — Z7982 Long term (current) use of aspirin: Secondary | ICD-10-CM | POA: Insufficient documentation

## 2020-04-30 DIAGNOSIS — M1710 Unilateral primary osteoarthritis, unspecified knee: Secondary | ICD-10-CM

## 2020-04-30 DIAGNOSIS — E785 Hyperlipidemia, unspecified: Secondary | ICD-10-CM | POA: Diagnosis not present

## 2020-04-30 HISTORY — PX: TOTAL KNEE ARTHROPLASTY: SHX125

## 2020-04-30 LAB — TYPE AND SCREEN
ABO/RH(D): O POS
Antibody Screen: NEGATIVE

## 2020-04-30 SURGERY — ARTHROPLASTY, KNEE, TOTAL
Anesthesia: Spinal | Site: Knee | Laterality: Right

## 2020-04-30 MED ORDER — PHENYLEPHRINE HCL-NACL 20-0.9 MG/250ML-% IV SOLN
INTRAVENOUS | Status: DC | PRN
  Administered 2020-04-30: 20 ug/min via INTRAVENOUS

## 2020-04-30 MED ORDER — LACTATED RINGERS IV SOLN: INTRAVENOUS | Status: DC

## 2020-04-30 MED ORDER — ASPIRIN EC 325 MG PO TBEC
325.0000 mg | DELAYED_RELEASE_TABLET | Freq: Two times a day (BID) | ORAL | Status: DC
  Administered 2020-05-01: 325 mg via ORAL
  Filled 2020-04-30: qty 1

## 2020-04-30 MED ORDER — MENTHOL 3 MG MT LOZG: 1.0000 | LOZENGE | OROMUCOSAL | Status: DC | PRN

## 2020-04-30 MED ORDER — PHENOL 1.4 % MT LIQD: 1.0000 | OROMUCOSAL | Status: DC | PRN

## 2020-04-30 MED ORDER — DEXAMETHASONE SODIUM PHOSPHATE 10 MG/ML IJ SOLN
INTRAMUSCULAR | Status: AC
  Filled 2020-04-30: qty 1

## 2020-04-30 MED ORDER — FENTANYL CITRATE (PF) 100 MCG/2ML IJ SOLN: 25.0000 ug | INTRAMUSCULAR | Status: DC | PRN

## 2020-04-30 MED ORDER — ATORVASTATIN CALCIUM 40 MG PO TABS
40.0000 mg | ORAL_TABLET | Freq: Every day | ORAL | Status: DC
  Administered 2020-05-01: 40 mg via ORAL
  Filled 2020-04-30: qty 1

## 2020-04-30 MED ORDER — FENTANYL CITRATE (PF) 100 MCG/2ML IJ SOLN
50.0000 ug | INTRAMUSCULAR | Status: DC
  Administered 2020-04-30: 50 ug via INTRAVENOUS
  Filled 2020-04-30: qty 2

## 2020-04-30 MED ORDER — ONDANSETRON HCL 4 MG/2ML IJ SOLN: 4.0000 mg | Freq: Four times a day (QID) | INTRAMUSCULAR | Status: DC | PRN

## 2020-04-30 MED ORDER — TRAMADOL HCL 50 MG PO TABS
50.0000 mg | ORAL_TABLET | Freq: Four times a day (QID) | ORAL | Status: DC | PRN
  Administered 2020-04-30: 50 mg via ORAL
  Filled 2020-04-30: qty 1

## 2020-04-30 MED ORDER — PROPOFOL 500 MG/50ML IV EMUL
INTRAVENOUS | Status: DC | PRN
  Administered 2020-04-30: 40 ug/kg/min via INTRAVENOUS

## 2020-04-30 MED ORDER — CHLORHEXIDINE GLUCONATE 0.12 % MT SOLN
15.0000 mL | Freq: Once | OROMUCOSAL | Status: AC
  Administered 2020-04-30: 15 mL via OROMUCOSAL

## 2020-04-30 MED ORDER — DIPHENHYDRAMINE HCL 12.5 MG/5ML PO ELIX: 12.5000 mg | ORAL_SOLUTION | ORAL | Status: DC | PRN

## 2020-04-30 MED ORDER — DOCUSATE SODIUM 100 MG PO CAPS
100.0000 mg | ORAL_CAPSULE | Freq: Two times a day (BID) | ORAL | Status: DC
  Administered 2020-04-30 – 2020-05-01 (×2): 100 mg via ORAL
  Filled 2020-04-30 (×2): qty 1

## 2020-04-30 MED ORDER — TRANEXAMIC ACID-NACL 1000-0.7 MG/100ML-% IV SOLN
1000.0000 mg | INTRAVENOUS | Status: AC
  Administered 2020-04-30: 1000 mg via INTRAVENOUS
  Filled 2020-04-30: qty 100

## 2020-04-30 MED ORDER — ONDANSETRON HCL 4 MG/2ML IJ SOLN: 4.0000 mg | Freq: Once | INTRAMUSCULAR | Status: DC | PRN

## 2020-04-30 MED ORDER — SODIUM CHLORIDE 0.9 % IV SOLN: INTRAVENOUS | Status: DC

## 2020-04-30 MED ORDER — ALBUMIN HUMAN 5 % IV SOLN: 12.5000 g | Freq: Once | INTRAVENOUS | Status: AC

## 2020-04-30 MED ORDER — ROPIVACAINE HCL 7.5 MG/ML IJ SOLN
INTRAMUSCULAR | Status: DC | PRN
Start: 2020-04-30 — End: 2020-04-30
  Administered 2020-04-30: 20 mL via PERINEURAL

## 2020-04-30 MED ORDER — PROPOFOL 10 MG/ML IV BOLUS
INTRAVENOUS | Status: AC
  Filled 2020-04-30: qty 20

## 2020-04-30 MED ORDER — DEXAMETHASONE SODIUM PHOSPHATE 10 MG/ML IJ SOLN
10.0000 mg | Freq: Once | INTRAMUSCULAR | Status: AC
  Administered 2020-05-01: 10 mg via INTRAVENOUS
  Filled 2020-04-30: qty 1

## 2020-04-30 MED ORDER — POLYETHYLENE GLYCOL 3350 17 G PO PACK: 17.0000 g | PACK | Freq: Every day | ORAL | Status: DC | PRN

## 2020-04-30 MED ORDER — METHOCARBAMOL 500 MG IVPB - SIMPLE MED
500.0000 mg | Freq: Four times a day (QID) | INTRAVENOUS | Status: DC | PRN
  Filled 2020-04-30: qty 50

## 2020-04-30 MED ORDER — MORPHINE SULFATE (PF) 2 MG/ML IV SOLN: 0.5000 mg | INTRAVENOUS | Status: DC | PRN

## 2020-04-30 MED ORDER — METOCLOPRAMIDE HCL 5 MG/ML IJ SOLN: 5.0000 mg | Freq: Three times a day (TID) | INTRAMUSCULAR | Status: DC | PRN

## 2020-04-30 MED ORDER — HYDROCHLOROTHIAZIDE 25 MG PO TABS
12.5000 mg | ORAL_TABLET | Freq: Every day | ORAL | Status: DC
  Filled 2020-04-30: qty 1

## 2020-04-30 MED ORDER — ONDANSETRON HCL 4 MG PO TABS: 4.0000 mg | ORAL_TABLET | Freq: Four times a day (QID) | ORAL | Status: DC | PRN

## 2020-04-30 MED ORDER — BUPIVACAINE LIPOSOME 1.3 % IJ SUSP
INTRAMUSCULAR | Status: DC | PRN
  Administered 2020-04-30: 20 mL

## 2020-04-30 MED ORDER — POVIDONE-IODINE 10 % EX SWAB
2.0000 "application " | Freq: Once | CUTANEOUS | Status: AC
  Administered 2020-04-30: 2 via TOPICAL

## 2020-04-30 MED ORDER — MIDAZOLAM HCL 2 MG/2ML IJ SOLN
1.0000 mg | INTRAMUSCULAR | Status: DC
  Administered 2020-04-30: 2 mg via INTRAVENOUS
  Filled 2020-04-30: qty 2

## 2020-04-30 MED ORDER — OXYCODONE HCL 5 MG PO TABS
5.0000 mg | ORAL_TABLET | ORAL | Status: DC | PRN
  Administered 2020-05-01: 5 mg via ORAL
  Filled 2020-04-30: qty 1

## 2020-04-30 MED ORDER — CEFAZOLIN SODIUM-DEXTROSE 2-4 GM/100ML-% IV SOLN
2.0000 g | Freq: Four times a day (QID) | INTRAVENOUS | Status: AC
  Administered 2020-04-30 – 2020-05-01 (×2): 2 g via INTRAVENOUS
  Filled 2020-04-30 (×2): qty 100

## 2020-04-30 MED ORDER — DEXAMETHASONE SODIUM PHOSPHATE 10 MG/ML IJ SOLN
8.0000 mg | Freq: Once | INTRAMUSCULAR | Status: AC
  Administered 2020-04-30: 8 mg via INTRAVENOUS

## 2020-04-30 MED ORDER — STERILE WATER FOR IRRIGATION IR SOLN
Status: DC | PRN
  Administered 2020-04-30: 2000 mL

## 2020-04-30 MED ORDER — CEFAZOLIN SODIUM-DEXTROSE 2-4 GM/100ML-% IV SOLN
2.0000 g | INTRAVENOUS | Status: AC
  Administered 2020-04-30: 2 g via INTRAVENOUS
  Filled 2020-04-30: qty 100

## 2020-04-30 MED ORDER — FLEET ENEMA 7-19 GM/118ML RE ENEM: 1.0000 | ENEMA | Freq: Once | RECTAL | Status: DC | PRN

## 2020-04-30 MED ORDER — SODIUM CHLORIDE (PF) 0.9 % IJ SOLN
INTRAMUSCULAR | Status: DC | PRN
  Administered 2020-04-30: 60 mL

## 2020-04-30 MED ORDER — 0.9 % SODIUM CHLORIDE (POUR BTL) OPTIME
TOPICAL | Status: DC | PRN
  Administered 2020-04-30: 1000 mL

## 2020-04-30 MED ORDER — PROPOFOL 1000 MG/100ML IV EMUL
INTRAVENOUS | Status: AC
  Filled 2020-04-30: qty 100

## 2020-04-30 MED ORDER — ONDANSETRON HCL 4 MG/2ML IJ SOLN
INTRAMUSCULAR | Status: DC | PRN
  Administered 2020-04-30: 4 mg via INTRAVENOUS

## 2020-04-30 MED ORDER — ALBUMIN HUMAN 5 % IV SOLN
INTRAVENOUS | Status: AC
  Administered 2020-04-30: 12.5 g via INTRAVENOUS
  Filled 2020-04-30: qty 250

## 2020-04-30 MED ORDER — PHENYLEPHRINE 40 MCG/ML (10ML) SYRINGE FOR IV PUSH (FOR BLOOD PRESSURE SUPPORT)
PREFILLED_SYRINGE | INTRAVENOUS | Status: DC | PRN
  Administered 2020-04-30 (×5): 80 ug via INTRAVENOUS

## 2020-04-30 MED ORDER — PHENYLEPHRINE HCL (PRESSORS) 10 MG/ML IV SOLN
INTRAVENOUS | Status: AC
  Filled 2020-04-30: qty 2

## 2020-04-30 MED ORDER — ATENOLOL 50 MG PO TABS
100.0000 mg | ORAL_TABLET | Freq: Every day | ORAL | Status: DC
  Filled 2020-04-30: qty 2

## 2020-04-30 MED ORDER — ATENOLOL 100 MG PO TABS
100.0000 mg | ORAL_TABLET | Freq: Once | ORAL | Status: AC
  Administered 2020-04-30: 100 mg via ORAL
  Filled 2020-04-30: qty 1

## 2020-04-30 MED ORDER — METOCLOPRAMIDE HCL 5 MG PO TABS: 5.0000 mg | ORAL_TABLET | Freq: Three times a day (TID) | ORAL | Status: DC | PRN

## 2020-04-30 MED ORDER — METHOCARBAMOL 500 MG PO TABS
500.0000 mg | ORAL_TABLET | Freq: Four times a day (QID) | ORAL | Status: DC | PRN
  Administered 2020-04-30 – 2020-05-01 (×2): 500 mg via ORAL
  Filled 2020-04-30 (×2): qty 1

## 2020-04-30 MED ORDER — CLONIDINE HCL (ANALGESIA) 100 MCG/ML EP SOLN
EPIDURAL | Status: DC | PRN
  Administered 2020-04-30: 50 ug

## 2020-04-30 MED ORDER — BUPIVACAINE IN DEXTROSE 0.75-8.25 % IT SOLN
INTRATHECAL | Status: DC | PRN
Start: 2020-04-30 — End: 2020-04-30
  Administered 2020-04-30: 1.6 mL via INTRATHECAL

## 2020-04-30 MED ORDER — ACETAMINOPHEN 500 MG PO TABS
1000.0000 mg | ORAL_TABLET | Freq: Four times a day (QID) | ORAL | Status: DC
  Administered 2020-04-30 – 2020-05-01 (×3): 1000 mg via ORAL
  Filled 2020-04-30 (×4): qty 2

## 2020-04-30 MED ORDER — BISACODYL 10 MG RE SUPP: 10.0000 mg | Freq: Every day | RECTAL | Status: DC | PRN

## 2020-04-30 MED ORDER — ONDANSETRON HCL 4 MG/2ML IJ SOLN
INTRAMUSCULAR | Status: AC
  Filled 2020-04-30: qty 2

## 2020-04-30 MED ORDER — SODIUM CHLORIDE 0.9 % IR SOLN
Status: DC | PRN
  Administered 2020-04-30: 1000 mL

## 2020-04-30 MED ORDER — ACETAMINOPHEN 10 MG/ML IV SOLN
1000.0000 mg | Freq: Once | INTRAVENOUS | Status: AC
  Administered 2020-04-30: 1000 mg via INTRAVENOUS
  Filled 2020-04-30: qty 100

## 2020-04-30 SURGICAL SUPPLY — 58 items
ATTUNE PSFEM RTSZ5 NARCEM KNEE (Femur) ×3 IMPLANT
ATTUNE PSRP INSR SZ5 8 KNEE (Insert) ×2 IMPLANT
ATTUNE PSRP INSR SZ5 8MM KNEE (Insert) ×1 IMPLANT
BAG ZIPLOCK 12X15 (MISCELLANEOUS) ×3 IMPLANT
BASEPLATE TIBIAL ROTATING SZ 4 (Knees) ×3 IMPLANT
BLADE SAG 18X100X1.27 (BLADE) ×3 IMPLANT
BLADE SAW SGTL 11.0X1.19X90.0M (BLADE) ×3 IMPLANT
BLADE SURG SZ10 CARB STEEL (BLADE) ×6 IMPLANT
BNDG ELASTIC 6X5.8 VLCR STR LF (GAUZE/BANDAGES/DRESSINGS) ×3 IMPLANT
BOWL SMART MIX CTS (DISPOSABLE) ×3 IMPLANT
CEMENT HV SMART SET (Cement) ×6 IMPLANT
CLOSURE STERI-STRIP 1/2X4 (GAUZE/BANDAGES/DRESSINGS) ×2
CLOSURE WOUND 1/2 X4 (GAUZE/BANDAGES/DRESSINGS) ×2
CLSR STERI-STRIP ANTIMIC 1/2X4 (GAUZE/BANDAGES/DRESSINGS) ×4 IMPLANT
COVER SURGICAL LIGHT HANDLE (MISCELLANEOUS) ×3 IMPLANT
COVER WAND RF STERILE (DRAPES) ×3 IMPLANT
CUFF TOURN SGL QUICK 34 (TOURNIQUET CUFF) ×3
CUFF TRNQT CYL 34X4.125X (TOURNIQUET CUFF) ×1 IMPLANT
DECANTER SPIKE VIAL GLASS SM (MISCELLANEOUS) ×3 IMPLANT
DRAPE U-SHAPE 47X51 STRL (DRAPES) ×3 IMPLANT
DRESSING AQUACEL AG SP 3.5X10 (GAUZE/BANDAGES/DRESSINGS) ×1 IMPLANT
DRSG AQUACEL AG ADV 3.5X10 (GAUZE/BANDAGES/DRESSINGS) ×3 IMPLANT
DRSG AQUACEL AG SP 3.5X10 (GAUZE/BANDAGES/DRESSINGS) ×3
DURAPREP 26ML APPLICATOR (WOUND CARE) ×3 IMPLANT
ELECT REM PT RETURN 15FT ADLT (MISCELLANEOUS) ×3 IMPLANT
GLOVE BIO SURGEON STRL SZ7 (GLOVE) ×3 IMPLANT
GLOVE BIO SURGEON STRL SZ8 (GLOVE) ×3 IMPLANT
GLOVE BIOGEL PI IND STRL 7.0 (GLOVE) ×1 IMPLANT
GLOVE BIOGEL PI IND STRL 8 (GLOVE) ×1 IMPLANT
GLOVE BIOGEL PI INDICATOR 7.0 (GLOVE) ×2
GLOVE BIOGEL PI INDICATOR 8 (GLOVE) ×2
GOWN STRL REUS W/TWL LRG LVL3 (GOWN DISPOSABLE) ×6 IMPLANT
HANDPIECE INTERPULSE COAX TIP (DISPOSABLE) ×3
HOLDER FOLEY CATH W/STRAP (MISCELLANEOUS) ×3 IMPLANT
IMMOBILIZER KNEE 20 (SOFTGOODS) ×3
IMMOBILIZER KNEE 20 THIGH 36 (SOFTGOODS) ×1 IMPLANT
KIT TURNOVER KIT A (KITS) IMPLANT
MANIFOLD NEPTUNE II (INSTRUMENTS) ×3 IMPLANT
NS IRRIG 1000ML POUR BTL (IV SOLUTION) ×3 IMPLANT
PACK TOTAL KNEE CUSTOM (KITS) ×3 IMPLANT
PADDING CAST COTTON 6X4 STRL (CAST SUPPLIES) ×6 IMPLANT
PATELLA MEDIAL ATTUN 35MM KNEE (Knees) ×3 IMPLANT
PENCIL SMOKE EVACUATOR (MISCELLANEOUS) ×3 IMPLANT
PIN DRILL FIX HALF THREAD (BIT) ×3 IMPLANT
PIN STEINMAN FIXATION KNEE (PIN) ×3 IMPLANT
PROTECTOR NERVE ULNAR (MISCELLANEOUS) ×3 IMPLANT
SET HNDPC FAN SPRY TIP SCT (DISPOSABLE) ×1 IMPLANT
STRIP CLOSURE SKIN 1/2X4 (GAUZE/BANDAGES/DRESSINGS) ×4 IMPLANT
SUT MNCRL AB 4-0 PS2 18 (SUTURE) ×3 IMPLANT
SUT STRATAFIX 0 PDS 27 VIOLET (SUTURE) ×3
SUT VIC AB 2-0 CT1 27 (SUTURE) ×9
SUT VIC AB 2-0 CT1 TAPERPNT 27 (SUTURE) ×3 IMPLANT
SUTURE STRATFX 0 PDS 27 VIOLET (SUTURE) ×1 IMPLANT
TRAY FOLEY MTR SLVR 14FR STAT (SET/KITS/TRAYS/PACK) ×3 IMPLANT
TRAY FOLEY MTR SLVR 16FR STAT (SET/KITS/TRAYS/PACK) IMPLANT
WATER STERILE IRR 1000ML POUR (IV SOLUTION) ×6 IMPLANT
WRAP KNEE MAXI GEL POST OP (GAUZE/BANDAGES/DRESSINGS) ×3 IMPLANT
YANKAUER SUCT BULB TIP 10FT TU (MISCELLANEOUS) ×3 IMPLANT

## 2020-04-30 NOTE — Progress Notes (Signed)
Assisted Dr. Turk with right, ultrasound guided, adductor canal block. Side rails up, monitors on throughout procedure. See vital signs in flow sheet. Tolerated Procedure well.  

## 2020-04-30 NOTE — Interval H&P Note (Signed)
History and Physical Interval Note:  04/30/2020 11:05 AM  Stephanie Newton  has presented today for surgery, with the diagnosis of RIGHT KNEE OSTEOARTHRITIS.  The various methods of treatment have been discussed with the patient and family. After consideration of risks, benefits and other options for treatment, the patient has consented to  Procedure(s) with comments: RIGHT TOTAL KNEE ARTHROPLASTY (Right) - 73min as a surgical intervention.  The patient's history has been reviewed, patient examined, no change in status, stable for surgery.  I have reviewed the patient's chart and labs.  Questions were answered to the patient's satisfaction.     Pilar Plate Shacora Zynda

## 2020-04-30 NOTE — Progress Notes (Signed)
Orthopedic Tech Progress Note Patient Details:  Stephanie Newton September 10, 1941 [id number]  Patient ID: Stephanie Newton, female   DOB: 23-Aug-1941, 79 y.o.   MRN: [id number]   Stephanie Newton 04/30/2020, 5:39 PM cpm removed. [place]

## 2020-04-30 NOTE — Op Note (Signed)
OPERATIVE REPORT-TOTAL KNEE ARTHROPLASTY   Pre-operative diagnosis- Osteoarthritis  Right knee(s)  Post-operative diagnosis- Osteoarthritis Right knee(s)  Procedure-  Right  Total Knee Arthroplasty  Surgeon- Dione Plover. Mccormick Macon, MD  Assistant- Griffith Citron, PA-C   Anesthesia-  Adductor canal block and spinal  EBL-50 mL   Drains Hemovac  Tourniquet time-  Total Tourniquet Time Documented: Thigh (Right) - 35 minutes Total: Thigh (Right) - 35 minutes     Complications- None  Condition-PACU - hemodynamically stable.   Brief Clinical Note  Stephanie Newton is a 79 y.o. year old female with end stage OA of her right knee with progressively worsening pain and dysfunction. She has constant pain, with activity and at rest and significant functional deficits with difficulties even with ADLs. She has had extensive non-op management including analgesics, injections of cortisone and viscosupplements, and home exercise program, but remains in significant pain with significant dysfunction.Radiographs show bone on bone arthritis medial and patellofemoral. She presents now for right Total Knee Arthroplasty.    Procedure in detail---   The patient is brought into the operating room and positioned supine on the operating table. After successful administration of  Adductor canal block and spinal,   a tourniquet is placed high on the  Right thigh(s) and the lower extremity is prepped and draped in the usual sterile fashion. Time out is performed by the operating team and then the  Right lower extremity is wrapped in Esmarch, knee flexed and the tourniquet inflated to 300 mmHg.       A midline incision is made with a ten blade through the subcutaneous tissue to the level of the extensor mechanism. A fresh blade is used to make a medial parapatellar arthrotomy. Soft tissue over the proximal medial tibia is subperiosteally elevated to the joint line with a knife and into the semimembranosus bursa with  a Cobb elevator. Soft tissue over the proximal lateral tibia is elevated with attention being paid to avoiding the patellar tendon on the tibial tubercle. The patella is everted, knee flexed 90 degrees and the ACL and PCL are removed. Findings are bone on bone medial and patellofemoral with large global osteophytes,        The drill is used to create a starting hole in the distal femur and the canal is thoroughly irrigated with sterile saline to remove the fatty contents. The 5 degree Right  valgus alignment guide is placed into the femoral canal and the distal femoral cutting block is pinned to remove 9 mm off the distal femur. Resection is made with an oscillating saw.      The tibia is subluxed forward and the menisci are removed. The extramedullary alignment guide is placed referencing proximally at the medial aspect of the tibial tubercle and distally along the second metatarsal axis and tibial crest. The block is pinned to remove 61mm off the more deficient medial  side. Resection is made with an oscillating saw. Size 4is the most appropriate size for the tibia and the proximal tibia is prepared with the modular drill and keel punch for that size.      The femoral sizing guide is placed and size 5 is most appropriate. Rotation is marked off the epicondylar axis and confirmed by creating a rectangular flexion gap at 90 degrees. The size 5 cutting block is pinned in this rotation and the anterior, posterior and chamfer cuts are made with the oscillating saw. The intercondylar block is then placed and that cut is made.  Trial size 4 tibial component, trial size 5 narrow posterior stabilized femur and a 8  mm posterior stabilized rotating platform insert trial is placed. Full extension is achieved with excellent varus/valgus and anterior/posterior balance throughout full range of motion. The patella is everted and thickness measured to be 21  mm. Free hand resection is taken to 12 mm, a 35 template is  placed, lug holes are drilled, trial patella is placed, and it tracks normally. Osteophytes are removed off the posterior femur with the trial in place. All trials are removed and the cut bone surfaces prepared with pulsatile lavage. Cement is mixed and once ready for implantation, the size 4 tibial implant, size  5 narrow posterior stabilized femoral component, and the size 35 patella are cemented in place and the patella is held with the clamp. The trial insert is placed and the knee held in full extension. The Exparel (20 ml mixed with 60 ml saline) is injected into the extensor mechanism, posterior capsule, medial and lateral gutters and subcutaneous tissues.  All extruded cement is removed and once the cement is hard the permanent 8 mm posterior stabilized rotating platform insert is placed into the tibial tray.      The wound is copiously irrigated with saline solution and the extensor mechanism closed over a hemovac drain with #1 V-loc suture. The tourniquet is released for a total tourniquet time of 35 minutes. Flexion against gravity is 140 degrees and the patella tracks normally. Subcutaneous tissue is closed with 2.0 vicryl and subcuticular with running 4.0 Monocryl. The incision is cleaned and dried and steri-strips and a bulky sterile dressing are applied. The limb is placed into a knee immobilizer and the patient is awakened and transported to recovery in stable condition.      Please note that a surgical assistant was a medical necessity for this procedure in order to perform it in a safe and expeditious manner. Surgical assistant was necessary to retract the ligaments and vital neurovascular structures to prevent injury to them and also necessary for proper positioning of the limb to allow for anatomic placement of the prosthesis.   Dione Plover Stephanie Sacco, MD    04/30/2020, 1:21 PM

## 2020-04-30 NOTE — Anesthesia Postprocedure Evaluation (Signed)
Anesthesia Post Note  Patient: GENESE HIBBETT  Procedure(s) Performed: RIGHT TOTAL KNEE ARTHROPLASTY (Right Knee)     Patient location during evaluation: PACU Anesthesia Type: Spinal Level of consciousness: oriented and awake and alert Pain management: pain level controlled Vital Signs Assessment: post-procedure vital signs reviewed and stable Respiratory status: spontaneous breathing, respiratory function stable and patient connected to nasal cannula oxygen Cardiovascular status: blood pressure returned to baseline and stable Postop Assessment: no headache, no backache, no apparent nausea or vomiting, spinal receding and patient able to bend at knees Anesthetic complications: no    Last Vitals:  Vitals:   04/30/20 1639 04/30/20 1808  BP: (!) 143/79 111/74  Pulse: 68 65  Resp: 16 16  Temp: 36.6 C (!) 35.8 C  SpO2: 100% 100%    Last Pain:  Vitals:   04/30/20 1808  TempSrc: Axillary  PainSc:                  Catalina Gravel

## 2020-04-30 NOTE — Progress Notes (Signed)
Orthopedic Tech Progress Note Patient Details:  Stephanie Newton [birth year] [id number]  Patient ID: Stephanie Newton, female   DOB: [birth year], 79 y.o.   MRN: [id number]   Stephanie Newton 04/30/2020, 1:51 PM Pt placed in cpm at 1345

## 2020-04-30 NOTE — Care Plan (Signed)
Ortho Bundle Case Management Note  Patient Details  Name: Stephanie Newton MRN: [id number] Date of Birth: 07-Nov-1941  R TKA on 04-30-20 DCP:  Home with husband and son. DME:  No needs. Has equipment from previous knee surgery. PT:  [facility].  PT eval scheduled on 05-06-20.                   DME Arranged:  N/A DME Agency:  NA  HH Arranged:  NA HH Agency:  NA  Additional Comments: Please contact me with any questions of if this plan should need to change.  Marianne Sofia, RN,CCM [facility]  [phone] 04/30/2020, 3:15 PM

## 2020-04-30 NOTE — Transfer of Care (Signed)
Immediate Anesthesia Transfer of Care Note  Patient: Stephanie Newton  Procedure(s) Performed: RIGHT TOTAL KNEE ARTHROPLASTY (Right Knee)  Patient Location: PACU  Anesthesia Type:MAC and Spinal  Level of Consciousness: awake, alert , oriented and patient cooperative  Airway & Oxygen Therapy: Patient Spontanous Breathing and Patient connected to face mask oxygen  Post-op Assessment: Report given to RN and Post -op Vital signs reviewed and stable  Post vital signs: Reviewed and stable  Last Vitals:  Vitals Value Taken Time  BP 87/71 04/30/20 1340  Temp    Pulse 68 04/30/20 1342  Resp 21 04/30/20 1342  SpO2 100 % 04/30/20 1342  Vitals shown include unvalidated device data.  Last Pain:  Vitals:   04/30/20 1149  TempSrc:   PainSc: 0-No pain      Patients Stated Pain Goal: 3 (19/75/88 3254)  Complications: No apparent anesthesia complications

## 2020-04-30 NOTE — Evaluation (Signed)
Physical Therapy Evaluation Patient Details Name: Stephanie Newton MRN: [id number] DOB: 01-Aug-1941 Today's Date: 04/30/2020   History of Present Illness  Patient is 79 y.o. female s/p Rt TKA on 04/30/20 with PMH significant for HTN, Lt TKA in 2019, cardic cath in 2019.  Clinical Impression  Stephanie Newton is a 79 y.o. female POD 0 s/p Rt TKA. Patient reports independence with mobility at baseline. Patient is now limited by functional impairments (see PT problem list below) and requires min assist for transfers and gait with RW. Patient was able to ambulate ~40 feet with RW and min assist. Patient experienced 1 episode of LOB required min assist to steady. Patient instructed in exercise to facilitate ROM and circulation. Patient will benefit from continued skilled PT interventions to address impairments and progress towards PLOF. Acute PT will follow to progress mobility and stair training in preparation for safe discharge home.     Follow Up Recommendations Follow surgeon's recommendation for DC plan and follow-up therapies;Outpatient PT    Equipment Recommendations  None recommended by PT    Recommendations for Other Services       Precautions / Restrictions Precautions Precautions: Fall Restrictions Weight Bearing Restrictions: No Other Position/Activity Restrictions: WBAT      Mobility  Bed Mobility Overal bed mobility: Needs Assistance Bed Mobility: Supine to Sit     Supine to sit: Min guard;HOB elevated     General bed mobility comments: cues to use bed rail and pt able to mobilize LE's without assist.  Transfers Overall transfer level: Needs assistance Equipment used: Rolling walker (2 wheeled) Transfers: Sit to/from Stand Sit to Stand: Min assist         General transfer comment: cues for hand placement/technique with RW, assist require for power up and to steady with rising.   Ambulation/Gait Ambulation/Gait assistance: Min assist Gait Distance (Feet): 40  Feet Assistive device: Rolling walker (2 wheeled) Gait Pattern/deviations: Step-to pattern;Decreased stance time - right;Decreased weight shift to right Gait velocity: decreased   General Gait Details: cues for safe step sequencing and safe proximity to RW. Assist for walker placement and to prevent LOB with 1 episode of slight Rt knee buckling.   Stairs         Wheelchair Mobility    Modified Rankin (Stroke Patients Only)       Balance Overall balance assessment: Needs assistance Sitting-balance support: Feet supported Sitting balance-Leahy Scale: Good     Standing balance support: During functional activity;Bilateral upper extremity supported Standing balance-Leahy Scale: Poor          Pertinent Vitals/Pain Pain Assessment: 0-10 Pain Score: 1  Pain Location: Rt knee Pain Descriptors / Indicators: Discomfort Pain Intervention(s): Limited activity within patient's tolerance;Monitored during session;Repositioned;Ice applied    Home Living Family/patient expects to be discharged to:: Private residence Living Arrangements: Spouse/significant other;Children Available Help at Discharge: Family Type of Home: House Home Access: Stairs to enter Entrance Stairs-Rails: None;Right;Left(2 rails at front) Technical brewer of Steps: 3 in garage and front Home Layout: One level Home Equipment: Environmental consultant - 2 wheels;Cane - single point      Prior Function Level of Independence: Independent         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, 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    Extremity/Trunk Assessment   Upper Extremity Assessment Upper Extremity Assessment: Overall WFL for tasks assessed    Lower Extremity Assessment Lower Extremity Assessment: RLE deficits/detail RLE Deficits / Details: pt with  some proximal weakness, able to complete LAQ but no SLR  RLE Sensation: WNL    Cervical / Trunk Assessment Cervical / Trunk Assessment: Normal  Communication    Communication: No difficulties  Cognition Arousal/Alertness: Awake/alert Behavior During Therapy: WFL for tasks assessed/performed Overall Cognitive Status: Within Functional Limits for tasks assessed             General Comments      Exercises Total Joint Exercises Ankle Circles/Pumps: AROM;Both;20 reps;Seated Quad Sets: AROM;Right;10 reps;Seated   Assessment/Plan    PT Assessment Patient needs continued PT services  PT Problem List Decreased strength;Decreased range of motion;Decreased activity tolerance;Decreased balance;Decreased mobility;Decreased knowledge of use of DME;Decreased knowledge of precautions;Pain       PT Treatment Interventions DME instruction;Gait training;Stair training;Functional mobility training;Therapeutic activities;Therapeutic exercise;Balance training;Patient/family education    PT Goals (Current goals can be found in the Care Plan section)  Acute Rehab PT Goals Patient Stated Goal: to get walkign without pain PT Goal Formulation: With patient Time For Goal Achievement: 05/05/20 Potential to Achieve Goals: Good    Frequency 7X/week    AM-PAC PT "6 Clicks" Mobility  Outcome Measure Help needed turning from your back to your side while in a flat bed without using bedrails?: None Help needed moving from lying on your back to sitting on the side of a flat bed without using bedrails?: A Little Help needed moving to and from a bed to a chair (including a wheelchair)?: A Little Help needed standing up from a chair using your arms (e.g., wheelchair or bedside chair)?: A Little Help needed to walk in hospital room?: A Little Help needed climbing 3-5 steps with a railing? : A Lot 6 Click Score: 18    End of Session Equipment Utilized During Treatment: Gait belt Activity Tolerance: Patient tolerated treatment well Patient left: in chair;with call bell/phone within reach;with chair alarm set;with family/visitor present Nurse Communication:  Mobility status PT Visit Diagnosis: Muscle weakness (generalized) (M62.81);Difficulty in walking, not elsewhere classified (R26.2)    Time: [id number] PT Time Calculation (min) (ACUTE ONLY): 31 min   Charges:   PT Evaluation $PT Eval Low Complexity: 1 Low PT Treatments $Gait Training: 8-22 mins       Verner Mould, DPT Physical Therapist with [facility] [phone]  04/30/2020 7:13 PM

## 2020-04-30 NOTE — Anesthesia Procedure Notes (Signed)
Spinal  Patient location during procedure: OR Start time: 04/30/2020 12:06 PM End time: 04/30/2020 12:10 PM Staffing Performed: anesthesiologist  Anesthesiologist: Audry Pili, MD Preanesthetic Checklist Completed: patient identified, IV checked, risks and benefits discussed, surgical consent, monitors and equipment checked, pre-op evaluation and timeout performed Spinal Block Patient position: sitting Prep: DuraPrep Patient monitoring: heart rate, cardiac monitor, continuous pulse ox and blood pressure Approach: midline Location: L3-4 Injection technique: single-shot Needle Needle type: Quincke  Needle gauge: 22 G Additional Notes Consent was obtained prior to the procedure with all questions answered and concerns addressed. Risks including, but not limited to, bleeding, infection, nerve damage, paralysis, failed block, inadequate analgesia, allergic reaction, high spinal, itching, and headache were discussed and the patient wished to proceed. Functioning IV was confirmed and monitors were applied. Sterile prep and drape, including hand hygiene, mask, and sterile gloves were used. The patient was positioned and the spine was prepped. The skin was anesthetized with lidocaine. Free flow of clear CSF was obtained prior to injecting local anesthetic into the CSF. The spinal needle aspirated freely following injection. The needle was carefully withdrawn. The patient tolerated the procedure well.   Renold Don, MD

## 2020-04-30 NOTE — Discharge Instructions (Addendum)
 Frank Aluisio, MD Total Joint Specialist [facility] [place] [address] [place], [place] [address] [phone]  TOTAL KNEE REPLACEMENT POSTOPERATIVE DIRECTIONS    Knee Rehabilitation, Guidelines Following Surgery  Results after knee surgery are often greatly improved when you follow the exercise, range of motion and muscle strengthening exercises prescribed by your doctor. Safety measures are also important to protect the knee from further injury. If any of these exercises cause you to have increased pain or swelling in your knee joint, decrease the amount until you are comfortable again and slowly increase them. If you have problems or questions, call your caregiver or physical therapist for advice.   BLOOD CLOT PREVENTION . Take a 325 mg Aspirin two times a day for three weeks following surgery. Then resume one 81 mg Aspirin once a day. . You may resume your vitamins/supplements upon discharge from the hospital. . Do not take any NSAIDs (Advil, Aleve, Ibuprofen, Meloxicam, etc.) until you have discontinued the 325 mg Aspirin.  HOME CARE INSTRUCTIONS  . Remove items at home which could result in a fall. This includes throw rugs or furniture in walking pathways.  . ICE to the affected knee as much as tolerated. Icing helps control swelling. If the swelling is well controlled you will be more comfortable and rehab easier. Continue to use ice on the knee for pain and swelling from surgery. You may notice swelling that will progress down to the foot and ankle. This is normal after surgery. Elevate the leg when you are not up walking on it.    . Continue to use the breathing machine which will help keep your temperature down. It is common for your temperature to cycle up and down following surgery, especially at night when you are not up moving around and exerting yourself. The breathing machine keeps your lungs expanded and your temperature down. . Do not place pillow  under the operative knee, focus on keeping the knee straight while resting  DIET You may resume your previous home diet once you are discharged from the hospital.  DRESSING / WOUND CARE / SHOWERING . Keep your bulky bandage on for 2 days. On the third post-operative day you may remove the Ace bandage and gauze. There is a waterproof adhesive bandage on your skin which will stay in place until your first follow-up appointment. Once you remove this you will not need to place another bandage . You may begin showering 3 days following surgery, but do not submerge the incision under water.  ACTIVITY For the first 5 days, the key is rest and control of pain and swelling . Do your home exercises twice a day starting on post-operative day 3. On the days you go to physical therapy, just do the home exercises once that day. . You should rest, ice and elevate the leg for 50 minutes out of every hour. Get up and walk/stretch for 10 minutes per hour. After 5 days you can increase your activity slowly as tolerated. . Walk with your walker as instructed. Use the walker until you are comfortable transitioning to a cane. Walk with the cane in the opposite hand of the operative leg. You may discontinue the cane once you are comfortable and walking steadily. . Avoid periods of inactivity such as sitting longer than an hour when not asleep. This helps prevent blood clots.  . You may discontinue the knee immobilizer once you are able to perform a straight leg raise while lying down. .   You may resume a sexual relationship in one month or when given the OK by your doctor.  . You may return to work once you are cleared by your doctor.  . Do not drive a car for 6 weeks or until released by your surgeon.  . Do not drive while taking narcotics.  TED HOSE STOCKINGS Wear the elastic stockings on both legs for three weeks following surgery during the day. You may remove them at night for sleeping.  WEIGHT BEARING Weight  bearing as tolerated with assist device (walker, cane, etc) as directed, use it as long as suggested by your surgeon or therapist, typically at least 4-6 weeks.  POSTOPERATIVE CONSTIPATION PROTOCOL Constipation - defined medically as fewer than three stools per week and severe constipation as less than one stool per week.  One of the most common issues patients have following surgery is constipation.  Even if you have a regular bowel pattern at home, your normal regimen is likely to be disrupted due to multiple reasons following surgery.  Combination of anesthesia, postoperative narcotics, change in appetite and fluid intake all can affect your bowels.  In order to avoid complications following surgery, here are some recommendations in order to help you during your recovery period.  . Colace (docusate) - Pick up an over-the-counter form of Colace or another stool softener and take twice a day as long as you are requiring postoperative pain medications.  Take with a full glass of water daily.  If you experience loose stools or diarrhea, hold the colace until you stool forms back up. If your symptoms do not get better within 1 week or if they get worse, check with your doctor. . Dulcolax (bisacodyl) - Pick up over-the-counter and take as directed by the product packaging as needed to assist with the movement of your bowels.  Take with a full glass of water.  Use this product as needed if not relieved by Colace only.  . MiraLax (polyethylene glycol) - Pick up over-the-counter to have on hand. MiraLax is a solution that will increase the amount of water in your bowels to assist with bowel movements.  Take as directed and can mix with a glass of water, juice, soda, coffee, or tea. Take if you go more than two days without a movement. Do not use MiraLax more than once per day. Call your doctor if you are still constipated or irregular after using this medication for 7 days in a row.  If you continue to have  problems with postoperative constipation, please contact the office for further assistance and recommendations.  If you experience "the worst abdominal pain ever" or develop nausea or vomiting, please contact the office immediatly for further recommendations for treatment.  ITCHING If you experience itching with your medications, try taking only a single pain pill, or even half a pain pill at a time.  You can also use Benadryl over the counter for itching or also to help with sleep.   MEDICATIONS See your medication summary on the "After Visit Summary" that the nursing staff will review with you prior to discharge.  You may have some home medications which will be placed on hold until you complete the course of blood thinner medication.  It is important for you to complete the blood thinner medication as prescribed by your surgeon.  Continue your approved medications as instructed at time of discharge.  PRECAUTIONS . If you experience chest pain or shortness of breath - call 911 immediately for   transfer to the hospital emergency department.  . If you develop a fever greater that 101 F, purulent drainage from wound, increased redness or drainage from wound, foul odor from the wound/dressing, or calf pain - CONTACT YOUR SURGEON.                                                   FOLLOW-UP APPOINTMENTS Make sure you keep all of your appointments after your operation with your surgeon and caregivers. You should call the office at the above phone number and make an appointment for approximately two weeks after the date of your surgery or on the date instructed by your surgeon outlined in the "After Visit Summary".  RANGE OF MOTION AND STRENGTHENING EXERCISES  Rehabilitation of the knee is important following a knee injury or an operation. After just a few days of immobilization, the muscles of the thigh which control the knee become weakened and shrink (atrophy). Knee exercises are designed to build up the  tone and strength of the thigh muscles and to improve knee motion. Often times heat used for twenty to thirty minutes before working out will loosen up your tissues and help with improving the range of motion but do not use heat for the first two weeks following surgery. These exercises can be done on a training (exercise) mat, on the floor, on a table or on a bed. Use what ever works the best and is most comfortable for you Knee exercises include:  . Leg Lifts - While your knee is still immobilized in a splint or cast, you can do straight leg raises. Lift the leg to 60 degrees, hold for 3 sec, and slowly lower the leg. Repeat 10-20 times 2-3 times daily. Perform this exercise against resistance later as your knee gets better.  . Quad and Hamstring Sets - Tighten up the muscle on the front of the thigh (Quad) and hold for 5-10 sec. Repeat this 10-20 times hourly. Hamstring sets are done by pushing the foot backward against an object and holding for 5-10 sec. Repeat as with quad sets.   Leg Slides: Lying on your back, slowly slide your foot toward your buttocks, bending your knee up off the floor (only go as far as is comfortable). Then slowly slide your foot back down until your leg is flat on the floor again.  Angel Wings: Lying on your back spread your legs to the side as far apart as you can without causing discomfort.  A rehabilitation program following serious knee injuries can speed recovery and prevent re-injury in the future due to weakened muscles. Contact your doctor or a physical therapist for more information on knee rehabilitation.   IF YOU ARE TRANSFERRED TO A SKILLED REHAB FACILITY If the patient is transferred to a skilled rehab facility following release from the hospital, a list of the current medications will be sent to the facility for the patient to continue.  When discharged from the skilled rehab facility, please have the facility set up the patient's Home Health Physical Therapy  prior to being released. Also, the skilled facility will be responsible for providing the patient with their medications at time of release from the facility to include their pain medication, the muscle relaxants, and their blood thinner medication. If the patient is still at the rehab facility at time of the   two week follow up appointment, the skilled rehab facility will also need to assist the patient in arranging follow up appointment in our office and any transportation needs.  MAKE SURE YOU:  . Understand these instructions.  . Get help right away if you are not doing well or get worse.   DENTAL ANTIBIOTICS:  In most cases prophylactic antibiotics for Dental procdeures after total joint surgery are not necessary.  Exceptions are as follows:  1. History of prior total joint infection  2. Severely immunocompromised (Organ Transplant, cancer chemotherapy, Rheumatoid biologic meds such as Humera)  3. Poorly controlled diabetes (A1C &gt; 8.0, blood glucose over 200)  If you have one of these conditions, contact your surgeon for an antibiotic prescription, prior to your dental procedure.    Pick up stool softner and laxative for home use following surgery while on pain medications. Do not submerge incision under water. Please use good hand washing techniques while changing dressing each day. May shower starting three days after surgery. Please use a clean towel to pat the incision dry following showers. Continue to use ice for pain and swelling after surgery. Do not use any lotions or creams on the incision until instructed by your surgeon.  

## 2020-04-30 NOTE — Anesthesia Procedure Notes (Signed)
Anesthesia Regional Block: Adductor canal block   Pre-Anesthetic Checklist: ,, timeout performed, Correct Patient, Correct Site, Correct Laterality, Correct Procedure, Correct Position, site marked, Risks and benefits discussed,  Surgical consent,  Pre-op evaluation,  At surgeon's request and post-op pain management  Laterality: Right  Prep: chloraprep       Needles:  Injection technique: Single-shot  Needle Type: Echogenic Needle     Needle Length: 9cm  Needle Gauge: 21     Additional Needles:   Procedures:,,,, ultrasound used (permanent image in chart),,,,  Narrative:  Start time: 04/30/2020 11:40 AM End time: 04/30/2020 11:47 AM Injection made incrementally with aspirations every 5 mL.  Performed by: Personally  Anesthesiologist: Catalina Gravel, MD  Additional Notes: No pain on injection. No increased resistance to injection. Injection made in 5cc increments.  Good needle visualization.  Patient tolerated procedure well.

## 2020-04-30 NOTE — Plan of Care (Signed)
  Problem: Education: Goal: Knowledge of the prescribed therapeutic regimen will improve Outcome: Progressing   

## 2020-05-01 ENCOUNTER — Encounter: Payer: Self-pay | Admitting: *Deleted

## 2020-05-01 DIAGNOSIS — Z7982 Long term (current) use of aspirin: Secondary | ICD-10-CM | POA: Diagnosis not present

## 2020-05-01 DIAGNOSIS — M1711 Unilateral primary osteoarthritis, right knee: Secondary | ICD-10-CM | POA: Diagnosis not present

## 2020-05-01 DIAGNOSIS — Z96652 Presence of left artificial knee joint: Secondary | ICD-10-CM | POA: Diagnosis not present

## 2020-05-01 DIAGNOSIS — E785 Hyperlipidemia, unspecified: Secondary | ICD-10-CM | POA: Diagnosis not present

## 2020-05-01 DIAGNOSIS — I10 Essential (primary) hypertension: Secondary | ICD-10-CM | POA: Diagnosis not present

## 2020-05-01 DIAGNOSIS — I252 Old myocardial infarction: Secondary | ICD-10-CM | POA: Diagnosis not present

## 2020-05-01 LAB — BASIC METABOLIC PANEL
Anion gap: 9 (ref 5–15)
BUN: 14 mg/dL (ref 8–23)
CO2: 21 mmol/L — ABNORMAL LOW (ref 22–32)
Calcium: 8.8 mg/dL — ABNORMAL LOW (ref 8.9–10.3)
Chloride: 107 mmol/L (ref 98–111)
Creatinine, Ser: 0.88 mg/dL (ref 0.44–1.00)
GFR calc Af Amer: >60 mL/min (ref >60)
GFR calc non Af Amer: >60 mL/min (ref >60)
Glucose, Bld: 159 mg/dL — ABNORMAL HIGH (ref 70–99)
Potassium: 4 mmol/L (ref 3.5–5.1)
Sodium: 137 mmol/L (ref 135–145)

## 2020-05-01 LAB — CBC
HCT: 36.4 % (ref 36.0–46.0)
Hemoglobin: 11.9 g/dL — ABNORMAL LOW (ref 12.0–15.0)
MCH: 30.3 pg (ref 26.0–34.0)
MCHC: 32.7 g/dL (ref 30.0–36.0)
MCV: 92.6 fL (ref 80.0–100.0)
Platelets: 204 10*3/uL (ref 150–400)
RBC: 3.93 MIL/uL (ref 3.87–5.11)
RDW: 13.2 % (ref 11.5–15.5)
WBC: 7.3 10*3/uL (ref 4.0–10.5)
nRBC: 0 % (ref 0.0–0.2)

## 2020-05-01 MED ORDER — SODIUM CHLORIDE 0.9 % IV BOLUS: 250.0000 mL | Freq: Once | INTRAVENOUS | Status: DC

## 2020-05-01 MED ORDER — METHOCARBAMOL 500 MG PO TABS: 500.0000 mg | ORAL_TABLET | Freq: Four times a day (QID) | ORAL | 0 refills | Status: AC | PRN

## 2020-05-01 MED ORDER — OXYCODONE HCL 5 MG PO TABS: 5.0000 mg | ORAL_TABLET | Freq: Four times a day (QID) | ORAL | 0 refills | Status: DC | PRN

## 2020-05-01 MED ORDER — ASPIRIN 325 MG PO TBEC: 325.0000 mg | DELAYED_RELEASE_TABLET | Freq: Two times a day (BID) | ORAL | 0 refills | Status: AC

## 2020-05-01 NOTE — Progress Notes (Signed)
Physical Therapy Treatment Patient Details Name: Stephanie Newton MRN: [id number] DOB: 09-21-1941 Today's Date: 05/01/2020    History of Present Illness Patient is 79 y.o. female s/p Rt TKA on 04/30/20 with PMH significant for HTN, Lt TKA in 2019, cardic cath in 2019.    PT Comments    POD # 1 am session. Assisted OOB to amb.  General bed mobility comments: demonstrated and instructed how to use belt to self assist.  General transfer comment: 25% VC's on proper hand placement and safety with turns.  General Gait Details: cues for safe step sequencing and safe proximity to RW.Tolerated well. Then returned to room to perform some TE's following HEP handout.  Instructed on proper tech, freq as well as use of ICE.  Pt will need another PT session to address stairs.    Follow Up Recommendations  Outpatient PT     Equipment Recommendations  None recommended by PT    Recommendations for Other Services       Precautions / Restrictions Precautions Precautions: Fall Precaution Comments: instructed no pillow under knee Restrictions Weight Bearing Restrictions: No Other Position/Activity Restrictions: WBAT    Mobility  Bed Mobility Overal bed mobility: Needs Assistance Bed Mobility: Supine to Sit     Supine to sit: Supervision     General bed mobility comments: demonstrated and instructed how to use belt to self assist  Transfers Overall transfer level: Needs assistance Equipment used: Rolling walker (2 wheeled) Transfers: Sit to/from Stand Sit to Stand: Supervision;Min guard         General transfer comment: 25% VC's on proper hand placement and safety with turns  Ambulation/Gait Ambulation/Gait assistance: Supervision;Min guard Gait Distance (Feet): 44 Feet Assistive device: Rolling walker (2 wheeled) Gait Pattern/deviations: Step-to pattern;Decreased stance time - right;Decreased weight shift to right Gait velocity: decreased   General Gait Details: cues for safe  step sequencing and safe proximity to RW.Tolerated well.   Stairs             Wheelchair Mobility    Modified Rankin (Stroke Patients Only)       Balance                                            Cognition Arousal/Alertness: Awake/alert Behavior During Therapy: WFL for tasks assessed/performed Overall Cognitive Status: Within Functional Limits for tasks assessed                                 General Comments: very motivated      Exercises      General Comments        Pertinent Vitals/Pain Pain Assessment: 0-10 Pain Score: 4  Pain Location: Rt knee Pain Descriptors / Indicators: Discomfort;Tender;Operative site guarding;Sore Pain Intervention(s): Monitored during session;Premedicated before session;Repositioned;Ice applied    Home Living                      Prior Function            PT Goals (current goals can now be found in the care plan section) Progress towards PT goals: Progressing toward goals    Frequency    7X/week      PT Plan Current plan remains appropriate    Co-evaluation  AM-PAC PT "6 Clicks" Mobility   Outcome Measure  Help needed turning from your back to your side while in a flat bed without using bedrails?: None Help needed moving from lying on your back to sitting on the side of a flat bed without using bedrails?: A Little Help needed moving to and from a bed to a chair (including a wheelchair)?: A Little Help needed standing up from a chair using your arms (e.g., wheelchair or bedside chair)?: A Little Help needed to walk in hospital room?: A Little   6 Click Score: 16    End of Session Equipment Utilized During Treatment: Gait belt Activity Tolerance: Patient tolerated treatment well Patient left: in chair;with call bell/phone within reach;with chair alarm set;with family/visitor present Nurse Communication: Mobility status PT Visit Diagnosis: Muscle  weakness (generalized) (M62.81);Difficulty in walking, not elsewhere classified (R26.2)     Time: [id number] PT Time Calculation (min) (ACUTE ONLY): 25 min  Charges:  $Gait Training: 8-22 mins $Therapeutic Exercise: 8-22 mins                     Rica Koyanagi  PTA Acute  Rehabilitation Services Pager      [phone] Office      [phone]

## 2020-05-01 NOTE — Progress Notes (Addendum)
   Subjective: 1 Day Post-Op Procedure(s) (LRB): RIGHT TOTAL KNEE ARTHROPLASTY (Right) Patient reports pain as mild.   Patient seen in rounds for Dr. Wynelle Link. Patient is well, and has had no acute complaints or problems. States pain in the right knee has been minimal overnight. Advised that pain will increase when anesthetic blocks wear off. Denies chest pain or SOB. No issues overnight. Foley catheter to be removed this AM. We will continue therapy today, ambulated 40' yesterday.   Objective: Vital signs in last 24 hours: Temp:  [96.4 F (35.8 C)-98.8 F (37.1 C)] 98.8 F (37.1 C) (05/27 0507) Pulse Rate:  [65-83] 72 (05/27 0507) Resp:  [12-22] 18 (05/27 0507) BP: (80-170)/(51-105) 106/57 (05/27 0507) SpO2:  [89 %-100 %] 98 % (05/27 0507) Weight:  [74.2 kg] 74.2 kg (05/26 1122)  Intake/Output from previous day:  Intake/Output Summary (Last 24 hours) at 05/01/2020 0754 Last data filed at 05/01/2020 [id number] Gross per 24 hour  Intake 4347.38 ml  Output 2100 ml  Net 2247.38 ml     Intake/Output this shift: No intake/output data recorded.  Labs: Recent Labs    05/01/20 0311  HGB 11.9*   Recent Labs    05/01/20 0311  WBC 7.3  RBC 3.93  HCT 36.4  PLT 204   Recent Labs    05/01/20 0311  NA 137  K 4.0  CL 107  CO2 21*  BUN 14  CREATININE 0.88  GLUCOSE 159*  CALCIUM 8.8*   No results for input(s): LABPT, INR in the last 72 hours.  Exam: General - Patient is Alert and Oriented Extremity - Neurologically intact Neurovascular intact Sensation intact distally Dorsiflexion/Plantar flexion intact Dressing - dressing C/D/I Motor Function - intact, moving foot and toes well on exam.   Past Medical History:  Diagnosis Date  . Hypercholesteremia   . Hypertension     Assessment/Plan: 1 Day Post-Op Procedure(s) (LRB): RIGHT TOTAL KNEE ARTHROPLASTY (Right) Principal Problem:   Primary osteoarthritis of knee Active Problems:   Primary osteoarthritis of right  knee  Estimated body mass index is 27.22 kg/m as calculated from the following:   Height as of this encounter: 5\' 5"  (1.651 m).   Weight as of this encounter: 74.2 kg. Advance diet Up with therapy   Patient's anticipated LOS is less than 2 midnights, meeting these requirements: - Younger than 4 - Lives within 1 hour of care - Has a competent adult at home to recover with post-op recover - NO history of  - Chronic pain requiring opiods  - Diabetes  - Coronary Artery Disease  - Heart failure  - Heart attack  - Stroke  - DVT/VTE  - Cardiac arrhythmia  - Respiratory Failure/COPD  - Renal failure  - Anemia  - Advanced Liver disease  DVT Prophylaxis - Aspirin Weight bearing as tolerated. D/C O2 and pulse ox and try on room air. Continue therapy.  BP soft this AM, 250 mL bolus ordered. Hold BP meds if blood pressure < 120/80 mmHg.  Plan is to go Home after hospital stay. Plan for discharge later today if progresses with therapy and is meeting her goals. Scheduled for outpatient physical therapy at [facility]. Follow-up in the office on June 8th.  Theresa Duty, PA-C Orthopedic Surgery [phone] 05/01/2020, 7:54 AM

## 2020-05-01 NOTE — Progress Notes (Signed)
Physical Therapy Treatment Patient Details Name: Stephanie Newton MRN: [id number] DOB: 1941/06/21 Today's Date: 05/01/2020    History of Present Illness Patient is 79 y.o. female s/p Rt TKA on 04/30/20 with PMH significant for HTN, Lt TKA in 2019, cardic cath in 2019.    PT Comments    POD # 1 pm session Assisted with amb in hallway, practiced stairs.  Then returned to room to perform some TE's following HEP handout.  Instructed on proper tech, freq as well as use of ICE.   Addressed all mobility questions, discussed appropriate activity, educated on use of ICE.  Pt ready for D/C to home.   Follow Up Recommendations  Outpatient PT     Equipment Recommendations  None recommended by PT    Recommendations for Other Services       Precautions / Restrictions Precautions Precautions: Fall Precaution Comments: instructed no pillow under knee Restrictions Weight Bearing Restrictions: No Other Position/Activity Restrictions: WBAT    Mobility  Bed Mobility Overal bed mobility: Needs Assistance Bed Mobility: Supine to Sit     Supine to sit: Supervision     General bed mobility comments: OOB in recliner  Transfers Overall transfer level: Needs assistance Equipment used: Rolling walker (2 wheeled) Transfers: Sit to/from Stand Sit to Stand: Supervision;Min guard         General transfer comment: 25% VC's on proper hand placement and safety with turns  Ambulation/Gait Ambulation/Gait assistance: Supervision;Min guard Gait Distance (Feet): 44 Feet Assistive device: Rolling walker (2 wheeled) Gait Pattern/deviations: Step-to pattern;Decreased stance time - right;Decreased weight shift to right Gait velocity: decreased   General Gait Details: cues for safe step sequencing and safe proximity to RW.Tolerated well.   Stairs Stairs: Yes Stairs assistance: Min assist Stair Management: No rails;Step to pattern;Forwards;With walker Number of Stairs: 2 General stair  comments: 50% VC's on proper tech up forward with walker with someone "holding walker"   Wheelchair Mobility    Modified Rankin (Stroke Patients Only)       Balance                                            Cognition Arousal/Alertness: Awake/alert Behavior During Therapy: WFL for tasks assessed/performed Overall Cognitive Status: Within Functional Limits for tasks assessed                                 General Comments: very motivated      Exercises  5 reps all seated TKR TE's following HEP handout.     General Comments        Pertinent Vitals/Pain Pain Assessment: 0-10 Pain Score: 4  Pain Location: Rt knee Pain Descriptors / Indicators: Discomfort;Tender;Operative site guarding;Sore Pain Intervention(s): Monitored during session;Premedicated before session;Repositioned;Ice applied    Home Living                      Prior Function            PT Goals (current goals can now be found in the care plan section) Progress towards PT goals: Progressing toward goals    Frequency    7X/week      PT Plan Current plan remains appropriate    Co-evaluation              AM-PAC PT "  6 Clicks" Mobility   Outcome Measure  Help needed turning from your back to your side while in a flat bed without using bedrails?: None Help needed moving from lying on your back to sitting on the side of a flat bed without using bedrails?: A Little Help needed moving to and from a bed to a chair (including a wheelchair)?: A Little Help needed standing up from a chair using your arms (e.g., wheelchair or bedside chair)?: A Little Help needed to walk in hospital room?: A Little   6 Click Score: 16    End of Session Equipment Utilized During Treatment: Gait belt Activity Tolerance: Patient tolerated treatment well Patient left: in chair;with call bell/phone within reach;with chair alarm set;with family/visitor present Nurse  Communication: Mobility status PT Visit Diagnosis: Muscle weakness (generalized) (M62.81);Difficulty in walking, not elsewhere classified (R26.2)     Time: [id number] PT Time Calculation (min) (ACUTE ONLY): 25 min  Charges:  $Gait Training: 8-22 mins $Therapeutic Activity: 8-22 mins                     Rica Koyanagi  PTA Acute  Rehabilitation Services Pager      [phone] Office      [phone]

## 2020-05-01 NOTE — Progress Notes (Signed)
Pt is ortho bundle - met briefly with her to confirm plans for OPPT at [facility] and no DME needs.  No TOC needs at this time.  Stephanie Acrey, LCSW

## 2020-05-06 DIAGNOSIS — Z79899 Other long term (current) drug therapy: Secondary | ICD-10-CM | POA: Diagnosis not present

## 2020-05-06 DIAGNOSIS — M25561 Pain in right knee: Secondary | ICD-10-CM | POA: Diagnosis not present

## 2020-05-06 DIAGNOSIS — I10 Essential (primary) hypertension: Secondary | ICD-10-CM | POA: Diagnosis not present

## 2020-05-06 DIAGNOSIS — I471 Supraventricular tachycardia: Secondary | ICD-10-CM | POA: Diagnosis not present

## 2020-05-06 DIAGNOSIS — R42 Dizziness and giddiness: Secondary | ICD-10-CM | POA: Diagnosis not present

## 2020-05-08 DIAGNOSIS — M25561 Pain in right knee: Secondary | ICD-10-CM | POA: Diagnosis not present

## 2020-05-12 DIAGNOSIS — M25561 Pain in right knee: Secondary | ICD-10-CM | POA: Diagnosis not present

## 2020-05-12 DIAGNOSIS — R739 Hyperglycemia, unspecified: Secondary | ICD-10-CM | POA: Diagnosis not present

## 2020-05-14 DIAGNOSIS — M25561 Pain in right knee: Secondary | ICD-10-CM | POA: Diagnosis not present

## 2020-05-16 DIAGNOSIS — M25561 Pain in right knee: Secondary | ICD-10-CM | POA: Diagnosis not present

## 2020-05-19 DIAGNOSIS — M25561 Pain in right knee: Secondary | ICD-10-CM | POA: Diagnosis not present

## 2020-05-21 DIAGNOSIS — M25561 Pain in right knee: Secondary | ICD-10-CM | POA: Diagnosis not present

## 2020-06-11 DIAGNOSIS — M25561 Pain in right knee: Secondary | ICD-10-CM | POA: Diagnosis not present

## 2020-06-13 DIAGNOSIS — M25561 Pain in right knee: Secondary | ICD-10-CM | POA: Diagnosis not present

## 2020-06-17 DIAGNOSIS — M25561 Pain in right knee: Secondary | ICD-10-CM | POA: Diagnosis not present

## 2020-06-19 DIAGNOSIS — M25561 Pain in right knee: Secondary | ICD-10-CM | POA: Diagnosis not present

## 2020-06-24 DIAGNOSIS — E7800 Pure hypercholesterolemia, unspecified: Secondary | ICD-10-CM | POA: Diagnosis not present

## 2020-06-24 DIAGNOSIS — I10 Essential (primary) hypertension: Secondary | ICD-10-CM | POA: Diagnosis not present

## 2020-06-24 DIAGNOSIS — M1712 Unilateral primary osteoarthritis, left knee: Secondary | ICD-10-CM | POA: Diagnosis not present

## 2020-06-24 DIAGNOSIS — M25561 Pain in right knee: Secondary | ICD-10-CM | POA: Diagnosis not present

## 2020-06-26 DIAGNOSIS — M25561 Pain in right knee: Secondary | ICD-10-CM | POA: Diagnosis not present

## 2020-07-01 DIAGNOSIS — M25561 Pain in right knee: Secondary | ICD-10-CM | POA: Diagnosis not present

## 2020-07-03 DIAGNOSIS — M25561 Pain in right knee: Secondary | ICD-10-CM | POA: Diagnosis not present

## 2020-07-25 ENCOUNTER — Telehealth: Payer: Self-pay | Admitting: Interventional Cardiology

## 2020-07-25 ENCOUNTER — Other Ambulatory Visit: Payer: Self-pay | Admitting: Interventional Cardiology

## 2020-07-25 NOTE — Telephone Encounter (Signed)
*  STAT* If patient is at the pharmacy, call can be transferred to refill team.   1. Which medications need to be refilled? (please list name of each medication and dose if known) Metoprolol Succinate 100 MG  2. Which pharmacy/location (including street and city if local pharmacy) is medication to be sent to? [facility]  3. Do they need a 30 day or 90 day supply? 90 day supply

## 2020-07-28 DIAGNOSIS — I10 Essential (primary) hypertension: Secondary | ICD-10-CM | POA: Diagnosis not present

## 2020-07-28 DIAGNOSIS — M1712 Unilateral primary osteoarthritis, left knee: Secondary | ICD-10-CM | POA: Diagnosis not present

## 2020-07-28 DIAGNOSIS — E7800 Pure hypercholesterolemia, unspecified: Secondary | ICD-10-CM | POA: Diagnosis not present

## 2020-07-28 NOTE — Telephone Encounter (Signed)
*  STAT* If patient is at the pharmacy, call can be transferred to refill team.   1. Which medications need to be refilled? (please list name of each medication and dose if known)  Metoprolol Succinate 100 MG  2. Which pharmacy/location (including street and city if local pharmacy) is medication to be sent to? [facility] - [place], [place] - [place] [address]  3. Do they need a 30 day or 90 day supply? 90 day supply  Refill request sent on 08/20 was never sent to pharmacy. Pt's mail order is going out today so please send ASAP.

## 2020-07-28 NOTE — Telephone Encounter (Signed)
Spoke with pt and she states that she has not been on Metoprolol since her hospitalization in May.  BP has been elevated more recently.  She states that she reached out to PCP and Dr. Felipa Eth was to start her back on some of her medications, including Metoprolol.  Advised pt that since she reported her issue to them and Dr. Felipa Eth recommended restarting meds, that office would need to be the one to prescribe the medication.  Pt appreciative for call.

## 2020-07-28 NOTE — Telephone Encounter (Signed)
Dr. Carlyle Lipa office is requesting a refill on metoprolol 100 mg tablet. This medication was D/C. Does pt still supposed to be taking this medication? Please address

## 2020-08-05 DIAGNOSIS — I10 Essential (primary) hypertension: Secondary | ICD-10-CM | POA: Diagnosis not present

## 2020-08-26 DIAGNOSIS — I10 Essential (primary) hypertension: Secondary | ICD-10-CM | POA: Diagnosis not present

## 2020-09-03 DIAGNOSIS — M1712 Unilateral primary osteoarthritis, left knee: Secondary | ICD-10-CM | POA: Diagnosis not present

## 2020-09-03 DIAGNOSIS — Z23 Encounter for immunization: Secondary | ICD-10-CM | POA: Diagnosis not present

## 2020-09-03 DIAGNOSIS — E7800 Pure hypercholesterolemia, unspecified: Secondary | ICD-10-CM | POA: Diagnosis not present

## 2020-09-03 DIAGNOSIS — I10 Essential (primary) hypertension: Secondary | ICD-10-CM | POA: Diagnosis not present

## 2020-09-17 ENCOUNTER — Other Ambulatory Visit (HOSPITAL_COMMUNITY): Payer: Self-pay | Admitting: Physician Assistant

## 2020-09-17 ENCOUNTER — Other Ambulatory Visit (HOSPITAL_COMMUNITY): Payer: Self-pay | Admitting: Sports Medicine

## 2020-09-17 ENCOUNTER — Other Ambulatory Visit: Payer: Self-pay

## 2020-09-17 ENCOUNTER — Ambulatory Visit (HOSPITAL_COMMUNITY)
Admission: RE | Admit: 2020-09-17 | Discharge: 2020-09-17 | Disposition: A | Discharge status: Home / Self Care | Payer: Medicare Other | Source: Ambulatory Visit | Attending: Physician Assistant | Admitting: Physician Assistant

## 2020-09-17 DIAGNOSIS — M79661 Pain in right lower leg: Secondary | ICD-10-CM

## 2020-09-23 DIAGNOSIS — E7800 Pure hypercholesterolemia, unspecified: Secondary | ICD-10-CM | POA: Diagnosis not present

## 2020-09-23 DIAGNOSIS — I10 Essential (primary) hypertension: Secondary | ICD-10-CM | POA: Diagnosis not present

## 2020-09-23 DIAGNOSIS — M1712 Unilateral primary osteoarthritis, left knee: Secondary | ICD-10-CM | POA: Diagnosis not present

## 2020-10-01 ENCOUNTER — Encounter: Payer: Self-pay | Admitting: *Deleted

## 2020-10-01 ENCOUNTER — Other Ambulatory Visit: Payer: Self-pay | Admitting: *Deleted

## 2020-10-07 DIAGNOSIS — Z96651 Presence of right artificial knee joint: Secondary | ICD-10-CM | POA: Diagnosis not present

## 2020-10-15 DIAGNOSIS — M25661 Stiffness of right knee, not elsewhere classified: Secondary | ICD-10-CM | POA: Diagnosis not present

## 2020-10-15 DIAGNOSIS — Z96651 Presence of right artificial knee joint: Secondary | ICD-10-CM | POA: Diagnosis not present

## 2020-10-17 ENCOUNTER — Telehealth (HOSPITAL_COMMUNITY): Payer: Self-pay | Admitting: *Deleted

## 2020-10-17 NOTE — Telephone Encounter (Signed)
Returning pt's call regarding upcoming cardiac imaging study; pt had question concerning confirmation of date, time, and insurance.  Pt was re-assured that the date and time were correct.  Pt states that she remembers the instructions from January of 2020 when pt had this same cardiac scan.  Pt informed that a RN navigator would reach out a few days before her scan to ensure she didn't have any questions. Pt verbalized understanding.  Burley Saver RN Navigator Cardiac Imaging [facility] and Vascular [phone] office [phone] cell

## 2020-10-23 DIAGNOSIS — Z96651 Presence of right artificial knee joint: Secondary | ICD-10-CM | POA: Diagnosis not present

## 2020-10-23 DIAGNOSIS — M25661 Stiffness of right knee, not elsewhere classified: Secondary | ICD-10-CM | POA: Diagnosis not present

## 2020-10-28 DIAGNOSIS — Z96651 Presence of right artificial knee joint: Secondary | ICD-10-CM | POA: Diagnosis not present

## 2020-10-28 DIAGNOSIS — M25661 Stiffness of right knee, not elsewhere classified: Secondary | ICD-10-CM | POA: Diagnosis not present

## 2020-10-28 DIAGNOSIS — E7800 Pure hypercholesterolemia, unspecified: Secondary | ICD-10-CM | POA: Diagnosis not present

## 2020-10-28 DIAGNOSIS — I10 Essential (primary) hypertension: Secondary | ICD-10-CM | POA: Diagnosis not present

## 2020-10-28 DIAGNOSIS — G8929 Other chronic pain: Secondary | ICD-10-CM | POA: Diagnosis not present

## 2020-10-28 DIAGNOSIS — M1712 Unilateral primary osteoarthritis, left knee: Secondary | ICD-10-CM | POA: Diagnosis not present

## 2020-11-05 DIAGNOSIS — Z96651 Presence of right artificial knee joint: Secondary | ICD-10-CM | POA: Diagnosis not present

## 2020-11-05 DIAGNOSIS — M25661 Stiffness of right knee, not elsewhere classified: Secondary | ICD-10-CM | POA: Diagnosis not present

## 2020-11-11 ENCOUNTER — Other Ambulatory Visit: Payer: Medicare Other | Admitting: *Deleted

## 2020-11-11 ENCOUNTER — Other Ambulatory Visit: Payer: Self-pay

## 2020-11-11 DIAGNOSIS — I2510 Atherosclerotic heart disease of native coronary artery without angina pectoris: Secondary | ICD-10-CM | POA: Diagnosis not present

## 2020-11-11 DIAGNOSIS — I2541 Coronary artery aneurysm: Secondary | ICD-10-CM | POA: Diagnosis not present

## 2020-11-11 LAB — BASIC METABOLIC PANEL
BUN/Creatinine Ratio: 14 (ref 12–28)
BUN: 13 mg/dL (ref 8–27)
CO2: 24 mmol/L (ref 20–29)
Calcium: 10.2 mg/dL (ref 8.7–10.3)
Chloride: 103 mmol/L (ref 96–106)
Creatinine, Ser: 0.9 mg/dL (ref 0.57–1.00)
GFR calc Af Amer: 71 mL/min/{1.73_m2} (ref >59)
GFR calc non Af Amer: 62 mL/min/{1.73_m2} (ref >59)
Glucose: 106 mg/dL — ABNORMAL HIGH (ref 65–99)
Potassium: 3.8 mmol/L (ref 3.5–5.2)
Sodium: 140 mmol/L (ref 134–144)

## 2020-11-12 DIAGNOSIS — M25661 Stiffness of right knee, not elsewhere classified: Secondary | ICD-10-CM | POA: Diagnosis not present

## 2020-11-12 DIAGNOSIS — Z96651 Presence of right artificial knee joint: Secondary | ICD-10-CM | POA: Diagnosis not present

## 2020-11-14 NOTE — Progress Notes (Signed)
Pt has been made aware of normal result and verbalized understanding.  jw

## 2020-11-17 ENCOUNTER — Telehealth (HOSPITAL_COMMUNITY): Payer: Self-pay | Admitting: Emergency Medicine

## 2020-11-17 NOTE — Telephone Encounter (Signed)
Reaching out to patient to offer assistance regarding upcoming cardiac imaging study; pt verbalizes understanding of appt date/time, parking situation and where to check in, pre-test NPO status and medications ordered, and verified current allergies; name and call back number provided for further questions should they arise Stephanie Bond RN Navigator Cardiac Imaging [place] and Vascular [phone] office [phone] cell  Holding HCTZ Stephanie Newton

## 2020-11-18 ENCOUNTER — Other Ambulatory Visit: Payer: Self-pay

## 2020-11-18 ENCOUNTER — Ambulatory Visit (HOSPITAL_COMMUNITY)
Admission: RE | Admit: 2020-11-18 | Discharge: 2020-11-18 | Disposition: A | Discharge status: Home / Self Care | Payer: Medicare Other | Source: Ambulatory Visit | Attending: Interventional Cardiology | Admitting: Interventional Cardiology

## 2020-11-18 DIAGNOSIS — I2510 Atherosclerotic heart disease of native coronary artery without angina pectoris: Secondary | ICD-10-CM

## 2020-11-18 DIAGNOSIS — I2541 Coronary artery aneurysm: Secondary | ICD-10-CM | POA: Diagnosis not present

## 2020-11-18 MED ORDER — NITROGLYCERIN 0.4 MG SL SUBL
SUBLINGUAL_TABLET | SUBLINGUAL | Status: AC
  Filled 2020-11-18: qty 2

## 2020-11-18 MED ORDER — NITROGLYCERIN 0.4 MG SL SUBL
0.8000 mg | SUBLINGUAL_TABLET | Freq: Once | SUBLINGUAL | Status: AC
  Administered 2020-11-18: 0.8 mg via SUBLINGUAL

## 2020-11-18 MED ORDER — IOHEXOL 350 MG/ML SOLN
80.0000 mL | Freq: Once | INTRAVENOUS | Status: AC | PRN
  Administered 2020-11-18: 80 mL via INTRAVENOUS

## 2020-11-19 DIAGNOSIS — I10 Essential (primary) hypertension: Secondary | ICD-10-CM | POA: Diagnosis not present

## 2020-11-19 DIAGNOSIS — E7800 Pure hypercholesterolemia, unspecified: Secondary | ICD-10-CM | POA: Diagnosis not present

## 2020-11-19 DIAGNOSIS — M1712 Unilateral primary osteoarthritis, left knee: Secondary | ICD-10-CM | POA: Diagnosis not present

## 2020-11-19 DIAGNOSIS — G8929 Other chronic pain: Secondary | ICD-10-CM | POA: Diagnosis not present

## 2020-11-19 DIAGNOSIS — Z96651 Presence of right artificial knee joint: Secondary | ICD-10-CM | POA: Diagnosis not present

## 2020-11-19 DIAGNOSIS — M25661 Stiffness of right knee, not elsewhere classified: Secondary | ICD-10-CM | POA: Diagnosis not present

## 2020-11-25 DIAGNOSIS — Z96651 Presence of right artificial knee joint: Secondary | ICD-10-CM | POA: Diagnosis not present

## 2020-11-25 DIAGNOSIS — M2021 Hallux rigidus, right foot: Secondary | ICD-10-CM | POA: Diagnosis not present

## 2020-11-25 DIAGNOSIS — M79671 Pain in right foot: Secondary | ICD-10-CM | POA: Diagnosis not present

## 2020-11-25 DIAGNOSIS — M25661 Stiffness of right knee, not elsewhere classified: Secondary | ICD-10-CM | POA: Diagnosis not present

## 2020-11-27 DIAGNOSIS — Z96651 Presence of right artificial knee joint: Secondary | ICD-10-CM | POA: Diagnosis not present

## 2020-12-03 DIAGNOSIS — Z79899 Other long term (current) drug therapy: Secondary | ICD-10-CM | POA: Diagnosis not present

## 2020-12-03 DIAGNOSIS — I10 Essential (primary) hypertension: Secondary | ICD-10-CM | POA: Diagnosis not present

## 2020-12-03 DIAGNOSIS — R7303 Prediabetes: Secondary | ICD-10-CM | POA: Diagnosis not present

## 2020-12-07 DIAGNOSIS — J069 Acute upper respiratory infection, unspecified: Secondary | ICD-10-CM | POA: Diagnosis not present

## 2020-12-08 ENCOUNTER — Telehealth: Payer: Self-pay | Admitting: *Deleted

## 2020-12-08 NOTE — Telephone Encounter (Signed)
Informed pt of results. Pt verbalized understanding. 

## 2020-12-08 NOTE — Telephone Encounter (Signed)
-----   Message from Lyn Records, MD sent at 12/08/2020 10:34 AM EST ----- Please let Stephanie Newton know the aneurysm is slightly larger but not an actionable change.  We simply need to continue to follow over time.  We will decide when to do the next study.  Will probably be 18 months after the most recent.  We will discuss at her office visit.

## 2020-12-15 DIAGNOSIS — I10 Essential (primary) hypertension: Secondary | ICD-10-CM | POA: Diagnosis not present

## 2020-12-15 DIAGNOSIS — Z79899 Other long term (current) drug therapy: Secondary | ICD-10-CM | POA: Diagnosis not present

## 2020-12-15 DIAGNOSIS — R7309 Other abnormal glucose: Secondary | ICD-10-CM | POA: Diagnosis not present

## 2020-12-15 DIAGNOSIS — E7800 Pure hypercholesterolemia, unspecified: Secondary | ICD-10-CM | POA: Diagnosis not present

## 2020-12-23 DIAGNOSIS — M1712 Unilateral primary osteoarthritis, left knee: Secondary | ICD-10-CM | POA: Diagnosis not present

## 2020-12-23 DIAGNOSIS — E7800 Pure hypercholesterolemia, unspecified: Secondary | ICD-10-CM | POA: Diagnosis not present

## 2020-12-23 DIAGNOSIS — I10 Essential (primary) hypertension: Secondary | ICD-10-CM | POA: Diagnosis not present

## 2020-12-23 DIAGNOSIS — G8929 Other chronic pain: Secondary | ICD-10-CM | POA: Diagnosis not present

## 2021-01-20 DIAGNOSIS — I10 Essential (primary) hypertension: Secondary | ICD-10-CM | POA: Diagnosis not present

## 2021-01-20 DIAGNOSIS — G8929 Other chronic pain: Secondary | ICD-10-CM | POA: Diagnosis not present

## 2021-01-20 DIAGNOSIS — E7800 Pure hypercholesterolemia, unspecified: Secondary | ICD-10-CM | POA: Diagnosis not present

## 2021-01-20 DIAGNOSIS — M1712 Unilateral primary osteoarthritis, left knee: Secondary | ICD-10-CM | POA: Diagnosis not present

## 2021-01-26 NOTE — Progress Notes (Unsigned)
Cardiology Office Note:    Date:  01/27/2021   ID:  Stephanie Newton, DOB 1941-09-26, MRN [id number]  PCP:  Lajean Manes, MD  Cardiologist:  Sinclair Grooms, MD   Referring MD: Lajean Manes, MD   Chief Complaint  Patient presents with  . Coronary Artery Disease    History of Present Illness:    Stephanie Newton is a 80 y.o. female with a hx of hyperlipidemia, hypertension, tobacco use, NSTEMI,PSVT,and LM saccular aneurysm.  Stephanie Newton has had difficulty with physical activity due to bilateral knee discomfort and varying degrees of immobility after 2 knee operations without optimal results.  She has had rare episodes of very brief PSVT.  She denies anginal quality chest pain.  She has gained weight during the pandemic.  Past Medical History:  Diagnosis Date  . Hypercholesteremia   . Hypertension     Past Surgical History:  Procedure Laterality Date  . BREAST BIOPSY     bilateral   . EYE SURGERY     cataract bilateral   . HERNIA REPAIR  06/2000   INCISIONAL VENTRAL ; DR Nedra Hai   . KNEE ARTHROSCOPY Left 08/23/2017   dr Theda Sers   . LAPAROSCOPIC CHOLECYSTECTOMY  06/13/2000   DR Nedra Hai   . LEFT HEART CATH AND CORONARY ANGIOGRAPHY N/A 12/04/2018   Procedure: LEFT HEART CATH AND CORONARY ANGIOGRAPHY;  Surgeon: Belva Crome, MD;  Location: [facility];  Service: Cardiovascular;  Laterality: N/A;  . TOTAL KNEE ARTHROPLASTY Left 03/31/2018   Procedure: LEFT TOTAL KNEE ARTHROPLASTY;  Surgeon: Sydnee Cabal, MD;  Location: [facility];  Service: Orthopedics;  Laterality: Left;  with block  . TOTAL KNEE ARTHROPLASTY Right 04/30/2020   Procedure: RIGHT TOTAL KNEE ARTHROPLASTY;  Surgeon: Gaynelle Arabian, MD;  Location: [facility];  Service: Orthopedics;  Laterality: Right;  52min  . VAGINAL HYSTERECTOMY  age 34s    Current Medications: Current Meds  Medication Sig  . amLODipine (NORVASC) 2.5 MG tablet Take 2.5 mg by mouth daily.  Marland Kitchen estradiol (ESTRACE) 1 MG  tablet Take 1 mg by mouth daily.  . hydrochlorothiazide (HYDRODIURIL) 12.5 MG tablet Take 12.5 mg by mouth daily.   Marland Kitchen losartan (COZAAR) 50 MG tablet Take 50 mg by mouth every morning.  . methocarbamol (ROBAXIN) 500 MG tablet Take 1 tablet (500 mg total) by mouth every 6 (six) hours as needed for muscle spasms.  . metoprolol succinate (TOPROL-XL) 100 MG 24 hr tablet Take 100 mg by mouth every morning.  Marland Kitchen oxyCODONE (OXY IR/ROXICODONE) 5 MG immediate release tablet Take 1-2 tablets (5-10 mg total) by mouth every 6 (six) hours as needed for severe pain.  . simvastatin (ZOCOR) 20 MG tablet simvastatin 20 mg tablet  TAKE ONE TABLET BY MOUTH EVERYDAY AT BEDTIME     Allergies:   Shellfish allergy   Social History   Socioeconomic History  . Marital status: Married    Spouse name: Not on file  . Number of children: Not on file  . Years of education: Not on file  . Highest education level: Not on file  Occupational History  . Not on file  Tobacco Use  . Smoking status: Current Some Day Smoker    Years: 30.00    Types: Cigarettes  . Smokeless tobacco: Never Used  . Tobacco comment: 6 cigarettes a day  Vaping Use  . Vaping Use: Never used  Substance and Sexual Activity  . Alcohol use: Yes    Comment: occasionally   .  Drug use: No  . Sexual activity: Yes  Other Topics Concern  . Not on file  Social History Narrative  . Not on file   Social Determinants of Health   Financial Resource Strain: Not on file  Food Insecurity: Not on file  Transportation Needs: Not on file  Physical Activity: Not on file  Stress: Not on file  Social Connections: Not on file     Family History: The patient's family history is not on file.  ROS:   Please see the history of present illness.    Bilateral knee pain.  Right knee is the most recently operated and she has decreased range of motion and pain.  All other systems reviewed and are negative.  EKGs/Labs/Other Studies Reviewed:    The following  studies were reviewed today:  CORONARY CTA DECEMBER 2021 FINDINGS: Non-cardiac: See separate report from [facility].  No LA appendage thrombus. Pulmonary veins drain normally to the left atrium.  Calcium Score: 0 Agatston units.  Coronary Arteries: Right dominant with no anomalies  LM: No plaque or stenosis. There is a 12 x 10 x 8 mm saccular aneurysm (January 2020 measurements: 8.5 x 8 x 10 mm saccular) that arises from the left main at the trifurcation of the LAD/LCx/ramus. No thrombus noted in the aneurysm.  LAD system: No plaque or stenosis.   Circumflex system: Moderate ramus is present. No plaque or stenosis in the LCx system.  RCA system: No plaque or stenosis.  IMPRESSION: 1. Coronary artery calcium score 0 Agatston units, suggesting low risk for future cardiac events.  2.  No significant coronary disease noted.  3. Distal left main saccular aneurysm again noted, dimensions slightly larger than on the prior study. No thrombus in the aneurysm.  EKG:  EKG normal sinus rhythm.  Normal PR interval.  Small inferior Q waves in 3 and aVF.  Recent Labs: 04/22/2020: ALT 38 05/01/2020: Hemoglobin 11.9; Platelets 204 11/11/2020: BUN 13; Creatinine, Ser 0.90; Potassium 3.8; Sodium 140  Recent Lipid Panel    Component Value Date/Time   CHOL 193 12/03/2018 1447   TRIG 211 (H) 12/03/2018 1447   HDL 77 12/03/2018 1447   CHOLHDL 2.5 12/03/2018 1447   VLDL 42 (H) 12/03/2018 1447   LDLCALC 74 12/03/2018 1447    Physical Exam:    VS:  BP 104/64   Pulse 74   Ht 5\' 5"  (1.651 m)   Wt 169 lb 6.4 oz (76.8 kg)   SpO2 98%   BMI 28.19 kg/m     Wt Readings from Last 3 Encounters:  01/27/21 169 lb 6.4 oz (76.8 kg)  04/30/20 163 lb 9.3 oz (74.2 kg)  04/22/20 163 lb 8 oz (74.2 kg)     GEN: Appears well.  BMI less than 30.. No acute distress HEENT: Normal NECK: No JVD. LYMPHATICS: No lymphadenopathy CARDIAC: No murmur. RRR S4 gallop, but no  edema. VASCULAR:  Normal Pulses. No bruits. RESPIRATORY:  Clear to auscultation without rales, wheezing or rhonchi  ABDOMEN: Soft, non-tender, non-distended, No pulsatile mass, MUSCULOSKELETAL: No deformity  SKIN: Warm and dry NEUROLOGIC:  Alert and oriented x 3 PSYCHIATRIC:  Normal affect   ASSESSMENT:    1. Coronary artery aneurysm   2. PSVT (paroxysmal supraventricular tachycardia) ([place])   3. Essential hypertension   4. Mixed hyperlipidemia   5. Tobacco use   6. Educated about COVID-19 virus infection    PLAN:    In order of problems listed above:  1. CT scan, coronary  in the month prior to next year's office visit to follow the size of the saccular aneurysm in the distal left main.  There has been slight growth over the past year.  No accompanying symptoms.  An 81 mg/day is recommended. 2. No significant episodes.  No change in therapy. 3. After her most recent knee operation 9 months ago, her blood pressure medicines were discontinued for a while because of exceedingly low blood pressures.  She is now back on metoprolol succinate, losartan, and amlodipine.  Therapeutic regimen is aimed at preventing aneurysm growth. 4. Continue Zocor 20 mg/day.  LDL was 55 January 2022. 5. Smoking cessation is in effect. 6. Vaccinated, boosted, and practicing social distancing.  Continue preventive therapy relative to aneurysm expansion which includes blood pressure control, beta-blocker therapy, and ARB therapy.  Follow-up CT scan in 1 year given some growth in aneurysm size year-over-year.   Medication Adjustments/Labs and Tests Ordered: Current medicines are reviewed at length with the patient today.  Concerns regarding medicines are outlined above.  Orders Placed This Encounter  Procedures  . EKG 12-Lead   No orders of the defined types were placed in this encounter.   Patient Instructions  Medication Instructions:  Your physician recommends that you continue on your current  medications as directed. Please refer to the Current Medication list given to you today.  *If you need a refill on your cardiac medications before your next appointment, please call your pharmacy*   Lab Work: None If you have labs (blood work) drawn today and your tests are completely normal, you will receive your results only by: Marland Kitchen MyChart Message (if you have MyChart) OR . A paper copy in the mail If you have any lab test that is abnormal or we need to change your treatment, we will call you to review the results.   Testing/Procedures: Your physician recommends that you have a Coronary CT performed prior to seeing him back in 1 year.  I will contact you when it gets closer to review instructions.    Follow-Up: At [facility], you and your health needs are our priority.  As part of our continuing mission to provide you with exceptional heart care, we have created designated Provider Care Teams.  These Care Teams include your primary Cardiologist (physician) and Advanced Practice Providers (APPs -  Physician Assistants and Nurse Practitioners) who all work together to provide you with the care you need, when you need it.  We recommend signing up for the patient portal called "MyChart".  Sign up information is provided on this After Visit Summary.  MyChart is used to connect with patients for Virtual Visits (Telemedicine).  Patients are able to view lab/test results, encounter notes, upcoming appointments, etc.  Non-urgent messages can be sent to your provider as well.   To learn more about what you can do with MyChart, go to [email or web address].    Your next appointment:   1 year(s)  The format for your next appointment:   In Person  Provider:   You may see Sinclair Grooms, MD or one of the following Advanced Practice Providers on your designated Care Team:    Kathyrn Drown, NP    Other Instructions      Signed, Sinclair Grooms, MD  01/27/2021 4:48 PM    [place]

## 2021-01-27 ENCOUNTER — Other Ambulatory Visit: Payer: Self-pay

## 2021-01-27 ENCOUNTER — Ambulatory Visit (INDEPENDENT_AMBULATORY_CARE_PROVIDER_SITE_OTHER): Payer: Medicare Other | Admitting: Interventional Cardiology

## 2021-01-27 ENCOUNTER — Encounter: Payer: Self-pay | Admitting: Interventional Cardiology

## 2021-01-27 VITALS — BP 104/64 | HR 74 | Ht 65.0 in | Wt 169.4 lb

## 2021-01-27 DIAGNOSIS — E782 Mixed hyperlipidemia: Secondary | ICD-10-CM

## 2021-01-27 DIAGNOSIS — Z720 Tobacco use: Secondary | ICD-10-CM | POA: Diagnosis not present

## 2021-01-27 DIAGNOSIS — I2541 Coronary artery aneurysm: Secondary | ICD-10-CM

## 2021-01-27 DIAGNOSIS — I471 Supraventricular tachycardia: Secondary | ICD-10-CM

## 2021-01-27 DIAGNOSIS — Z7189 Other specified counseling: Secondary | ICD-10-CM | POA: Diagnosis not present

## 2021-01-27 DIAGNOSIS — I10 Essential (primary) hypertension: Secondary | ICD-10-CM | POA: Diagnosis not present

## 2021-01-27 NOTE — Patient Instructions (Signed)
Medication Instructions:  Your physician recommends that you continue on your current medications as directed. Please refer to the Current Medication list given to you today.  *If you need a refill on your cardiac medications before your next appointment, please call your pharmacy*   Lab Work: None If you have labs (blood work) drawn today and your tests are completely normal, you will receive your results only by: Marland Kitchen MyChart Message (if you have MyChart) OR . A paper copy in the mail If you have any lab test that is abnormal or we need to change your treatment, we will call you to review the results.   Testing/Procedures: Your physician recommends that you have a Coronary CT performed prior to seeing him back in 1 year.  I will contact you when it gets closer to review instructions.    Follow-Up: At [facility], you and your health needs are our priority.  As part of our continuing mission to provide you with exceptional heart care, we have created designated Provider Care Teams.  These Care Teams include your primary Cardiologist (physician) and Advanced Practice Providers (APPs -  Physician Assistants and Nurse Practitioners) who all work together to provide you with the care you need, when you need it.  We recommend signing up for the patient portal called "MyChart".  Sign up information is provided on this After Visit Summary.  MyChart is used to connect with patients for Virtual Visits (Telemedicine).  Patients are able to view lab/test results, encounter notes, upcoming appointments, etc.  Non-urgent messages can be sent to your provider as well.   To learn more about what you can do with MyChart, go to [email or web address].    Your next appointment:   1 year(s)  The format for your next appointment:   In Person  Provider:   You may see Sinclair Grooms, MD or one of the following Advanced Practice Providers on your designated Care Team:    Kathyrn Drown, NP    Other  Instructions

## 2021-02-13 DIAGNOSIS — G8929 Other chronic pain: Secondary | ICD-10-CM | POA: Diagnosis not present

## 2021-02-13 DIAGNOSIS — M1712 Unilateral primary osteoarthritis, left knee: Secondary | ICD-10-CM | POA: Diagnosis not present

## 2021-02-13 DIAGNOSIS — E7800 Pure hypercholesterolemia, unspecified: Secondary | ICD-10-CM | POA: Diagnosis not present

## 2021-02-13 DIAGNOSIS — I10 Essential (primary) hypertension: Secondary | ICD-10-CM | POA: Diagnosis not present

## 2021-03-02 ENCOUNTER — Ambulatory Visit (INDEPENDENT_AMBULATORY_CARE_PROVIDER_SITE_OTHER): Payer: Medicare Other | Admitting: Podiatry

## 2021-03-02 ENCOUNTER — Other Ambulatory Visit: Payer: Self-pay

## 2021-03-02 ENCOUNTER — Encounter: Payer: Self-pay | Admitting: Podiatry

## 2021-03-02 ENCOUNTER — Ambulatory Visit (INDEPENDENT_AMBULATORY_CARE_PROVIDER_SITE_OTHER): Payer: Medicare Other

## 2021-03-02 DIAGNOSIS — M779 Enthesopathy, unspecified: Secondary | ICD-10-CM | POA: Diagnosis not present

## 2021-03-02 DIAGNOSIS — M205X1 Other deformities of toe(s) (acquired), right foot: Secondary | ICD-10-CM

## 2021-03-02 DIAGNOSIS — M79671 Pain in right foot: Secondary | ICD-10-CM

## 2021-03-02 MED ORDER — TRIAMCINOLONE ACETONIDE 10 MG/ML IJ SUSP
10.0000 mg | Freq: Once | INTRAMUSCULAR | Status: AC
  Administered 2021-03-02: 10 mg

## 2021-03-03 ENCOUNTER — Other Ambulatory Visit: Payer: Self-pay | Admitting: Podiatry

## 2021-03-03 DIAGNOSIS — M205X1 Other deformities of toe(s) (acquired), right foot: Secondary | ICD-10-CM

## 2021-03-04 NOTE — Progress Notes (Signed)
Subjective:   Patient ID: Stephanie Newton, female   DOB: 80 y.o.   MRN: [id number]   HPI Patient presents stating she has had a lot of discomfort in the big toe joint right and states that we had done an injection in number of years ago which was beneficial and she knows she has arthritis but she is like at least for temporary to continue on this route.  Does smoke periodically but not significant and does like to be active   Review of Systems  All other systems reviewed and are negative.       Objective:  Physical Exam Vitals and nursing note reviewed.  Constitutional:      Appearance: She is well-developed.  Pulmonary:     Effort: Pulmonary effort is normal.  Musculoskeletal:        General: Normal range of motion.  Skin:    General: Skin is warm.  Neurological:     Mental Status: She is alert.     Neurovascular status found to be intact muscle strength was found to be adequate range of motion adequate.  Patient is found to have inflammation discomfort around the big toe joint right with fluid buildup and reduced range of motion with crepitus of the joint surface noted.  Patient has good digital perfusion well oriented x3     Assessment:  Significant hallux limitus rigidus condition right long-term with probable arthritic changes of the joint surface      Plan:  H&P condition reviewed discussed.  I have recommended injection but I do think ultimate joint replacement would be of benefit to her and something to consider.  She would like to go this route but she has had several other joints replaced and is still recovering and wants something temporary and I did educate her on joint replacement today.  I did sterile prep I injected the first MPJ 3 mg dexamethasone Kenalog 5 mg Xylocaine I advised on rigid bottom shoes and we will see her back when symptomatic and may require more aggressive treatment plan depending on response  X-rays indicate that there is significant  narrowing of the joint surface with flattening of the surface noted currently and spur formation

## 2021-04-01 DIAGNOSIS — H5203 Hypermetropia, bilateral: Secondary | ICD-10-CM | POA: Diagnosis not present

## 2021-04-01 DIAGNOSIS — Z961 Presence of intraocular lens: Secondary | ICD-10-CM | POA: Diagnosis not present

## 2021-04-01 DIAGNOSIS — H524 Presbyopia: Secondary | ICD-10-CM | POA: Diagnosis not present

## 2021-04-17 DIAGNOSIS — Z23 Encounter for immunization: Secondary | ICD-10-CM | POA: Diagnosis not present

## 2021-04-20 DIAGNOSIS — G8929 Other chronic pain: Secondary | ICD-10-CM | POA: Diagnosis not present

## 2021-04-20 DIAGNOSIS — M1712 Unilateral primary osteoarthritis, left knee: Secondary | ICD-10-CM | POA: Diagnosis not present

## 2021-04-20 DIAGNOSIS — I10 Essential (primary) hypertension: Secondary | ICD-10-CM | POA: Diagnosis not present

## 2021-04-20 DIAGNOSIS — E7800 Pure hypercholesterolemia, unspecified: Secondary | ICD-10-CM | POA: Diagnosis not present

## 2021-04-30 DIAGNOSIS — Z96651 Presence of right artificial knee joint: Secondary | ICD-10-CM | POA: Diagnosis not present

## 2021-05-29 ENCOUNTER — Other Ambulatory Visit: Payer: Medicare Other

## 2021-05-29 DIAGNOSIS — Z20822 Contact with and (suspected) exposure to covid-19: Secondary | ICD-10-CM | POA: Diagnosis not present

## 2021-05-29 DIAGNOSIS — R0981 Nasal congestion: Secondary | ICD-10-CM | POA: Diagnosis not present

## 2021-06-01 DIAGNOSIS — E7800 Pure hypercholesterolemia, unspecified: Secondary | ICD-10-CM | POA: Diagnosis not present

## 2021-06-01 DIAGNOSIS — G8929 Other chronic pain: Secondary | ICD-10-CM | POA: Diagnosis not present

## 2021-06-01 DIAGNOSIS — M1712 Unilateral primary osteoarthritis, left knee: Secondary | ICD-10-CM | POA: Diagnosis not present

## 2021-06-01 DIAGNOSIS — I10 Essential (primary) hypertension: Secondary | ICD-10-CM | POA: Diagnosis not present

## 2021-07-03 ENCOUNTER — Emergency Department (HOSPITAL_BASED_OUTPATIENT_CLINIC_OR_DEPARTMENT_OTHER)
Admission: EM | Admit: 2021-07-03 | Discharge: 2021-07-03 | Disposition: A | Discharge status: Home / Self Care | Payer: Medicare Other | Attending: Emergency Medicine | Admitting: Emergency Medicine

## 2021-07-03 ENCOUNTER — Other Ambulatory Visit: Payer: Self-pay

## 2021-07-03 ENCOUNTER — Encounter (HOSPITAL_BASED_OUTPATIENT_CLINIC_OR_DEPARTMENT_OTHER): Payer: Self-pay | Admitting: *Deleted

## 2021-07-03 DIAGNOSIS — Z87891 Personal history of nicotine dependence: Secondary | ICD-10-CM | POA: Insufficient documentation

## 2021-07-03 DIAGNOSIS — Z96653 Presence of artificial knee joint, bilateral: Secondary | ICD-10-CM | POA: Diagnosis not present

## 2021-07-03 DIAGNOSIS — R21 Rash and other nonspecific skin eruption: Secondary | ICD-10-CM | POA: Diagnosis not present

## 2021-07-03 DIAGNOSIS — Z79899 Other long term (current) drug therapy: Secondary | ICD-10-CM | POA: Diagnosis not present

## 2021-07-03 DIAGNOSIS — I10 Essential (primary) hypertension: Secondary | ICD-10-CM | POA: Diagnosis not present

## 2021-07-03 MED ORDER — PREDNISONE 10 MG (21) PO TBPK: ORAL_TABLET | Freq: Every day | ORAL | 0 refills | Status: DC

## 2021-07-03 NOTE — ED Provider Notes (Signed)
[facility] EMERGENCY DEPT Provider Note   CSN: [id number] Arrival date & time: 07/03/21  1211     History Chief Complaint  Patient presents with   Rash    Stephanie Newton is a 80 y.o. female.  She has been seen by a dermatologist for this condition.  They stated that they could biopsy it in order to get her an exact diagnosis, but it would not be worth the scar.  She states that she usually takes a course of prednisone and tolerates it well.  The history is provided by the patient.  Rash Location:  Leg Leg rash location:  R lower leg and L lower leg Quality: itchiness and redness   Severity:  Moderate Onset quality:  Sudden Duration:  1 day Timing:  Constant Progression:  Unchanged Chronicity:  Recurrent Context: not insect bite/sting, not plant contact and not sick contacts   Relieved by:  Nothing Worsened by:  Nothing Ineffective treatments:  Topical steroids Associated symptoms: no abdominal pain, no diarrhea, no fever, no headaches, no joint pain, no nausea, no shortness of breath, no sore throat, no tongue swelling and not vomiting       Past Medical History:  Diagnosis Date   Hypercholesteremia    Hypertension     Patient Active Problem List   Diagnosis Date Noted   Primary osteoarthritis of right knee 04/30/2020   Coronary artery aneurysm 12/05/2018   Benign essential HTN 12/05/2018   HLD (hyperlipidemia) 12/05/2018   Lung nodules 12/05/2018   Nicotine abuse 12/05/2018   Elevated troponin    Palpitations 12/03/2018   NSTEMI (non-ST elevated myocardial infarction) ([place]) 12/03/2018   S/P knee replacement 03/31/2018   Primary osteoarthritis of knee 03/06/2018    Past Surgical History:  Procedure Laterality Date   BREAST BIOPSY     bilateral    EYE SURGERY     cataract bilateral    HERNIA REPAIR  06/2000   INCISIONAL VENTRAL ; DR Nedra Hai    KNEE ARTHROSCOPY Left 08/23/2017   dr Theda Sers    LAPAROSCOPIC CHOLECYSTECTOMY   06/13/2000   DR DOUG [facility]    LEFT HEART CATH AND CORONARY ANGIOGRAPHY N/A 12/04/2018   Procedure: LEFT HEART CATH AND CORONARY ANGIOGRAPHY;  Surgeon: Belva Crome, MD;  Location: [facility];  Service: Cardiovascular;  Laterality: N/A;   TOTAL KNEE ARTHROPLASTY Left 03/31/2018   Procedure: LEFT TOTAL KNEE ARTHROPLASTY;  Surgeon: Sydnee Cabal, MD;  Location: [facility];  Service: Orthopedics;  Laterality: Left;  with block   TOTAL KNEE ARTHROPLASTY Right 04/30/2020   Procedure: RIGHT TOTAL KNEE ARTHROPLASTY;  Surgeon: Gaynelle Arabian, MD;  Location: [facility];  Service: Orthopedics;  Laterality: Right;  46mn   VAGINAL HYSTERECTOMY  age [age]s    OB History     Gravida  2   Para  2   Term      Preterm      AB      Living         SAB      IAB      Ectopic      Multiple      Live Births              History reviewed. No pertinent family history.  Social History   Tobacco Use   Smoking status: Former    Years: 30.00    Types: Cigarettes   Smokeless tobacco: Never   Tobacco comments:    6 cigarettes  a day  Vaping Use   Vaping Use: Never used  Substance Use Topics   Alcohol use: Yes    Comment: occasionally    Drug use: No    Home Medications Prior to Admission medications   Medication Sig Start Date End Date Taking? Authorizing Provider  amLODipine (NORVASC) 2.5 MG tablet Take 2.5 mg by mouth daily. 09/03/20  Yes [provider]  estradiol (ESTRACE) 1 MG tablet Take 1 mg by mouth daily.   Yes [provider]  hydrochlorothiazide (HYDRODIURIL) 12.5 MG tablet Take 12.5 mg by mouth daily.    Yes [provider]  losartan (COZAAR) 50 MG tablet Take 50 mg by mouth every morning. 07/28/20  Yes [provider]  metoprolol succinate (TOPROL-XL) 100 MG 24 hr tablet Take 100 mg by mouth every morning. 07/28/20  Yes [provider]  predniSONE (STERAPRED UNI-PAK 21 TAB) 10 MG (21) TBPK tablet Take by mouth daily. Take  6 tabs by mouth daily  for 2 days, then 5 tabs for 2 days, then 4 tabs for 2 days, then 3 tabs for 2 days, 2 tabs for 2 days, then 1 tab by mouth daily for 2 days 07/03/21  Yes Arnaldo Natal, MD  simvastatin (ZOCOR) 20 MG tablet simvastatin 20 mg tablet  TAKE ONE TABLET BY MOUTH EVERYDAY AT BEDTIME   Yes [provider]  methocarbamol (ROBAXIN) 500 MG tablet Take 1 tablet (500 mg total) by mouth every 6 (six) hours as needed for muscle spasms. 05/01/20   Edmisten, Kristie L, PA  oxyCODONE (OXY IR/ROXICODONE) 5 MG immediate release tablet Take 1-2 tablets (5-10 mg total) by mouth every 6 (six) hours as needed for severe pain. 05/01/20   Edmisten, Ok Anis, PA    Allergies    Shellfish allergy  Review of Systems   Review of Systems  Constitutional:  Negative for chills and fever.  HENT:  Negative for ear pain and sore throat.   Eyes:  Negative for pain and visual disturbance.  Respiratory:  Negative for cough and shortness of breath.   Cardiovascular:  Negative for chest pain and palpitations.  Gastrointestinal:  Negative for abdominal pain, diarrhea, nausea and vomiting.  Genitourinary:  Negative for dysuria and hematuria.  Musculoskeletal:  Negative for arthralgias and back pain.  Skin:  Positive for rash. Negative for color change.  Neurological:  Negative for seizures, syncope and headaches.  All other systems reviewed and are negative.  Physical Exam Updated Vital Signs BP 115/63 (BP Location: Right Arm)   Pulse 80   Temp 98.4 F (36.9 C) (Oral)   Resp 16   Ht '5\' 5"'$  (1.651 m)   Wt 72.6 kg   SpO2 98%   BMI 26.63 kg/m   Physical Exam Vitals and nursing note reviewed.  HENT:     Head: Normocephalic and atraumatic.  Eyes:     General: No scleral icterus. Pulmonary:     Effort: Pulmonary effort is normal. No respiratory distress.  Musculoskeletal:     Cervical back: Normal range of motion.  Skin:    General: Skin is warm and dry.     Comments: Erythematous,  maculopapular rash on the anterior tibial surfaces bilaterally.  Neurological:     General: No focal deficit present.     Mental Status: She is alert and oriented to person, place, and time.  Psychiatric:        Mood and Affect: Mood normal.    ED Results / Procedures / Treatments  Labs (all labs ordered are listed, but only abnormal results are displayed) Labs Reviewed - No data to display  EKG None  Radiology No results found.  Procedures Procedures   Medications Ordered in ED Medications - No data to display  ED Course  I have reviewed the triage vital signs and the nursing notes.  Pertinent labs & imaging results that were available during my care of the patient were reviewed by me and considered in my medical decision making (see chart for details).    MDM Rules/Calculators/A&P                           Possible erythema nodosum versus other inflammatory condition.  Patient was given her usual course of prednisone.  She was counseled on return precautions. Final Clinical Impression(s) / ED Diagnoses Final diagnoses:  Rash    Rx / DC Orders ED Discharge Orders          Ordered    predniSONE (STERAPRED UNI-PAK 21 TAB) 10 MG (21) TBPK tablet  Daily        07/03/21 1317             Arnaldo Natal, MD 07/03/21 1322

## 2021-07-03 NOTE — ED Triage Notes (Signed)
Patient noticed the rashes to bilateral lower extremities last night.  Pt stated that it itches and she had it before and was prescribed with Prednisone.

## 2021-07-03 NOTE — ED Notes (Signed)
Discharge instructions discussed with pt. Pt verbalized understanding. Pt stable and ambulatory.  °

## 2021-07-16 ENCOUNTER — Other Ambulatory Visit: Payer: Self-pay

## 2021-07-16 ENCOUNTER — Ambulatory Visit (INDEPENDENT_AMBULATORY_CARE_PROVIDER_SITE_OTHER): Payer: Medicare Other | Admitting: Podiatry

## 2021-07-16 DIAGNOSIS — M779 Enthesopathy, unspecified: Secondary | ICD-10-CM

## 2021-07-16 MED ORDER — TRIAMCINOLONE ACETONIDE 10 MG/ML IJ SUSP
10.0000 mg | Freq: Once | INTRAMUSCULAR | Status: AC
  Administered 2021-07-16: 10 mg

## 2021-07-16 NOTE — Progress Notes (Signed)
Subjective:   Patient ID: Stephanie Newton, female   DOB: 80 y.o.   MRN: [id number]   HPI Patient presents stating that I am getting pain around the big toe joint again and I had about 4 months of relief   ROS      Objective:  Physical Exam  Neurovascular status intact Limited motion first MPJ right with probability for arthritis of the joint with inflammation around the joint surface     Assessment:  Hallux limitus with inflammatory capsulitis right     Plan:  Discussed long-term this will probably require joint implantation but she like to avoid that as long as possible did a sterile prep injected periarticular first MPJ 3 mg Kenalog 5 mg Xylocaine discussed rigid bottom shoes to wear and reviewed this with patient again.  Patient will be seen back to recheck

## 2021-07-17 DIAGNOSIS — I10 Essential (primary) hypertension: Secondary | ICD-10-CM | POA: Diagnosis not present

## 2021-07-17 DIAGNOSIS — I2541 Coronary artery aneurysm: Secondary | ICD-10-CM | POA: Diagnosis not present

## 2021-07-17 DIAGNOSIS — M25561 Pain in right knee: Secondary | ICD-10-CM | POA: Diagnosis not present

## 2021-07-17 DIAGNOSIS — I471 Supraventricular tachycardia: Secondary | ICD-10-CM | POA: Diagnosis not present

## 2021-07-17 DIAGNOSIS — Z79899 Other long term (current) drug therapy: Secondary | ICD-10-CM | POA: Diagnosis not present

## 2021-07-17 DIAGNOSIS — R918 Other nonspecific abnormal finding of lung field: Secondary | ICD-10-CM | POA: Diagnosis not present

## 2021-07-17 DIAGNOSIS — M25562 Pain in left knee: Secondary | ICD-10-CM | POA: Diagnosis not present

## 2021-07-17 DIAGNOSIS — G8929 Other chronic pain: Secondary | ICD-10-CM | POA: Diagnosis not present

## 2021-07-20 ENCOUNTER — Other Ambulatory Visit: Payer: Self-pay | Admitting: Geriatric Medicine

## 2021-07-20 DIAGNOSIS — R918 Other nonspecific abnormal finding of lung field: Secondary | ICD-10-CM

## 2021-07-25 ENCOUNTER — Ambulatory Visit
Admission: RE | Admit: 2021-07-25 | Discharge: 2021-07-25 | Disposition: A | Discharge status: Home / Self Care | Payer: Medicare Other | Source: Ambulatory Visit | Attending: Geriatric Medicine | Admitting: Geriatric Medicine

## 2021-07-25 DIAGNOSIS — R918 Other nonspecific abnormal finding of lung field: Secondary | ICD-10-CM | POA: Diagnosis not present

## 2021-07-31 DIAGNOSIS — D3502 Benign neoplasm of left adrenal gland: Secondary | ICD-10-CM | POA: Diagnosis not present

## 2021-08-03 DIAGNOSIS — D3502 Benign neoplasm of left adrenal gland: Secondary | ICD-10-CM | POA: Diagnosis not present

## 2021-08-20 DIAGNOSIS — Z79899 Other long term (current) drug therapy: Secondary | ICD-10-CM | POA: Diagnosis not present

## 2021-09-06 ENCOUNTER — Encounter (HOSPITAL_COMMUNITY): Payer: Self-pay | Admitting: *Deleted

## 2021-09-06 ENCOUNTER — Emergency Department (HOSPITAL_COMMUNITY): Payer: Medicare Other

## 2021-09-06 ENCOUNTER — Emergency Department (HOSPITAL_COMMUNITY)
Admission: EM | Admit: 2021-09-06 | Discharge: 2021-09-06 | Disposition: A | Discharge status: Home / Self Care | Payer: Medicare Other | Attending: Emergency Medicine | Admitting: Emergency Medicine

## 2021-09-06 ENCOUNTER — Other Ambulatory Visit: Payer: Self-pay

## 2021-09-06 DIAGNOSIS — R110 Nausea: Secondary | ICD-10-CM | POA: Diagnosis not present

## 2021-09-06 DIAGNOSIS — R231 Pallor: Secondary | ICD-10-CM | POA: Diagnosis not present

## 2021-09-06 DIAGNOSIS — I252 Old myocardial infarction: Secondary | ICD-10-CM | POA: Diagnosis not present

## 2021-09-06 DIAGNOSIS — I471 Supraventricular tachycardia: Secondary | ICD-10-CM | POA: Diagnosis not present

## 2021-09-06 DIAGNOSIS — Z79899 Other long term (current) drug therapy: Secondary | ICD-10-CM | POA: Diagnosis not present

## 2021-09-06 DIAGNOSIS — I10 Essential (primary) hypertension: Secondary | ICD-10-CM | POA: Diagnosis not present

## 2021-09-06 DIAGNOSIS — R000 Tachycardia, unspecified: Secondary | ICD-10-CM | POA: Diagnosis not present

## 2021-09-06 DIAGNOSIS — R42 Dizziness and giddiness: Secondary | ICD-10-CM | POA: Diagnosis not present

## 2021-09-06 DIAGNOSIS — Z87891 Personal history of nicotine dependence: Secondary | ICD-10-CM | POA: Insufficient documentation

## 2021-09-06 DIAGNOSIS — R002 Palpitations: Secondary | ICD-10-CM | POA: Diagnosis not present

## 2021-09-06 LAB — CBC WITH DIFFERENTIAL/PLATELET
Abs Immature Granulocytes: 0.01 10*3/uL (ref 0.00–0.07)
Basophils Absolute: 0 10*3/uL (ref 0.0–0.1)
Basophils Relative: 1 %
Eosinophils Absolute: 0.1 10*3/uL (ref 0.0–0.5)
Eosinophils Relative: 2 %
HCT: 40.7 % (ref 36.0–46.0)
Hemoglobin: 13.7 g/dL (ref 12.0–15.0)
Immature Granulocytes: 0 %
Lymphocytes Relative: 37 %
Lymphs Abs: 1.4 10*3/uL (ref 0.7–4.0)
MCH: 30.6 pg (ref 26.0–34.0)
MCHC: 33.7 g/dL (ref 30.0–36.0)
MCV: 90.8 fL (ref 80.0–100.0)
Monocytes Absolute: 0.4 10*3/uL (ref 0.1–1.0)
Monocytes Relative: 11 %
Neutro Abs: 1.8 10*3/uL (ref 1.7–7.7)
Neutrophils Relative %: 49 %
Platelets: 221 10*3/uL (ref 150–400)
RBC: 4.48 MIL/uL (ref 3.87–5.11)
RDW: 13.4 % (ref 11.5–15.5)
WBC: 3.8 10*3/uL — ABNORMAL LOW (ref 4.0–10.5)
nRBC: 0 % (ref 0.0–0.2)

## 2021-09-06 LAB — TSH: TSH: 3.346 u[IU]/mL (ref 0.350–4.500)

## 2021-09-06 LAB — MAGNESIUM: Magnesium: 1.8 mg/dL (ref 1.7–2.4)

## 2021-09-06 LAB — BASIC METABOLIC PANEL
Anion gap: 8 (ref 5–15)
BUN: 10 mg/dL (ref 8–23)
CO2: 24 mmol/L (ref 22–32)
Calcium: 9.5 mg/dL (ref 8.9–10.3)
Chloride: 106 mmol/L (ref 98–111)
Creatinine, Ser: 0.93 mg/dL (ref 0.44–1.00)
GFR, Estimated: >60 mL/min (ref >60)
Glucose, Bld: 111 mg/dL — ABNORMAL HIGH (ref 70–99)
Potassium: 3.4 mmol/L — ABNORMAL LOW (ref 3.5–5.1)
Sodium: 138 mmol/L (ref 135–145)

## 2021-09-06 NOTE — ED Provider Notes (Signed)
[facility] EMERGENCY DEPARTMENT Provider Note   CSN: [id number] Arrival date & time: 09/06/21  0101     History Chief Complaint  Patient presents with   svt   Tachycardia    Stephanie Newton is a 80 y.o. female.  HPI     This is a 80 year old female with a history of hypertension, hyperlipidemia, SVT who presents with palpitations.  Patient reports that she had an episode of palpitations at home that lasted greater than 15 minutes.  She has a history of SVT but states that generally her episodes only last for 10 to 15 seconds.  She experiences palpitations.  No chest pain or shortness of breath.  She states over the last month she has had more frequent episodes of palpitations.  She denies any recent illnesses or changes in diet.  Denies recent increased caffeine use.  Currently she states she "feels like I am having an episode."  Patient was noted to be in SVT by EMS.  She was given a 500 cc fluid bolus.  Past Medical History:  Diagnosis Date   Hypercholesteremia    Hypertension     Patient Active Problem List   Diagnosis Date Noted   Primary osteoarthritis of right knee 04/30/2020   Coronary artery aneurysm 12/05/2018   Benign essential HTN 12/05/2018   HLD (hyperlipidemia) 12/05/2018   Lung nodules 12/05/2018   Nicotine abuse 12/05/2018   Elevated troponin    Palpitations 12/03/2018   NSTEMI (non-ST elevated myocardial infarction) ([place]) 12/03/2018   S/P knee replacement 03/31/2018   Primary osteoarthritis of knee 03/06/2018    Past Surgical History:  Procedure Laterality Date   BREAST BIOPSY     bilateral    EYE SURGERY     cataract bilateral    HERNIA REPAIR  06/2000   INCISIONAL VENTRAL ; DR Nedra Hai    KNEE ARTHROSCOPY Left 08/23/2017   dr Theda Sers    LAPAROSCOPIC CHOLECYSTECTOMY  06/13/2000   DR DOUG [facility]    LEFT HEART CATH AND CORONARY ANGIOGRAPHY N/A 12/04/2018   Procedure: LEFT HEART CATH AND CORONARY ANGIOGRAPHY;   Surgeon: Belva Crome, MD;  Location: [facility];  Service: Cardiovascular;  Laterality: N/A;   TOTAL KNEE ARTHROPLASTY Left 03/31/2018   Procedure: LEFT TOTAL KNEE ARTHROPLASTY;  Surgeon: Sydnee Cabal, MD;  Location: [facility];  Service: Orthopedics;  Laterality: Left;  with block   TOTAL KNEE ARTHROPLASTY Right 04/30/2020   Procedure: RIGHT TOTAL KNEE ARTHROPLASTY;  Surgeon: Gaynelle Arabian, MD;  Location: [facility];  Service: Orthopedics;  Laterality: Right;  42min   VAGINAL HYSTERECTOMY  age 89s     OB History     Gravida  2   Para  2   Term      Preterm      AB      Living         SAB      IAB      Ectopic      Multiple      Live Births              No family history on file.  Social History   Tobacco Use   Smoking status: Former    Years: 30.00    Types: Cigarettes   Smokeless tobacco: Never   Tobacco comments:    6 cigarettes a day  Vaping Use   Vaping Use: Never used  Substance Use Topics   Alcohol use: Yes  Comment: occasionally    Drug use: No    Home Medications Prior to Admission medications   Medication Sig Start Date End Date Taking? Authorizing Provider  amLODipine (NORVASC) 2.5 MG tablet Take 2.5 mg by mouth daily. 09/03/20  Yes [provider]  estradiol (ESTRACE) 1 MG tablet Take 1 mg by mouth daily.   Yes [provider]  hydrochlorothiazide (HYDRODIURIL) 12.5 MG tablet Take 12.5 mg by mouth daily.    Yes [provider]  HYDROcodone-acetaminophen (NORCO/VICODIN) 5-325 MG tablet Take 1 tablet by mouth daily as needed for moderate pain. 05/15/21  Yes [provider]  losartan (COZAAR) 50 MG tablet Take 50 mg by mouth every morning. 07/28/20  Yes [provider]  methocarbamol (ROBAXIN) 500 MG tablet Take 1 tablet (500 mg total) by mouth every 6 (six) hours as needed for muscle spasms. 05/01/20  Yes Edmisten, Kristie L, PA  metoprolol succinate (TOPROL-XL) 100 MG 24 hr tablet Take 100 mg  by mouth every morning. 07/28/20  Yes [provider]  simvastatin (ZOCOR) 20 MG tablet Take 20 mg by mouth at bedtime.   Yes [provider]  oxyCODONE (OXY IR/ROXICODONE) 5 MG immediate release tablet Take 1-2 tablets (5-10 mg total) by mouth every 6 (six) hours as needed for severe pain. Patient not taking: Reported on 09/06/2021 05/01/20   Edmisten, Drue Dun L, PA  predniSONE (STERAPRED UNI-PAK 21 TAB) 10 MG (21) TBPK tablet Take by mouth daily. Take 6 tabs by mouth daily  for 2 days, then 5 tabs for 2 days, then 4 tabs for 2 days, then 3 tabs for 2 days, 2 tabs for 2 days, then 1 tab by mouth daily for 2 days Patient not taking: No sig reported 07/03/21   Arnaldo Natal, MD    Allergies    Shellfish allergy  Review of Systems   Review of Systems  Constitutional:  Negative for fever.  Respiratory:  Negative for shortness of breath.   Cardiovascular:  Positive for palpitations. Negative for chest pain and leg swelling.  Gastrointestinal:  Negative for abdominal pain, nausea and vomiting.  Genitourinary:  Negative for dysuria.  All other systems reviewed and are negative.  Physical Exam Updated Vital Signs BP 105/69   Pulse 80   Temp 97.9 F (36.6 C) (Oral)   Resp 19   Ht 1.651 m (5\' 5" )   Wt 72.6 kg   SpO2 97%   BMI 26.63 kg/m   Physical Exam Vitals and nursing note reviewed.  Constitutional:      Appearance: She is well-developed. She is not ill-appearing.  HENT:     Head: Normocephalic and atraumatic.     Nose: Nose normal.     Mouth/Throat:     Mouth: Mucous membranes are moist.  Eyes:     Pupils: Pupils are equal, round, and reactive to light.  Cardiovascular:     Rate and Rhythm: Normal rate and regular rhythm.     Heart sounds: Normal heart sounds.  Pulmonary:     Effort: Pulmonary effort is normal. No respiratory distress.     Breath sounds: No wheezing.  Abdominal:     Palpations: Abdomen is soft.     Tenderness: There is no abdominal  tenderness.  Musculoskeletal:     Cervical back: Neck supple.     Right lower leg: No edema.     Left lower leg: No edema.  Skin:    General: Skin is warm and dry.  Neurological:  Mental Status: She is alert and oriented to person, place, and time.  Psychiatric:        Mood and Affect: Mood normal.    ED Results / Procedures / Treatments   Labs (all labs ordered are listed, but only abnormal results are displayed) Labs Reviewed  CBC WITH DIFFERENTIAL/PLATELET - Abnormal; Notable for the following components:      Result Value   WBC 3.8 (*)    All other components within normal limits  BASIC METABOLIC PANEL - Abnormal; Notable for the following components:   Potassium 3.4 (*)    Glucose, Bld 111 (*)    All other components within normal limits  MAGNESIUM  TSH    EKG EKG Interpretation  Date/Time:  Sunday September 06 2021 01:14:50 EDT Ventricular Rate:  78 PR Interval:  187 QRS Duration: 93 QT Interval:  381 QTC Calculation: 434 R Axis:   55 Text Interpretation: Sinus rhythm Low voltage, precordial leads Confirmed by Thayer Jew [phone]) on 09/06/2021 1:25:55 AM  Radiology DG Chest Portable 1 View  Result Date: 09/06/2021 CLINICAL DATA:  Cardiac palpitations EXAM: PORTABLE CHEST 1 VIEW COMPARISON:  12/03/2018, CT from 07/25/2021 FINDINGS: Cardiac shadow is stable. Lungs are well aerated bilaterally. No focal infiltrate or sizable effusion is noted. No bony abnormality is seen. IMPRESSION: No active disease. Electronically Signed   By: Inez Catalina M.D.   On: 09/06/2021 02:13    Procedures Procedures   Medications Ordered in ED Medications - No data to display  ED Course  I have reviewed the triage vital signs and the nursing notes.  Pertinent labs & imaging results that were available during my care of the patient were reviewed by me and considered in my medical decision making (see chart for details).  Clinical Course as of 09/06/21 0300  Sun Sep 06, 2021   0258 Cardiology fellow, Marcelle Smiling.  He recommends follow-up with Dr. Tamala Julian.  No recommendations for medication adjustment at this time. [CH]    Clinical Course User Index [CH] Raj Landress, Barbette Hair, MD   MDM Rules/Calculators/A&P                           Patient presents with palpitations.  History of PSVT.  Previously has had rare intermittent episodes that have lasted seconds.  Now endorses frequent episodes that are longer in duration.  She is currently in sinus rhythm.  Per EMS she was in SVT.  This self aborted.  She reports compliance with her metoprolol.  Denies any recent illnesses or changes in diet.  EKG shows normal sinus rhythm.  No evidence of acute ischemia or arrhythmia.  Labs obtained.  Metabolites reassured including potassium and magnesium.  Remained on cardiac monitoring without a recurrent event or obvious episode of SVT.  See clinical course above.  Recommend close follow-up with Dr. Tamala Julian.  After history, exam, and medical workup I feel the patient has been appropriately medically screened and is safe for discharge home. Pertinent diagnoses were discussed with the patient. Patient was given return precautions.  Final Clinical Impression(s) / ED Diagnoses Final diagnoses:  PSVT (paroxysmal supraventricular tachycardia) ([place])    Rx / DC Orders ED Discharge Orders     None        Malessa Zartman, Barbette Hair, MD 09/06/21 0302

## 2021-09-06 NOTE — Discharge Instructions (Addendum)
Resent today again after recurrent episode of PSVT.  Follow-up closely with Dr. Tamala Julian for medication adjustments.  If you have any new or worsening symptoms you should be reevaluated.  Avoid significant caffeine or stimulant use.

## 2021-09-06 NOTE — ED Triage Notes (Signed)
PT here via [facility] from home c/o 1 5 min episode of svt yesterday and 2 episodes today, the last episode lasting 10 minutes.  Hx of same.  Givne 500 ns by ems.

## 2021-09-08 ENCOUNTER — Encounter (HOSPITAL_BASED_OUTPATIENT_CLINIC_OR_DEPARTMENT_OTHER): Payer: Self-pay | Admitting: *Deleted

## 2021-09-08 ENCOUNTER — Emergency Department (HOSPITAL_BASED_OUTPATIENT_CLINIC_OR_DEPARTMENT_OTHER): Payer: Medicare Other | Admitting: Radiology

## 2021-09-08 ENCOUNTER — Emergency Department (HOSPITAL_BASED_OUTPATIENT_CLINIC_OR_DEPARTMENT_OTHER)
Admission: EM | Admit: 2021-09-08 | Discharge: 2021-09-08 | Disposition: A | Discharge status: Home / Self Care | Payer: Medicare Other | Attending: Emergency Medicine | Admitting: Emergency Medicine

## 2021-09-08 ENCOUNTER — Other Ambulatory Visit: Payer: Self-pay

## 2021-09-08 DIAGNOSIS — Z96653 Presence of artificial knee joint, bilateral: Secondary | ICD-10-CM | POA: Insufficient documentation

## 2021-09-08 DIAGNOSIS — Z87891 Personal history of nicotine dependence: Secondary | ICD-10-CM | POA: Insufficient documentation

## 2021-09-08 DIAGNOSIS — Z79899 Other long term (current) drug therapy: Secondary | ICD-10-CM | POA: Insufficient documentation

## 2021-09-08 DIAGNOSIS — I10 Essential (primary) hypertension: Secondary | ICD-10-CM | POA: Diagnosis not present

## 2021-09-08 DIAGNOSIS — R519 Headache, unspecified: Secondary | ICD-10-CM | POA: Diagnosis present

## 2021-09-08 DIAGNOSIS — I471 Supraventricular tachycardia: Secondary | ICD-10-CM | POA: Insufficient documentation

## 2021-09-08 DIAGNOSIS — Z955 Presence of coronary angioplasty implant and graft: Secondary | ICD-10-CM | POA: Diagnosis not present

## 2021-09-08 DIAGNOSIS — R079 Chest pain, unspecified: Secondary | ICD-10-CM | POA: Diagnosis not present

## 2021-09-08 LAB — BASIC METABOLIC PANEL
Anion gap: 11 (ref 5–15)
BUN: 8 mg/dL (ref 8–23)
CO2: 25 mmol/L (ref 22–32)
Calcium: 10.1 mg/dL (ref 8.9–10.3)
Chloride: 103 mmol/L (ref 98–111)
Creatinine, Ser: 0.89 mg/dL (ref 0.44–1.00)
GFR, Estimated: >60 mL/min (ref >60)
Glucose, Bld: 142 mg/dL — ABNORMAL HIGH (ref 70–99)
Potassium: 3.2 mmol/L — ABNORMAL LOW (ref 3.5–5.1)
Sodium: 139 mmol/L (ref 135–145)

## 2021-09-08 LAB — CBC
HCT: 41.2 % (ref 36.0–46.0)
Hemoglobin: 14.1 g/dL (ref 12.0–15.0)
MCH: 30.6 pg (ref 26.0–34.0)
MCHC: 34.2 g/dL (ref 30.0–36.0)
MCV: 89.4 fL (ref 80.0–100.0)
Platelets: 250 10*3/uL (ref 150–400)
RBC: 4.61 MIL/uL (ref 3.87–5.11)
RDW: 13.6 % (ref 11.5–15.5)
WBC: 3 10*3/uL — ABNORMAL LOW (ref 4.0–10.5)
nRBC: 0 % (ref 0.0–0.2)

## 2021-09-08 LAB — TROPONIN I (HIGH SENSITIVITY)
Troponin I (High Sensitivity): 3 ng/L (ref <18)
Troponin I (High Sensitivity): 3 ng/L (ref <18)

## 2021-09-08 MED ORDER — SODIUM CHLORIDE 0.9 % IV BOLUS
500.0000 mL | Freq: Once | INTRAVENOUS | Status: AC
  Administered 2021-09-08: 500 mL via INTRAVENOUS

## 2021-09-08 MED ORDER — POTASSIUM CHLORIDE CRYS ER 20 MEQ PO TBCR
40.0000 meq | EXTENDED_RELEASE_TABLET | Freq: Once | ORAL | Status: AC
  Administered 2021-09-08: 40 meq via ORAL
  Filled 2021-09-08: qty 2

## 2021-09-08 MED ORDER — METOPROLOL SUCCINATE ER 50 MG PO TB24: 150.0000 mg | ORAL_TABLET | Freq: Every day | ORAL | 0 refills | Status: DC

## 2021-09-08 NOTE — ED Triage Notes (Addendum)
Pt states she was having tachycardia this morning, seen Sat for SVT at [facility]. States she had brief episodes of tachycardia last night keeping her awake until about 5 am. In triage states she has a chest fullness, shob, light headedness

## 2021-09-08 NOTE — ED Provider Notes (Signed)
[facility] EMERGENCY DEPT Provider Note   CSN: [id number] Arrival date & time: 09/08/21  1552     History Chief Complaint  Patient presents with   Tachycardia    Stephanie Newton is a 80 y.o. female with PMHx HTN, hypercholesteremia, and PSVT who presents to the ED today with complaint of recurrent episodes of SVT.  Per chart review patient was seen in the ED 2 days ago for same.  She reports that her heart rate was in the 180s with EMS however when she was brought to the ED she had self converted.  Her work-up was negative at that time and cardiology was consulted without any further recommendations.  Recommended outpatient follow-up with her cardiologist Dr. Tamala Julian.  She reports that throughout the past couple of days she has had recurrent episodes lasting a couple of seconds here and there however earlier today she had another episode lasted about 10 minutes.  She states she attempted to check her blood pressure at that time as well as her heart rate however her blood pressure machine was unable to do so due to it being so high.  She called her daughter who brought her to the ED however states that it stopped after about 10 minutes.  She states currently she feels fatigued and has a slight headache which she states happens from time to time when she goes into SVT.  She denies any chest pain.  No other complaints.  The history is provided by the patient, medical records and a relative.      Past Medical History:  Diagnosis Date   Hypercholesteremia    Hypertension     Patient Active Problem List   Diagnosis Date Noted   Primary osteoarthritis of right knee 04/30/2020   Coronary artery aneurysm 12/05/2018   Benign essential HTN 12/05/2018   HLD (hyperlipidemia) 12/05/2018   Lung nodules 12/05/2018   Nicotine abuse 12/05/2018   Elevated troponin    Palpitations 12/03/2018   NSTEMI (non-ST elevated myocardial infarction) ([place]) 12/03/2018   S/P knee replacement  03/31/2018   Primary osteoarthritis of knee 03/06/2018    Past Surgical History:  Procedure Laterality Date   BREAST BIOPSY     bilateral    EYE SURGERY     cataract bilateral    HERNIA REPAIR  06/2000   INCISIONAL VENTRAL ; DR Nedra Hai    KNEE ARTHROSCOPY Left 08/23/2017   dr Theda Sers    LAPAROSCOPIC CHOLECYSTECTOMY  06/13/2000   DR DOUG [facility]    LEFT HEART CATH AND CORONARY ANGIOGRAPHY N/A 12/04/2018   Procedure: LEFT HEART CATH AND CORONARY ANGIOGRAPHY;  Surgeon: Belva Crome, MD;  Location: [facility] [place] [facility];  Service: Cardiovascular;  Laterality: N/A;   TOTAL KNEE ARTHROPLASTY Left 03/31/2018   Procedure: LEFT TOTAL KNEE ARTHROPLASTY;  Surgeon: Sydnee Cabal, MD;  Location: [facility];  Service: Orthopedics;  Laterality: Left;  with block   TOTAL KNEE ARTHROPLASTY Right 04/30/2020   Procedure: RIGHT TOTAL KNEE ARTHROPLASTY;  Surgeon: Gaynelle Arabian, MD;  Location: [facility];  Service: Orthopedics;  Laterality: Right;  85min   VAGINAL HYSTERECTOMY  age 43s     OB History     Gravida  2   Para  2   Term      Preterm      AB      Living         SAB      IAB      Ectopic  Multiple      Live Births              No family history on file.  Social History   Tobacco Use   Smoking status: Former    Years: 30.00    Types: Cigarettes   Smokeless tobacco: Never   Tobacco comments:    6 cigarettes a day  Vaping Use   Vaping Use: Never used  Substance Use Topics   Alcohol use: Yes    Comment: occasionally    Drug use: No    Home Medications Prior to Admission medications   Medication Sig Start Date End Date Taking? Authorizing Provider  metoprolol succinate (TOPROL XL) 50 MG 24 hr tablet Take 3 tablets (150 mg total) by mouth daily. 09/08/21 10/08/21 Yes Allyah Heather, PA-C  amLODipine (NORVASC) 2.5 MG tablet Take 2.5 mg by mouth daily. 09/03/20   [provider]  estradiol (ESTRACE) 1 MG tablet Take 1 mg by mouth daily.     [provider]  hydrochlorothiazide (HYDRODIURIL) 12.5 MG tablet Take 12.5 mg by mouth daily.     [provider]  HYDROcodone-acetaminophen (NORCO/VICODIN) 5-325 MG tablet Take 1 tablet by mouth daily as needed for moderate pain. 05/15/21   [provider]  losartan (COZAAR) 50 MG tablet Take 50 mg by mouth every morning. 07/28/20   [provider]  methocarbamol (ROBAXIN) 500 MG tablet Take 1 tablet (500 mg total) by mouth every 6 (six) hours as needed for muscle spasms. 05/01/20   Edmisten, Kristie L, PA  oxyCODONE (OXY IR/ROXICODONE) 5 MG immediate release tablet Take 1-2 tablets (5-10 mg total) by mouth every 6 (six) hours as needed for severe pain. Patient not taking: Reported on 09/06/2021 05/01/20   Edmisten, Drue Dun L, PA  predniSONE (STERAPRED UNI-PAK 21 TAB) 10 MG (21) TBPK tablet Take by mouth daily. Take 6 tabs by mouth daily  for 2 days, then 5 tabs for 2 days, then 4 tabs for 2 days, then 3 tabs for 2 days, 2 tabs for 2 days, then 1 tab by mouth daily for 2 days Patient not taking: No sig reported 07/03/21   Arnaldo Natal, MD  simvastatin (ZOCOR) 20 MG tablet Take 20 mg by mouth at bedtime.    [provider]    Allergies    Shellfish allergy  Review of Systems   Review of Systems  Constitutional:  Negative for chills and fever.  Respiratory:  Negative for shortness of breath.   Cardiovascular:  Positive for palpitations. Negative for chest pain and leg swelling.  Gastrointestinal:  Negative for nausea.  Neurological:  Positive for headaches.  All other systems reviewed and are negative.  Physical Exam Updated Vital Signs BP 123/77 (BP Location: Right Arm)   Pulse 60   Temp 98.1 F (36.7 C) (Oral)   Resp (!) 22   Ht 5\' 5"  (1.651 m)   Wt 77.1 kg   SpO2 99%   BMI 28.29 kg/m   Physical Exam Vitals and nursing note reviewed.  Constitutional:      Appearance: She is not ill-appearing or diaphoretic.  HENT:     Head:  Normocephalic and atraumatic.  Eyes:     Conjunctiva/sclera: Conjunctivae normal.  Cardiovascular:     Rate and Rhythm: Normal rate and regular rhythm.     Pulses: Normal pulses.  Pulmonary:     Effort: Pulmonary effort is normal.     Breath sounds: Normal breath sounds. No wheezing, rhonchi or  rales.  Chest:     Chest wall: No tenderness.  Abdominal:     Palpations: Abdomen is soft.     Tenderness: There is no abdominal tenderness.  Musculoskeletal:     Cervical back: Neck supple.  Skin:    General: Skin is warm and dry.  Neurological:     Mental Status: She is alert.    ED Results / Procedures / Treatments   Labs (all labs ordered are listed, but only abnormal results are displayed) Labs Reviewed  BASIC METABOLIC PANEL - Abnormal; Notable for the following components:      Result Value   Potassium 3.2 (*)    Glucose, Bld 142 (*)    All other components within normal limits  CBC - Abnormal; Notable for the following components:   WBC 3.0 (*)    All other components within normal limits  TROPONIN I (HIGH SENSITIVITY)  TROPONIN I (HIGH SENSITIVITY)    EKG EKG Interpretation  Date/Time:  Tuesday September 08 2021 16:15:19 EDT Ventricular Rate:  70 PR Interval:  176 QRS Duration: 80 QT Interval:  390 QTC Calculation: 421 R Axis:   68 Text Interpretation: Normal sinus rhythm Cannot rule out Anterior infarct , age undetermined Abnormal ECG No significant change since last tracing Confirmed by Deno Etienne [phone]) on 09/08/2021 5:33:56 PM  Radiology DG Chest 2 View  Result Date: 09/08/2021 CLINICAL DATA:  Chest pain EXAM: CHEST - 2 VIEW COMPARISON:  Chest x-ray dated September 26, 2021 FINDINGS: Cardiac and mediastinal contours are unchanged and within normal limits. Lungs are clear. No pleural effusion or pneumothorax. IMPRESSION: No active cardiopulmonary disease. Electronically Signed   By: Yetta Glassman M.D.   On: 09/08/2021 16:40    Procedures Procedures    Medications Ordered in ED Medications  sodium chloride 0.9 % bolus 500 mL (500 mLs Intravenous New Bag/Given 09/08/21 1832)  potassium chloride SA (KLOR-CON) CR tablet 40 mEq (40 mEq Oral Given 09/08/21 1828)    ED Course  I have reviewed the triage vital signs and the nursing notes.  Pertinent labs & imaging results that were available during my care of the patient were reviewed by me and considered in my medical decision making (see chart for details).    MDM Rules/Calculators/A&P                           80 year old female who presents to the ED today with complaint of SVT episode that lasted about 10 minutes earlier today.  History of same.  Compliant on metoprolol.  Seen in the ED 2 days ago for same however self converted prior to being seen.  On arrival to the ED today patient is in normal sinus rhythm with a heart rate of 72.  Remainder of vitals unremarkable.  Chest x-ray without any acute findings and initial troponin of 3.  We will plan for repeat troponin.  Her BMP does show a slightly lower potassium at 3.2 compared to 3.42 days ago.  We will plan to replete and provide a small amount of fluids as patient currently complaining of a slight headache and generalized weakness which she states happens when she goes into SVT.   Repeat troponin of 3.   Discussed case with cardiologist Dr. Marlou Porch who recommends increasing metoprolol from 100 mg to 150 mg and to discontinue losartan at this time. He will contact Dr. Tamala Julian to schedule a close outpatient follow up for patient vs EP consult.  Pt in agreement with plan. Stable for discharge.   This note was prepared using Dragon voice recognition software and may include unintentional dictation errors due to the inherent limitations of voice recognition software.  Final Clinical Impression(s) / ED Diagnoses Final diagnoses:  PSVT (paroxysmal supraventricular tachycardia) ([place])    Rx / DC Orders ED Discharge Orders          Ordered     metoprolol succinate (TOPROL XL) 50 MG 24 hr tablet  Daily        09/08/21 1954             Discharge Instructions      Please start taking the increased Metoprolol 150 mg dose tomorrow and discontinue the Losartan (Cozaar) 50 mg  Follow up with Dr. Tamala Julian cardiologist for further evaluation. If you have not heard form their office by Friday I would recommend calling to schedule an appointment  Return to the ED IMMEDIATELY for any new/worsening symptoms       Eustaquio Maize, PA-C 09/08/21 [place], [place], DO 09/08/21 2310

## 2021-09-08 NOTE — Discharge Instructions (Addendum)
Please start taking the increased Metoprolol 150 mg dose tomorrow and discontinue the Losartan (Cozaar) 50 mg  Follow up with Dr. Tamala Julian cardiologist for further evaluation. If you have not heard form their office by Friday I would recommend calling to schedule an appointment  Return to the ED IMMEDIATELY for any new/worsening symptoms

## 2021-09-08 NOTE — ED Notes (Signed)
This RN presented the AVS utilizing Teachback Method. Patient verbalizes understanding of Discharge Instructions. Opportunity for Questioning and Answers were provided. Patient Discharged from ED ambulatory to Home with Family  

## 2021-09-09 ENCOUNTER — Ambulatory Visit (INDEPENDENT_AMBULATORY_CARE_PROVIDER_SITE_OTHER): Payer: Medicare Other

## 2021-09-09 ENCOUNTER — Telehealth: Payer: Self-pay | Admitting: *Deleted

## 2021-09-09 DIAGNOSIS — I471 Supraventricular tachycardia: Secondary | ICD-10-CM

## 2021-09-09 NOTE — Progress Notes (Unsigned)
Patient enrolled for [employer] to mail a 7 day ZIO XT monitor to her address on file. 

## 2021-09-09 NOTE — Telephone Encounter (Signed)
Spoke with pt and reviewed recommendations per Dr. Tamala Julian.  Pt agreeable to plan.

## 2021-09-09 NOTE — Telephone Encounter (Signed)
-----   Message from Belva Crome, MD sent at 09/09/2021  2:03 PM EDT ----- Regarding: RE: Increased metoprolol Please have her to wear a 7-day monitor on the higher dose of metoprolol. ----- Message ----- From: Jerline Pain, MD Sent: 09/08/2021   7:44 PM EDT To: Belva Crome, MD Subject: Increased metoprolol                           Had more SVT like symptoms. Went back to ER  I asked them to increase metoprolol from 100 to 150 And stop losartan 50.  Thanks  -[employer]

## 2021-09-13 DIAGNOSIS — I471 Supraventricular tachycardia: Secondary | ICD-10-CM | POA: Diagnosis not present

## 2021-09-28 DIAGNOSIS — I471 Supraventricular tachycardia: Secondary | ICD-10-CM | POA: Diagnosis not present

## 2021-11-11 ENCOUNTER — Other Ambulatory Visit: Payer: Self-pay | Admitting: *Deleted

## 2021-11-11 DIAGNOSIS — I2541 Coronary artery aneurysm: Secondary | ICD-10-CM

## 2021-11-11 DIAGNOSIS — I2510 Atherosclerotic heart disease of native coronary artery without angina pectoris: Secondary | ICD-10-CM

## 2021-11-11 DIAGNOSIS — I671 Cerebral aneurysm, nonruptured: Secondary | ICD-10-CM

## 2021-11-19 ENCOUNTER — Other Ambulatory Visit: Payer: Self-pay

## 2021-11-19 ENCOUNTER — Other Ambulatory Visit: Payer: Medicare Other

## 2021-11-19 DIAGNOSIS — I2510 Atherosclerotic heart disease of native coronary artery without angina pectoris: Secondary | ICD-10-CM

## 2021-11-19 DIAGNOSIS — I2541 Coronary artery aneurysm: Secondary | ICD-10-CM | POA: Diagnosis not present

## 2021-11-19 DIAGNOSIS — I671 Cerebral aneurysm, nonruptured: Secondary | ICD-10-CM

## 2021-11-19 LAB — BASIC METABOLIC PANEL
BUN/Creatinine Ratio: 14 (ref 12–28)
BUN: 12 mg/dL (ref 8–27)
CO2: 23 mmol/L (ref 20–29)
Calcium: 9.8 mg/dL (ref 8.7–10.3)
Chloride: 98 mmol/L (ref 96–106)
Creatinine, Ser: 0.83 mg/dL (ref 0.57–1.00)
Glucose: 109 mg/dL — ABNORMAL HIGH (ref 70–99)
Potassium: 3.8 mmol/L (ref 3.5–5.2)
Sodium: 135 mmol/L (ref 134–144)
eGFR: 72 mL/min/{1.73_m2} (ref >59)

## 2021-11-23 ENCOUNTER — Telehealth (HOSPITAL_COMMUNITY): Payer: Self-pay | Admitting: Emergency Medicine

## 2021-11-23 NOTE — Telephone Encounter (Signed)
Reaching out to patient to offer assistance regarding upcoming cardiac imaging study; pt verbalizes understanding of appt date/time, parking situation and where to check in, pre-test NPO status and medications ordered, and verified current allergies; name and call back number provided for further questions should they arise Marchia Bond RN Navigator Cardiac Imaging [facility] [phone] office [phone] cell  Denies iv issues Arrival 245 Daily meds, holding HCTZ Annual CT for saccular aneurysm

## 2021-11-24 ENCOUNTER — Ambulatory Visit (HOSPITAL_COMMUNITY)
Admission: RE | Admit: 2021-11-24 | Discharge: 2021-11-24 | Disposition: A | Discharge status: Home / Self Care | Payer: Medicare Other | Source: Ambulatory Visit | Attending: Interventional Cardiology | Admitting: Interventional Cardiology

## 2021-11-24 ENCOUNTER — Other Ambulatory Visit: Payer: Self-pay

## 2021-11-24 DIAGNOSIS — I2510 Atherosclerotic heart disease of native coronary artery without angina pectoris: Secondary | ICD-10-CM | POA: Insufficient documentation

## 2021-11-24 DIAGNOSIS — I2541 Coronary artery aneurysm: Secondary | ICD-10-CM | POA: Diagnosis not present

## 2021-11-24 DIAGNOSIS — I671 Cerebral aneurysm, nonruptured: Secondary | ICD-10-CM | POA: Diagnosis not present

## 2021-11-24 MED ORDER — IOHEXOL 350 MG/ML SOLN
95.0000 mL | Freq: Once | INTRAVENOUS | Status: AC | PRN
  Administered 2021-11-24: 15:00:00 95 mL via INTRAVENOUS

## 2021-11-24 MED ORDER — NITROGLYCERIN 0.4 MG SL SUBL
SUBLINGUAL_TABLET | SUBLINGUAL | Status: AC
  Filled 2021-11-24: qty 2

## 2021-11-24 MED ORDER — NITROGLYCERIN 0.4 MG SL SUBL
0.8000 mg | SUBLINGUAL_TABLET | Freq: Once | SUBLINGUAL | Status: AC
  Administered 2021-11-24: 15:00:00 0.8 mg via SUBLINGUAL

## 2021-12-24 ENCOUNTER — Ambulatory Visit: Payer: Medicare Other | Admitting: Physician Assistant

## 2022-01-26 ENCOUNTER — Other Ambulatory Visit: Payer: Self-pay | Admitting: Geriatric Medicine

## 2022-01-26 DIAGNOSIS — I471 Supraventricular tachycardia: Secondary | ICD-10-CM | POA: Diagnosis not present

## 2022-01-26 DIAGNOSIS — K769 Liver disease, unspecified: Secondary | ICD-10-CM | POA: Diagnosis not present

## 2022-01-26 DIAGNOSIS — I10 Essential (primary) hypertension: Secondary | ICD-10-CM | POA: Diagnosis not present

## 2022-01-26 DIAGNOSIS — K2100 Gastro-esophageal reflux disease with esophagitis, without bleeding: Secondary | ICD-10-CM | POA: Diagnosis not present

## 2022-01-26 DIAGNOSIS — Z79899 Other long term (current) drug therapy: Secondary | ICD-10-CM | POA: Diagnosis not present

## 2022-01-26 DIAGNOSIS — R222 Localized swelling, mass and lump, trunk: Secondary | ICD-10-CM | POA: Diagnosis not present

## 2022-01-26 DIAGNOSIS — Z0000 Encounter for general adult medical examination without abnormal findings: Secondary | ICD-10-CM | POA: Diagnosis not present

## 2022-01-26 DIAGNOSIS — Z1231 Encounter for screening mammogram for malignant neoplasm of breast: Secondary | ICD-10-CM

## 2022-01-26 DIAGNOSIS — Z1331 Encounter for screening for depression: Secondary | ICD-10-CM | POA: Diagnosis not present

## 2022-01-26 DIAGNOSIS — E7800 Pure hypercholesterolemia, unspecified: Secondary | ICD-10-CM | POA: Diagnosis not present

## 2022-01-26 DIAGNOSIS — R7303 Prediabetes: Secondary | ICD-10-CM | POA: Diagnosis not present

## 2022-01-28 NOTE — Progress Notes (Signed)
Cardiology Office Note:    Date:  01/29/2022   ID:  Stephanie Newton, DOB 1941/04/10, MRN [id number]  PCP:  Lajean Manes, MD  Cardiologist:  Sinclair Grooms, MD   Referring MD: Lajean Manes, MD   Chief Complaint  Patient presents with   Coronary Artery Disease   Hypertension    History of Present Illness:    Stephanie Newton is a 81 y.o. female with a hx of  hyperlipidemia, primary hypertension, tobacco use, NSTEMI, PSVT, and LM saccular aneurysm.   Stephanie Newton is doing well.  She says that Dr. Felipa Eth found a lymph node in her left neck.  She denies any substantial episodes of PSVT.  She has not had angina or recurrent chest pain.  Discontinued tobacco use.  Denies shortness of breath.  No medication side effects.  No neurological symptoms.  Orthopnea PND.  Past Medical History:  Diagnosis Date   Hypercholesteremia    Hypertension     Past Surgical History:  Procedure Laterality Date   BREAST BIOPSY     bilateral    EYE SURGERY     cataract bilateral    HERNIA REPAIR  06/2000   INCISIONAL VENTRAL ; DR Nedra Hai    KNEE ARTHROSCOPY Left 08/23/2017   dr Theda Sers    LAPAROSCOPIC CHOLECYSTECTOMY  06/13/2000   DR DOUG [facility]    LEFT HEART CATH AND CORONARY ANGIOGRAPHY N/A 12/04/2018   Procedure: LEFT HEART CATH AND CORONARY ANGIOGRAPHY;  Surgeon: Belva Crome, MD;  Location: [place] [facility];  Service: Cardiovascular;  Laterality: N/A;   TOTAL KNEE ARTHROPLASTY Left 03/31/2018   Procedure: LEFT TOTAL KNEE ARTHROPLASTY;  Surgeon: Sydnee Cabal, MD;  Location: [facility];  Service: Orthopedics;  Laterality: Left;  with block   TOTAL KNEE ARTHROPLASTY Right 04/30/2020   Procedure: RIGHT TOTAL KNEE ARTHROPLASTY;  Surgeon: Gaynelle Arabian, MD;  Location: [facility];  Service: Orthopedics;  Laterality: Right;  27min   VAGINAL HYSTERECTOMY  age 21s    Current Medications: Current Meds  Medication Sig   amLODipine (NORVASC) 2.5 MG tablet Take 2.5 mg by mouth daily.    estradiol (ESTRACE) 1 MG tablet Take 1 mg by mouth daily.   famotidine (PEPCID) 20 MG tablet Take 20 mg by mouth as needed.   hydrochlorothiazide (HYDRODIURIL) 12.5 MG tablet Take 12.5 mg by mouth daily.    HYDROcodone-acetaminophen (NORCO/VICODIN) 5-325 MG tablet Take 1 tablet by mouth daily as needed for moderate pain.   losartan (COZAAR) 50 MG tablet Take 50 mg by mouth every morning.   methocarbamol (ROBAXIN) 500 MG tablet Take 1 tablet (500 mg total) by mouth every 6 (six) hours as needed for muscle spasms.   oxyCODONE (OXY IR/ROXICODONE) 5 MG immediate release tablet Take 1-2 tablets (5-10 mg total) by mouth every 6 (six) hours as needed for severe pain.   predniSONE (STERAPRED UNI-PAK 21 TAB) 10 MG (21) TBPK tablet Take by mouth daily. Take 6 tabs by mouth daily  for 2 days, then 5 tabs for 2 days, then 4 tabs for 2 days, then 3 tabs for 2 days, 2 tabs for 2 days, then 1 tab by mouth daily for 2 days   simvastatin (ZOCOR) 20 MG tablet Take 20 mg by mouth at bedtime.     Allergies:   Shellfish allergy   Social History   Socioeconomic History   Marital status: Married    Spouse name: Not on file   Number of children: Not on file  Years of education: Not on file   Highest education level: Not on file  Occupational History   Not on file  Tobacco Use   Smoking status: Former    Years: 30.00    Types: Cigarettes   Smokeless tobacco: Never   Tobacco comments:    6 cigarettes a day  Vaping Use   Vaping Use: Never used  Substance and Sexual Activity   Alcohol use: Yes    Comment: occasionally    Drug use: No   Sexual activity: Yes  Other Topics Concern   Not on file  Social History Narrative   Not on file   Social Determinants of Health   Financial Resource Strain: Not on file  Food Insecurity: Not on file  Transportation Needs: Not on file  Physical Activity: Not on file  Stress: Not on file  Social Connections: Not on file     Family History: The patient's family  history is not on file.  ROS:   Please see the history of present illness.    She is limited in walking and exertional activities because of bilateral knee arthritis.  Both knees have been replaced.  All other systems reviewed and are negative.  EKGs/Labs/Other Studies Reviewed:    The following studies were reviewed today:  CORONARY CT with FFR 11/24/2021:  IMPRESSION: 1. Coronary calcium score of 1. This was 30th percentile for age, sex, and race matched control.   2. Normal coronary origin with right dominance.   3. Distal left main saccular aneurysm again noted, dimensions slightly larger than on the prior study. No thrombus in the   aneurysm.   4. 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   EKG:  EKG not repeated  Recent Labs: 09/06/2021: Magnesium 1.8; TSH 3.346 09/08/2021: Hemoglobin 14.1; Platelets 250 11/19/2021: BUN 12; Creatinine, Ser 0.83; Potassium 3.8; Sodium 135  Recent Lipid Panel    Component Value Date/Time   CHOL 193 12/03/2018 1447   TRIG 211 (H) 12/03/2018 1447   HDL 77 12/03/2018 1447   CHOLHDL 2.5 12/03/2018 1447   VLDL 42 (H) 12/03/2018 1447   LDLCALC 74 12/03/2018 1447    Physical Exam:    VS:  BP 102/62    Pulse 67    Ht 5\' 5"  (1.651 m)    Wt 170 lb (77.1 kg)    SpO2 98%    BMI 28.29 kg/m     Wt Readings from Last 3 Encounters:  01/29/22 170 lb (77.1 kg)  09/08/21 170 lb (77.1 kg)  09/06/21 160 lb (72.6 kg)     GEN: Healthy appearing but overweight. No acute distress HEENT: Normal NECK: No JVD. LYMPHATICS: No lymphadenopathy CARDIAC: No murmur. RRR no gallop, or edema. VASCULAR:  Normal Pulses. No bruits. RESPIRATORY:  Clear to auscultation without rales, wheezing or rhonchi  ABDOMEN: Soft, non-tender, non-distended, No pulsatile mass, MUSCULOSKELETAL: No deformity  SKIN: Warm and dry NEUROLOGIC:  Alert and oriented x 3 PSYCHIATRIC:  Normal affect   ASSESSMENT:    1. CAD in native  artery   2. Coronary artery aneurysm   3. PSVT (paroxysmal supraventricular tachycardia) ([place])   4. Essential hypertension   5. Mixed hyperlipidemia   6. Tobacco use    PLAN:    In order of problems listed above:  Overall, doing well without angina.  Very little change in the size of the left main saccular aneurysm.  We will continue to use conservative measures for aneurysm disease including  blood pressure control, beta-blocker, and statin therapy.  CT serial follow-up with next anticipated coronary CTA with FFR in 2 years.  After age 39 we will stop following with imaging and simply manage her expectantly. Same as above.  Beta-blocker and losartan therapy. Continue high intensity beta-blocker therapy Continue beta-blocker, Cozaar, hydrochlorothiazide, and amlodipine. Continue Zocor therapy  Overall education and awareness concerning secondary risk prevention was discussed in detail: LDL less than 70, hemoglobin A1c less than 7, blood pressure target less than 130/80 mmHg, >150 minutes of moderate aerobic activity per week, avoidance of smoking, weight control (via diet and exercise), and continued surveillance/management of/for obstructive sleep apnea.   Medication Adjustments/Labs and Tests Ordered: Current medicines are reviewed at length with the patient today.  Concerns regarding medicines are outlined above.  No orders of the defined types were placed in this encounter.  No orders of the defined types were placed in this encounter.   Patient Instructions  Medication Instructions:  Your physician recommends that you continue on your current medications as directed. Please refer to the Current Medication list given to you today.  *If you need a refill on your cardiac medications before your next appointment, please call your pharmacy*   Lab Work: None If you have labs (blood work) drawn today and your tests are completely normal, you will receive your results only  by: [place] (if you have MyChart) OR A paper copy in the mail If you have any lab test that is abnormal or we need to change your treatment, we will call you to review the results.   Testing/Procedures: None   Follow-Up: At [facility], you and your health needs are our priority.  As part of our continuing mission to provide you with exceptional heart care, we have created designated Provider Care Teams.  These Care Teams include your primary Cardiologist (physician) and Advanced Practice Providers (APPs -  Physician Assistants and Nurse Practitioners) who all work together to provide you with the care you need, when you need it.  We recommend signing up for the patient portal called "MyChart".  Sign up information is provided on this After Visit Summary.  MyChart is used to connect with patients for Virtual Visits (Telemedicine).  Patients are able to view lab/test results, encounter notes, upcoming appointments, etc.  Non-urgent messages can be sent to your provider as well.   To learn more about what you can do with MyChart, go to [email or web address].    Your next appointment:   1 year(s)  The format for your next appointment:   In Person  Provider:   Sinclair Grooms, MD     Other Instructions     Signed, Sinclair Grooms, MD  01/29/2022 4:45 PM    [place]

## 2022-01-29 ENCOUNTER — Encounter: Payer: Self-pay | Admitting: Interventional Cardiology

## 2022-01-29 ENCOUNTER — Other Ambulatory Visit: Payer: Self-pay

## 2022-01-29 ENCOUNTER — Ambulatory Visit (INDEPENDENT_AMBULATORY_CARE_PROVIDER_SITE_OTHER): Payer: Medicare Other | Admitting: Interventional Cardiology

## 2022-01-29 VITALS — BP 102/62 | HR 67 | Ht 65.0 in | Wt 170.0 lb

## 2022-01-29 DIAGNOSIS — E782 Mixed hyperlipidemia: Secondary | ICD-10-CM

## 2022-01-29 DIAGNOSIS — I2541 Coronary artery aneurysm: Secondary | ICD-10-CM | POA: Diagnosis not present

## 2022-01-29 DIAGNOSIS — Z720 Tobacco use: Secondary | ICD-10-CM | POA: Diagnosis not present

## 2022-01-29 DIAGNOSIS — I2510 Atherosclerotic heart disease of native coronary artery without angina pectoris: Secondary | ICD-10-CM | POA: Diagnosis not present

## 2022-01-29 DIAGNOSIS — I471 Supraventricular tachycardia, unspecified: Secondary | ICD-10-CM

## 2022-01-29 DIAGNOSIS — I10 Essential (primary) hypertension: Secondary | ICD-10-CM

## 2022-01-29 NOTE — Patient Instructions (Signed)

## 2022-02-03 ENCOUNTER — Ambulatory Visit
Admission: RE | Admit: 2022-02-03 | Discharge: 2022-02-03 | Disposition: A | Discharge status: Home / Self Care | Payer: Medicare Other | Source: Ambulatory Visit | Attending: Geriatric Medicine | Admitting: Geriatric Medicine

## 2022-02-03 DIAGNOSIS — Z1231 Encounter for screening mammogram for malignant neoplasm of breast: Secondary | ICD-10-CM

## 2022-02-05 ENCOUNTER — Other Ambulatory Visit: Payer: Self-pay | Admitting: Geriatric Medicine

## 2022-02-05 DIAGNOSIS — R928 Other abnormal and inconclusive findings on diagnostic imaging of breast: Secondary | ICD-10-CM

## 2022-02-05 DIAGNOSIS — R222 Localized swelling, mass and lump, trunk: Secondary | ICD-10-CM | POA: Diagnosis not present

## 2022-02-05 DIAGNOSIS — E042 Nontoxic multinodular goiter: Secondary | ICD-10-CM | POA: Diagnosis not present

## 2022-02-15 DIAGNOSIS — E7800 Pure hypercholesterolemia, unspecified: Secondary | ICD-10-CM | POA: Diagnosis not present

## 2022-02-15 DIAGNOSIS — I10 Essential (primary) hypertension: Secondary | ICD-10-CM | POA: Diagnosis not present

## 2022-02-18 ENCOUNTER — Ambulatory Visit (INDEPENDENT_AMBULATORY_CARE_PROVIDER_SITE_OTHER): Payer: Medicare Other | Admitting: Podiatry

## 2022-02-18 ENCOUNTER — Encounter: Payer: Self-pay | Admitting: Podiatry

## 2022-02-18 ENCOUNTER — Other Ambulatory Visit: Payer: Self-pay

## 2022-02-18 DIAGNOSIS — M7751 Other enthesopathy of right foot: Secondary | ICD-10-CM

## 2022-02-18 MED ORDER — TRIAMCINOLONE ACETONIDE 10 MG/ML IJ SUSP
10.0000 mg | Freq: Once | INTRAMUSCULAR | Status: AC
Start: 2022-02-18 — End: 2022-02-18
  Administered 2022-02-18: 10 mg

## 2022-02-19 NOTE — Progress Notes (Signed)
Subjective:  ? ?Patient ID: Stephanie Newton, female   DOB: 81 y.o.   MRN: [id number]  ? ?HPI ?Patient presents stating that she only had a couple months relief with the injection we did last time and she knows that she needs surgery on her joint but she is scared because her knee replacements did not do well ? ? ?ROS ? ? ?   ?Objective:  ?Physical Exam  ?Neurovascular status intact with significant inflammation of the first MPJ right with reduced range of motion crepitus of the joint and pain with palpation  ? ?   ?Assessment:  ?Inflammatory capsulitis with hallux limitus rigidus deformity first MPJ right foot ? ?   ?Plan:  ?H&P reviewed condition and at this point did sterile prep and injected the first MPJ 3 mg Kenalog 5 mg Xylocaine and at great length discussed with her the hallux limitus deformity and long-term that surgical intervention is her best option.  She will consider this and at 1 point in future I do believe this will be best for her ?   ? ? ?

## 2022-02-25 ENCOUNTER — Ambulatory Visit
Admission: RE | Admit: 2022-02-25 | Discharge: 2022-02-25 | Disposition: A | Discharge status: Home / Self Care | Payer: Medicare Other | Source: Ambulatory Visit | Attending: Geriatric Medicine | Admitting: Geriatric Medicine

## 2022-02-25 DIAGNOSIS — R922 Inconclusive mammogram: Secondary | ICD-10-CM | POA: Diagnosis not present

## 2022-02-25 DIAGNOSIS — R928 Other abnormal and inconclusive findings on diagnostic imaging of breast: Secondary | ICD-10-CM

## 2022-06-09 DIAGNOSIS — R079 Chest pain, unspecified: Secondary | ICD-10-CM | POA: Diagnosis not present

## 2022-06-09 DIAGNOSIS — R42 Dizziness and giddiness: Secondary | ICD-10-CM | POA: Diagnosis not present

## 2022-06-09 DIAGNOSIS — R001 Bradycardia, unspecified: Secondary | ICD-10-CM | POA: Diagnosis not present

## 2022-06-10 DIAGNOSIS — R001 Bradycardia, unspecified: Secondary | ICD-10-CM | POA: Diagnosis not present

## 2022-06-10 DIAGNOSIS — R5383 Other fatigue: Secondary | ICD-10-CM | POA: Diagnosis not present

## 2022-06-10 DIAGNOSIS — I10 Essential (primary) hypertension: Secondary | ICD-10-CM | POA: Diagnosis not present

## 2022-06-12 ENCOUNTER — Emergency Department (HOSPITAL_COMMUNITY): Payer: Medicare Other

## 2022-06-12 ENCOUNTER — Emergency Department (HOSPITAL_COMMUNITY)
Admission: EM | Admit: 2022-06-12 | Discharge: 2022-06-13 | Disposition: A | Discharge status: Home / Self Care | Payer: Medicare Other | Attending: Emergency Medicine | Admitting: Emergency Medicine

## 2022-06-12 ENCOUNTER — Other Ambulatory Visit: Payer: Self-pay

## 2022-06-12 DIAGNOSIS — Z79899 Other long term (current) drug therapy: Secondary | ICD-10-CM | POA: Insufficient documentation

## 2022-06-12 DIAGNOSIS — I10 Essential (primary) hypertension: Secondary | ICD-10-CM | POA: Diagnosis not present

## 2022-06-12 DIAGNOSIS — R072 Precordial pain: Secondary | ICD-10-CM | POA: Insufficient documentation

## 2022-06-12 DIAGNOSIS — R0602 Shortness of breath: Secondary | ICD-10-CM | POA: Diagnosis not present

## 2022-06-12 DIAGNOSIS — R079 Chest pain, unspecified: Secondary | ICD-10-CM | POA: Diagnosis not present

## 2022-06-12 LAB — CBC WITH DIFFERENTIAL/PLATELET
Abs Immature Granulocytes: 0.01 10*3/uL (ref 0.00–0.07)
Basophils Absolute: 0 10*3/uL (ref 0.0–0.1)
Basophils Relative: 1 %
Eosinophils Absolute: 0.1 10*3/uL (ref 0.0–0.5)
Eosinophils Relative: 3 %
HCT: 43.5 % (ref 36.0–46.0)
Hemoglobin: 14.1 g/dL (ref 12.0–15.0)
Immature Granulocytes: 0 %
Lymphocytes Relative: 35 %
Lymphs Abs: 1.8 10*3/uL (ref 0.7–4.0)
MCH: 29.4 pg (ref 26.0–34.0)
MCHC: 32.4 g/dL (ref 30.0–36.0)
MCV: 90.8 fL (ref 80.0–100.0)
Monocytes Absolute: 0.5 10*3/uL (ref 0.1–1.0)
Monocytes Relative: 10 %
Neutro Abs: 2.7 10*3/uL (ref 1.7–7.7)
Neutrophils Relative %: 51 %
Platelets: 268 10*3/uL (ref 150–400)
RBC: 4.79 MIL/uL (ref 3.87–5.11)
RDW: 13.8 % (ref 11.5–15.5)
WBC: 5.2 10*3/uL (ref 4.0–10.5)
nRBC: 0 % (ref 0.0–0.2)

## 2022-06-12 LAB — BASIC METABOLIC PANEL
Anion gap: 11 (ref 5–15)
BUN: 14 mg/dL (ref 8–23)
CO2: 26 mmol/L (ref 22–32)
Calcium: 10 mg/dL (ref 8.9–10.3)
Chloride: 103 mmol/L (ref 98–111)
Creatinine, Ser: 0.91 mg/dL (ref 0.44–1.00)
GFR, Estimated: >60 mL/min (ref >60)
Glucose, Bld: 90 mg/dL (ref 70–99)
Potassium: 3.7 mmol/L (ref 3.5–5.1)
Sodium: 140 mmol/L (ref 135–145)

## 2022-06-12 LAB — TROPONIN I (HIGH SENSITIVITY)
Troponin I (High Sensitivity): 4 ng/L (ref <18)
Troponin I (High Sensitivity): 4 ng/L (ref <18)

## 2022-06-12 LAB — BRAIN NATRIURETIC PEPTIDE: B Natriuretic Peptide: 55 pg/mL (ref 0.0–100.0)

## 2022-06-12 NOTE — ED Triage Notes (Signed)
Pt with intermittent sob and central chest pain since Wednesday. Also concerned that her heart rate has been down to 58 today. Takes metoprolol with hx of SVT.

## 2022-06-12 NOTE — ED Provider Triage Note (Signed)
Emergency Medicine Provider Triage Evaluation Note  Stephanie Newton , a 81 y.o. female  was evaluated in triage.  Pt complains of intermittent shortness of breath, chest discomfort, exertional dyspnea, and labile pressures.. hr went to 58- concerned for bradycardia and she is on metoprolol for svt  Review of Systems  Positive: cp Negative: fever  Physical Exam  BP (!) 181/89 (BP Location: Right Arm)   Pulse 76   Temp 98.3 F (36.8 C) (Oral)   Resp (!) 24   SpO2 98%  Gen:   Awake, no distress   Resp:  Normal effort  MSK:   Moves extremities without difficulty  Other:  No peripheral edema  Medical Decision Making  Medically screening exam initiated at 4:59 PM.  Appropriate orders placed.  [name] was informed that the remainder of the evaluation will be completed by another provider, this initial triage assessment does not replace that evaluation, and the importance of remaining in the ED until their evaluation is complete.  Work up [employer], [place], PA-C 06/12/22 1700

## 2022-06-13 ENCOUNTER — Encounter (HOSPITAL_COMMUNITY): Payer: Self-pay | Admitting: Emergency Medicine

## 2022-06-13 DIAGNOSIS — R072 Precordial pain: Secondary | ICD-10-CM | POA: Diagnosis not present

## 2022-06-13 MED ORDER — LOSARTAN POTASSIUM 50 MG PO TABS
25.0000 mg | ORAL_TABLET | ORAL | Status: AC
  Administered 2022-06-13: 25 mg via ORAL
  Filled 2022-06-13: qty 1

## 2022-06-13 MED ORDER — ALUM & MAG HYDROXIDE-SIMETH 200-200-20 MG/5ML PO SUSP
30.0000 mL | Freq: Once | ORAL | Status: AC
  Administered 2022-06-13: 30 mL via ORAL
  Filled 2022-06-13: qty 30

## 2022-06-13 MED ORDER — LOSARTAN POTASSIUM 25 MG PO TABS: 25.0000 mg | ORAL_TABLET | Freq: Every day | ORAL | 0 refills | Status: DC

## 2022-06-13 NOTE — ED Provider Notes (Signed)
[facility] EMERGENCY DEPARTMENT Provider Note   CSN: [id number] Arrival date & time: 06/12/22  1649     History  Chief Complaint  Patient presents with   Chest Pain   Shortness of Breath    Stephanie Newton is a 81 y.o. female.  The history is provided by the patient.  Chest Pain Pain location:  Substernal area Pain quality: dull   Pain radiates to:  Neck Pain severity:  Moderate Onset quality:  Gradual Duration:  4 days Timing:  Constant Progression:  Unchanged Chronicity:  New Relieved by:  Nothing Worsened by:  Nothing Associated symptoms: no fever   Risk factors: not female and no surgery   Patient with HTN presents with SSCP and intermittent SOB and low pulse rate.  She states she was told to be seen by her family doctor for these symptoms.       Home Medications Prior to Admission medications   Medication Sig Start Date End Date Taking? Authorizing Provider  amLODipine (NORVASC) 2.5 MG tablet Take 2.5 mg by mouth daily. 09/03/20   [provider]  estradiol (ESTRACE) 1 MG tablet Take 1 mg by mouth daily.    [provider]  famotidine (PEPCID) 20 MG tablet Take 20 mg by mouth as needed. 01/26/22   [provider]  hydrochlorothiazide (HYDRODIURIL) 12.5 MG tablet Take 12.5 mg by mouth daily.     [provider]  HYDROcodone-acetaminophen (NORCO/VICODIN) 5-325 MG tablet Take 1 tablet by mouth daily as needed for moderate pain. 05/15/21   [provider]  losartan (COZAAR) 50 MG tablet Take 50 mg by mouth every morning. 07/28/20   [provider]  methocarbamol (ROBAXIN) 500 MG tablet Take 1 tablet (500 mg total) by mouth every 6 (six) hours as needed for muscle spasms. 05/01/20   Edmisten, Kristie L, PA  metoprolol succinate (TOPROL XL) 50 MG 24 hr tablet Take 3 tablets (150 mg total) by mouth daily. 09/08/21 10/08/21  Eustaquio Maize, PA-C  oxyCODONE (OXY IR/ROXICODONE) 5 MG immediate release tablet  Take 1-2 tablets (5-10 mg total) by mouth every 6 (six) hours as needed for severe pain. 05/01/20   Edmisten, Kristie L, PA  predniSONE (STERAPRED UNI-PAK 21 TAB) 10 MG (21) TBPK tablet Take by mouth daily. Take 6 tabs by mouth daily  for 2 days, then 5 tabs for 2 days, then 4 tabs for 2 days, then 3 tabs for 2 days, 2 tabs for 2 days, then 1 tab by mouth daily for 2 days 07/03/21   Arnaldo Natal, MD  simvastatin (ZOCOR) 20 MG tablet Take 20 mg by mouth at bedtime.    [provider]      Allergies    Shellfish allergy    Review of Systems   Review of Systems  Constitutional:  Negative for fever.  HENT:  Negative for facial swelling.   Cardiovascular:  Positive for chest pain.  All other systems reviewed and are negative.   Physical Exam Updated Vital Signs BP (!) 183/82   Pulse (!) 57   Temp 98.3 F (36.8 C) (Oral)   Resp (!) 23   SpO2 100%  Physical Exam Vitals and nursing note reviewed.  Constitutional:      General: She is not in acute distress.    Appearance: Normal appearance. She is well-developed.  HENT:     Head: Normocephalic and atraumatic.     Nose: Nose normal.  Eyes:     Pupils: Pupils  are equal, round, and reactive to light.  Cardiovascular:     Rate and Rhythm: Normal rate and regular rhythm.     Pulses: Normal pulses.     Heart sounds: Normal heart sounds.  Pulmonary:     Effort: Pulmonary effort is normal. No respiratory distress.     Breath sounds: Normal breath sounds.  Abdominal:     General: Abdomen is flat. Bowel sounds are normal. There is no distension.     Palpations: Abdomen is soft.     Tenderness: There is no abdominal tenderness. There is no guarding or rebound.  Genitourinary:    Vagina: No vaginal discharge.  Musculoskeletal:        General: Normal range of motion.     Cervical back: Neck supple.     Right lower leg: No edema.     Left lower leg: No edema.  Skin:    General: Skin is warm and dry.     Capillary Refill:  Capillary refill takes less than 2 seconds.     Findings: No erythema or rash.  Neurological:     General: No focal deficit present.     Mental Status: She is alert and oriented to person, place, and time.     Deep Tendon Reflexes: Reflexes normal.  Psychiatric:        Mood and Affect: Mood normal.        Behavior: Behavior normal.     ED Results / Procedures / Treatments   Labs (all labs ordered are listed, but only abnormal results are displayed) Results for orders placed or performed during the hospital encounter of 06/12/22  CBC with Differential  Result Value Ref Range   WBC 5.2 4.0 - 10.5 K/uL   RBC 4.79 3.87 - 5.11 MIL/uL   Hemoglobin 14.1 12.0 - 15.0 g/dL   HCT 43.5 36.0 - 46.0 %   MCV 90.8 80.0 - 100.0 fL   MCH 29.4 26.0 - 34.0 pg   MCHC 32.4 30.0 - 36.0 g/dL   RDW 13.8 11.5 - 15.5 %   Platelets 268 150 - 400 K/uL   nRBC 0.0 0.0 - 0.2 %   Neutrophils Relative % 51 %   Neutro Abs 2.7 1.7 - 7.7 K/uL   Lymphocytes Relative 35 %   Lymphs Abs 1.8 0.7 - 4.0 K/uL   Monocytes Relative 10 %   Monocytes Absolute 0.5 0.1 - 1.0 K/uL   Eosinophils Relative 3 %   Eosinophils Absolute 0.1 0.0 - 0.5 K/uL   Basophils Relative 1 %   Basophils Absolute 0.0 0.0 - 0.1 K/uL   Immature Granulocytes 0 %   Abs Immature Granulocytes 0.01 0.00 - 0.07 K/uL  Brain natriuretic peptide  Result Value Ref Range   B Natriuretic Peptide 55.0 0.0 - 100.0 pg/mL  Basic metabolic panel  Result Value Ref Range   Sodium 140 135 - 145 mmol/L   Potassium 3.7 3.5 - 5.1 mmol/L   Chloride 103 98 - 111 mmol/L   CO2 26 22 - 32 mmol/L   Glucose, Bld 90 70 - 99 mg/dL   BUN 14 8 - 23 mg/dL   Creatinine, Ser 0.91 0.44 - 1.00 mg/dL   Calcium 10.0 8.9 - 10.3 mg/dL   GFR, Estimated >60 >60 mL/min   Anion gap 11 5 - 15  Troponin I (High Sensitivity)  Result Value Ref Range   Troponin I (High Sensitivity) 4 <18 ng/L  Troponin I (High Sensitivity)  Result Value Ref Range  Troponin I (High Sensitivity) 4  <18 ng/L   DG Chest 2 View  Result Date: 06/12/2022 CLINICAL DATA:  Chest pain, shortness of breath EXAM: CHEST - 2 VIEW COMPARISON:  09/08/2021 FINDINGS: The heart size and mediastinal contours are within normal limits. Both lungs are clear. The visualized skeletal structures are unremarkable. IMPRESSION: No acute abnormality of the lungs. Electronically Signed   By: Delanna Ahmadi M.D.   On: 06/12/2022 18:00      EKG EKG Interpretation  Date/Time:  Saturday June 12 2022 16:55:58 EDT Ventricular Rate:  71 PR Interval:  178 QRS Duration: 72 QT Interval:  382 QTC Calculation: 415 R Axis:   56 Text Interpretation: Normal sinus rhythm Low voltage QRS Confirmed by Randal Buba, Adrienne Trombetta ([id number]) on 06/13/2022 12:45:15 AM  Radiology DG Chest 2 View  Result Date: 06/12/2022 CLINICAL DATA:  Chest pain, shortness of breath EXAM: CHEST - 2 VIEW COMPARISON:  09/08/2021 FINDINGS: The heart size and mediastinal contours are within normal limits. Both lungs are clear. The visualized skeletal structures are unremarkable. IMPRESSION: No acute abnormality of the lungs. Electronically Signed   By: Delanna Ahmadi M.D.   On: 06/12/2022 18:00    Procedures Procedures    Medications Ordered in ED Medications  alum & mag hydroxide-simeth (MAALOX/MYLANTA) 200-200-20 MG/5ML suspension 30 mL (has no administration in time range)    ED Course/ Medical Decision Making/ A&P                           Medical Decision Making Patient with HTN on Toprol XL 100 mg and PRN HCTZ who presents with CP and elevated BP with low heart rate.    Amount and/or Complexity of Data Reviewed External Data Reviewed: notes.    Details: previous notes reviewed Labs: ordered.    Details: all labs reviewed: 2 negative troponins.  Normal BNP and normal CBC.  chemistry pain is also normal with normal creatinine Radiology: ordered.    Details: normal XR by me ECG/medicine tests: ordered and independent interpretation performed.  Decision-making details documented in ED Course. Discussion of management or test interpretation with external provider(s): Case d/w Dr. Renella Cunas of cardiology who has seen the patient.  Start Losartan 25 mg daily.  First dose in ED  Risk OTC drugs. Prescription drug management. Risk Details: Ruled out for MI in the ED with negative EKG and 2 negative troponins.  Patient given losartan 25 mg in ED and will follow up with PMD and cardiology as an outpatient.  Stable for discharge with close follow up.     Final Clinical Impression(s) / ED Diagnoses Final diagnoses:  None  Return for intractable cough, coughing up blood, fevers > 100.4 unrelieved by medication, shortness of breath, intractable vomiting, chest pain, shortness of breath, weakness, numbness, changes in speech, facial asymmetry, abdominal pain, passing out, Inability to tolerate liquids or food, cough, altered mental status or any concerns. No signs of systemic illness or infection. The patient is nontoxic-appearing on exam and vital signs are within normal limits.  I have reviewed the triage vital signs and the nursing notes. Pertinent labs & imaging results that were available during my care of the patient were reviewed by me and considered in my medical decision making (see chart for details). After history, exam, and medical workup I feel the patient has been appropriately medically screened and is safe for discharge home. Pertinent diagnoses were discussed with the patient. Patient was given return precautions.  Rx / DC Orders ED Discharge Orders     None         Chantalle Defilippo, MD 06/13/22 1423

## 2022-06-13 NOTE — Consult Note (Signed)
Cardiology Consultation:   Patient ID: Stephanie Newton MRN: [id number]; DOB: 1941/06/06  Admit date: 06/12/2022 Date of Consult: 06/13/2022  Primary Care Provider: Lajean Manes, MD [facility] Cardiologist: Sinclair Grooms, MD  [place] Electrophysiologist:  None   Patient Profile:   Stephanie Newton is a 81 y.o. female with HLD, HTN, prior tobacco use, NSTEMI, pSVT and LM saccular aneurysm who is being seen today for the evaluation of SOB and CP at the request of Dr. Randal Buba.   History of Present Illness:   [name] reported intermittent shortness of breath, chest discomfort, exertional dyspnea, and labile blood pressures.  She is concerned about bradycardia (HR 58) on metoprolol for SVT.  VS on arrival: P 76, BP 181/89, RR 24, O2 98%/RA, T 98.50F.   Labs reviewed: hsT (4-4), mild hypoK (3.8), BNP (55).   She was last seen by Dr. Tamala Julian on 01/29/22.  At that time she had not had any recurrent recent episodes of paroxysmal SVT.  She denied any angina or recurrent chest pain.  She denied any shortness of breath.   During she had persistent hypertension but was otherwise with normal vital signs and normal sinus rhythm in the 70s.  She had no symptoms and had described an odd sensation in her neck but no focal chest pain or pressure.  She then noticed that her blood pressure was somewhat low along with her heart rate for which her medications have been held.  She then resume several of her medications today.  We reviewed the medication list with her son at home.  She was on losartan 50 mg daily however this was discontinued in October when she was evaluated for recurrent SVT and her Toprol-XL was increased from 100 mg to 150 mg daily.  She was seen in follow-up in February with Dr. Tamala Julian and had well-controlled blood pressure although it was documented that she is still on losartan and had to discontinued in October per chart review.   Past Medical History:  Diagnosis Date    Hypercholesteremia    Hypertension    Past Surgical History:  Procedure Laterality Date   BREAST BIOPSY     bilateral    EYE SURGERY     cataract bilateral    HERNIA REPAIR  06/2000   INCISIONAL VENTRAL ; DR Nedra Hai    KNEE ARTHROSCOPY Left 08/23/2017   dr Theda Sers    LAPAROSCOPIC CHOLECYSTECTOMY  06/13/2000   DR DOUG [facility]    LEFT HEART CATH AND CORONARY ANGIOGRAPHY N/A 12/04/2018   Procedure: LEFT HEART CATH AND CORONARY ANGIOGRAPHY;  Surgeon: Belva Crome, MD;  Location: [facility];  Service: Cardiovascular;  Laterality: N/A;   TOTAL KNEE ARTHROPLASTY Left 03/31/2018   Procedure: LEFT TOTAL KNEE ARTHROPLASTY;  Surgeon: Sydnee Cabal, MD;  Location: [facility];  Service: Orthopedics;  Laterality: Left;  with block   TOTAL KNEE ARTHROPLASTY Right 04/30/2020   Procedure: RIGHT TOTAL KNEE ARTHROPLASTY;  Surgeon: Gaynelle Arabian, MD;  Location: [facility];  Service: Orthopedics;  Laterality: Right;  26mn   VAGINAL HYSTERECTOMY  age [age]s   Home Medications:  Prior to Admission medications   Medication Sig Start Date End Date Taking? Authorizing Provider  amLODipine (NORVASC) 2.5 MG tablet Take 2.5 mg by mouth daily. 09/03/20   [provider]  estradiol (ESTRACE) 1 MG tablet Take 1 mg by mouth daily.    [provider]  famotidine (PEPCID) 20 MG tablet Take 20 mg by  mouth as needed. 01/26/22   [provider]  hydrochlorothiazide (HYDRODIURIL) 12.5 MG tablet Take 12.5 mg by mouth daily.     [provider]  HYDROcodone-acetaminophen (NORCO/VICODIN) 5-325 MG tablet Take 1 tablet by mouth daily as needed for moderate pain. 05/15/21   [provider]  losartan (COZAAR) 50 MG tablet Take 50 mg by mouth every morning. 07/28/20   [provider]  methocarbamol (ROBAXIN) 500 MG tablet Take 1 tablet (500 mg total) by mouth every 6 (six) hours as needed for muscle spasms. 05/01/20   Edmisten, Kristie L, PA  metoprolol succinate  (TOPROL XL) 50 MG 24 hr tablet Take 3 tablets (150 mg total) by mouth daily. 09/08/21 10/08/21  Eustaquio Maize, PA-C  oxyCODONE (OXY IR/ROXICODONE) 5 MG immediate release tablet Take 1-2 tablets (5-10 mg total) by mouth every 6 (six) hours as needed for severe pain. 05/01/20   Edmisten, Kristie L, PA  predniSONE (STERAPRED UNI-PAK 21 TAB) 10 MG (21) TBPK tablet Take by mouth daily. Take 6 tabs by mouth daily  for 2 days, then 5 tabs for 2 days, then 4 tabs for 2 days, then 3 tabs for 2 days, 2 tabs for 2 days, then 1 tab by mouth daily for 2 days 07/03/21   Arnaldo Natal, MD  simvastatin (ZOCOR) 20 MG tablet Take 20 mg by mouth at bedtime.    [provider]    Inpatient Medications: Scheduled Meds:  alum & mag hydroxide-simeth  30 mL Oral Once   Continuous Infusions:  PRN Meds:   Allergies:    Allergies  Allergen Reactions   Shellfish Allergy Nausea Only    Social History:   Social History   Socioeconomic History   Marital status: Married    Spouse name: Not on file   Number of children: Not on file   Years of education: Not on file   Highest education level: Not on file  Occupational History   Not on file  Tobacco Use   Smoking status: Former    Years: 30.00    Types: Cigarettes   Smokeless tobacco: Never   Tobacco comments:    6 cigarettes a day  Vaping Use   Vaping Use: Never used  Substance and Sexual Activity   Alcohol use: Yes    Comment: occasionally    Drug use: No   Sexual activity: Yes  Other Topics Concern   Not on file  Social History Narrative   Not on file   Social Determinants of Health   Financial Resource Strain: Unknown (12/04/2018)   Overall Financial Resource Strain (CARDIA)    Difficulty of Paying Living Expenses: Patient refused  Food Insecurity: Unknown (12/04/2018)   Hunger Vital Sign    Worried About Running Out of Food in the Last Year: Patient refused    [place] in the Last Year: Patient refused  Transportation  Needs: Unknown (12/04/2018)   [place] - Transportation    Lack of Transportation (Medical): Patient refused    Lack of Transportation (Non-Medical): Patient refused  Physical Activity: Unknown (12/04/2018)   Exercise Vital Sign    Days of Exercise per Week: Patient refused    Minutes of Exercise per Session: Patient refused  Stress: Unknown (12/04/2018)   [employer] [place]    Feeling of Stress : Patient refused  Social Connections: Unknown (12/04/2018)   Social Connection and Isolation Panel [NHANES]    Frequency of Communication with Friends and  Family: Patient refused    Frequency of Social Gatherings with Friends and Family: Patient refused    Attends Religious Services: Patient refused    Active Member of Clubs or Organizations: Patient refused    Attends Archivist Meetings: Patient refused    Marital Status: Patient refused  Intimate Partner Violence: Unknown (12/04/2018)   Humiliation, Afraid, Rape, and Kick questionnaire    Fear of Current or Ex-Partner: Patient refused    Emotionally Abused: Patient refused    Physically Abused: Patient refused    Sexually Abused: Patient refused    Family History:   History reviewed. No pertinent family history.   ROS:  Review of Systems: [y] = yes, '[ ]'$  = no      General: Weight gain '[ ]'$ ; Weight loss '[ ]'$ ; Anorexia '[ ]'$ ; Fatigue '[ ]'$ ; Fever '[ ]'$ ; Chills '[ ]'$ ; Weakness '[ ]'$    Cardiac: Chest pain/pressure '[ ]'$ ; Resting SOB '[ ]'$ ; Exertional SOB '[ ]'$ ; Orthopnea '[ ]'$ ; Pedal Edema '[ ]'$ ; Palpitations '[ ]'$ ; Syncope '[ ]'$ ; Presyncope '[ ]'$ ; Paroxysmal nocturnal dyspnea '[ ]'$    Pulmonary: Cough '[ ]'$ ; Wheezing '[ ]'$ ; Hemoptysis '[ ]'$ ; Sputum '[ ]'$ ; Snoring '[ ]'$    GI: Vomiting '[ ]'$ ; Dysphagia '[ ]'$ ; Melena '[ ]'$ ; Hematochezia '[ ]'$ ; Heartburn '[ ]'$ ; Abdominal pain '[ ]'$ ; Constipation '[ ]'$ ; Diarrhea '[ ]'$ ; BRBPR '[ ]'$    GU: Hematuria '[ ]'$ ; Dysuria '[ ]'$ ; Nocturia '[ ]'$  Vascular: Pain in legs with walking '[ ]'$ ; Pain in feet with  lying flat '[ ]'$ ; Non-healing sores '[ ]'$ ; Stroke '[ ]'$ ; TIA '[ ]'$ ; Slurred speech '[ ]'$ ;   Neuro: Headaches '[ ]'$ ; Vertigo '[ ]'$ ; Seizures '[ ]'$ ; Paresthesias '[ ]'$ ;Blurred vision '[ ]'$ ; Diplopia '[ ]'$ ; Vision changes '[ ]'$    Ortho/Skin: Arthritis '[ ]'$ ; Joint pain '[ ]'$ ; Muscle pain '[ ]'$ ; Joint swelling '[ ]'$ ; Back Pain '[ ]'$ ; Rash '[ ]'$    Psych: Depression '[ ]'$ ; Anxiety '[ ]'$    Heme: Bleeding problems '[ ]'$ ; Clotting disorders '[ ]'$ ; Anemia '[ ]'$    Endocrine: Diabetes '[ ]'$ ; Thyroid dysfunction '[ ]'$    Physical Exam/Data:   Vitals:   06/12/22 2031 06/12/22 2134 06/13/22 0040 06/13/22 0130  BP: (!) 159/73 (!) 163/80 (!) 157/79 (!) 183/82  Pulse: 62 60 (!) 56 (!) 57  Resp: '16 16 16 '$ (!) 23  Temp:      TempSrc:      SpO2: 99% 96% 97% 100%   No intake or output data in the 24 hours ending 06/13/22 0142    01/29/2022    4:22 PM 09/08/2021    4:07 PM 09/06/2021    1:12 AM  Last 3 Weights  Weight (lbs) 170 lb 170 lb 160 lb  Weight (kg) 77.111 kg 77.111 kg 72.576 kg     There is no height or weight on file to calculate BMI.  General:  Well nourished, well developed, in no acute distress HEENT: normal Lymph: no adenopathy Neck: no JVD Endocrine:  No thryomegaly Vascular: No carotid bruits; FA pulses 2+ bilaterally without bruits  Cardiac:  normal S1, S2; RRR; no murmur  Lungs:  clear to auscultation bilaterally, no wheezing, rhonchi or rales  Abd: soft, nontender, no hepatomegaly  Ext: no edema Musculoskeletal:  No deformities, BUE and BLE strength normal and equal Skin: warm and dry  Neuro:  CNs 2-12 intact, no focal abnormalities noted Psych:  Normal affect   EKG:  The EKG was personally reviewed and demonstrates: (  06/12/22, 16:55:58), NSR 71 bpm, PR 178 ms, QRS 92 ms, Qtc 415 ms Telemetry:  Telemetry was personally reviewed and demonstrates: NSR, HR 70s  Relevant CV Studies:  CORONARY CT with FFR  Result date: 11/24/21 1. Coronary calcium score of 1. This was 30th percentile for age, sex, and race matched control 2.  Normal coronary origin with right dominance. 3. Distal left main saccular aneurysm again noted, dimensions slightly larger than on the prior study. No thrombus in the aneurysm. 4. 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  Laboratory Data:  High Sensitivity Troponin:   Recent Labs  Lab 06/12/22 2023 06/12/22 2117  TROPONINIHS 4 4     Chemistry Recent Labs  Lab 06/12/22 2023  NA 140  K 3.7  CL 103  CO2 26  GLUCOSE 90  BUN 14  CREATININE 0.91  CALCIUM 10.0  GFRNONAA >60  ANIONGAP 11    No results for input(s): "PROT", "ALBUMIN", "AST", "ALT", "ALKPHOS", "BILITOT" in the last 168 hours. Hematology Recent Labs  Lab 06/12/22 2117  WBC 5.2  RBC 4.79  HGB 14.1  HCT 43.5  MCV 90.8  MCH 29.4  MCHC 32.4  RDW 13.8  PLT 268   BNP Recent Labs  Lab 06/12/22 2117  BNP 55.0    DDimer No results for input(s): "DDIMER" in the last 168 hours.  Radiology/Studies:  DG Chest 2 View  Result Date: 06/12/2022 CLINICAL DATA:  Chest pain, shortness of breath EXAM: CHEST - 2 VIEW COMPARISON:  09/08/2021 FINDINGS: The heart size and mediastinal contours are within normal limits. Both lungs are clear. The visualized skeletal structures are unremarkable. IMPRESSION: No acute abnormality of the lungs. Electronically Signed   By: Delanna Ahmadi M.D.   On: 06/12/2022 18:00    Assessment and Plan:   HTN Ms. Vitiello was evaluated earlier this week for an odd sensation in her chest/throat along with generalized malaise.  Several of her blood pressure medications were held and she was having a few home readings with lower than normal BP.  Her blood pressure has been persistently elevated since coming to the emergency department with systolics ranging 229-798 fairly consistently.  I do not have a clear reason why she is having labile blood pressures this week however she has no symptoms and her lab work was all reassuring.  I think we should restart  low-dose losartan and uptitrate if needed.  We will give her losartan 25 mg daily now.  This is also been sent into her pharmacy.  She will recheck blood pressure in the morning.  She continues to have short bursts of SVT that only last a few seconds while on Toprol-XL 150 mg daily.  I reviewed her medication list with her son at home and she should continue with all of her other medications.  If she tolerates losartan she will need a BMP in 1 week with her primary care physician.  For questions or updates, please contact [place] Please consult [email or web address] for contact info under   Signed, Dion Body, MD  06/13/2022 1:42 AM

## 2022-06-14 DIAGNOSIS — I10 Essential (primary) hypertension: Secondary | ICD-10-CM | POA: Diagnosis not present

## 2022-06-14 DIAGNOSIS — E7800 Pure hypercholesterolemia, unspecified: Secondary | ICD-10-CM | POA: Diagnosis not present

## 2022-06-16 DIAGNOSIS — I10 Essential (primary) hypertension: Secondary | ICD-10-CM | POA: Diagnosis not present

## 2022-06-16 DIAGNOSIS — I700 Atherosclerosis of aorta: Secondary | ICD-10-CM | POA: Diagnosis not present

## 2022-06-27 NOTE — Progress Notes (Addendum)
Office Visit    Patient Name: Stephanie Newton Date of Encounter: 06/28/2022  PCP:  Lajean Manes, [place]  Cardiologist:  Sinclair Grooms, MD  Advanced Practice Provider:  No care team member to display Electrophysiologist:  None   Chief Complaint    Stephanie Newton is a 81 y.o. female with a hx of hypertension, hyperlipidemia, tobacco use, NSTEMI, PSVT, and LM saccular aneurysm presents today for post ER visit.  She was seen by Dr. Tamala Julian February 2023.  She had a difficult time with physical activity due to bilateral knee discomfort and varying degrees of immobility after 2 knee operations without optimal results.  She had rare episodes of very brief PSVT.  She denied any chest pain.  She had gained weight during the pandemic.  She presented to the ED 06/12/2022 with chest pain and shortness of breath.  She stated that she was having some dull substernal chest pain that radiates to the neck.  It was moderate in consistency and came on gradually.  It occurred for the last 4 days and was constant.  Labs were drawn and troponin was negative.  EKG did not show any acute ischemic changes.  Today, she states that she never had chest pain but had a sensation in the center of her chest.  She states she had an erratic heartbeat that she felt into her throat.  She also has some shortness of breath.  It came on suddenly.  She states that her heartbeat has been slow not fast as of late.  It was usually in the 69s.  At this time she does get a little  lightheaded.  She had a long episode of SVT back in 2019 and wore a monitor at that time and underwent a full work-up.  She did not have any significant arrhythmia on her monitor that she wore and she had sporadic PVCs and PACs.  She states that she gets this feeling about twice a month but is usually very short-lived.  She feels that her episodes have been shorter since October 2022.  This was when her metoprolol was  increased in dosage.  Her blood pressure is well controlled today in the office but she states that it has gone up and down.  I suggested getting an appointment with our electrophysiologist to further work-up her bradycardia by wearing a more long-term monitor.  She did not want to pursue this at this moment.   No edema, orthopnea, PND.   Past Medical History    Past Medical History:  Diagnosis Date   Hypercholesteremia    Hypertension    Past Surgical History:  Procedure Laterality Date   BREAST BIOPSY     bilateral    EYE SURGERY     cataract bilateral    HERNIA REPAIR  06/2000   INCISIONAL VENTRAL ; DR Nedra Hai    KNEE ARTHROSCOPY Left 08/23/2017   dr Theda Sers    LAPAROSCOPIC CHOLECYSTECTOMY  06/13/2000   DR DOUG [facility]    LEFT HEART CATH AND CORONARY ANGIOGRAPHY N/A 12/04/2018   Procedure: LEFT HEART CATH AND CORONARY ANGIOGRAPHY;  Surgeon: Belva Crome, MD;  Location: [facility];  Service: Cardiovascular;  Laterality: N/A;   TOTAL KNEE ARTHROPLASTY Left 03/31/2018   Procedure: LEFT TOTAL KNEE ARTHROPLASTY;  Surgeon: Sydnee Cabal, MD;  Location: [facility];  Service: Orthopedics;  Laterality: Left;  with block   TOTAL KNEE ARTHROPLASTY Right 04/30/2020  Procedure: RIGHT TOTAL KNEE ARTHROPLASTY;  Surgeon: Gaynelle Arabian, MD;  Location: [facility];  Service: Orthopedics;  Laterality: Right;  21mn   VAGINAL HYSTERECTOMY  age 3050s   Allergies  Allergies  Allergen Reactions   Shellfish Allergy Nausea Only    EKGs/Labs/Other Studies Reviewed:   The following studies were reviewed today:  CORONARY CT with FFR 11/24/2021:  IMPRESSION: 1. Coronary calcium score of 1. This was 30th percentile for age, sex, and race matched control.   2. Normal coronary origin with right dominance.   3. Distal left main saccular aneurysm again noted, dimensions slightly larger than on the prior study. No thrombus in the   aneurysm.   4. Coronary-Cameral Fistulae into the  body of the left ventricle again demonstrated.  CORONARY CTA DECEMBER 2021 FINDINGS: Non-cardiac: [facility]   No LA appendage thrombus. Pulmonary veins drain normally to the left atrium.   Calcium Score: 0 Agatston units.   Coronary Arteries: Right dominant with no anomalies   LM: No plaque or stenosis. There is a 12 x 10 x 8 mm saccular aneurysm (January 2020 measurements: 8.5 x 8 x 10 mm saccular) that arises from the left main at the trifurcation of the LAD/LCx/ramus. No thrombus noted in the aneurysm.   LAD system: No plaque or stenosis.    Circumflex system: Moderate ramus is present. No plaque or stenosis in the LCx system.   RCA system: No plaque or stenosis.   IMPRESSION: 1. Coronary artery calcium score 0 Agatston units, suggesting low risk for future cardiac events.   2.  No significant coronary disease noted.   3. Distal left main saccular aneurysm again noted, dimensions slightly larger than on the prior study. No thrombus in the aneurysm.   EKG:  EKG normal sinus rhythm.  Normal PR interval.  Small inferior Q waves in 3 and aVF.    EKG:  EKG is not ordered today.    Recent Labs: 09/06/2021: Magnesium 1.8; TSH 3.346 06/12/2022: B Natriuretic Peptide 55.0; BUN 14; Creatinine, Ser 0.91; Hemoglobin 14.1; Platelets 268; Potassium 3.7; Sodium 140  Recent Lipid Panel    Component Value Date/Time   CHOL 193 12/03/2018 1447   TRIG 211 (H) 12/03/2018 1447   HDL 77 12/03/2018 1447   CHOLHDL 2.5 12/03/2018 1447   VLDL 42 (H) 12/03/2018 1447   LDLCALC 74 12/03/2018 1447    Home Medications   Current Meds  Medication Sig   amLODipine (NORVASC) 2.5 MG tablet Take 2.5 mg by mouth daily.   aspirin EC 81 MG tablet Take 1 tablet by mouth daily.   estradiol (ESTRACE) 1 MG tablet Take 1 mg by mouth daily.   famotidine (PEPCID) 20 MG tablet Take 20 mg by mouth as needed.   hydrochlorothiazide (HYDRODIURIL) 12.5 MG tablet Take 12.5  mg by mouth daily.    methocarbamol (ROBAXIN) 500 MG tablet Take 1 tablet (500 mg total) by mouth every 6 (six) hours as needed for muscle spasms.   metoprolol succinate (TOPROL XL) 50 MG 24 hr tablet Take 3 tablets (150 mg total) by mouth daily.   simvastatin (ZOCOR) 20 MG tablet Take 20 mg by mouth at bedtime.     Review of Systems      All other systems reviewed and are otherwise negative except as noted above.  Physical Exam    VS:  BP 110/68   Pulse 70   Ht '5\' 5"'$  (1.651 m)   Wt 170 lb (77.1  kg)   BMI 28.29 kg/m  , BMI Body mass index is 28.29 kg/m.  Wt Readings from Last 3 Encounters:  06/28/22 170 lb (77.1 kg)  01/29/22 170 lb (77.1 kg)  09/08/21 170 lb (77.1 kg)     GEN: Well nourished, well developed, in no acute distress. HEENT: normal. Neck: Supple, no JVD, carotid bruits, or masses. Cardiac: RRR, no murmurs, rubs, or gallops. No clubbing, cyanosis, edema.  Radials/PT 2+ and equal bilaterally.  Respiratory:  Respirations regular and unlabored, clear to auscultation bilaterally. GI: Soft, nontender, nondistended. MS: No deformity or atrophy. Skin: Warm and dry, no rash. Neuro:  Strength and sensation are intact. Psych: Normal affect.  Assessment & Plan    CAD in native artery -Recently seen in the ER and her ischemic work-up was negative.  Troponin was negative.  There were no EKG changes that suggested ischemia.  Coronary artery aneurysm-small change in the size  - CT scan every two years to reevaluate -She is set up to see Dr. Tamala Julian in February  PSVT  -not interested in a long-term monitor at this time -Offered EP referral but declined at this time  Essential hypertension -Well-controlled today -Continue amlodipine 2.5 mg daily, HCTZ 12.5 mg daily, metoprolol succinate 50 mg daily  Mixed hyperlipidemia -She is due to have her lipid panel drawn in the fall -Last lipid panel 10/2021 showed total cholesterol 211, HDL 62, LDL 112, triglycerides 215 -She  remains on simvastatin 20 mg at bedtime      Disposition: Follow up 6 months with Sinclair Grooms, MD or APP.  Signed, Elgie Collard, PA-C 06/28/2022, 12:23 PM [place]

## 2022-06-28 ENCOUNTER — Encounter: Payer: Self-pay | Admitting: Physician Assistant

## 2022-06-28 ENCOUNTER — Ambulatory Visit (INDEPENDENT_AMBULATORY_CARE_PROVIDER_SITE_OTHER): Payer: Medicare Other | Admitting: Physician Assistant

## 2022-06-28 VITALS — BP 110/68 | HR 70 | Ht 65.0 in | Wt 170.0 lb

## 2022-06-28 DIAGNOSIS — I10 Essential (primary) hypertension: Secondary | ICD-10-CM | POA: Diagnosis not present

## 2022-06-28 DIAGNOSIS — I471 Supraventricular tachycardia: Secondary | ICD-10-CM | POA: Diagnosis not present

## 2022-06-28 DIAGNOSIS — E782 Mixed hyperlipidemia: Secondary | ICD-10-CM | POA: Diagnosis not present

## 2022-06-28 DIAGNOSIS — Z720 Tobacco use: Secondary | ICD-10-CM

## 2022-06-28 DIAGNOSIS — I2510 Atherosclerotic heart disease of native coronary artery without angina pectoris: Secondary | ICD-10-CM | POA: Diagnosis not present

## 2022-06-28 DIAGNOSIS — I671 Cerebral aneurysm, nonruptured: Secondary | ICD-10-CM | POA: Diagnosis not present

## 2022-06-28 NOTE — Patient Instructions (Addendum)
Medication Instructions:  Your physician recommends that you continue on your current medications as directed. Please refer to the Current Medication list given to you today.  *If you need a refill on your cardiac medications before your next appointment, please call your pharmacy*   Lab Work: none If you have labs (blood work) drawn today and your tests are completely normal, you will receive your results only by: [place] (if you have MyChart) OR A paper copy in the mail If you have any lab test that is abnormal or we need to change your treatment, we will call you to review the results.   Testing/Procedures: none   Follow-Up: At [facility], you and your health needs are our priority.  As part of our continuing mission to provide you with exceptional heart care, we have created designated Provider Care Teams.  These Care Teams include your primary Cardiologist (physician) and Advanced Practice Providers (APPs -  Physician Assistants and Nurse Practitioners) who all work together to provide you with the care you need, when you need it.  We recommend signing up for the patient portal called "MyChart".  Sign up information is provided on this After Visit Summary.  MyChart is used to connect with patients for Virtual Visits (Telemedicine).  Patients are able to view lab/test results, encounter notes, upcoming appointments, etc.  Non-urgent messages can be sent to your provider as well.   To learn more about what you can do with MyChart, go to [email or web address].    Your next appointment:   Follow up in February 2024  The format for your next appointment:   In Person  Provider:   Sinclair Grooms, MD     Other Instructions    Important Information About Sugar

## 2022-07-12 ENCOUNTER — Telehealth: Payer: Self-pay | Admitting: Pharmacist

## 2022-07-12 NOTE — Telephone Encounter (Signed)
Received a phone call from [facility] about discrepancy between latest NP note and their med list and what patient states she is taking  Note says to continue lotensin and Eliquis, however patient states she has never taken these. Note also says continue crestor and patient is on simvastatin.  Patient's med list matches what [facility] has she is on, however note does not.

## 2022-07-13 NOTE — Telephone Encounter (Signed)
I spoke to patient and verified medications on medication list. Advised that it was our documentation error and it has been fixed.

## 2022-07-19 ENCOUNTER — Telehealth: Payer: Self-pay | Admitting: Interventional Cardiology

## 2022-07-19 DIAGNOSIS — E7800 Pure hypercholesterolemia, unspecified: Secondary | ICD-10-CM | POA: Diagnosis not present

## 2022-07-19 DIAGNOSIS — I10 Essential (primary) hypertension: Secondary | ICD-10-CM | POA: Diagnosis not present

## 2022-07-19 NOTE — Telephone Encounter (Signed)
Left message for patient to call back  

## 2022-07-19 NOTE — Telephone Encounter (Signed)
[facility] calling wanting to clarify patient is suppose to be taking metoprolol succinate 150 mg (3 tablets) daily. Will forward to last provider who saw patient in office. Once clarify, please send in new prescription for 90 days to [facility].

## 2022-07-19 NOTE — Telephone Encounter (Signed)
New Message:    Please call Kennitra. She needs to review all patient's medicine in detail please.

## 2022-07-19 NOTE — Telephone Encounter (Signed)
Returning nurses call. She states that she will be going to lunch at 1:00. Please advise

## 2022-07-20 MED ORDER — METOPROLOL SUCCINATE ER 50 MG PO TB24: 50.0000 mg | ORAL_TABLET | Freq: Every day | ORAL | 3 refills | Status: DC

## 2022-07-20 NOTE — Telephone Encounter (Signed)
Clarified with Nicholes Rough PA and Pharm D, Megan Supple. Will send Metoprolol Succinate 50 mg by mouth daily to patient's pharmacy.

## 2022-07-26 ENCOUNTER — Ambulatory Visit (INDEPENDENT_AMBULATORY_CARE_PROVIDER_SITE_OTHER): Payer: Medicare Other

## 2022-07-26 ENCOUNTER — Encounter: Payer: Self-pay | Admitting: Podiatry

## 2022-07-26 ENCOUNTER — Ambulatory Visit (INDEPENDENT_AMBULATORY_CARE_PROVIDER_SITE_OTHER): Payer: Medicare Other | Admitting: Podiatry

## 2022-07-26 DIAGNOSIS — R52 Pain, unspecified: Secondary | ICD-10-CM

## 2022-07-26 DIAGNOSIS — I2510 Atherosclerotic heart disease of native coronary artery without angina pectoris: Secondary | ICD-10-CM

## 2022-07-26 DIAGNOSIS — M7751 Other enthesopathy of right foot: Secondary | ICD-10-CM

## 2022-07-26 DIAGNOSIS — M205X1 Other deformities of toe(s) (acquired), right foot: Secondary | ICD-10-CM

## 2022-07-26 MED ORDER — TRIAMCINOLONE ACETONIDE 10 MG/ML IJ SUSP
10.0000 mg | Freq: Once | INTRAMUSCULAR | Status: AC
  Administered 2022-07-26: 10 mg

## 2022-07-27 DIAGNOSIS — I10 Essential (primary) hypertension: Secondary | ICD-10-CM | POA: Diagnosis not present

## 2022-07-27 DIAGNOSIS — M25511 Pain in right shoulder: Secondary | ICD-10-CM | POA: Diagnosis not present

## 2022-07-27 DIAGNOSIS — M25512 Pain in left shoulder: Secondary | ICD-10-CM | POA: Diagnosis not present

## 2022-07-28 NOTE — Progress Notes (Signed)
Subjective:   Patient ID: Stephanie Newton, female   DOB: 81 y.o.   MRN: [id number]   HPI Patient presents stating she is getting pain around her big toe joint and also into the second joint right foot and states that she does feel like she is lost motion of the big toe joint right foot   ROS      Objective:  Physical Exam  Neurovascular status was found to be intact with patient found to have inflammation pain around the lateral side of the first MPJ dorsal and into the second MPJ.  Does have range of motion loss of the first MPJ crepitus of the joint dorsal spurring noted     Assessment:  Combination of inflammatory hallux limitus rigidus deformity right with structural changes of the joint surface along with inflamed capsule of the first and second MPJ     Plan:  H&P education rendered and discussed the possibility that this may require surgical intervention with probable implant procedure.  At this point we will get a try conservative but I did inject the lateral side of the first MPJ and around the second MPJ periarticular and across the dorsal surface first 3 mg Dexasone Kenalog 5 mg Xylocaine advised on rigid bottom shoes and we can make a decision based on response but again this may be the type that event over the last short-term may require surgical intervention which I educated her on today and recovery  X-rays indicate that there is spurring around the first MPJ right narrowing of the joint surface no other pathology was noted with indications of arthritic condition with loss of cartilage

## 2022-08-27 DIAGNOSIS — Z23 Encounter for immunization: Secondary | ICD-10-CM | POA: Diagnosis not present

## 2022-09-28 ENCOUNTER — Telehealth: Payer: Self-pay | Admitting: Interventional Cardiology

## 2022-09-28 DIAGNOSIS — I10 Essential (primary) hypertension: Secondary | ICD-10-CM

## 2022-09-28 DIAGNOSIS — E7800 Pure hypercholesterolemia, unspecified: Secondary | ICD-10-CM | POA: Diagnosis not present

## 2022-09-28 DIAGNOSIS — I2510 Atherosclerotic heart disease of native coronary artery without angina pectoris: Secondary | ICD-10-CM

## 2022-09-28 NOTE — Telephone Encounter (Signed)
Returned call to patient. Informed her Dr. Tamala Julian is out of the office for the next 2 weeks.  Will forward message to Dr. Tamala Julian to review and advise on any recommended testing prior to appt with Dr. Harrington Challenger in February.  Patient verbalized understanding and expressed appreciation for follow-up.

## 2022-09-28 NOTE — Telephone Encounter (Signed)
Patient would like to know if Dr. Tamala Julian recommends patient have an updated CT or any other testing prior to 01/25/23 appointment with Dr. Harrington Challenger.

## 2022-10-13 MED ORDER — METOPROLOL TARTRATE 100 MG PO TABS: 100.0000 mg | ORAL_TABLET | Freq: Once | ORAL | 0 refills | Status: DC

## 2022-10-13 NOTE — Telephone Encounter (Signed)
Left message for patient informing her Dr. Tamala Julian recommends she have a coronary CTA performed prior to the end of the year.  Reviewed instructions, will also send to MyChart.  Informed her she will need to schedule an appointment for labwork within the next 2 weeks for BMET.  A scheduler will contact her to setup appointment date and time.  Provided office number for callback.

## 2022-10-19 ENCOUNTER — Ambulatory Visit: Payer: Medicare Other | Attending: Interventional Cardiology

## 2022-10-19 DIAGNOSIS — I10 Essential (primary) hypertension: Secondary | ICD-10-CM | POA: Diagnosis not present

## 2022-10-19 LAB — BASIC METABOLIC PANEL
BUN/Creatinine Ratio: 14 (ref 12–28)
BUN: 13 mg/dL (ref 8–27)
CO2: 24 mmol/L (ref 20–29)
Calcium: 10.1 mg/dL (ref 8.7–10.3)
Chloride: 102 mmol/L (ref 96–106)
Creatinine, Ser: 0.94 mg/dL (ref 0.57–1.00)
Glucose: 90 mg/dL (ref 70–99)
Potassium: 4 mmol/L (ref 3.5–5.2)
Sodium: 139 mmol/L (ref 134–144)
eGFR: 62 mL/min/{1.73_m2} (ref >59)

## 2022-10-25 ENCOUNTER — Telehealth (HOSPITAL_COMMUNITY): Payer: Self-pay | Admitting: *Deleted

## 2022-10-25 NOTE — Telephone Encounter (Signed)
Reaching out to patient to offer assistance regarding upcoming cardiac imaging study; pt verbalizes understanding of appt date/time, parking situation and where to check in, medications ordered, and verified current allergies; name and call back number provided for further questions should they arise  Gordy Clement RN Navigator Cardiac Imaging [facility] [phone] office [phone] cell  Patient to hold her daily metoprolol succinate and take '100mg'$  metoprolol tartrate two hours prior to her cardiac CT scan. She is aware to arrive at 2pm.

## 2022-10-26 ENCOUNTER — Ambulatory Visit (HOSPITAL_COMMUNITY)
Admission: RE | Admit: 2022-10-26 | Discharge: 2022-10-26 | Disposition: A | Discharge status: Home / Self Care | Payer: Medicare Other | Source: Ambulatory Visit | Attending: Interventional Cardiology | Admitting: Interventional Cardiology

## 2022-10-26 DIAGNOSIS — I2510 Atherosclerotic heart disease of native coronary artery without angina pectoris: Secondary | ICD-10-CM | POA: Insufficient documentation

## 2022-10-26 MED ORDER — NITROGLYCERIN 0.4 MG SL SUBL
SUBLINGUAL_TABLET | SUBLINGUAL | Status: AC
  Filled 2022-10-26: qty 2

## 2022-10-26 MED ORDER — IOHEXOL 350 MG/ML SOLN
100.0000 mL | Freq: Once | INTRAVENOUS | Status: AC | PRN
  Administered 2022-10-26: 100 mL via INTRAVENOUS

## 2022-10-26 MED ORDER — NITROGLYCERIN 0.4 MG SL SUBL
0.8000 mg | SUBLINGUAL_TABLET | Freq: Once | SUBLINGUAL | Status: AC
  Administered 2022-10-26: 0.8 mg via SUBLINGUAL

## 2022-11-07 NOTE — Progress Notes (Unsigned)
Cardiology Office Note:    Date:  11/08/2022   ID:  Stephanie Newton, DOB 07-07-1941, MRN [id number]  PCP:  Lajean Manes, MD  Cardiologist:  Sinclair Grooms, MD   Referring MD: Lajean Manes, MD   Chief Complaint  Patient presents with   Coronary Artery Disease    History of Present Illness:    Stephanie Newton is a 81 y.o. female with a hx of  hyperlipidemia, primary hypertension, tobacco use, NSTEMI, PSVT, and LM saccular aneurysm.   She has a known distal left main saccular aneurysm that has been followed by serial coronary CTA procedures.  See the most recent study results below.  Current measurements are 13 x 10 x 10 mm on 10/26/2022; 12 x 10 x 9 mm on 11/24/2021; 12 x 10 x 8 mm on 11/18/2020; 8.5 x 8.5 x 10 mm on 12/18/2018.  Each study was interpreted by a different imager.  She has fleeting chest discomfort the last seconds.  Never frightening.  She has had episodes of SVT  that produce symptoms of 4-week notice and on occasion have required an emergency room visit with the most recent occurrence 09/08/2021.  Past Medical History:  Diagnosis Date   Hypercholesteremia    Hypertension     Past Surgical History:  Procedure Laterality Date   BREAST BIOPSY     bilateral    EYE SURGERY     cataract bilateral    HERNIA REPAIR  06/2000   INCISIONAL VENTRAL ; DR Nedra Hai    KNEE ARTHROSCOPY Left 08/23/2017   dr Theda Sers    LAPAROSCOPIC CHOLECYSTECTOMY  06/13/2000   DR DOUG [facility]    LEFT HEART CATH AND CORONARY ANGIOGRAPHY N/A 12/04/2018   Procedure: LEFT HEART CATH AND CORONARY ANGIOGRAPHY;  Surgeon: Belva Crome, MD;  Location: [place] [facility];  Service: Cardiovascular;  Laterality: N/A;   TOTAL KNEE ARTHROPLASTY Left 03/31/2018   Procedure: LEFT TOTAL KNEE ARTHROPLASTY;  Surgeon: Sydnee Cabal, MD;  Location: [facility];  Service: Orthopedics;  Laterality: Left;  with block   TOTAL KNEE ARTHROPLASTY Right 04/30/2020   Procedure: RIGHT TOTAL KNEE  ARTHROPLASTY;  Surgeon: Gaynelle Arabian, MD;  Location: [facility];  Service: Orthopedics;  Laterality: Right;  37mn   VAGINAL HYSTERECTOMY  age [age]s   Current Medications: Current Meds  Medication Sig   amLODipine (NORVASC) 2.5 MG tablet Take 2.5 mg by mouth daily.   aspirin EC 81 MG tablet Take 1 tablet by mouth daily.   estradiol (ESTRACE) 1 MG tablet Take 1 mg by mouth daily.   famotidine (PEPCID) 20 MG tablet Take 20 mg by mouth as needed.   hydrochlorothiazide (HYDRODIURIL) 12.5 MG tablet Take 12.5 mg by mouth daily.    methocarbamol (ROBAXIN) 500 MG tablet Take 1 tablet (500 mg total) by mouth every 6 (six) hours as needed for muscle spasms.   metoprolol succinate (TOPROL XL) 50 MG 24 hr tablet Take 1 tablet (50 mg total) by mouth daily.   simvastatin (ZOCOR) 20 MG tablet Take 20 mg by mouth at bedtime.     Allergies:   Shellfish allergy   Social History   Socioeconomic History   Marital status: Married    Spouse name: Not on file   Number of children: Not on file   Years of education: Not on file   Highest education level: Not on file  Occupational History   Not on file  Tobacco Use   Smoking status: Former  Years: 30.00    Types: Cigarettes   Smokeless tobacco: Never   Tobacco comments:    6 cigarettes a day  Vaping Use   Vaping Use: Never used  Substance and Sexual Activity   Alcohol use: Yes    Comment: occasionally    Drug use: No   Sexual activity: Yes  Other Topics Concern   Not on file  Social History Narrative   Not on file   Social Determinants of Health   Financial Resource Strain: Unknown (12/04/2018)   Overall Financial Resource Strain (CARDIA)    Difficulty of Paying Living Expenses: Patient refused  Food Insecurity: Unknown (12/04/2018)   Hunger Vital Sign    Worried About Running Out of Food in the Last Year: Patient refused    [place] in the Last Year: Patient refused  Transportation Needs: Unknown (12/04/2018)   [place] -  Hydrologist (Medical): Patient refused    Lack of Transportation (Non-Medical): Patient refused  Physical Activity: Unknown (12/04/2018)   Exercise Vital Sign    Days of Exercise per Week: Patient refused    Minutes of Exercise per Session: Patient refused  Stress: Unknown (12/04/2018)   [place] of Stress : Patient refused  Social Connections: Unknown (12/04/2018)   Social Connection and Isolation Panel [NHANES]    Frequency of Communication with Friends and Family: Patient refused    Frequency of Social Gatherings with Friends and Family: Patient refused    Attends Religious Services: Patient refused    Marine scientist or Organizations: Patient refused    Attends Archivist Meetings: Patient refused    Marital Status: Patient refused     Family History: The patient's family history is not on file.  ROS:   Please see the history of present illness.    No complaints.  Not as physically active as she would like to be because of significant bilateral knee arthritis.  All other systems reviewed and are negative.  EKGs/Labs/Other Studies Reviewed:    The following studies were reviewed today:  CORONARY CTA 2023: IMPRESSION: 1. Left main saccular aneurysm again demonstrated, 13 x 10 x 10 mm. Accounting for inter-reader variability, this may represent slight increase in measurements. No plaque, stenosis, or thrombus in the LM.   2. Minimal CAD, <25% stenosis, CADRADS 1. Coronary calcium score of 0.   3. Normal coronary origins with right dominance.   4. 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   5.  Small patent foramen ovale with left to right shunt.   EKG:  EKG not repeated  Recent Labs: 06/12/2022: B Natriuretic Peptide 55.0; Hemoglobin 14.1; Platelets 268 10/19/2022: BUN 13; Creatinine, Ser 0.94; Potassium 4.0;  Sodium 139  Recent Lipid Panel    Component Value Date/Time   CHOL 193 12/03/2018 1447   TRIG 211 (H) 12/03/2018 1447   HDL 77 12/03/2018 1447   CHOLHDL 2.5 12/03/2018 1447   VLDL 42 (H) 12/03/2018 1447   LDLCALC 74 12/03/2018 1447    Physical Exam:    VS:  BP 116/68   Pulse 67   Ht '5\' 5"'$  (1.651 m)   Wt 176 lb 9.6 oz (80.1 kg)   SpO2 97%   BMI 29.39 kg/m     Wt Readings from Last 3 Encounters:  11/08/22 176 lb 9.6 oz (80.1 kg)  06/28/22 170 lb (77.1 kg)  01/29/22  170 lb (77.1 kg)     GEN: Slightly overweight. No acute distress HEENT: Normal NECK: No JVD. LYMPHATICS: No lymphadenopathy CARDIAC: No murmur. RRR no gallop, or edema. VASCULAR:  Normal Pulses. No bruits. RESPIRATORY:  Clear to auscultation without rales, wheezing or rhonchi  ABDOMEN: Soft, non-tender, non-distended, No pulsatile mass, MUSCULOSKELETAL: No deformity  SKIN: Warm and dry NEUROLOGIC:  Alert and oriented x 3 PSYCHIATRIC:  Normal affect   ASSESSMENT:    1. Coronary artery aneurysm   2. CAD in native artery   3. Essential hypertension   4. Mixed hyperlipidemia   5. Tobacco use   6. PSVT (paroxysmal supraventricular tachycardia)    PLAN:    In order of problems listed above:  Saccular aneurysm as outlined above in H&P.  At this point I am still recommending conservative management strategy in absence of symptoms or until "giant (20 mm)" status is present.  The location of her aneurysm would likely require surgical ligation of the left main and both the proximal LAD and circumflex with grafting performed distal to the ligations.  In the meantime we will continue to control blood pressure, use antiplatelet therapy, consider anticoagulation therapy if ACS, and monitor for symptoms.  At some point she will need a [facility] presentation to discuss the potential treatment options that exist at [facility] health.  If there are not good options, perhaps referral to a place with more experience  should occur.  She leans towards conservative management. Not having angina Excellent blood pressure control Continue to treat lipids with Zocor keeping LDL less than 70 No longer smokes Under control with Toprol-XL.   Dr. Willa Frater follow-up in 4 months.  May need heart team presentation.   Medication Adjustments/Labs and Tests Ordered: Current medicines are reviewed at length with the patient today.  Concerns regarding medicines are outlined above.  No orders of the defined types were placed in this encounter.  No orders of the defined types were placed in this encounter.   Patient Instructions  Medication Instructions:  Your physician recommends that you continue on your current medications as directed. Please refer to the Current Medication list given to you today.  *If you need a refill on your cardiac medications before your next appointment, please call your pharmacy*  Follow-Up: At [facility], you and your health needs are our priority.  As part of our continuing mission to provide you with exceptional heart care, we have created designated Provider Care Teams.  These Care Teams include your primary Cardiologist (physician) and Advanced Practice Providers (APPs -  Physician Assistants and Nurse Practitioners) who all work together to provide you with the care you need, when you need it.  Your next appointment:   3-5 month(s)  The format for your next appointment:   In Person  Provider:   Larae Grooms, MD  Important Information About Sugar         Signed, Sinclair Grooms, MD  11/08/2022 3:56 PM    [place]

## 2022-11-08 ENCOUNTER — Ambulatory Visit: Payer: Medicare Other | Attending: Interventional Cardiology | Admitting: Interventional Cardiology

## 2022-11-08 ENCOUNTER — Encounter: Payer: Self-pay | Admitting: Interventional Cardiology

## 2022-11-08 VITALS — BP 116/68 | HR 67 | Ht 65.0 in | Wt 176.6 lb

## 2022-11-08 DIAGNOSIS — Z720 Tobacco use: Secondary | ICD-10-CM | POA: Diagnosis not present

## 2022-11-08 DIAGNOSIS — E782 Mixed hyperlipidemia: Secondary | ICD-10-CM | POA: Diagnosis not present

## 2022-11-08 DIAGNOSIS — I471 Supraventricular tachycardia, unspecified: Secondary | ICD-10-CM | POA: Diagnosis not present

## 2022-11-08 DIAGNOSIS — I2541 Coronary artery aneurysm: Secondary | ICD-10-CM | POA: Insufficient documentation

## 2022-11-08 DIAGNOSIS — I10 Essential (primary) hypertension: Secondary | ICD-10-CM | POA: Insufficient documentation

## 2022-11-08 DIAGNOSIS — I2510 Atherosclerotic heart disease of native coronary artery without angina pectoris: Secondary | ICD-10-CM | POA: Insufficient documentation

## 2022-11-08 NOTE — Patient Instructions (Signed)
Medication Instructions:  Your physician recommends that you continue on your current medications as directed. Please refer to the Current Medication list given to you today.  *If you need a refill on your cardiac medications before your next appointment, please call your pharmacy*  Follow-Up: At [facility], you and your health needs are our priority.  As part of our continuing mission to provide you with exceptional heart care, we have created designated Provider Care Teams.  These Care Teams include your primary Cardiologist (physician) and Advanced Practice Providers (APPs -  Physician Assistants and Nurse Practitioners) who all work together to provide you with the care you need, when you need it.  Your next appointment:   3-5 month(s)  The format for your next appointment:   In Person  Provider:   Larae Grooms, MD  Important Information About Sugar

## 2023-01-25 ENCOUNTER — Ambulatory Visit: Payer: Medicare Other | Admitting: Internal Medicine

## 2023-02-08 NOTE — Progress Notes (Unsigned)
Cardiology Office Note   Date:  02/09/2023   ID:  Stephanie Newton, Stephanie Newton May 27, 1941, MRN [id number]  PCP:  Stephanie Frames, MD    No chief complaint on file.  CAD  Wt Readings from Last 3 Encounters:  02/09/23 169 lb 12.8 oz (77 kg)  11/08/22 176 lb 9.6 oz (80.1 kg)  06/28/22 170 lb (77.1 kg)       History of Present Illness: Stephanie Newton is a 82 y.o. female former patient of Dr. Tamala Julian  with a hx of  hyperlipidemia, primary hypertension, tobacco use, NSTEMI, PSVT, and LM saccular aneurysm.   She has a known distal left main saccular aneurysm that has been followed by serial coronary CTA procedures.  See the most recent study results below.  Current measurements are 13 x 10 x 10 mm on 10/26/2022; 12 x 10 x 9 mm on 11/24/2021; 12 x 10 x 8 mm on 11/18/2020; 8.5 x 8.5 x 10 mm on 12/18/2018.  Each study was interpreted by a different imager.   As of 2023: "She has fleeting chest discomfort the last seconds.  Never frightening. She has had episodes of SVT  that produce symptoms of 4-week notice and on occasion have required an emergency room visit with the most recent occurrence 09/08/2021."  Minimal CAD on most recent coronary CTA in 2023.  Small PFO noted as well.  Plan per Dr. Tamala Julian in 2023 was: "conservative management strategy in absence of symptoms or until "giant (20 mm)" status is present.  The location of her aneurysm would likely require surgical ligation of the left main and both the proximal LAD and circumflex with grafting performed distal to the ligations.  In the meantime we will continue to control blood pressure, use antiplatelet therapy, consider anticoagulation therapy if ACS, and monitor for symptoms. "  Sx have not changed in the past few months.   Past Medical History:  Diagnosis Date   Hypercholesteremia    Hypertension     Past Surgical History:  Procedure Laterality Date   BREAST BIOPSY     bilateral    EYE SURGERY     cataract bilateral     HERNIA REPAIR  06/2000   INCISIONAL VENTRAL ; DR Nedra Hai    KNEE ARTHROSCOPY Left 08/23/2017   dr Theda Sers    LAPAROSCOPIC CHOLECYSTECTOMY  06/13/2000   DR DOUG [facility]    LEFT HEART CATH AND CORONARY ANGIOGRAPHY N/A 12/04/2018   Procedure: LEFT HEART CATH AND CORONARY ANGIOGRAPHY;  Surgeon: Belva Crome, MD;  Location: [place] [facility];  Service: Cardiovascular;  Laterality: N/A;   TOTAL KNEE ARTHROPLASTY Left 03/31/2018   Procedure: LEFT TOTAL KNEE ARTHROPLASTY;  Surgeon: Sydnee Cabal, MD;  Location: [facility];  Service: Orthopedics;  Laterality: Left;  with block   TOTAL KNEE ARTHROPLASTY Right 04/30/2020   Procedure: RIGHT TOTAL KNEE ARTHROPLASTY;  Surgeon: Gaynelle Arabian, MD;  Location: [facility];  Service: Orthopedics;  Laterality: Right;  10mn   VAGINAL HYSTERECTOMY  age [age]s    Current Outpatient Medications  Medication Sig Dispense Refill   amLODipine (NORVASC) 2.5 MG tablet Take 2.5 mg by mouth daily.     aspirin EC 81 MG tablet Take 1 tablet by mouth daily.     estradiol (ESTRACE) 1 MG tablet Take 1 mg by mouth daily.     famotidine (PEPCID) 20 MG tablet Take 20 mg by mouth as needed.     hydrochlorothiazide (HYDRODIURIL) 12.5 MG tablet Take  12.5 mg by mouth daily.      methocarbamol (ROBAXIN) 500 MG tablet Take 1 tablet (500 mg total) by mouth every 6 (six) hours as needed for muscle spasms. 40 tablet 0   metoprolol succinate (TOPROL XL) 50 MG 24 hr tablet Take 1 tablet (50 mg total) by mouth daily. 90 tablet 3   metoprolol succinate (TOPROL-XL) 100 MG 24 hr tablet Take 100 mg by mouth daily.     simvastatin (ZOCOR) 20 MG tablet Take 20 mg by mouth at bedtime.     traMADol (ULTRAM) 50 MG tablet Take 50-100 mg by mouth every 6 (six) hours as needed.     No current facility-administered medications for this visit.    Allergies:   Shellfish allergy    Social History:  The patient  reports that she has quit smoking. Her smoking use included cigarettes. She has  never used smokeless tobacco. She reports current alcohol use. She reports that she does not use drugs.   Family History:  The patient's family history includes Heart attack in her mother; Pneumonia in her brother.    ROS:  Please see the history of present illness.   Otherwise, review of systems are positive for some stress from the knowledge of the aneurysm.   All other systems are reviewed and negative.    PHYSICAL EXAM: VS:  BP 132/74   Pulse 65   Ht '5\' 5"'$  (1.651 m)   Wt 169 lb 12.8 oz (77 kg)   SpO2 96%   BMI 28.26 kg/m  , BMI Body mass index is 28.26 kg/m. GEN: Well nourished, well developed, in no acute distress HEENT: normal Neck: no JVD, carotid bruits, or masses Cardiac: RRR; no murmurs, rubs, or gallops,no edema  Respiratory:  clear to auscultation bilaterally, normal work of breathing GI: soft, nontender, nondistended, + BS MS: no deformity or atrophy Skin: warm and dry, no rash Neuro:  Strength and sensation are intact Psych: euthymic mood, full affect   EKG:   The ekg ordered today demonstrates NSR, no ST changes   Recent Labs: 06/12/2022: B Natriuretic Peptide 55.0; Hemoglobin 14.1; Platelets 268 10/19/2022: BUN 13; Creatinine, Ser 0.94; Potassium 4.0; Sodium 139   Lipid Panel    Component Value Date/Time   CHOL 193 12/03/2018 1447   TRIG 211 (H) 12/03/2018 1447   HDL 77 12/03/2018 1447   CHOLHDL 2.5 12/03/2018 1447   VLDL 42 (H) 12/03/2018 1447   LDLCALC 74 12/03/2018 1447     Other studies Reviewed: Additional studies/ records that were reviewed today with results demonstrating: labs reviewed.   ASSESSMENT AND PLAN:  Coronary artery aneurysm: No atherosclerosis noted on cath.  Cath films reviewed.   Plan for repeat CTA in November 2024. HTN: The current medical regimen is effective;  continue present plan and medications. Hyperlipidemia: Total cholesterol 143 LDL 59 HDL 66 triglycerides 97 Prediabetes A1c 6.2 in February 2023. Whole food, plant  based diet.  High fiber diet.      Current medicines are reviewed at length with the patient today.  The patient concerns regarding her medicines were addressed.  The following changes have been made:  No change  Labs/ tests ordered today include:   Orders Placed This Encounter  Procedures   CT CORONARY MORPH W/CTA COR W/SCORE W/CA W/CM &/OR WO/CM   Basic Metabolic Panel (BMET)    Recommend 150 minutes/week of aerobic exercise Low fat, low carb, high fiber diet recommended  Disposition:   FU in 1 year  Signed, Larae Grooms, MD  02/09/2023 4:48 PM    [facility] [place], [place], [place]  [address] Phone: [phone]; Fax: [phone]

## 2023-02-09 ENCOUNTER — Ambulatory Visit: Payer: Medicare Other | Attending: Interventional Cardiology | Admitting: Interventional Cardiology

## 2023-02-09 ENCOUNTER — Encounter: Payer: Self-pay | Admitting: Interventional Cardiology

## 2023-02-09 VITALS — BP 132/74 | HR 65 | Ht 65.0 in | Wt 169.8 lb

## 2023-02-09 DIAGNOSIS — I2541 Coronary artery aneurysm: Secondary | ICD-10-CM | POA: Insufficient documentation

## 2023-02-09 DIAGNOSIS — I10 Essential (primary) hypertension: Secondary | ICD-10-CM | POA: Diagnosis not present

## 2023-02-09 DIAGNOSIS — E782 Mixed hyperlipidemia: Secondary | ICD-10-CM | POA: Diagnosis not present

## 2023-02-09 DIAGNOSIS — R7303 Prediabetes: Secondary | ICD-10-CM | POA: Insufficient documentation

## 2023-02-09 NOTE — Patient Instructions (Addendum)
Medication Instructions:  Your physician recommends that you continue on your current medications as directed. Please refer to the Current Medication list given to you today.  *If you need a refill on your cardiac medications before your next appointment, please call your pharmacy*   Lab Work: None at this time.  You will need a BMP prior to CT Scan in November If you have labs (blood work) drawn today and your tests are completely normal, you will receive your results only by: [place] (if you have MyChart) OR A paper copy in the mail If you have any lab test that is abnormal or we need to change your treatment, we will call you to review the results.   Testing/Procedures: Your physician has requested that you have cardiac CT. Cardiac computed tomography (CT) is a painless test that uses an x-ray machine to take clear, detailed pictures of your heart. For further information please visit [email or web address]. Please follow instruction sheet as given. To be done in November   Follow-Up: At [facility], you and your health needs are our priority.  As part of our continuing mission to provide you with exceptional heart care, we have created designated Provider Care Teams.  These Care Teams include your primary Cardiologist (physician) and Advanced Practice Providers (APPs -  Physician Assistants and Nurse Practitioners) who all work together to provide you with the care you need, when you need it.  We recommend signing up for the patient portal called "MyChart".  Sign up information is provided on this After Visit Summary.  MyChart is used to connect with patients for Virtual Visits (Telemedicine).  Patients are able to view lab/test results, encounter notes, upcoming appointments, etc.  Non-urgent messages can be sent to your provider as well.   To learn more about what you can do with MyChart, go to [email or web address].    Your next appointment:   12  month(s)  Provider:   Larae Grooms, MD     Other Instructions    Your cardiac CT will be scheduled at one of the below locations:   [facility] [address] [place], [place] [address] [phone]  [facility] [address] [place], [place] [address] [phone]  [facility] [place], [place] [address] [phone]  If scheduled at [facility], please arrive at the [facility] and Children's Entrance (Entrance C2) of [facility] 30 minutes prior to test start time. You can use the FREE valet parking offered at entrance C (encouraged to control the heart rate for the test)  Proceed to the [facility] Radiology Department (first floor) to check-in and test prep.  All radiology patients and guests should use entrance C2 at [facility], accessed from [facility], even though the hospital's physical address listed is [address].    If scheduled at [facility] or [facility], please arrive 15 mins early for check-in and test prep.   Please follow these instructions carefully (unless otherwise directed):    On the Night Before the Test: Be sure to Drink plenty of water. Do not consume any caffeinated/decaffeinated beverages or chocolate 12 hours prior to your test. Do not take any antihistamines 12 hours prior to your test.   On the Day of the Test: Drink plenty of water until 1 hour prior to the test. Do not eat any food  1 hour prior to test. You may take your regular medications prior to the test.  Take your morning dose of Toprol 2 hours prior to CT Scan If you take Furosemide/Hydrochlorothiazide/Spironolactone, please HOLD on the morning of the test. FEMALES- please wear underwire-free bra if available, avoid dresses & tight clothing       After the  Test: Drink plenty of water. After receiving IV contrast, you may experience a mild flushed feeling. This is normal. On occasion, you may experience a mild rash up to 24 hours after the test. This is not dangerous. If this occurs, you can take Benadryl 25 mg and increase your fluid intake. If you experience trouble breathing, this can be serious. If it is severe call 911 IMMEDIATELY. If it is mild, please call our office. If you take any of these medications: Glipizide/Metformin, Avandament, Glucavance, please do not take 48 hours after completing test unless otherwise instructed.  We will call to schedule your test 2-4 weeks out understanding that some insurance companies will need an authorization prior to the service being performed.   For non-scheduling related questions, please contact the cardiac imaging nurse navigator should you have any questions/concerns: Marchia Bond, Cardiac Imaging Nurse Navigator Gordy Clement, Cardiac Imaging Nurse Navigator [facility] Direct Office Dial: [phone]   For scheduling needs, including cancellations and rescheduling, please call [place], [phone].

## 2023-02-14 ENCOUNTER — Other Ambulatory Visit: Payer: Self-pay | Admitting: Internal Medicine

## 2023-02-14 DIAGNOSIS — Z1231 Encounter for screening mammogram for malignant neoplasm of breast: Secondary | ICD-10-CM

## 2023-02-28 DIAGNOSIS — H11441 Conjunctival cysts, right eye: Secondary | ICD-10-CM | POA: Diagnosis not present

## 2023-03-11 DIAGNOSIS — K2100 Gastro-esophageal reflux disease with esophagitis, without bleeding: Secondary | ICD-10-CM | POA: Diagnosis not present

## 2023-03-11 DIAGNOSIS — M2550 Pain in unspecified joint: Secondary | ICD-10-CM | POA: Diagnosis not present

## 2023-03-11 DIAGNOSIS — R739 Hyperglycemia, unspecified: Secondary | ICD-10-CM | POA: Diagnosis not present

## 2023-03-11 DIAGNOSIS — R7309 Other abnormal glucose: Secondary | ICD-10-CM | POA: Diagnosis not present

## 2023-03-11 DIAGNOSIS — Z0000 Encounter for general adult medical examination without abnormal findings: Secondary | ICD-10-CM | POA: Diagnosis not present

## 2023-03-11 DIAGNOSIS — I471 Supraventricular tachycardia, unspecified: Secondary | ICD-10-CM | POA: Diagnosis not present

## 2023-03-11 DIAGNOSIS — I10 Essential (primary) hypertension: Secondary | ICD-10-CM | POA: Diagnosis not present

## 2023-03-11 DIAGNOSIS — Z79899 Other long term (current) drug therapy: Secondary | ICD-10-CM | POA: Diagnosis not present

## 2023-03-11 DIAGNOSIS — E7800 Pure hypercholesterolemia, unspecified: Secondary | ICD-10-CM | POA: Diagnosis not present

## 2023-03-11 DIAGNOSIS — Z1331 Encounter for screening for depression: Secondary | ICD-10-CM | POA: Diagnosis not present

## 2023-03-11 DIAGNOSIS — K760 Fatty (change of) liver, not elsewhere classified: Secondary | ICD-10-CM | POA: Diagnosis not present

## 2023-03-29 ENCOUNTER — Ambulatory Visit
Admission: RE | Admit: 2023-03-29 | Discharge: 2023-03-29 | Disposition: A | Discharge status: Home / Self Care | Payer: Medicare Other | Source: Ambulatory Visit | Attending: Internal Medicine | Admitting: Internal Medicine

## 2023-03-29 DIAGNOSIS — Z1231 Encounter for screening mammogram for malignant neoplasm of breast: Secondary | ICD-10-CM | POA: Diagnosis not present

## 2023-04-01 ENCOUNTER — Other Ambulatory Visit: Payer: Self-pay | Admitting: Internal Medicine

## 2023-04-01 DIAGNOSIS — R928 Other abnormal and inconclusive findings on diagnostic imaging of breast: Secondary | ICD-10-CM

## 2023-04-13 ENCOUNTER — Ambulatory Visit
Admission: RE | Admit: 2023-04-13 | Discharge: 2023-04-13 | Disposition: A | Discharge status: Home / Self Care | Payer: Medicare Other | Source: Ambulatory Visit | Attending: Internal Medicine | Admitting: Internal Medicine

## 2023-04-13 DIAGNOSIS — R928 Other abnormal and inconclusive findings on diagnostic imaging of breast: Secondary | ICD-10-CM

## 2023-04-13 DIAGNOSIS — Z1231 Encounter for screening mammogram for malignant neoplasm of breast: Secondary | ICD-10-CM | POA: Diagnosis not present

## 2023-04-13 DIAGNOSIS — R921 Mammographic calcification found on diagnostic imaging of breast: Secondary | ICD-10-CM | POA: Diagnosis not present

## 2023-05-18 ENCOUNTER — Ambulatory Visit (INDEPENDENT_AMBULATORY_CARE_PROVIDER_SITE_OTHER): Payer: Medicare Other

## 2023-05-18 ENCOUNTER — Ambulatory Visit (INDEPENDENT_AMBULATORY_CARE_PROVIDER_SITE_OTHER): Payer: Medicare Other | Admitting: Podiatry

## 2023-05-18 ENCOUNTER — Encounter: Payer: Self-pay | Admitting: Podiatry

## 2023-05-18 DIAGNOSIS — M7751 Other enthesopathy of right foot: Secondary | ICD-10-CM

## 2023-05-18 DIAGNOSIS — R52 Pain, unspecified: Secondary | ICD-10-CM

## 2023-05-18 MED ORDER — TRIAMCINOLONE ACETONIDE 10 MG/ML IJ SUSP
10.0000 mg | Freq: Once | INTRAMUSCULAR | Status: AC
Start: 2023-05-18 — End: 2023-05-18
  Administered 2023-05-18: 10 mg

## 2023-05-19 NOTE — Progress Notes (Signed)
Subjective:   Patient ID: Stephanie Newton, female   DOB: 82 y.o.   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

## 2023-05-23 ENCOUNTER — Other Ambulatory Visit: Payer: Self-pay | Admitting: Podiatry

## 2023-05-23 DIAGNOSIS — M7751 Other enthesopathy of right foot: Secondary | ICD-10-CM

## 2023-05-23 DIAGNOSIS — R52 Pain, unspecified: Secondary | ICD-10-CM

## 2023-06-25 ENCOUNTER — Emergency Department (HOSPITAL_BASED_OUTPATIENT_CLINIC_OR_DEPARTMENT_OTHER)
Admission: EM | Admit: 2023-06-25 | Discharge: 2023-06-25 | Disposition: A | Discharge status: Home / Self Care | Payer: Medicare Other | Attending: Emergency Medicine | Admitting: Emergency Medicine

## 2023-06-25 ENCOUNTER — Other Ambulatory Visit: Payer: Self-pay

## 2023-06-25 ENCOUNTER — Emergency Department (HOSPITAL_BASED_OUTPATIENT_CLINIC_OR_DEPARTMENT_OTHER): Payer: Medicare Other | Admitting: Radiology

## 2023-06-25 ENCOUNTER — Encounter (HOSPITAL_BASED_OUTPATIENT_CLINIC_OR_DEPARTMENT_OTHER): Payer: Self-pay

## 2023-06-25 ENCOUNTER — Emergency Department (HOSPITAL_BASED_OUTPATIENT_CLINIC_OR_DEPARTMENT_OTHER): Payer: Medicare Other

## 2023-06-25 DIAGNOSIS — N281 Cyst of kidney, acquired: Secondary | ICD-10-CM | POA: Diagnosis not present

## 2023-06-25 DIAGNOSIS — R42 Dizziness and giddiness: Secondary | ICD-10-CM | POA: Insufficient documentation

## 2023-06-25 DIAGNOSIS — Z7982 Long term (current) use of aspirin: Secondary | ICD-10-CM | POA: Diagnosis not present

## 2023-06-25 DIAGNOSIS — Z79899 Other long term (current) drug therapy: Secondary | ICD-10-CM | POA: Diagnosis not present

## 2023-06-25 DIAGNOSIS — R918 Other nonspecific abnormal finding of lung field: Secondary | ICD-10-CM | POA: Diagnosis not present

## 2023-06-25 DIAGNOSIS — K769 Liver disease, unspecified: Secondary | ICD-10-CM | POA: Insufficient documentation

## 2023-06-25 DIAGNOSIS — R001 Bradycardia, unspecified: Secondary | ICD-10-CM | POA: Insufficient documentation

## 2023-06-25 DIAGNOSIS — R222 Localized swelling, mass and lump, trunk: Secondary | ICD-10-CM

## 2023-06-25 DIAGNOSIS — M542 Cervicalgia: Secondary | ICD-10-CM | POA: Diagnosis not present

## 2023-06-25 DIAGNOSIS — I700 Atherosclerosis of aorta: Secondary | ICD-10-CM | POA: Insufficient documentation

## 2023-06-25 DIAGNOSIS — M25512 Pain in left shoulder: Secondary | ICD-10-CM | POA: Diagnosis not present

## 2023-06-25 DIAGNOSIS — R2232 Localized swelling, mass and lump, left upper limb: Secondary | ICD-10-CM | POA: Insufficient documentation

## 2023-06-25 LAB — BASIC METABOLIC PANEL
Anion gap: 11 (ref 5–15)
BUN: 14 mg/dL (ref 8–23)
CO2: 23 mmol/L (ref 22–32)
Calcium: 9.8 mg/dL (ref 8.9–10.3)
Chloride: 105 mmol/L (ref 98–111)
Creatinine, Ser: 1.05 mg/dL — ABNORMAL HIGH (ref 0.44–1.00)
GFR, Estimated: 54 mL/min — ABNORMAL LOW (ref >60)
Glucose, Bld: 133 mg/dL — ABNORMAL HIGH (ref 70–99)
Potassium: 3.4 mmol/L — ABNORMAL LOW (ref 3.5–5.1)
Sodium: 139 mmol/L (ref 135–145)

## 2023-06-25 LAB — CBC
HCT: 41.2 % (ref 36.0–46.0)
Hemoglobin: 13.9 g/dL (ref 12.0–15.0)
MCH: 29.7 pg (ref 26.0–34.0)
MCHC: 33.7 g/dL (ref 30.0–36.0)
MCV: 88 fL (ref 80.0–100.0)
Platelets: 246 10*3/uL (ref 150–400)
RBC: 4.68 MIL/uL (ref 3.87–5.11)
RDW: 13.7 % (ref 11.5–15.5)
WBC: 3.4 10*3/uL — ABNORMAL LOW (ref 4.0–10.5)
nRBC: 0 % (ref 0.0–0.2)

## 2023-06-25 LAB — TROPONIN I (HIGH SENSITIVITY)
Troponin I (High Sensitivity): 4 ng/L (ref <18)
Troponin I (High Sensitivity): 4 ng/L (ref <18)

## 2023-06-25 MED ORDER — IOHEXOL 300 MG/ML  SOLN
100.0000 mL | Freq: Once | INTRAMUSCULAR | Status: AC | PRN
  Administered 2023-06-25: 80 mL via INTRAVENOUS

## 2023-06-25 MED ORDER — OXYCODONE-ACETAMINOPHEN 5-325 MG PO TABS: 1.0000 | ORAL_TABLET | Freq: Three times a day (TID) | ORAL | 0 refills | Status: AC | PRN

## 2023-06-25 NOTE — ED Triage Notes (Signed)
Patient reports started feeling dizzy today but has also been having left shoulder and neck pain which she thought was arthritis.  Reports she felt her shoulder/neck and felt a lump. +nausea pain down to elbow.  +sob +sweating.

## 2023-06-25 NOTE — Discharge Instructions (Signed)
Follow-up with the cancer center.  They should be calling you for an appointment but you can also call if you do not hear from them.

## 2023-06-25 NOTE — ED Notes (Signed)
Pt provided a warm blanket. Reports minor dizziness at rest, family at beside; call light in reach.

## 2023-06-25 NOTE — ED Provider Notes (Signed)
[place] EMERGENCY DEPARTMENT AT [facility] Provider Note   CSN: [id number] Arrival date & time: 06/25/23  1434     History  Chief Complaint  Patient presents with   Dizziness    Stephanie Newton is a 82 y.o. female.   Dizziness Patient presents with left shoulder pain.  States feels dizzy at times.  States pain has been hurting her left shoulder.  Has had some pain going down the arm.  Some nausea at times.  Did feel little bad all over earlier.  No cough.  No real shortness of breath.  States she does feel little unsteady at times.  States her heart is a little slower and she has felt like this in the past when her heart rate does go slow.  No numbness or weakness.  No trauma.  States she feels a swelling above her left shoulder.     Home Medications Prior to Admission medications   Medication Sig Start Date End Date Taking? Authorizing Provider  oxyCODONE-acetaminophen (PERCOCET/ROXICET) 5-325 MG tablet Take 1-2 tablets by mouth every 8 (eight) hours as needed for severe pain. 06/25/23  Yes Benjiman Core, MD  amLODipine (NORVASC) 2.5 MG tablet Take 2.5 mg by mouth daily. 09/03/20   [provider]  aspirin EC 81 MG tablet Take 1 tablet by mouth daily.    [provider]  estradiol (ESTRACE) 1 MG tablet Take 1 mg by mouth daily.    [provider]  famotidine (PEPCID) 20 MG tablet Take 20 mg by mouth as needed. 01/26/22   [provider]  hydrochlorothiazide (HYDRODIURIL) 12.5 MG tablet Take 12.5 mg by mouth daily.     [provider]  methocarbamol (ROBAXIN) 500 MG tablet Take 1 tablet (500 mg total) by mouth every 6 (six) hours as needed for muscle spasms. 05/01/20   Edmisten, Kristie L, PA  metoprolol succinate (TOPROL XL) 50 MG 24 hr tablet Take 1 tablet (50 mg total) by mouth daily. 07/20/22   Sharlene Dory, PA-C  metoprolol succinate (TOPROL-XL) 100 MG 24 hr tablet Take 100 mg by mouth daily.    [provider]  simvastatin (ZOCOR) 20 MG tablet Take 20 mg by mouth at bedtime.    [provider]      Allergies    Shellfish allergy    Review of Systems   Review of Systems  Neurological:  Positive for dizziness.    Physical Exam Updated Vital Signs BP 114/80   Pulse (!) 55   Temp 97.6 F (36.4 C) (Oral)   Resp 17   Ht 5\' 5"  (1.651 m)   Wt 76.7 kg   SpO2 96%   BMI 28.12 kg/m  Physical Exam Vitals reviewed.  HENT:     Head: Atraumatic.  Eyes:     Extraocular Movements: Extraocular movements intact.  Cardiovascular:     Rate and Rhythm: Regular rhythm. Bradycardia present.  Chest:     Chest wall: No tenderness.  Abdominal:     Tenderness: There is no abdominal tenderness.  Musculoskeletal:     Cervical back: Neck supple.     Comments: Left supraclavicular area there is a firm mass.  Approximately 3 cm across.  No erythema.  Good range of motion in left shoulder.  Neurovascular intact distally.  Neurological:     Mental Status: She is alert and oriented to person, place, and time.     Comments: Eye movement intact.  Finger-nose intact bilaterally.  Awake and appropriate.  ED Results / Procedures / Treatments   Labs (all labs ordered are listed, but only abnormal results are displayed) Labs Reviewed  BASIC METABOLIC PANEL - Abnormal; Notable for the following components:      Result Value   Potassium 3.4 (*)    Glucose, Bld 133 (*)    Creatinine, Ser 1.05 (*)    GFR, Estimated 54 (*)    All other components within normal limits  CBC - Abnormal; Notable for the following components:   WBC 3.4 (*)    All other components within normal limits  TROPONIN I (HIGH SENSITIVITY)  TROPONIN I (HIGH SENSITIVITY)    EKG EKG Interpretation Date/Time:  Saturday June 25 2023 14:43:20 EDT Ventricular Rate:  59 PR Interval:  174 QRS Duration:  78 QT Interval:  420 QTC Calculation: 415 R Axis:   39  Text Interpretation: Sinus bradycardia Possible Inferior infarct ,  age undetermined Cannot rule out Anterior infarct (cited on or before 25-Jun-2023) Abnormal ECG When compared with ECG of 12-Jun-2022 16:55, No significant change since last tracing Confirmed by Benjiman Core [id number]) on 06/25/2023 3:14:30 PM  Radiology CT CHEST ABDOMEN PELVIS W CONTRAST  Result Date: 06/25/2023 CLINICAL DATA:  Dizziness with left shoulder and neck pain. * Tracking Code: BO * EXAM: CT CHEST, ABDOMEN, AND PELVIS WITH CONTRAST TECHNIQUE: Multidetector CT imaging of the chest, abdomen and pelvis was performed following the standard protocol during bolus administration of intravenous contrast. RADIATION DOSE REDUCTION: This exam was performed according to the departmental dose-optimization program which includes automated exposure control, adjustment of the mA and/or kV according to patient size and/or use of iterative reconstruction technique. CONTRAST:  80mL OMNIPAQUE IOHEXOL 300 MG/ML  SOLN COMPARISON:  Chest radiograph same date. Chest CT 07/25/2021 (without contrast) and chest CTA 12/03/2018. Cardiac CT 10/26/2022 FINDINGS: CT CHEST FINDINGS Cardiovascular: No significant vascular findings. There is no evidence of acute pulmonary embolism. The heart size is normal. No significant pericardial effusion. Mediastinum/Nodes: There is an incompletely visualized left supraclavicular mass with central low density, suspicious for a necrotic lymph node. This measures up to 3.0 x 2.4 cm on image 2/2. No other enlarged mediastinal, hilar, axillary or internal mammary lymph nodes are identified. The thyroid gland, trachea and esophagus demonstrate no significant findings. Lungs/Pleura: No pleural effusion or pneumothorax. Further slight enlargement in previously demonstrated solid left lower lobe pulmonary nodules, measuring 1.6 x 1.0 cm on image 37/4 and 1.2 x 1.1 cm on image 55/4. 6 mm solid left lower lobe nodule on image 100/4 is unchanged. The part solid nodule medially in the right upper lobe  measuring approximately 1.3 cm on image 31/4 has not significantly changed from the most recent prior study. No other suspicious pulmonary nodules. Musculoskeletal/Chest wall: No evidence of acute fracture or osseous metastatic disease. Stable benign lipoma within the right subscapularis muscle. CT ABDOMEN AND PELVIS FINDINGS Hepatobiliary: There are indeterminate lesions centrally within the right hepatic lobe, measuring 1.4 cm on image 43/2 and 1.2 cm on image 46/2. These demonstrate peripheral enhancement on the early images, and are less well seen on the later images, possibly reflecting hemangiomas (as postulated on cardiac CT 10/26/2022). Probable cyst in the left hepatic lobe on image 49/2 is unchanged from the previous chest CT. Status post cholecystectomy without significant biliary dilatation. Pancreas: Unremarkable. No pancreatic ductal dilatation or surrounding inflammatory changes. Spleen: Normal in size without focal abnormality. Adrenals/Urinary Tract: 1.5 cm nodule anterior to the left kidney is contiguous with the lateral limb of the  left adrenal gland (image 53/2) this has a nonspecific density of 70 HU, although appears unchanged in size from previous noncontrast CT of 07/25/2021. This measured water density on that examination, and this likely represents an incidental adenoma. The right adrenal gland appears normal. No evidence of urinary tract calculus, suspicious renal lesion or hydronephrosis. Simple 4.3 cm cyst in the upper pole of the right kidney for which no specific follow-up imaging is recommended. There is mild scarring in the interpolar region of the right kidney. The bladder appears unremarkable for its degree of distention. Stomach/Bowel: No enteric contrast administered. The stomach appears unremarkable for its degree of distension. No evidence of bowel wall thickening, distention or surrounding inflammatory change. There are bowel anastomosis clips anteriorly in the mid abdomen.  Vascular/Lymphatic: There are no enlarged abdominal or pelvic lymph nodes. Mild aortic and branch vessel atherosclerosis without evidence of aneurysm or large vessel occlusion. Reproductive: Status post hysterectomy. No evidence of adnexal mass. Other: Postsurgical changes in the anterior abdominal wall. No ascites, peritoneal nodularity or pneumoperitoneum. Musculoskeletal: No acute or significant osseous findings. Lower lumbar spondylosis with asymmetric right hip arthropathy. Unless specific follow-up recommendations are mentioned in the findings or impression sections, no imaging follow-up of any mentioned incidental findings is recommended. IMPRESSION: 1. Partially visualized left supraclavicular mass with central low density, suspicious for a necrotic lymph node. This is worrisome for metastatic disease. 2. Further slight enlargement of previously demonstrated solid left lower lobe pulmonary nodules, suspicious for metastatic disease or primary bronchogenic carcinoma (likely adenocarcinoma). The part solid nodule medially in the right upper lobe has not significantly changed. 3. No other definite evidence of metastatic disease in the chest, abdomen or pelvis. 4. Indeterminate lesions centrally within the right hepatic lobe, possibly hemangiomas. 5. Further evaluation recommended with PET-CT. If PET-CT is not performed, CT of the neck should be considered to more fully visualize the left neck pathology which is incompletely visualized by this examination. 6.  Aortic Atherosclerosis (ICD10-I70.0). Electronically Signed   By: Carey Bullocks M.D.   On: 06/25/2023 17:38   DG Chest 2 View  Result Date: 06/25/2023 CLINICAL DATA:  Left shoulder and neck pain EXAM: CHEST - 2 VIEW COMPARISON:  None Available. FINDINGS: The heart size and mediastinal contours are within normal limits. Both lungs are clear. The visualized skeletal structures are unremarkable. IMPRESSION: No active cardiopulmonary disease.  Electronically Signed   By: Darliss Cheney M.D.   On: 06/25/2023 15:47    Procedures Procedures    Medications Ordered in ED Medications  iohexol (OMNIPAQUE) 300 MG/ML solution 100 mL (80 mLs Intravenous Contrast Given 06/25/23 1633)    ED Course/ Medical Decision Making/ A&P                             Medical Decision Making Amount and/or Complexity of Data Reviewed Labs: ordered. Radiology: ordered.  Risk Prescription drug management.   Patient with dizziness left shoulder pain and left supraclavicular mass.  Differential diagnosis includes swollen lymph nodes, lymphadenopathy, metastatic cancer disease.  Chest x-ray reassuring.  Has reassuring blood work overall.  Chronically mildly low white count.  Chest xray did not show mass but will get CT chest abdomen pelvis to evaluate for malignancy.  Nonfocal exam in terms of dizziness and states it was like this with her bradycardia in the past.  Only mild bradycardia.  Patient is on metoprolol.  CT chest does show potential malignancy.  Also potential primary  from the lungs with metastatic node in the supraclavicular area.  Discussed with radiologist and does not think we need CT of the neck at this time.  Discussed with Dr. Ave Filter who will help arrange follow-up.  Will be seen this week in the possible malignancy clinic.  Discussed with patient the findings including possible the likely cancer.         Final Clinical Impression(s) / ED Diagnoses Final diagnoses:  Supraclavicular mass    Rx / DC Orders ED Discharge Orders          Ordered    Ambulatory referral to Hematology / Oncology       Comments: Your emergency department provider has referred you to see a hematology/oncology specialist. These are physicians who specialize in blood disorders and cancers, or findings concerning for cancer. You will receive a phone call from the [facility] Office to set up your appointment within 2 business days: [employer]  operate Mon - Fri, 8:00 a.m. to 5:00 p.m.; closed for federally recognized holidays. Please be sure your phone is not set to block numbers during this time.   06/25/23 1828    oxyCODONE-acetaminophen (PERCOCET/ROXICET) 5-325 MG tablet  Every 8 hours PRN        06/25/23 Irene Shipper, MD 06/25/23 2346

## 2023-06-25 NOTE — ED Notes (Signed)
Pt received AVS; Reiterated d/c instructions and follow up. Pt verbalized understanding and had now further questions upon discharge.

## 2023-06-27 DIAGNOSIS — Z961 Presence of intraocular lens: Secondary | ICD-10-CM | POA: Diagnosis not present

## 2023-06-28 NOTE — Progress Notes (Unsigned)
[facility] [phone]   Fax:([phone]) [phone]  INITIAL CONSULTATION:  Patient Care Team: Emilio Aspen, MD as PCP - General (Internal Medicine) Corky Crafts, MD as PCP - Cardiology (Cardiology)  CHIEF COMPLAINTS/PURPOSE OF CONSULTATION:  "Supraclavicular mass "  HISTORY OF PRESENTING ILLNESS:  Stephanie Newton 82 y.o. female with medical history significant for NSTEMI, coronary artery aneurysm, hypertension, osteoarthritis, hyperlipidemia, nicotine abuse.  On review of the previous records patient presented to ED on 06/25/2023 for dizziness, also reported left shoulder pain.  CT chest abdomen pelvis performed and and shows partially visualized left supraclavicular mass with central low-density, suspicious for necrotic lymph node and worrisome for metastatic disease.  Also showing further slight enlargement of previously demonstrated solid left lower lobe pulmonary nodules which is also suspicious for metastatic disease or primary bronchogenic carcinoma.   On exam today patient reports her initial symptoms prompting her ED visit were dizziness and left-sided neck swelling.  She describes her dizziness as feeling swimmy headed.  It lasted several days before resolving.  She did not have any falls, gait abnormalities or visual changes.  Patient noticed swelling to the left side of her neck x 4 days ago as well that she describes as sore with deep palpation.  She describes the pain as throbbing.  It radiates to her elbow.  She tried taking Aleve without any symptom improvement.  At its worst pain was 8 out of 10 in severity.  Patient states the pain has improved now is noticeable with certain positions.  Her left arm will sometimes tingle.  She is right-hand dominant.  Patient does report a longstanding history of night sweats over 20 years which she has always chalked up to menopause.  No significant change in night sweats.  She  denies any fever, weight loss, shortness of breath, wheezing, hemoptysis.  On further discussion patient reports she has declined colonoscopy screenings.  Her last mammogram was in 2024 and did not show evidence of malignancy.  She has never had a lung cancer screening CT.  She reports her Pap smears have all been normal in the past.  Patient quit smoking x 5 years ago, had 30-year history smoking 5 to 6 cigarettes/day.  MEDICAL HISTORY:  Past Medical History:  Diagnosis Date   Hypercholesteremia    Hypertension     SURGICAL HISTORY: Past Surgical History:  Procedure Laterality Date   BREAST EXCISIONAL BIOPSY Right    x2   EYE SURGERY     cataract bilateral    HERNIA REPAIR  06/2000   INCISIONAL VENTRAL ; DR Carman Ching    KNEE ARTHROSCOPY Left 08/23/2017   dr Thomasena Edis    LAPAROSCOPIC CHOLECYSTECTOMY  06/13/2000   DR DOUG [facility]    LEFT HEART CATH AND CORONARY ANGIOGRAPHY N/A 12/04/2018   Procedure: LEFT HEART CATH AND CORONARY ANGIOGRAPHY;  Surgeon: Lyn Records, MD;  Location: [facility] INV[facility];  Service: Cardiovascular;  Laterality: N/A;   TOTAL KNEE ARTHROPLASTY Left 03/31/2018   Procedure: LEFT TOTAL KNEE ARTHROPLASTY;  Surgeon: Eugenia Mcalpine, MD;  Location: [facility] ORS;  Service: Orthopedics;  Laterality: Left;  with block   TOTAL KNEE ARTHROPLASTY Right 04/30/2020   Procedure: RIGHT TOTAL KNEE ARTHROPLASTY;  Surgeon: Ollen Gross, MD;  Location: [facility] ORS;  Service: Orthopedics;  Laterality: Right;    VAGINAL HYSTERECTOMY  age 80s    SOCIAL HISTORY: Social History   Socioeconomic History   Marital status: Married    Spouse  name: Not on file   Number of children: Not on file   Years of education: Not on file   Highest education level: Not on file  Occupational History   Not on file  Tobacco Use   Smoking status: Former    Types: Cigarettes   Smokeless tobacco: Never   Tobacco comments:    6 cigarettes a day  Vaping Use   Vaping status: Never Used   Substance and Sexual Activity   Alcohol use: Yes    Comment: occasionally    Drug use: No   Sexual activity: Yes  Other Topics Concern   Not on file  Social History Narrative   Not on file   Social Determinants of Health   Financial Resource Strain: Unknown (12/04/2018)   Overall Financial Resource Strain (CARDIA)    Difficulty of Paying Living Expenses: Patient declined  Food Insecurity: Unknown (12/04/2018)   Hunger Vital Sign    Worried About Running Out of Food in the Last Year: Patient declined    Ran Out of Food in the Last Year: Patient declined  Transportation Needs: Unknown (12/04/2018)   PRAPARE - Administrator, Civil Service (Medical): Patient declined    Lack of Transportation (Non-Medical): Patient declined  Physical Activity: Unknown (12/04/2018)   Exercise Vital Sign    Days of Exercise per Week: Patient declined    Minutes of Exercise per Session: Patient declined  Stress: Unknown (12/04/2018)   [employer] of Occupational Health - Occupational Stress Questionnaire    Feeling of Stress : Patient declined  Social Connections: Unknown (12/04/2018)   Social Connection and Isolation Panel [NHANES]    Frequency of Communication with Friends and Family: Patient declined    Frequency of Social Gatherings with Friends and Family: Patient declined    Attends Religious Services: Patient declined    Database administrator or Organizations: Patient declined    Attends Banker Meetings: Patient declined    Marital Status: Patient declined  Intimate Partner Violence: Unknown (12/04/2018)   Humiliation, Afraid, Rape, and Kick questionnaire    Fear of Current or Ex-Partner: Patient declined    Emotionally Abused: Patient declined    Physically Abused: Patient declined    Sexually Abused: Patient declined    FAMILY HISTORY: Family History  Problem Relation Age of Onset   Heart attack Mother    Pneumonia Brother     ALLERGIES:  is  allergic to shellfish allergy.  MEDICATIONS:  Current Outpatient Medications  Medication Sig Dispense Refill   amLODipine (NORVASC) 2.5 MG tablet Take 2.5 mg by mouth daily.     aspirin EC 81 MG tablet Take 1 tablet by mouth daily.     estradiol (ESTRACE) 1 MG tablet Take 1 mg by mouth daily.     famotidine (PEPCID) 20 MG tablet Take 20 mg by mouth as needed.     hydrochlorothiazide (HYDRODIURIL) 12.5 MG tablet Take 12.5 mg by mouth daily.      methocarbamol (ROBAXIN) 500 MG tablet Take 1 tablet (500 mg total) by mouth every 6 (six) hours as needed for muscle spasms. 40 tablet 0   metoprolol succinate (TOPROL XL) 50 MG 24 hr tablet Take 1 tablet (50 mg total) by mouth daily. 90 tablet 3   metoprolol succinate (TOPROL-XL) 100 MG 24 hr tablet Take 100 mg by mouth daily.     oxyCODONE-acetaminophen (PERCOCET/ROXICET) 5-325 MG tablet Take 1-2 tablets by mouth every 8 (eight) hours as needed for severe pain.  6 tablet 0   simvastatin (ZOCOR) 20 MG tablet Take 20 mg by mouth at bedtime.     No current facility-administered medications for this visit.    REVIEW OF SYSTEMS:   All other systems are reviewed and are negative for acute change except as noted in the HPI.  PHYSICAL EXAMINATION: ECOG PERFORMANCE STATUS: 1 - Symptomatic but completely ambulatory  Vitals:   06/29/23 0904  BP: 138/77  Pulse: 64  Resp: 16  Temp: 98.2 F (36.8 C)  SpO2: 98%   Filed Weights   06/29/23 0904  Weight: 170 lb 12.8 oz (77.5 kg)    Physical Exam Vitals reviewed.  Constitutional:      Appearance: She is not ill-appearing or toxic-appearing.  HENT:     Head: Normocephalic.     Right Ear: External ear normal.     Left Ear: External ear normal.     Nose: Nose normal.  Eyes:     Conjunctiva/sclera: Conjunctivae normal.  Cardiovascular:     Rate and Rhythm: Normal rate.     Pulses: Normal pulses.     Heart sounds: Normal heart sounds.  Pulmonary:     Effort: Pulmonary effort is normal.   Abdominal:     General: There is no distension.  Musculoskeletal:        General: Normal range of motion.     Cervical back: Normal range of motion.     Right lower leg: No edema.     Left lower leg: No edema.  Lymphadenopathy:     Head:     Left side of head: No submandibular, preauricular or posterior auricular adenopathy.     Cervical:     Left cervical: No superficial, deep or posterior cervical adenopathy.     Upper Body:     Left upper body: Supraclavicular adenopathy present. No axillary, pectoral or epitrochlear adenopathy.  Skin:    General: Skin is warm and dry.     Capillary Refill: Capillary refill takes less than 2 seconds.  Neurological:     Mental Status: She is alert and oriented to person, place, and time.       LABORATORY DATA:  I have reviewed the data as listed    Latest Ref Rng & Units 06/29/2023   10:08 AM 06/25/2023    2:48 PM 06/12/2022    9:17 PM  CBC  WBC 4.0 - 10.5 K/uL 5.1  3.4  5.2   Hemoglobin 12.0 - 15.0 g/dL 16.1  09.6  04.5   Hematocrit 36.0 - 46.0 % 41.5  41.2  43.5   Platelets 150 - 400 K/uL 250  246  268        Latest Ref Rng & Units 06/29/2023   10:08 AM 06/25/2023    2:48 PM 10/19/2022   10:30 AM  CMP  Glucose 70 - 99 mg/dL 409  811  90   BUN 8 - 23 mg/dL 9  14  13    Creatinine 0.44 - 1.00 mg/dL 9.14  7.82  9.56   Sodium 135 - 145 mmol/L 139  139  139   Potassium 3.5 - 5.1 mmol/L 3.5  3.4  4.0   Chloride 98 - 111 mmol/L 104  105  102   CO2 22 - 32 mmol/L 28  23  24    Calcium 8.9 - 10.3 mg/dL 21.3  9.8  08.6   Total Protein 6.5 - 8.1 g/dL 6.7     Total Bilirubin 0.3 - 1.2 mg/dL 0.5  Alkaline Phos 38 - 126 U/L 77     AST 15 - 41 U/L 25     ALT 0 - 44 U/L 14        RADIOGRAPHIC STUDIES: I have personally reviewed the radiological images as listed and agreed with the findings in the report. CT CHEST ABDOMEN PELVIS W CONTRAST  Result Date: 06/25/2023 CLINICAL DATA:  Dizziness with left shoulder and neck pain. * Tracking  Code: BO * EXAM: CT CHEST, ABDOMEN, AND PELVIS WITH CONTRAST TECHNIQUE: Multidetector CT imaging of the chest, abdomen and pelvis was performed following the standard protocol during bolus administration of intravenous contrast. RADIATION DOSE REDUCTION: This exam was performed according to the departmental dose-optimization program which includes automated exposure control, adjustment of the mA and/or kV according to patient size and/or use of iterative reconstruction technique. CONTRAST:  80mL OMNIPAQUE IOHEXOL 300 MG/ML  SOLN COMPARISON:  Chest radiograph same date. Chest CT 07/25/2021 (without contrast) and chest CTA 12/03/2018. Cardiac CT 10/26/2022 FINDINGS: CT CHEST FINDINGS Cardiovascular: No significant vascular findings. There is no evidence of acute pulmonary embolism. The heart size is normal. No significant pericardial effusion. Mediastinum/Nodes: There is an incompletely visualized left supraclavicular mass with central low density, suspicious for a necrotic lymph node. This measures up to 3.0 x 2.4 cm on image 2/2. No other enlarged mediastinal, hilar, axillary or internal mammary lymph nodes are identified. The thyroid gland, trachea and esophagus demonstrate no significant findings. Lungs/Pleura: No pleural effusion or pneumothorax. Further slight enlargement in previously demonstrated solid left lower lobe pulmonary nodules, measuring 1.6 x 1.0 cm on image 37/4 and 1.2 x 1.1 cm on image 55/4. 6 mm solid left lower lobe nodule on image 100/4 is unchanged. The part solid nodule medially in the right upper lobe measuring approximately 1.3 cm on image 31/4 has not significantly changed from the most recent prior study. No other suspicious pulmonary nodules. Musculoskeletal/Chest wall: No evidence of acute fracture or osseous metastatic disease. Stable benign lipoma within the right subscapularis muscle. CT ABDOMEN AND PELVIS FINDINGS Hepatobiliary: There are indeterminate lesions centrally within the  right hepatic lobe, measuring 1.4 cm on image 43/2 and 1.2 cm on image 46/2. These demonstrate peripheral enhancement on the early images, and are less well seen on the later images, possibly reflecting hemangiomas (as postulated on cardiac CT 10/26/2022). Probable cyst in the left hepatic lobe on image 49/2 is unchanged from the previous chest CT. Status post cholecystectomy without significant biliary dilatation. Pancreas: Unremarkable. No pancreatic ductal dilatation or surrounding inflammatory changes. Spleen: Normal in size without focal abnormality. Adrenals/Urinary Tract: 1.5 cm nodule anterior to the left kidney is contiguous with the lateral limb of the left adrenal gland (image 53/2) this has a nonspecific density of 70 HU, although appears unchanged in size from previous noncontrast CT of 07/25/2021. This measured water density on that examination, and this likely represents an incidental adenoma. The right adrenal gland appears normal. No evidence of urinary tract calculus, suspicious renal lesion or hydronephrosis. Simple 4.3 cm cyst in the upper pole of the right kidney for which no specific follow-up imaging is recommended. There is mild scarring in the interpolar region of the right kidney. The bladder appears unremarkable for its degree of distention. Stomach/Bowel: No enteric contrast administered. The stomach appears unremarkable for its degree of distension. No evidence of bowel wall thickening, distention or surrounding inflammatory change. There are bowel anastomosis clips anteriorly in the mid abdomen. Vascular/Lymphatic: There are no enlarged abdominal or pelvic lymph  nodes. Mild aortic and branch vessel atherosclerosis without evidence of aneurysm or large vessel occlusion. Reproductive: Status post hysterectomy. No evidence of adnexal mass. Other: Postsurgical changes in the anterior abdominal wall. No ascites, peritoneal nodularity or pneumoperitoneum. Musculoskeletal: No acute or  significant osseous findings. Lower lumbar spondylosis with asymmetric right hip arthropathy. Unless specific follow-up recommendations are mentioned in the findings or impression sections, no imaging follow-up of any mentioned incidental findings is recommended. IMPRESSION: 1. Partially visualized left supraclavicular mass with central low density, suspicious for a necrotic lymph node. This is worrisome for metastatic disease. 2. Further slight enlargement of previously demonstrated solid left lower lobe pulmonary nodules, suspicious for metastatic disease or primary bronchogenic carcinoma (likely adenocarcinoma). The part solid nodule medially in the right upper lobe has not significantly changed. 3. No other definite evidence of metastatic disease in the chest, abdomen or pelvis. 4. Indeterminate lesions centrally within the right hepatic lobe, possibly hemangiomas. 5. Further evaluation recommended with PET-CT. If PET-CT is not performed, CT of the neck should be considered to more fully visualize the left neck pathology which is incompletely visualized by this examination. 6.  Aortic Atherosclerosis (ICD10-I70.0). Electronically Signed   By: Carey Bullocks M.D.   On: 06/25/2023 17:38   DG Chest 2 View  Result Date: 06/25/2023 CLINICAL DATA:  Left shoulder and neck pain EXAM: CHEST - 2 VIEW COMPARISON:  None Available. FINDINGS: The heart size and mediastinal contours are within normal limits. Both lungs are clear. The visualized skeletal structures are unremarkable. IMPRESSION: No active cardiopulmonary disease. Electronically Signed   By: Darliss Cheney M.D.   On: 06/25/2023 15:47    ASSESSMENT & PLAN FRANCEE SETZER is a 82 y.o. female [facility] consultation regarding supraclavicular mass and enlarging left lung nodule.  We have reviewed etiologies including infectious process, inflammatory process, metastatic disease. Patient will proceed with laboratory workup  today   #Supraclavicular mass and enlarging left lung nodule - CBC, CMP collected today - PET scan and brain MRI have been ordered to evaluate for metastatic disease.  Will plan for biopsy of supraclavicular mass pending PET scan results. - Patient will RTC when work up is complete.  Patient expressed understanding of the recommended workup and is agreeable to move forward.   All questions were answered. The patient knows to call the clinic with any problems, questions or concerns.  Shared visit with Dr. Arbutus Ped.   Orders Placed This Encounter  Procedures   NM PET Image Initial (PI) Skull Base To Thigh    Standing Status:   Future    Standing Expiration Date:   06/28/2024    Order Specific Question:   If indicated for the ordered procedure, I authorize the administration of a radiopharmaceutical per Radiology protocol    Answer:   Yes    Order Specific Question:   Preferred imaging location?    Answer:   [facility]   MR Brain W Wo Contrast    Standing Status:   Future    Standing Expiration Date:   06/28/2024    Order Specific Question:   If indicated for the ordered procedure, I authorize the administration of contrast media per Radiology protocol    Answer:   Yes    Order Specific Question:   What is the patient's sedation requirement?    Answer:   No Sedation    Order Specific Question:   Does the patient have a pacemaker or implanted devices?  Answer:   No    Order Specific Question:   Use SRS Protocol?    Answer:   Yes    Order Specific Question:   Preferred imaging location?    Answer:   [facility] (table limit - 550 lbs)   CBC with Differential (Cancer Center Only)    Standing Status:   Future    Number of Occurrences:   1    Standing Expiration Date:   06/28/2024   CMP (Cancer Center only)    Standing Status:   Future    Number of Occurrences:   1    Standing Expiration Date:   06/28/2024     I have spent a total of 60 minutes minutes of face-to-face  and non-face-to-face time, preparing to see the patient, obtaining and/or reviewing separately obtained history, performing a medically appropriate examination, counseling and educating the patient, ordering medications/tests/procedures, referring and communicating with other health care professionals, documenting clinical information in the electronic health record, independently interpreting results and communicating results to the patient, and care coordination.   Namon Cirri PA-C Department of Hematology/Oncology [facility] Phone: [phone]  ADDENDUM: Hematology/Oncology Attending: I had a face-to-face encounter with the patient today.  I reviewed her records, lab, scan and recommended her care plan.  This is a very pleasant 82 years old African-American female with history of coronary artery disease status post NSTEMI, coronary artery aneurysm, hypertension, osteoarthritis, dyslipidemia as well as history of smoking.  The patient presented to the emergency department on 720 complaining of dizziness and left shoulder pain.  During her evaluation she had CT scan of the chest, abdomen and pelvis with contrast and it showed incompletely visualized left supraclavicular mass with central low-density suspicious for necrotic lymph node measuring 3.0 x 2.4 cm.  There was no other enlarged mediastinal, hilar, axillary or internal mammary lymph nodes identified.  The scan also showed further slight enlargement in previously demonstrated solid left lower lobe pulmonary nodules measuring 1.6 x 1.0 and 1.2 x 1.1 cm.  There was also 0.6 cm solid left lower lobe nodule that ENHERTU (Trastuzumab Derutecan) change it.  The part solid nodule medially in the right upper lobe measured 1.3 cm and not significantly changed from the prior studies.  There was also an indeterminate lesion centrally in the right hepatic lobe measuring 1.4 cm. The patient was referred to the  clinic today for evaluation and recommendation regarding her condition.  When seen today she continues to have the swelling in the left side of the neck and tenderness in that area. She has mild shortness of breath and cough. I had a lengthy discussion with the patient and her husband today about her current condition and further investigation to confirm her diagnosis. I recommended for the patient to have a PET scan as well as MRI of the brain to rule out any other metastatic disease. Will also refer the patient to interventional radiology for consideration of ultrasound-guided core biopsy of the left supraclavicular lymph node but this may need to wait until after the PET scan to identify the hypermetabolic area for better biopsy outcome. The patient will come back for follow-up visit in around 3 weeks for evaluation and discussion of her treatment options based on the biopsy result as well as the staging workup. She was advised to call immediately if she has any other concerning symptoms in the interval.

## 2023-06-29 ENCOUNTER — Inpatient Hospital Stay: Payer: Medicare Other | Attending: Physician Assistant | Admitting: Physician Assistant

## 2023-06-29 ENCOUNTER — Other Ambulatory Visit: Payer: Self-pay

## 2023-06-29 ENCOUNTER — Telehealth: Payer: Self-pay

## 2023-06-29 ENCOUNTER — Inpatient Hospital Stay: Payer: Medicare Other

## 2023-06-29 VITALS — BP 138/77 | HR 64 | Temp 98.2°F | Resp 16 | Wt 170.8 lb

## 2023-06-29 DIAGNOSIS — I10 Essential (primary) hypertension: Secondary | ICD-10-CM | POA: Insufficient documentation

## 2023-06-29 DIAGNOSIS — Z9049 Acquired absence of other specified parts of digestive tract: Secondary | ICD-10-CM | POA: Diagnosis not present

## 2023-06-29 DIAGNOSIS — R911 Solitary pulmonary nodule: Secondary | ICD-10-CM | POA: Diagnosis not present

## 2023-06-29 DIAGNOSIS — F17210 Nicotine dependence, cigarettes, uncomplicated: Secondary | ICD-10-CM

## 2023-06-29 DIAGNOSIS — Z8249 Family history of ischemic heart disease and other diseases of the circulatory system: Secondary | ICD-10-CM | POA: Diagnosis not present

## 2023-06-29 DIAGNOSIS — Z90710 Acquired absence of both cervix and uterus: Secondary | ICD-10-CM | POA: Diagnosis not present

## 2023-06-29 DIAGNOSIS — R221 Localized swelling, mass and lump, neck: Secondary | ICD-10-CM | POA: Insufficient documentation

## 2023-06-29 DIAGNOSIS — I700 Atherosclerosis of aorta: Secondary | ICD-10-CM | POA: Insufficient documentation

## 2023-06-29 DIAGNOSIS — M1990 Unspecified osteoarthritis, unspecified site: Secondary | ICD-10-CM

## 2023-06-29 DIAGNOSIS — M25512 Pain in left shoulder: Secondary | ICD-10-CM | POA: Insufficient documentation

## 2023-06-29 DIAGNOSIS — Z825 Family history of asthma and other chronic lower respiratory diseases: Secondary | ICD-10-CM | POA: Diagnosis not present

## 2023-06-29 DIAGNOSIS — Z79899 Other long term (current) drug therapy: Secondary | ICD-10-CM | POA: Diagnosis not present

## 2023-06-29 DIAGNOSIS — R42 Dizziness and giddiness: Secondary | ICD-10-CM | POA: Diagnosis not present

## 2023-06-29 DIAGNOSIS — R61 Generalized hyperhidrosis: Secondary | ICD-10-CM | POA: Insufficient documentation

## 2023-06-29 DIAGNOSIS — I252 Old myocardial infarction: Secondary | ICD-10-CM | POA: Diagnosis not present

## 2023-06-29 DIAGNOSIS — R918 Other nonspecific abnormal finding of lung field: Secondary | ICD-10-CM | POA: Insufficient documentation

## 2023-06-29 DIAGNOSIS — Z87891 Personal history of nicotine dependence: Secondary | ICD-10-CM | POA: Diagnosis not present

## 2023-06-29 DIAGNOSIS — E785 Hyperlipidemia, unspecified: Secondary | ICD-10-CM

## 2023-06-29 DIAGNOSIS — R590 Localized enlarged lymph nodes: Secondary | ICD-10-CM | POA: Diagnosis not present

## 2023-06-29 LAB — CMP (CANCER CENTER ONLY)
ALT: 14 U/L (ref 0–44)
AST: 25 U/L (ref 15–41)
Albumin: 4.1 g/dL (ref 3.5–5.0)
Alkaline Phosphatase: 77 U/L (ref 38–126)
Anion gap: 7 (ref 5–15)
BUN: 9 mg/dL (ref 8–23)
CO2: 28 mmol/L (ref 22–32)
Calcium: 10 mg/dL (ref 8.9–10.3)
Chloride: 104 mmol/L (ref 98–111)
Creatinine: 0.89 mg/dL (ref 0.44–1.00)
GFR, Estimated: >60 mL/min (ref >60)
Glucose, Bld: 110 mg/dL — ABNORMAL HIGH (ref 70–99)
Potassium: 3.5 mmol/L (ref 3.5–5.1)
Sodium: 139 mmol/L (ref 135–145)
Total Bilirubin: 0.5 mg/dL (ref 0.3–1.2)
Total Protein: 6.7 g/dL (ref 6.5–8.1)

## 2023-06-29 LAB — CBC WITH DIFFERENTIAL (CANCER CENTER ONLY)
Abs Immature Granulocytes: 0.01 10*3/uL (ref 0.00–0.07)
Basophils Absolute: 0 10*3/uL (ref 0.0–0.1)
Basophils Relative: 0 %
Eosinophils Absolute: 0.1 10*3/uL (ref 0.0–0.5)
Eosinophils Relative: 1 %
HCT: 41.5 % (ref 36.0–46.0)
Hemoglobin: 14.3 g/dL (ref 12.0–15.0)
Immature Granulocytes: 0 %
Lymphocytes Relative: 26 %
Lymphs Abs: 1.3 10*3/uL (ref 0.7–4.0)
MCH: 30.1 pg (ref 26.0–34.0)
MCHC: 34.5 g/dL (ref 30.0–36.0)
MCV: 87.4 fL (ref 80.0–100.0)
Monocytes Absolute: 0.4 10*3/uL (ref 0.1–1.0)
Monocytes Relative: 9 %
Neutro Abs: 3.2 10*3/uL (ref 1.7–7.7)
Neutrophils Relative %: 64 %
Platelet Count: 250 10*3/uL (ref 150–400)
RBC: 4.75 MIL/uL (ref 3.87–5.11)
RDW: 13.3 % (ref 11.5–15.5)
WBC Count: 5.1 10*3/uL (ref 4.0–10.5)
nRBC: 0 % (ref 0.0–0.2)

## 2023-06-29 NOTE — Patient Instructions (Addendum)
Diagnostic Clinic Office Visit Discharge Information and Instructions  Thank you for choosing  [facility] for your healthcare needs.  Below is a summary of today's discussion, along with our contact information and an outline of what to expect next.  Reason for Visit:  lung nodule and left supraclavicular mass  Proposed Diagnostic Care Plan: Basic labs collected today. PET scan and brain MRI have been ordered. We are working on Clinical biochemist. Once we have that we will schedule the scans for you. If you need to change the dates you can call [phone]. Once we have the results of the PET scan I will order the biopsy of the supraclavicular mass  What to Expect: - Generally, when lab tests are ordered the results can take up to 1 week for results to be available.  At that point, we will contact you to discuss your results with you.  Unless there is a critical result, we will typically wait for all of your lab results to be available before contacting you. - If a biopsy is part of your Care Plan, those results can take on average 7-10 days to result.  Once results are available, we will contact you to discuss your pathology results and any next steps. - If you have additional imaging ordered, such as a CT Scan, MRI, Ultrasound, Bone Scan, or PET scan, your imaging will need to be authorized then scheduled with the earliest available appointment.  You may be asked to travel to another hospital within [facility] who has a sooner availability, please consider doing so if asked. - If you use MyChart, your results will be available to you in the MyChart portal.  Your provider will be in touch with you as soon as all of your results are available to be discussed.  Your Diagnostic Clinic Provider:  Namon Cirri PA-C and Dr. Arbutus Ped  If you or your caregiver have number blocking on your cell phones, please ensure the cancer center's numbers are not blocked.  If you are not a  registered MyChart user, please consider enrolling in MyChart to receive your test results and visit notes.  You can also access your discharge instructions electronically.  MyChart also gives you an electronic means to communicate with your Care Team instead of needing to call in to the cancer center.  We appreciate you trusting [place] with your healthcare and look forward to partnering with you as we work to uncover what your potential diagnosis may be.  Please do not hesitate to reach out at any point with questions or concerns.

## 2023-06-29 NOTE — Telephone Encounter (Signed)
Called patient regarding scheduled MRI/PET scans. Confirmed date, time and location of scans. Patient verbalized understanding to be NPO for 6 hours prior to PET scan. Patient also confirmed that she has no metal in her head and is not claustrophobic.

## 2023-07-06 ENCOUNTER — Ambulatory Visit
Admission: RE | Admit: 2023-07-06 | Discharge: 2023-07-06 | Disposition: A | Discharge status: Home / Self Care | Payer: Medicare Other | Source: Ambulatory Visit | Attending: Physician Assistant | Admitting: Physician Assistant

## 2023-07-06 DIAGNOSIS — R911 Solitary pulmonary nodule: Secondary | ICD-10-CM | POA: Diagnosis not present

## 2023-07-06 DIAGNOSIS — C770 Secondary and unspecified malignant neoplasm of lymph nodes of head, face and neck: Secondary | ICD-10-CM | POA: Diagnosis not present

## 2023-07-06 DIAGNOSIS — R918 Other nonspecific abnormal finding of lung field: Secondary | ICD-10-CM | POA: Diagnosis not present

## 2023-07-06 LAB — GLUCOSE, CAPILLARY: Glucose-Capillary: 101 mg/dL — ABNORMAL HIGH (ref 70–99)

## 2023-07-06 MED ORDER — FLUDEOXYGLUCOSE F - 18 (FDG) INJECTION
8.8000 | Freq: Once | INTRAVENOUS | Status: AC | PRN
  Administered 2023-07-06: 9.33 via INTRAVENOUS

## 2023-07-08 ENCOUNTER — Ambulatory Visit (HOSPITAL_COMMUNITY)
Admission: RE | Admit: 2023-07-08 | Discharge: 2023-07-08 | Disposition: A | Discharge status: Home / Self Care | Payer: Medicare Other | Source: Ambulatory Visit | Attending: Physician Assistant | Admitting: Physician Assistant

## 2023-07-08 DIAGNOSIS — R42 Dizziness and giddiness: Secondary | ICD-10-CM | POA: Insufficient documentation

## 2023-07-08 DIAGNOSIS — R911 Solitary pulmonary nodule: Secondary | ICD-10-CM | POA: Insufficient documentation

## 2023-07-08 DIAGNOSIS — C3490 Malignant neoplasm of unspecified part of unspecified bronchus or lung: Secondary | ICD-10-CM | POA: Diagnosis not present

## 2023-07-08 MED ORDER — GADOBUTROL 1 MMOL/ML IV SOLN
7.0000 mL | Freq: Once | INTRAVENOUS | Status: AC | PRN
  Administered 2023-07-08: 7 mL via INTRAVENOUS

## 2023-07-15 ENCOUNTER — Encounter: Payer: Self-pay | Admitting: *Deleted

## 2023-07-15 NOTE — Progress Notes (Unsigned)
Richarda Overlie, MD  Marylynn Pearson   PROCEDURE / BIOPSY REVIEW Date: 07/15/23  Requested Biopsy site: left supraclavicular Reason for request: needs biopsy Imaging review: PET CT and Chest CT  Decision: Approved Imaging modality to perform: Ultrasound Schedule with: Patient preference (Local vs Mod Sed) Schedule for: Any VIR  Additional comments:   Please contact me with questions, concerns, or if issue pertaining to this request arise.  Arn Medal, MD [facility] Radiology

## 2023-07-20 ENCOUNTER — Ambulatory Visit: Payer: Medicare Other | Admitting: Orthopaedic Surgery

## 2023-07-26 ENCOUNTER — Other Ambulatory Visit: Payer: Self-pay | Admitting: Radiology

## 2023-07-26 DIAGNOSIS — R599 Enlarged lymph nodes, unspecified: Secondary | ICD-10-CM

## 2023-07-26 NOTE — Progress Notes (Signed)
Chief Complaint: Patient was seen in consultation today for US guided left supraclavicular lymph node biopsy  Referring Physician(s): Mohamed,Mohamed  Supervising Physician:Mir,F  Patient Status: [facility] - Out-pt  History of Present Illness: Stephanie Newton is a 82 y.o. female ex smoker  with PMH sig for HLD,HTN ,NSTEMI, coronary artery aneurysm, osteoarthritis who presents now with recent imaging findings of :   Two left lung nodules measuring up to 13 mm, suspicious for primary bronchogenic carcinoma and/or metastases.   2.4 cm left supraclavicular nodal metastasis. Tissue sampling is suggested.   Mild short segment wall thickening involving the splenic flexure of the colon, with associated mild hypermetabolism. This is nonspecific. Consider colonoscopy for further evaluation, as clinically warranted.   15 mm left adrenal adenoma, benign   She is scheduled today for US guided left supraclavicular lymph node bx for further evaluation.   Past Medical History:  Diagnosis Date   Hypercholesteremia    Hypertension     Past Surgical History:  Procedure Laterality Date   BREAST EXCISIONAL BIOPSY Right    x2   EYE SURGERY     cataract bilateral    HERNIA REPAIR  06/2000   INCISIONAL VENTRAL ; DR Carman Ching    KNEE ARTHROSCOPY Left 08/23/2017   dr Thomasena Edis    LAPAROSCOPIC CHOLECYSTECTOMY  06/13/2000   DR D[facility]G [facility]    LEFT HEART CATH AND CORONARY ANGIOGRAPHY N/A 12/04/2018   Procedure: LEFT HEART CATH AND CORONARY ANGIOGRAPHY;  Surgeon: Lyn Records, MD;  Location: [facility]IVE [facility];  Service: Cardiovascular;  Laterality: N/A;   TOTAL KNEE ARTHROPLASTY Left 03/31/2018   Procedure: LEFT TOTAL KNEE ARTHROPLASTY;  Surgeon: Eugenia Mcalpine, MD;  Location: [facility] ORS;  Service: Orthopedics;  Laterality: Left;  with block   TOTAL KNEE ARTHROPLASTY Right 04/30/2020   Procedure: RIGHT TOTAL KNEE ARTHROPLASTY;  Surgeon: Ollen Gross, MD;  Location: [facility] ORS;  Service:  Orthopedics;  Laterality: Right;    VAGINAL HYSTERECTOMY  age 43s    Allergies: Shellfish allergy  Medications: Prior to Admission medications   Medication Sig Start Date End Date Taking? Authorizing Provider  amLODipine (NORVASC) 2.5 MG tablet Take 2.5 mg by mouth daily. 09/03/20   [provider]  aspirin EC 81 MG tablet Take 1 tablet by mouth daily.    [provider]  estradiol (ESTRACE) 1 MG tablet Take 1 mg by mouth daily.    [provider]  famotidine (PEPCID) 20 MG tablet Take 20 mg by mouth as needed. 01/26/22   [provider]  hydrochlorothiazide (HYDRODIURIL) 12.5 MG tablet Take 12.5 mg by mouth daily.     [provider]  methocarbamol (ROBAXIN) 500 MG tablet Take 1 tablet (500 mg total) by mouth every 6 (six) hours as needed for muscle spasms. 05/01/20   Edmisten, Kristie L, PA  metoprolol succinate (TOPROL XL) 50 MG 24 hr tablet Take 1 tablet (50 mg total) by mouth daily. 07/20/22   Sharlene Dory, PA-C  metoprolol succinate (TOPROL-XL) 100 MG 24 hr tablet Take 100 mg by mouth daily.    [provider]  oxyCODONE-acetaminophen (PERCOCET/ROXICET) 5-325 MG tablet Take 1-2 tablets by mouth every 8 (eight) hours as needed for severe pain. 06/25/23   Benjiman Core, MD  simvastatin (ZOCOR) 20 MG tablet Take 20 mg by mouth at bedtime.    [provider]     Family History  Problem Relation Age of Onset   Heart attack Mother    Pneumonia  Brother     Social History   Socioeconomic History   Marital status: Married    Spouse name: Not on file   Number of children: Not on file   Years of education: Not on file   Highest education level: Not on file  Occupational History   Not on file  Tobacco Use   Smoking status: Former    Types: Cigarettes   Smokeless tobacco: Never   Tobacco comments:    6 cigarettes a day  Vaping Use   Vaping status: Never Used  Substance and Sexual Activity   Alcohol use: Yes     Comment: occasionally    Drug use: No   Sexual activity: Yes  Other Topics Concern   Not on file  Social History Narrative   Not on file   Social Determinants of Health   Financial Resource Strain: Unknown (12/04/2018)   Overall Financial Resource Strain (CARDIA)    Difficulty of Paying Living Expenses: Patient declined  Food Insecurity: Unknown (12/04/2018)   Hunger Vital Sign    Worried About Running Out of Food in the Last Year: Patient declined    Ran Out of Food in the Last Year: Patient declined  Transportation Needs: Unknown (12/04/2018)   PRAPARE - Administrator, Civil Service (Medical): Patient declined    Lack of Transportation (Non-Medical): Patient declined  Physical Activity: Unknown (12/04/2018)   Exercise Vital Sign    Days of Exercise per Week: Patient declined    Minutes of Exercise per Session: Patient declined  Stress: Unknown (12/04/2018)   [employer] of Occupational Health - Occupational Stress Questionnaire    Feeling of Stress : Patient declined  Social Connections: Unknown (12/04/2018)   Social Connection and Isolation Panel [NHANES]    Frequency of Communication with Friends and Family: Patient declined    Frequency of Social Gatherings with Friends and Family: Patient declined    Attends Religious Services: Patient declined    Database administrator or Organizations: Patient declined    Attends Banker Meetings: Patient declined    Marital Status: Patient declined   Review of Systems: denies fever,HA,CP,dyspnea, cough, abd/back pain,N/V or bleeding  Vital Signs: Vitals:   07/27/23 1110  BP: (!) 144/74  Pulse: 62  Resp: 12  Temp: 98 F (36.7 C)  SpO2: 98%      Code Status: FULL CODE  Advance Care Plan: no documents on file    Physical Exam: awake/alert; chest- CTA bilat; heart- RRR; abd- soft,+BS,NT; no LE edema; left supraclavicular adenopathy  Imaging: MR Brain W Wo Contrast  Result Date:  07/15/2023 CLINICAL DATA:  Metastatic disease evaluation.  Lung cancer. EXAM: MRI HEAD WITHOUT AND WITH CONTRAST TECHNIQUE: Multiplanar, multiecho pulse sequences of the brain and surrounding structures were obtained without and with intravenous contrast. CONTRAST:  7mL GADAVIST GADOBUTROL 1 MMOL/ML IV SOLN COMPARISON:  None Available. FINDINGS: Brain: Enhancing dural-based lesion extends inferiorly and medially from the left side of the tentorium with minimal mass effect on the left midbrain and pons. The lesion measures 10 x 8 x 14 mm. No other dural-based lesions are present. Postcontrast images demonstrate no pathologic enhancement. No significant white matter lesions are present. Deep brain nuclei are within normal limits. The ventricles are of normal size. No significant extraaxial fluid collection is present. The brainstem and cerebellum are within normal limits. The internal auditory canals are within normal limits. Midline structures are within normal limits. Vascular: Flow is present in the major  intracranial arteries. Skull and upper cervical spine: The craniocervical junction is normal. Upper cervical spine is within normal limits. Marrow signal is unremarkable. Sinuses/Orbits: The paranasal sinuses and mastoid air cells are clear. The globes and orbits are within normal limits. IMPRESSION: 1. 10 x 8 x 14 mm enhancing dural-based lesion extends inferiorly and medially from the left side of the tentorium with minimal mass effect on the left midbrain and pons. This likely represents a meningioma. 2. Otherwise normal MRI appearance of the brain. No evidence for metastatic disease to the brain. Electronically Signed   By: Marin Roberts M.D.   On: 07/15/2023 14:01   NM PET Image Initial (PI) Skull Base To Thigh  Result Date: 07/12/2023 CLINICAL DATA:  Initial treatment strategy for pulmonary nodule. EXAM: NUCLEAR MEDICINE PET SKULL BASE TO THIGH TECHNIQUE: 9.3 mCi F-18 FDG was injected intravenously.  Full-ring PET imaging was performed from the skull base to thigh after the radiotracer. CT data was obtained and used for attenuation correction and anatomic localization. Fasting blood glucose: 101 mg/dl COMPARISON:  CT chest abdomen pelvis dated 06/25/2023 FINDINGS: Mediastinal blood pool activity: SUV max 2.9 Liver activity: SUV max NA NECK: No hypermetabolic cervical lymphadenopathy. Mild bilateral carotid hypermetabolism, measuring up to 6.9 on the left, although favored to be related to vascular uptake when correlating with CT. Incidental CT findings: None. CHEST: 2.4 cm left supraclavicular node (series 4/image 33), with central necrosis, max SUV 4.6. 13 mm subpleural nodule in the posterior left upper lobe (series 4/image 45), max SUV 5.8. 9 mm subpleural nodule in the posterior left lower lobe (series 4/image 50), max SUV 5.0. Incidental CT findings: None. ABDOMEN/PELVIS: Heterogeneous low-density in the central right liver, favoring geographic hepatic steatosis, without hypermetabolism on PET. Additional 16 mm left hepatic cyst (series 4/image 33). No abnormal hypermetabolism in the spleen, pancreas, or adrenal glands. 15 mm left adrenal nodule just anterior to the left kidney (series 4/image 81), measuring less than 0 HUs on unenhanced CT, compatible with a benign adrenal adenoma. Very mild irregular short-segment wall thickening at the splenic flexure (series 4/image 31), with mild hypermetabolism, max SUV 4.3. No hypermetabolic abdominopelvic lymphadenopathy. Incidental CT findings: Status post cholecystectomy. 3.9 cm simple right upper pole renal cyst (series 4/image 37), benign. No follow-up is recommended. Atherosclerotic calcifications the abdominal aorta and branch vessels. Prior small bowel resection with suture line in the anterior lower abdomen (series 4/image 115). Left colonic diverticulosis, without evidence of diverticulitis. SKELETON: No focal hypermetabolic activity to suggest skeletal  metastasis. Incidental CT findings: Degenerative changes of the visualized thoracolumbar spine. IMPRESSION: Two left lung nodules measuring up to 13 mm, suspicious for primary bronchogenic carcinoma and/or metastases. 2.4 cm left supraclavicular nodal metastasis. Tissue sampling is suggested. Mild short segment wall thickening involving the splenic flexure of the colon, with associated mild hypermetabolism. This is nonspecific. Consider colonoscopy for further evaluation, as clinically warranted. 15 mm left adrenal adenoma, benign. Electronically Signed   By: Charline Bills M.D.   On: 07/12/2023 23:43    Labs:  CBC: Recent Labs    06/25/23 1448 06/29/23 1008  WBC 3.4* 5.1  HGB 13.9 14.3  HCT 41.2 41.5  PLT 246 250    COAGS: No results for input(s): "INR", "APTT" in the last 8760 hours.  BMP: Recent Labs    10/19/22 1030 06/25/23 1448 06/29/23 1008  NA 139 139 139  K 4.0 3.4* 3.5  CL 102 105 104  CO2 24 23 28   GLUCOSE 90 133* 110*  BUN 13 14 9   CALCIUM 10.1 9.8 10.0  CREATININE 0.94 1.05* 0.89  GFRNONAA  --  54* >60    LIVER FUNCTION TESTS: Recent Labs    06/29/23 1008  BILITOT 0.5  AST 25  ALT 14  ALKPHOS 77  PROT 6.7  ALBUMIN 4.1    TUMOR MARKERS: No results for input(s): "AFPTM", "CEA", "CA199", "CHROMGRNA" in the last 8760 hours.  Assessment and Plan: 82 y.o. female ex smoker  with PMH sig for HLD,HTN ,NSTEMI, coronary artery aneurysm, osteoarthritis who presents now with recent imaging findings of :   Two left lung nodules measuring up to 13 mm, suspicious for primary bronchogenic carcinoma and/or metastases.   2.4 cm left supraclavicular nodal metastasis. Tissue sampling is suggested.   Mild short segment wall thickening involving the splenic flexure of the colon, with associated mild hypermetabolism. This is nonspecific. Consider colonoscopy for further evaluation, as clinically warranted.   15 mm left adrenal adenoma, benign   She is  scheduled today for US guided left supraclavicular lymph node bx for further evaluation.Risks and benefits of procedure was discussed with the patient  including, but not limited to bleeding, infection, damage to adjacent structures or low yield requiring additional tests.  All of the questions were answered and there is agreement to proceed.  Consent signed and in chart.    Thank you for this interesting consult.  I greatly enjoyed meeting ALIONA SLUITER and look forward to participating in their care.  A copy of this report was sent to the requesting provider on this date.  Electronically Signed: D. Jeananne Rama, PA-C 07/26/2023, 8:35 PM   I spent a total of  20 minutes in face to face in clinical consultation, greater than 50% of which was counseling/coordinating care for US guided left supraclavicular lymph node biopsy

## 2023-07-27 ENCOUNTER — Ambulatory Visit (HOSPITAL_COMMUNITY)
Admission: RE | Admit: 2023-07-27 | Discharge: 2023-07-27 | Disposition: A | Discharge status: Home / Self Care | Payer: Medicare Other | Source: Ambulatory Visit | Attending: Internal Medicine | Admitting: Internal Medicine

## 2023-07-27 ENCOUNTER — Encounter (HOSPITAL_COMMUNITY): Payer: Self-pay

## 2023-07-27 ENCOUNTER — Other Ambulatory Visit: Payer: Self-pay

## 2023-07-27 DIAGNOSIS — M79602 Pain in left arm: Secondary | ICD-10-CM | POA: Insufficient documentation

## 2023-07-27 DIAGNOSIS — R911 Solitary pulmonary nodule: Secondary | ICD-10-CM

## 2023-07-27 DIAGNOSIS — R222 Localized swelling, mass and lump, trunk: Secondary | ICD-10-CM | POA: Insufficient documentation

## 2023-07-27 DIAGNOSIS — R221 Localized swelling, mass and lump, neck: Secondary | ICD-10-CM | POA: Insufficient documentation

## 2023-07-27 DIAGNOSIS — R599 Enlarged lymph nodes, unspecified: Secondary | ICD-10-CM

## 2023-07-27 LAB — BASIC METABOLIC PANEL
Anion gap: 9 (ref 5–15)
BUN: 12 mg/dL (ref 8–23)
CO2: 26 mmol/L (ref 22–32)
Calcium: 9.6 mg/dL (ref 8.9–10.3)
Chloride: 102 mmol/L (ref 98–111)
Creatinine, Ser: 0.78 mg/dL (ref 0.44–1.00)
GFR, Estimated: >60 mL/min (ref >60)
Glucose, Bld: 114 mg/dL — ABNORMAL HIGH (ref 70–99)
Potassium: 3.4 mmol/L — ABNORMAL LOW (ref 3.5–5.1)
Sodium: 137 mmol/L (ref 135–145)

## 2023-07-27 LAB — CBC WITH DIFFERENTIAL/PLATELET
Abs Immature Granulocytes: 0.02 10*3/uL (ref 0.00–0.07)
Basophils Absolute: 0 10*3/uL (ref 0.0–0.1)
Basophils Relative: 1 %
Eosinophils Absolute: 0.1 10*3/uL (ref 0.0–0.5)
Eosinophils Relative: 2 %
HCT: 40.4 % (ref 36.0–46.0)
Hemoglobin: 13.3 g/dL (ref 12.0–15.0)
Immature Granulocytes: 1 %
Lymphocytes Relative: 34 %
Lymphs Abs: 1.2 10*3/uL (ref 0.7–4.0)
MCH: 29.2 pg (ref 26.0–34.0)
MCHC: 32.9 g/dL (ref 30.0–36.0)
MCV: 88.6 fL (ref 80.0–100.0)
Monocytes Absolute: 0.4 10*3/uL (ref 0.1–1.0)
Monocytes Relative: 11 %
Neutro Abs: 1.9 10*3/uL (ref 1.7–7.7)
Neutrophils Relative %: 51 %
Platelets: 214 10*3/uL (ref 150–400)
RBC: 4.56 MIL/uL (ref 3.87–5.11)
RDW: 13.7 % (ref 11.5–15.5)
WBC: 3.6 10*3/uL — ABNORMAL LOW (ref 4.0–10.5)
nRBC: 0 % (ref 0.0–0.2)

## 2023-07-27 LAB — PROTIME-INR
INR: 1 (ref 0.8–1.2)
Prothrombin Time: 13.5 seconds (ref 11.4–15.2)

## 2023-07-27 MED ORDER — MIDAZOLAM HCL 2 MG/2ML IJ SOLN
INTRAMUSCULAR | Status: AC | PRN
  Administered 2023-07-27: 1 mg via INTRAVENOUS

## 2023-07-27 MED ORDER — FENTANYL CITRATE (PF) 100 MCG/2ML IJ SOLN
INTRAMUSCULAR | Status: AC | PRN
  Administered 2023-07-27: 25 ug via INTRAVENOUS
  Administered 2023-07-27: 50 ug via INTRAVENOUS

## 2023-07-27 MED ORDER — OXYCODONE-ACETAMINOPHEN 5-325 MG PO TABS
1.0000 | ORAL_TABLET | ORAL | Status: DC | PRN
  Administered 2023-07-27 (×2): 1 via ORAL
  Filled 2023-07-27 (×2): qty 1

## 2023-07-27 MED ORDER — MIDAZOLAM HCL 2 MG/2ML IJ SOLN
INTRAMUSCULAR | Status: AC
  Filled 2023-07-27: qty 2

## 2023-07-27 MED ORDER — LIDOCAINE HCL 1 % IJ SOLN
INTRAMUSCULAR | Status: AC
  Filled 2023-07-27: qty 20

## 2023-07-27 MED ORDER — FENTANYL CITRATE (PF) 100 MCG/2ML IJ SOLN
INTRAMUSCULAR | Status: AC
  Filled 2023-07-27: qty 2

## 2023-07-27 MED ORDER — SODIUM CHLORIDE 0.9 % IV SOLN: INTRAVENOUS | Status: DC

## 2023-07-27 NOTE — Procedures (Signed)
Interventional Radiology Procedure Note  Procedure: US guided aspiration of left supraclavicular mass  Indication: Left supraclavicular mass  Findings: Please refer to procedural dictation for full description.  Complications: None  EBL: < 10 mL  Acquanetta Belling, MD [phone]

## 2023-07-27 NOTE — Discharge Instructions (Signed)
Please call Interventional Radiology clinic [phone] with any questions or concerns.   You may remove your dressing and shower tomorrow.    Needle Biopsy, Care After These instructions tell you how to care for yourself after your procedure. Your doctor may also give you more specific instructions. Call your doctor if youhave any problems or questions. What can I expect after the procedure? After the procedure, it is common to have: Soreness. Bruising. Mild pain. Follow these instructions at home:  Return to your normal activities as told by your doctor. Ask your doctor what activities are safe for you. Take over-the-counter and prescription medicines only as told by your doctor. Wash your hands with soap and water before you change your bandage (dressing). If you cannot use soap and water, use hand sanitizer. Follow instructions from your doctor about: How to take care of your puncture site. When and how to change your bandage. When to remove your bandage. Check your puncture site every day for signs of infection. Watch for: Redness, swelling, or pain. Fluid or blood. Pus or a bad smell. Warmth. Do not take baths, swim, or use a hot tub until your doctor approves. Ask your doctor if you may take showers. You may only be allowed to take sponge baths. Keep all follow-up visits as told by your doctor. This is important. Contact a doctor if you have: A fever. Redness, swelling, or pain at the puncture site, and it lasts longer than a few days. Fluid, blood, or pus coming from the puncture site. Warmth coming from the puncture site. Get help right away if: You have a lot of bleeding from the puncture site. Summary After the procedure, it is common to have soreness, bruising, or mild pain at the puncture site. Check your puncture site every day for signs of infection, such as redness, swelling, or pain. Get help right away if you have severe bleeding from your puncture  site. This information is not intended to replace advice given to you by your health care provider. Make sure you discuss any questions you have with your healthcare provider. Document Revised: 05/22/2020 Document Reviewed: 05/22/2020 Elsevier Patient Education  2022 Else[employer] Inc[employer]   Moderate Conscious Sedation, Adult, Care After This sheet gives you information about how to care for yourself after your procedure. Your health care provider may also give you more specific instructions. If you have problems or questions, contact your health care provider. What can I expect after the procedure? After the procedure, it is common to have: Sleepiness for several hours. Impaired judgment for several hours. Difficulty with balance. Vomiting if you eat too soon. Follow these instructions at home: For the time period you were told by your health care provider: Rest. Do not participate in activities where you could fall or become injured. Do not drive or use machinery. Do not drink alcohol. Do not take sleeping pills or medicines that cause drowsiness. Do not make important decisions or sign legal documents. Do not take care of children on your own.      Eating and drinking Follow the diet recommended by your health care provider. Drink enough fluid to keep your urine pale yellow. If you vomit: Drink water, juice, or soup when you can drink without vomiting. Make sure you have little or no nausea before eating solid foods.   General instructions Take over-the-counter and prescription medicines only as told by your health care provider. Have a responsible adult stay with you for the time you are told.   It is important to have someone help care for you until you are awake and alert. Do not smoke. Keep all follow-up visits as told by your health care provider. This is important. Contact a health care provider if: You are still sleepy or having trouble with balance after 24 hours. You feel  light-headed. You keep feeling nauseous or you keep vomiting. You develop a rash. You have a fever. You have redness or swelling around the IV site. Get help right away if: You have trouble breathing. You have new-onset confusion at home. Summary After the procedure, it is common to feel sleepy, have impaired judgment, or feel nauseous if you eat too soon. Rest after you get home. Know the things you should not do after the procedure. Follow the diet recommended by your health care provider and drink enough fluid to keep your urine pale yellow. Get help right away if you have trouble breathing or new-onset confusion at home. This information is not intended to replace advice given to you by your health care provider. Make sure you discuss any questions you have with your health care provider. Document Revised: 03/21/2020 Document Reviewed: 10/18/2019 Elsevier Patient Education  2021 Else[employer] Inc.     

## 2023-07-28 LAB — CYTOLOGY - NON PAP

## 2023-07-29 NOTE — Progress Notes (Unsigned)
[facility] OFFICE PROGRESS NOTE  Emilio Aspen, MD [address] [place] [address]  DIAGNOSIS: Supraclavicular lymphadenopathy, pending further workup    PRIOR THERAPY: None  CURRENT THERAPY: None  INTERVAL HISTORY: Stephanie Newton 82 y.o. female returns to the clinic today for follow-up visit accompanied by ***.  In summary, the patient presented to the ER on 06/25/2023 for dizziness and left shoulder pain and imaging showed left supraclavicular mass with central low-density suspicious for necrotic lymph node and solid left lower lobe pulmonary nodules.  She had declined prior colonoscopy screenings and last mammogram was in 2024 which did not show any malignancy.  She had normal past Pap smears in the past.  She was seen in the diagnostic clinic for further workup.  She had a PET scan and brain MRI which showed ***.  She had a ultrasound-guided core biopsy of the left supraclavicular lymph node which did not show any malignancy as they only aspirated the lymph node but this was stopped secondary to pain and they were unable to obtain a core biopsy.  The radiologist mentioned that the findings were suspicious for nerve sheath tumor and recommended surgical consultation the procedure was terminated normal or biopsies were obtained secondary to severe pain in the patient's left arm and hand.  Surgical consultation was recommended.  Was patient referred to colonoscopy?  Any appointments for GI?  She is scheduled to see Dr. Barbaraann Cao due to 10 x 8 x 14 mm dural based lesion likely representing meningioma.  Since last being seen the patient denies any major changes in her health.  Denies any fever, chills, night sweats, or unexplained weight loss.  Chest pain, shortness of breath, cough, or hemoptysis.  Denies any nausea, vomiting, diarrhea, or constipation.  Abdominal playing, blood in the stool?  Pain?  She is here today for evaluation and for more  detailed discussion about the neck steps in her care.    MEDICAL HISTORY: Past Medical History:  Diagnosis Date   Hypercholesteremia    Hypertension     ALLERGIES:  is allergic to shellfish allergy.  MEDICATIONS:  Current Outpatient Medications  Medication Sig Dispense Refill   amLODipine (NORVASC) 2.5 MG tablet Take 2.5 mg by mouth daily.     aspirin EC 81 MG tablet Take 1 tablet by mouth daily.     estradiol (ESTRACE) 1 MG tablet Take 1 mg by mouth daily.     famotidine (PEPCID) 20 MG tablet Take 20 mg by mouth as needed.     hydrochlorothiazide (HYDRODIURIL) 12.5 MG tablet Take 12.5 mg by mouth daily.      methocarbamol (ROBAXIN) 500 MG tablet Take 1 tablet (500 mg total) by mouth every 6 (six) hours as needed for muscle spasms. 40 tablet 0   metoprolol succinate (TOPROL XL) 50 MG 24 hr tablet Take 1 tablet (50 mg total) by mouth daily. 90 tablet 3   metoprolol succinate (TOPROL-XL) 100 MG 24 hr tablet Take 100 mg by mouth daily.     oxyCODONE-acetaminophen (PERCOCET/ROXICET) 5-325 MG tablet Take 1-2 tablets by mouth every 8 (eight) hours as needed for severe pain. 6 tablet 0   simvastatin (ZOCOR) 20 MG tablet Take 20 mg by mouth at bedtime.     No current facility-administered medications for this visit.    SURGICAL HISTORY:  Past Surgical History:  Procedure Laterality Date   BREAST EXCISIONAL BIOPSY Right    x2   EYE SURGERY  cataract bilateral    HERNIA REPAIR  06/2000   INCISIONAL VENTRAL ; DR Carman Ching    KNEE ARTHROSCOPY Left 08/23/2017   dr Thomasena Edis    LAPAROSCOPIC CHOLECYSTECTOMY  06/13/2000   DR DOUG [facility]    LEFT HEART CATH AND CORONARY ANGIOGRAPHY N/A 12/04/2018   Procedure: LEFT HEART CATH AND CORONARY ANGIOGRAPHY;  Surgeon: Lyn Records, MD;  Location: [facility] INVASIVE CV LAB;  Service: Cardiovascular;  Laterality: N/A;   TOTAL KNEE ARTHROPLASTY Left 03/31/2018   Procedure: LEFT TOTAL KNEE ARTHROPLASTY;  Surgeon: Eugenia Mcalpine, MD;  Location: [facility]  ORS;  Service: Orthopedics;  Laterality: Left;  with block   TOTAL KNEE ARTHROPLASTY Right 04/30/2020   Procedure: RIGHT TOTAL KNEE ARTHROPLASTY;  Surgeon: Ollen Gross, MD;  Location: [facility] ORS;  Service: Orthopedics;  Laterality: Right;    VAGINAL HYSTERECTOMY  age 59s    REVIEW OF SYSTEMS:   Review of Systems  Constitutional: Negative for appetite change, chills, fatigue, fever and unexpected weight change.  HENT:   Negative for mouth sores, nosebleeds, sore throat and trouble swallowing.   Eyes: Negative for eye problems and icterus.  Respiratory: Negative for cough, hemoptysis, shortness of breath and wheezing.   Cardiovascular: Negative for chest pain and leg swelling.  Gastrointestinal: Negative for abdominal pain, constipation, diarrhea, nausea and vomiting.  Genitourinary: Negative for bladder incontinence, difficulty urinating, dysuria, frequency and hematuria.   Musculoskeletal: Negative for back pain, gait problem, neck pain and neck stiffness.  Skin: Negative for itching and rash.  Neurological: Negative for dizziness, extremity weakness, gait problem, headaches, light-headedness and seizures.  Hematological: Negative for adenopathy. Does not bruise/bleed easily.  Psychiatric/Behavioral: Negative for confusion, depression and sleep disturbance. The patient is not nervous/anxious.     PHYSICAL EXAMINATION:  There were no vitals taken for this visit.  ECOG PERFORMANCE STATUS: {CHL ONC ECOG [id number]  Physical Exam  Constitutional: Oriented to person, place, and time and well-developed, well-nourished, and in no distress. No distress.  HENT:  Head: Normocephalic and atraumatic.  Mouth/Throat: Oropharynx is clear and moist. No oropharyngeal exudate.  Eyes: Conjunctivae are normal. Right eye exhibits no discharge. Left eye exhibits no discharge. No scleral icterus.  Neck: Normal range of motion. Neck supple.  Cardiovascular: Normal rate, regular rhythm, normal  heart sounds and intact distal pulses.   Pulmonary/Chest: Effort normal and breath sounds normal. No respiratory distress. No wheezes. No rales.  Abdominal: Soft. Bowel sounds are normal. Exhibits no distension and no mass. There is no tenderness.  Musculoskeletal: Normal range of motion. Exhibits no edema.  Lymphadenopathy:    No cervical adenopathy.  Neurological: Alert and oriented to person, place, and time. Exhibits normal muscle tone. Gait normal. Coordination normal.  Skin: Skin is warm and dry. No rash noted. Not diaphoretic. No erythema. No pallor.  Psychiatric: Mood, memory and judgment normal.  Vitals reviewed.  LABORATORY DATA: Lab Results  Component Value Date   WBC 3.6 (L) 07/27/2023   HGB 13.3 07/27/2023   HCT 40.4 07/27/2023   MCV 88.6 07/27/2023   PLT 214 07/27/2023      Chemistry      Component Value Date/Time   NA 137 07/27/2023 1130   NA 139 10/19/2022 1030   K 3.4 (L) 07/27/2023 1130   CL 102 07/27/2023 1130   CO2 26 07/27/2023 1130   BUN 12 07/27/2023 1130   BUN 13 10/19/2022 1030   CREATININE 0.78 07/27/2023 1130   CREATININE 0.89 06/29/2023 1008  Component Value Date/Time   CALCIUM 9.6 07/27/2023 1130   ALKPHOS 77 06/29/2023 1008   AST 25 06/29/2023 1008   ALT 14 06/29/2023 1008   BILITOT 0.5 06/29/2023 1008       RADIOGRAPHIC STUDIES:  [place] CORE BIOPSY (LYMPH NODES)  Result Date: 07/27/2023 INDICATION: 82 year old woman with history of dizziness and left shoulder and neck pain underwent chest abdomen pelvis CT which showed necrotic mass in the left supraclavicular region. Subsequent PET-CT showed centrally necrotic left supraclavicular mass with an SUV of 4.6. FDG avid lung nodules are also seen. She presents to IR today for biopsy/aspiration of left supraclavicular mass. EXAM: Ultrasound-guided aspiration of left supraclavicular mass MEDICATIONS: None. ANESTHESIA/SEDATION: Moderate (conscious) sedation was employed during this procedure. A  total of Versed 1 mg and Fentanyl 75 mcg was administered intravenously by the radiology nurse. Total intra-service moderate Sedation Time: 21 minutes. The patient's level of consciousness and vital signs were monitored continuously by radiology nursing throughout the procedure under my direct supervision. COMPLICATIONS: None immediate. PROCEDURE: Informed written consent was obtained from the patient after a thorough discussion of the procedural risks, benefits and alternatives. All questions were addressed. Maximal Sterile Barrier Technique was utilized including caps, mask, sterile gowns, sterile gloves, sterile drape, hand hygiene and skin antiseptic. A timeout was performed prior to the initiation of the procedure. Patient positioned supine on the procedure table. Ultrasound evaluation of the left neck mass showed it to be predominantly necrotic. The overlying skin was prepped and draped in the usual fashion. Following local lidocaine administration, the fluid component was accessed with a 17 gauge needle. Proximally 2 mL of straw-colored fluid was aspirated. Aspiration of the fluid resulted in severe pain in the patient's left arm and hand. The procedure was therefore terminated. IMPRESSION: Ultrasound-guided aspiration of left supraclavicular mass yielding 2 mL of straw-colored fluid. The fluid was sent for cytology. Procedure was terminated at this point and no core samples were obtained given that aspiration of the fluid caused severe pain in the patient's left arm and hand. These findings are suspicious for a nerve sheath tumor. If repeat biopsy is required, consider surgical consultation. Electronically Signed   By: Acquanetta Belling M.D.   On: 07/27/2023 15:34   MR Brain W Wo Contrast  Result Date: 07/15/2023 CLINICAL DATA:  Metastatic disease evaluation.  Lung cancer. EXAM: MRI HEAD WITHOUT AND WITH CONTRAST TECHNIQUE: Multiplanar, multiecho pulse sequences of the brain and surrounding structures were  obtained without and with intravenous contrast. CONTRAST:  7mL GADAVIST GADOBUTROL 1 MMOL/ML IV SOLN COMPARISON:  None Available. FINDINGS: Brain: Enhancing dural-based lesion extends inferiorly and medially from the left side of the tentorium with minimal mass effect on the left midbrain and pons. The lesion measures 10 x 8 x 14 mm. No other dural-based lesions are present. Postcontrast images demonstrate no pathologic enhancement. No significant white matter lesions are present. Deep brain nuclei are within normal limits. The ventricles are of normal size. No significant extraaxial fluid collection is present. The brainstem and cerebellum are within normal limits. The internal auditory canals are within normal limits. Midline structures are within normal limits. Vascular: Flow is present in the major intracranial arteries. Skull and upper cervical spine: The craniocervical junction is normal. Upper cervical spine is within normal limits. Marrow signal is unremarkable. Sinuses/Orbits: The paranasal sinuses and mastoid air cells are clear. The globes and orbits are within normal limits. IMPRESSION: 1. 10 x 8 x 14 mm enhancing dural-based lesion extends inferiorly and medially  from the left side of the tentorium with minimal mass effect on the left midbrain and pons. This likely represents a meningioma. 2. Otherwise normal MRI appearance of the brain. No evidence for metastatic disease to the brain. Electronically Signed   By: Marin Roberts M.D.   On: 07/15/2023 14:01   NM PET Image Initial (PI) Skull Base To Thigh  Result Date: 07/12/2023 CLINICAL DATA:  Initial treatment strategy for pulmonary nodule. EXAM: NUCLEAR MEDICINE PET SKULL BASE TO THIGH TECHNIQUE: 9.3 mCi F-18 FDG was injected intravenously. Full-ring PET imaging was performed from the skull base to thigh after the radiotracer. CT data was obtained and used for attenuation correction and anatomic localization. Fasting blood glucose: 101 mg/dl  COMPARISON:  CT chest abdomen pelvis dated 06/25/2023 FINDINGS: Mediastinal blood pool activity: SUV max 2.9 Liver activity: SUV max NA NECK: No hypermetabolic cervical lymphadenopathy. Mild bilateral carotid hypermetabolism, measuring up to 6.9 on the left, although favored to be related to vascular uptake when correlating with CT. Incidental CT findings: None. CHEST: 2.4 cm left supraclavicular node (series 4/image 33), with central necrosis, max SUV 4.6. 13 mm subpleural nodule in the posterior left upper lobe (series 4/image 45), max SUV 5.8. 9 mm subpleural nodule in the posterior left lower lobe (series 4/image 50), max SUV 5.0. Incidental CT findings: None. ABDOMEN/PELVIS: Heterogeneous low-density in the central right liver, favoring geographic hepatic steatosis, without hypermetabolism on PET. Additional 16 mm left hepatic cyst (series 4/image 33). No abnormal hypermetabolism in the spleen, pancreas, or adrenal glands. 15 mm left adrenal nodule just anterior to the left kidney (series 4/image 81), measuring less than 0 HUs on unenhanced CT, compatible with a benign adrenal adenoma. Very mild irregular short-segment wall thickening at the splenic flexure (series 4/image 31), with mild hypermetabolism, max SUV 4.3. No hypermetabolic abdominopelvic lymphadenopathy. Incidental CT findings: Status post cholecystectomy. 3.9 cm simple right upper pole renal cyst (series 4/image 37), benign. No follow-up is recommended. Atherosclerotic calcifications the abdominal aorta and branch vessels. Prior small bowel resection with suture line in the anterior lower abdomen (series 4/image 115). Left colonic diverticulosis, without evidence of diverticulitis. SKELETON: No focal hypermetabolic activity to suggest skeletal metastasis. Incidental CT findings: Degenerative changes of the visualized thoracolumbar spine. IMPRESSION: Two left lung nodules measuring up to 13 mm, suspicious for primary bronchogenic carcinoma and/or  metastases. 2.4 cm left supraclavicular nodal metastasis. Tissue sampling is suggested. Mild short segment wall thickening involving the splenic flexure of the colon, with associated mild hypermetabolism. This is nonspecific. Consider colonoscopy for further evaluation, as clinically warranted. 15 mm left adrenal adenoma, benign. Electronically Signed   By: Charline Bills M.D.   On: 07/12/2023 23:43     ASSESSMENT/PLAN:  This is a very pleasant 82 year old African-American female referred to the clinic for workup of left supraclavicular mass and 2 left lung nodules suspicious for bronchogenic carcinoma and/or metastasis.  There is also a short segment of wall thickening involving the splenic flexure of the colon with associated mild hypermetabolism which is nonspecific and colonoscopy was recommended.  The patient underwent ultrasound-guided aspiration in the left supraclavicular mass yielding 2 mL of fluid and cytology was negative.  The procedure was terminated due to no core samples being obtained secondary to severe pain in the patient's left arm and hand.  The findings were noted to be suspicious for nerve sheath tumor???   The patient was seen with Dr. Arbutus Ped today.  Dr. Arbutus Ped had a lengthy discussion with the patient about her  current condition neck steps in her care.  Dr. Arbutus Ped recommends referral to GI for consideration of colonoscopy regarding the nonspecific findings in the colon.  We will also refer the patient to surgery for consideration of supraclavicular lymph node excision for diagnostic purposes.  See the patient back for follow-up visit in ***to review results and discuss the next steps in her care.  For bronchoscopy? ***  The patient was advised to call immediately if she has any concerning symptoms in the interval. The patient voices understanding of current disease status and treatment options and is in agreement with the current care plan. All questions were answered.  The patient knows to call the clinic with any problems, questions or concerns. We can certainly see the patient much sooner if necessary   No orders of the defined types were placed in this encounter.    I spent {CHL ONC TIME VISIT - BMWUX:[id number]} counseling the patient face to face. The total time spent in the appointment was {CHL ONC TIME VISIT - ZDGUY:[id number]}.  Meleane Selinger L Rhylen Shaheen, PA-C 07/29/23

## 2023-08-02 ENCOUNTER — Inpatient Hospital Stay: Payer: Medicare Other | Attending: Physician Assistant | Admitting: Physician Assistant

## 2023-08-02 ENCOUNTER — Inpatient Hospital Stay (HOSPITAL_BASED_OUTPATIENT_CLINIC_OR_DEPARTMENT_OTHER): Payer: Medicare Other | Admitting: Internal Medicine

## 2023-08-02 VITALS — BP 135/76 | HR 69 | Temp 97.6°F | Resp 13 | Wt 172.3 lb

## 2023-08-02 DIAGNOSIS — G9389 Other specified disorders of brain: Secondary | ICD-10-CM

## 2023-08-02 DIAGNOSIS — R29818 Other symptoms and signs involving the nervous system: Secondary | ICD-10-CM | POA: Insufficient documentation

## 2023-08-02 DIAGNOSIS — R918 Other nonspecific abnormal finding of lung field: Secondary | ICD-10-CM | POA: Insufficient documentation

## 2023-08-02 DIAGNOSIS — Z87891 Personal history of nicotine dependence: Secondary | ICD-10-CM | POA: Diagnosis not present

## 2023-08-02 DIAGNOSIS — I700 Atherosclerosis of aorta: Secondary | ICD-10-CM | POA: Diagnosis not present

## 2023-08-02 DIAGNOSIS — I10 Essential (primary) hypertension: Secondary | ICD-10-CM | POA: Insufficient documentation

## 2023-08-02 DIAGNOSIS — K7689 Other specified diseases of liver: Secondary | ICD-10-CM | POA: Diagnosis not present

## 2023-08-02 DIAGNOSIS — D329 Benign neoplasm of meninges, unspecified: Secondary | ICD-10-CM | POA: Insufficient documentation

## 2023-08-02 DIAGNOSIS — D3502 Benign neoplasm of left adrenal gland: Secondary | ICD-10-CM | POA: Insufficient documentation

## 2023-08-02 DIAGNOSIS — R222 Localized swelling, mass and lump, trunk: Secondary | ICD-10-CM | POA: Diagnosis not present

## 2023-08-02 DIAGNOSIS — R911 Solitary pulmonary nodule: Secondary | ICD-10-CM | POA: Diagnosis not present

## 2023-08-02 DIAGNOSIS — Z79899 Other long term (current) drug therapy: Secondary | ICD-10-CM | POA: Insufficient documentation

## 2023-08-02 DIAGNOSIS — Z8249 Family history of ischemic heart disease and other diseases of the circulatory system: Secondary | ICD-10-CM | POA: Insufficient documentation

## 2023-08-02 DIAGNOSIS — R221 Localized swelling, mass and lump, neck: Secondary | ICD-10-CM | POA: Insufficient documentation

## 2023-08-02 DIAGNOSIS — Z825 Family history of asthma and other chronic lower respiratory diseases: Secondary | ICD-10-CM | POA: Diagnosis not present

## 2023-08-02 DIAGNOSIS — Z9049 Acquired absence of other specified parts of digestive tract: Secondary | ICD-10-CM | POA: Diagnosis not present

## 2023-08-02 DIAGNOSIS — N281 Cyst of kidney, acquired: Secondary | ICD-10-CM | POA: Insufficient documentation

## 2023-08-02 DIAGNOSIS — K5730 Diverticulosis of large intestine without perforation or abscess without bleeding: Secondary | ICD-10-CM | POA: Insufficient documentation

## 2023-08-02 DIAGNOSIS — Z90710 Acquired absence of both cervix and uterus: Secondary | ICD-10-CM | POA: Insufficient documentation

## 2023-08-02 NOTE — Progress Notes (Signed)
[facility] [place] [address]  [place], [place] [address] [phone]   New Patient Evaluation  Date of Service: 08/02/23 Patient Name: Stephanie Newton Patient MRN: [id number] Patient DOB: June 27, 1941 Provider: Henreitta Leber, MD  Identifying Statement:  Stephanie Newton is a 82 y.o. female with Brain mass who presents for initial consultation and evaluation regarding cancer associated neurologic deficits.    Referring Provider: Emilio Aspen, MD [address] [place],  [place] [address]  Primary Cancer: pending pathology  Oncologic History: Oncology History   No history exists.    History of Present Illness: The patient's records from the referring physician were obtained and reviewed and the patient interviewed to confirm this HPI.  Stephanie Newton presents today to review new finding on brain MRI, which was obtained as a screening measure for new lung cancer diagnosis.  She denies neurologic symptoms at this time.  She does have remote history of severe headaches.  Medications: Current Outpatient Medications on File Prior to Visit  Medication Sig Dispense Refill   amLODipine (NORVASC) 2.5 MG tablet Take 2.5 mg by mouth daily.     aspirin EC 81 MG tablet Take 1 tablet by mouth daily.     estradiol (ESTRACE) 1 MG tablet Take 1 mg by mouth daily.     famotidine (PEPCID) 20 MG tablet Take 20 mg by mouth as needed.     hydrochlorothiazide (HYDRODIURIL) 12.5 MG tablet Take 12.5 mg by mouth daily.      methocarbamol (ROBAXIN) 500 MG tablet Take 1 tablet (500 mg total) by mouth every 6 (six) hours as needed for muscle spasms. 40 tablet 0   metoprolol succinate (TOPROL XL) 50 MG 24 hr tablet Take 1 tablet (50 mg total) by mouth daily. 90 tablet 3   metoprolol succinate (TOPROL-XL) 100 MG 24 hr tablet Take 100 mg by mouth daily.     oxyCODONE-acetaminophen (PERCOCET/ROXICET) 5-325 MG tablet Take 1-2 tablets by mouth every  8 (eight) hours as needed for severe pain. 6 tablet 0   simvastatin (ZOCOR) 20 MG tablet Take 20 mg by mouth at bedtime.     No current facility-administered medications on file prior to visit.    Allergies:  Allergies  Allergen Reactions   Shellfish Allergy Nausea Only   Past Medical History:  Past Medical History:  Diagnosis Date   Hypercholesteremia    Hypertension    Past Surgical History:  Past Surgical History:  Procedure Laterality Date   BREAST EXCISIONAL BIOPSY Right    x2   EYE SURGERY     cataract bilateral    HERNIA REPAIR  06/2000   INCISIONAL VENTRAL ; DR Carman Ching    KNEE ARTHROSCOPY Left 08/23/2017   dr Thomasena Edis    LAPAROSCOPIC CHOLECYSTECTOMY  06/13/2000   DR DOUG [facility]    LEFT HEART CATH AND CORONARY ANGIOGRAPHY N/A 12/04/2018   Procedure: LEFT HEART CATH AND CORONARY ANGIOGRAPHY;  Surgeon: Lyn Records, MD;  Location: [facility] INVASIVE [facility];  Service: Cardiovascular;  Laterality: N/A;   TOTAL KNEE ARTHROPLASTY Left 03/31/2018   Procedure: LEFT TOTAL KNEE ARTHROPLASTY;  Surgeon: Eugenia Mcalpine, MD;  Location: [facility] ORS;  Service: Orthopedics;  Laterality: Left;  with block   TOTAL KNEE ARTHROPLASTY Right 04/30/2020   Procedure: RIGHT TOTAL KNEE ARTHROPLASTY;  Surgeon: Ollen Gross, MD;  Location: [facility] ORS;  Service: Orthopedics;  Laterality: Right;    VAGINAL HYSTERECTOMY  age 56s  Social History:  Social History   Socioeconomic History   Marital status: Married    Spouse name: Not on file   Number of children: Not on file   Years of education: Not on file   Highest education level: Not on file  Occupational History   Not on file  Tobacco Use   Smoking status: Former    Types: Cigarettes   Smokeless tobacco: Never   Tobacco comments:    6 cigarettes a day  Vaping Use   Vaping status: Never Used  Substance and Sexual Activity   Alcohol use: Yes    Comment: occasionally    Drug use: No   Sexual activity: Yes  Other Topics Concern    Not on file  Social History Narrative   Not on file   Social Determinants of Health   Financial Resource Strain: Unknown (12/04/2018)   Overall Financial Resource Strain (CARDIA)    Difficulty of Paying Living Expenses: Patient declined  Food Insecurity: No Food Insecurity (08/02/2023)   Hunger Vital Sign    Worried About Running Out of Food in the Last Year: Never true    Ran Out of Food in the Last Year: Never true  Transportation Needs: No Transportation Needs (08/02/2023)   PRAPARE - Administrator, Civil Service (Medical): No    Lack of Transportation (Non-Medical): No  Physical Activity: Unknown (12/04/2018)   Exercise Vital Sign    Days of Exercise per Week: Patient declined    Minutes of Exercise per Session: Patient declined  Stress: Unknown (12/04/2018)   [employer] of Occupational Health - Occupational Stress Questionnaire    Feeling of Stress : Patient declined  Social Connections: Unknown (12/04/2018)   Social Connection and Isolation Panel [NHANES]    Frequency of Communication with Friends and Family: Patient declined    Frequency of Social Gatherings with Friends and Family: Patient declined    Attends Religious Services: Patient declined    Active Member of Clubs or Organizations: Patient declined    Attends Banker Meetings: Patient declined    Marital Status: Patient declined  Intimate Partner Violence: Not At Risk (08/02/2023)   Humiliation, Afraid, Rape, and Kick questionnaire    Fear of Current or Ex-Partner: No    Emotionally Abused: No    Physically Abused: No    Sexually Abused: No   Family History:  Family History  Problem Relation Age of Onset   Heart attack Mother    Pneumonia Brother     Review of Systems: Constitutional: Doesn't report fevers, chills or abnormal weight loss Eyes: Doesn't report blurriness of vision Ears, nose, mouth, throat, and face: Doesn't report sore throat Respiratory: Doesn't report  cough, dyspnea or wheezes Cardiovascular: Doesn't report palpitation, chest discomfort  Gastrointestinal:  Doesn't report nausea, constipation, diarrhea GU: Doesn't report incontinence Skin: Doesn't report skin rashes Neurological: Per HPI Musculoskeletal: Doesn't report joint pain Behavioral/Psych: Doesn't report anxiety  Physical Exam: Wt Readings from Last 3 Encounters:  08/02/23 172 lb 4.8 oz (78.2 kg)  07/27/23 170 lb (77.1 kg)  06/29/23 170 lb 12.8 oz (77.5 kg)   Temp Readings from Last 3 Encounters:  08/02/23 97.6 F (36.4 C) (Oral)  07/27/23 98.2 F (36.8 C) (Oral)  06/29/23 98.2 F (36.8 C) (Oral)   BP Readings from Last 3 Encounters:  08/02/23 135/76  07/27/23 107/82  06/29/23 138/77   Pulse Readings from Last 3 Encounters:  08/02/23 69  07/27/23 (!) 58  06/29/23 64  KPS: 90. General: Alert, cooperative, pleasant, in no acute distress Head: Normal EENT: No conjunctival injection or scleral icterus.  Lungs: Resp effort normal Cardiac: Regular rate Abdomen: Non-distended abdomen Skin: No rashes cyanosis or petechiae. Extremities: No clubbing or edema  Neurologic Exam: Mental Status: Awake, alert, attentive to examiner. Oriented to self and environment. Language is fluent with intact comprehension.  Cranial Nerves: Visual acuity is grossly normal. Visual fields are full. Extra-ocular movements intact. No ptosis. Face is symmetric Motor: Tone and bulk are normal. Power is full in both arms and legs. Reflexes are symmetric, no pathologic reflexes present.  Sensory: Intact to light touch Gait: Normal.   Labs: I have reviewed the data as listed    Component Value Date/Time   NA 137 07/27/2023 1130   NA 139 10/19/2022 1030   K 3.4 (L) 07/27/2023 1130   CL 102 07/27/2023 1130   CO2 26 07/27/2023 1130   GLUCOSE 114 (H) 07/27/2023 1130   BUN 12 07/27/2023 1130   BUN 13 10/19/2022 1030   CREATININE 0.78 07/27/2023 1130   CREATININE 0.89 06/29/2023 1008    CALCIUM 9.6 07/27/2023 1130   PROT 6.7 06/29/2023 1008   ALBUMIN 4.1 06/29/2023 1008   AST 25 06/29/2023 1008   ALT 14 06/29/2023 1008   ALKPHOS 77 06/29/2023 1008   BILITOT 0.5 06/29/2023 1008   GFRNONAA >60 07/27/2023 1130   GFRNONAA >60 06/29/2023 1008   GFRAA 71 11/11/2020 0940   Lab Results  Component Value Date   WBC 3.6 (L) 07/27/2023   NEUTROABS 1.9 07/27/2023   HGB 13.3 07/27/2023   HCT 40.4 07/27/2023   MCV 88.6 07/27/2023   PLT 214 07/27/2023    Imaging:  [place] CORE BIOPSY (LYMPH NODES)  Result Date: 07/27/2023 INDICATION: 82 year old woman with history of dizziness and left shoulder and neck pain underwent chest abdomen pelvis CT which showed necrotic mass in the left supraclavicular region. Subsequent PET-CT showed centrally necrotic left supraclavicular mass with an SUV of 4.6. FDG avid lung nodules are also seen. She presents to IR today for biopsy/aspiration of left supraclavicular mass. EXAM: Ultrasound-guided aspiration of left supraclavicular mass MEDICATIONS: None. ANESTHESIA/SEDATION: Moderate (conscious) sedation was employed during this procedure. A total of Versed 1 mg and Fentanyl 75 mcg was administered intravenously by the radiology nurse. Total intra-service moderate Sedation Time: 21 minutes. The patient's level of consciousness and vital signs were monitored continuously by radiology nursing throughout the procedure under my direct supervision. COMPLICATIONS: None immediate. PROCEDURE: Informed written consent was obtained from the patient after a thorough discussion of the procedural risks, benefits and alternatives. All questions were addressed. Maximal Sterile Barrier Technique was utilized including caps, mask, sterile gowns, sterile gloves, sterile drape, hand hygiene and skin antiseptic. A timeout was performed prior to the initiation of the procedure. Patient positioned supine on the procedure table. Ultrasound evaluation of the left neck mass showed it  to be predominantly necrotic. The overlying skin was prepped and draped in the usual fashion. Following local lidocaine administration, the fluid component was accessed with a 17 gauge needle. Proximally 2 mL of straw-colored fluid was aspirated. Aspiration of the fluid resulted in severe pain in the patient's left arm and hand. The procedure was therefore terminated. IMPRESSION: Ultrasound-guided aspiration of left supraclavicular mass yielding 2 mL of straw-colored fluid. The fluid was sent for cytology. Procedure was terminated at this point and no core samples were obtained given that aspiration of the fluid caused severe pain in the patient's left  arm and hand. These findings are suspicious for a nerve sheath tumor. If repeat biopsy is required, consider surgical consultation. Electronically Signed   By: Acquanetta Belling M.D.   On: 07/27/2023 15:34   MR Brain W Wo Contrast  Result Date: 07/15/2023 CLINICAL DATA:  Metastatic disease evaluation.  Lung cancer. EXAM: MRI HEAD WITHOUT AND WITH CONTRAST TECHNIQUE: Multiplanar, multiecho pulse sequences of the brain and surrounding structures were obtained without and with intravenous contrast. CONTRAST:  7mL GADAVIST GADOBUTROL 1 MMOL/ML IV SOLN COMPARISON:  None Available. FINDINGS: Brain: Enhancing dural-based lesion extends inferiorly and medially from the left side of the tentorium with minimal mass effect on the left midbrain and pons. The lesion measures 10 x 8 x 14 mm. No other dural-based lesions are present. Postcontrast images demonstrate no pathologic enhancement. No significant white matter lesions are present. Deep brain nuclei are within normal limits. The ventricles are of normal size. No significant extraaxial fluid collection is present. The brainstem and cerebellum are within normal limits. The internal auditory canals are within normal limits. Midline structures are within normal limits. Vascular: Flow is present in the major intracranial arteries.  Skull and upper cervical spine: The craniocervical junction is normal. Upper cervical spine is within normal limits. Marrow signal is unremarkable. Sinuses/Orbits: The paranasal sinuses and mastoid air cells are clear. The globes and orbits are within normal limits. IMPRESSION: 1. 10 x 8 x 14 mm enhancing dural-based lesion extends inferiorly and medially from the left side of the tentorium with minimal mass effect on the left midbrain and pons. This likely represents a meningioma. 2. Otherwise normal MRI appearance of the brain. No evidence for metastatic disease to the brain. Electronically Signed   By: Marin Roberts M.D.   On: 07/15/2023 14:01   NM PET Image Initial (PI) Skull Base To Thigh  Result Date: 07/12/2023 CLINICAL DATA:  Initial treatment strategy for pulmonary nodule. EXAM: NUCLEAR MEDICINE PET SKULL BASE TO THIGH TECHNIQUE: 9.3 mCi F-18 FDG was injected intravenously. Full-ring PET imaging was performed from the skull base to thigh after the radiotracer. CT data was obtained and used for attenuation correction and anatomic localization. Fasting blood glucose: 101 mg/dl COMPARISON:  CT chest abdomen pelvis dated 06/25/2023 FINDINGS: Mediastinal blood pool activity: SUV max 2.9 Liver activity: SUV max NA NECK: No hypermetabolic cervical lymphadenopathy. Mild bilateral carotid hypermetabolism, measuring up to 6.9 on the left, although favored to be related to vascular uptake when correlating with CT. Incidental CT findings: None. CHEST: 2.4 cm left supraclavicular node (series 4/image 33), with central necrosis, max SUV 4.6. 13 mm subpleural nodule in the posterior left upper lobe (series 4/image 45), max SUV 5.8. 9 mm subpleural nodule in the posterior left lower lobe (series 4/image 50), max SUV 5.0. Incidental CT findings: None. ABDOMEN/PELVIS: Heterogeneous low-density in the central right liver, favoring geographic hepatic steatosis, without hypermetabolism on PET. Additional 16 mm left  hepatic cyst (series 4/image 33). No abnormal hypermetabolism in the spleen, pancreas, or adrenal glands. 15 mm left adrenal nodule just anterior to the left kidney (series 4/image 81), measuring less than 0 HUs on unenhanced CT, compatible with a benign adrenal adenoma. Very mild irregular short-segment wall thickening at the splenic flexure (series 4/image 31), with mild hypermetabolism, max SUV 4.3. No hypermetabolic abdominopelvic lymphadenopathy. Incidental CT findings: Status post cholecystectomy. 3.9 cm simple right upper pole renal cyst (series 4/image 37), benign. No follow-up is recommended. Atherosclerotic calcifications the abdominal aorta and branch vessels. Prior small bowel resection  with suture line in the anterior lower abdomen (series 4/image 115). Left colonic diverticulosis, without evidence of diverticulitis. SKELETON: No focal hypermetabolic activity to suggest skeletal metastasis. Incidental CT findings: Degenerative changes of the visualized thoracolumbar spine. IMPRESSION: Two left lung nodules measuring up to 13 mm, suspicious for primary bronchogenic carcinoma and/or metastases. 2.4 cm left supraclavicular nodal metastasis. Tissue sampling is suggested. Mild short segment wall thickening involving the splenic flexure of the colon, with associated mild hypermetabolism. This is nonspecific. Consider colonoscopy for further evaluation, as clinically warranted. 15 mm left adrenal adenoma, benign. Electronically Signed   By: Charline Bills M.D.   On: 07/12/2023 23:43      Assessment/Plan Brain mass  Stephanie Newton presents with radiographic syndrome c/w dural based skull base mass.  Etiology is suspected meningioma; also could represent dural based metastasis from lung primary, though less likely.  Recommended repeating MRI in 3 months to confirm benign etiology.  She is agreeable with this.  If any neurologic symptoms develop, we will move up scan and re-image sooner.  We  spent twenty additional minutes teaching regarding the natural history, biology, and historical experience in the treatment of neurologic complications of cancer.   We appreciate the opportunity to participate in the care of Stephanie Newton.   We ask that Stephanie Newton return to clinic in 3 months following next brain MRI, or sooner as needed.  All questions were answered. The patient knows to call the clinic with any problems, questions or concerns. No barriers to learning were detected.  The total time spent in the encounter was 40 minutes and more than 50% was on counseling and review of test results   Henreitta Leber, MD Medical Director of Neuro-Oncology [facility] 08/02/23 10:13 AM

## 2023-08-06 ENCOUNTER — Other Ambulatory Visit: Payer: Self-pay | Admitting: Physician Assistant

## 2023-08-09 NOTE — Telephone Encounter (Signed)
Patient of Dr. Varanasi. Please review for refill. Thank you!  

## 2023-08-10 ENCOUNTER — Encounter: Payer: Self-pay | Admitting: Pulmonary Disease

## 2023-08-10 ENCOUNTER — Ambulatory Visit (INDEPENDENT_AMBULATORY_CARE_PROVIDER_SITE_OTHER): Payer: Medicare Other | Admitting: Pulmonary Disease

## 2023-08-10 VITALS — BP 120/70 | HR 71 | Ht 65.0 in | Wt 170.8 lb

## 2023-08-10 DIAGNOSIS — R942 Abnormal results of pulmonary function studies: Secondary | ICD-10-CM | POA: Diagnosis not present

## 2023-08-10 DIAGNOSIS — R911 Solitary pulmonary nodule: Secondary | ICD-10-CM

## 2023-08-10 DIAGNOSIS — G9389 Other specified disorders of brain: Secondary | ICD-10-CM

## 2023-08-10 NOTE — Progress Notes (Signed)
Synopsis: Referred in September 2024 for lung nodule by Emilio Aspen, *  Subjective:   PATIENT ID: Stephanie Newton: female DOB: [birth year], MRN: [id number]  Chief Complaint  Patient presents with   Consult    Consult on bronch for lung nodule.    This is a 82 year old female, past medical history of hypercholesterolemia, hypertension, history of former smoker.  Patient was referred after having imaging concerning for a left upper lobe malignancy.  She has a small nodule as well as associated supraclavicular adenopathy.  They biopsied the lymph node which was nondiagnostic.  She also had MRI imaging of the head which was concern for a potential dural metastasis versus meningioma.  She has seen Dr. Barbaraann Cao from neuro-oncology and has already established care with medical oncology with Dr. Shirline Frees.  Recommending now to have a robotic assisted navigational bronchoscopy for tissue sampling of the left upper lobe nodule confirmed malignancy.      Past Medical History:  Diagnosis Date   Hypercholesteremia    Hypertension      Family History  Problem Relation Age of Onset   Heart attack Mother    Pneumonia Brother      Past Surgical History:  Procedure Laterality Date   BREAST EXCISIONAL BIOPSY Right    x2   EYE SURGERY     cataract bilateral    HERNIA REPAIR  06/2000   INCISIONAL VENTRAL ; DR Carman Ching    KNEE ARTHROSCOPY Left 08/23/2017   dr Thomasena Edis    LAPAROSCOPIC CHOLECYSTECTOMY  06/13/2000   DR DOUG [facility]    LEFT HEART CATH AND CORONARY ANGIOGRAPHY N/A 12/04/2018   Procedure: LEFT HEART CATH AND CORONARY ANGIOGRAPHY;  Surgeon: Lyn Records, MD;  Location: [facility];  Service: Cardiovascular;  Laterality: N/A;   TOTAL KNEE ARTHROPLASTY Left 03/31/2018   Procedure: LEFT TOTAL KNEE ARTHROPLASTY;  Surgeon: Eugenia Mcalpine, MD;  Location: [facility];  Service: Orthopedics;  Laterality: Left;  with block   TOTAL KNEE ARTHROPLASTY Right 04/30/2020    Procedure: RIGHT TOTAL KNEE ARTHROPLASTY;  Surgeon: Ollen Gross, MD;  Location: [facility];  Service: Orthopedics;  Laterality: Right;    VAGINAL HYSTERECTOMY  age 61s    Social History   Socioeconomic History   Marital status: Married    Spouse name: Not on file   Number of children: Not on file   Years of education: Not on file   Highest education level: Not on file  Occupational History   Not on file  Tobacco Use   Smoking status: Former    Types: Cigarettes   Smokeless tobacco: Never   Tobacco comments:    6 cigarettes a day  Vaping Use   Vaping status: Never Used  Substance and Sexual Activity   Alcohol use: Yes    Comment: occasionally    Drug use: No   Sexual activity: Yes  Other Topics Concern   Not on file  Social History Narrative   Not on file   Social Determinants of Health   Financial Resource Strain: Unknown (12/04/2018)   Overall Financial Resource Strain (CARDIA)    Difficulty of Paying Living Expenses: Patient declined  Food Insecurity: No Food Insecurity (08/02/2023)   Hunger Vital Sign    Worried About Running Out of Food in the Last Year: Never true    Ran Out of Food in the Last Year: Never true  Transportation Needs: No Transportation Needs (08/02/2023)   PRAPARE - Transportation  Lack of Transportation (Medical): No    Lack of Transportation (Non-Medical): No  Physical Activity: Unknown (12/04/2018)   Exercise Vital Sign    Days of Exercise per Week: Patient declined    Minutes of Exercise per Session: Patient declined  Stress: Unknown (12/04/2018)   [employer] of Occupational Health - Occupational Stress Questionnaire    Feeling of Stress : Patient declined  Social Connections: Unknown (12/04/2018)   Social Connection and Isolation Panel [NHANES]    Frequency of Communication with Friends and Family: Patient declined    Frequency of Social Gatherings with Friends and Family: Patient declined    Attends Religious Services:  Patient declined    Database administrator or Organizations: Patient declined    Attends Banker Meetings: Patient declined    Marital Status: Patient declined  Intimate Partner Violence: Not At Risk (08/02/2023)   Humiliation, Afraid, Rape, and Kick questionnaire    Fear of Current or Ex-Partner: No    Emotionally Abused: No    Physically Abused: No    Sexually Abused: No     Allergies  Allergen Reactions   Shellfish Allergy Nausea Only     Outpatient Medications Prior to Visit  Medication Sig Dispense Refill   amLODipine (NORVASC) 2.5 MG tablet Take 2.5 mg by mouth daily.     aspirin EC 81 MG tablet Take 1 tablet by mouth daily.     estradiol (ESTRACE) 1 MG tablet Take 1 mg by mouth daily.     famotidine (PEPCID) 20 MG tablet Take 20 mg by mouth as needed.     hydrochlorothiazide (HYDRODIURIL) 12.5 MG tablet Take 12.5 mg by mouth daily.      methocarbamol (ROBAXIN) 500 MG tablet Take 1 tablet (500 mg total) by mouth every 6 (six) hours as needed for muscle spasms. 40 tablet 0   metoprolol succinate (TOPROL-XL) 100 MG 24 hr tablet Take 100 mg by mouth daily.     metoprolol succinate (TOPROL-XL) 50 MG 24 hr tablet Take 1 tablet (50 mg total) by mouth daily. 90 tablet 1   oxyCODONE-acetaminophen (PERCOCET/ROXICET) 5-325 MG tablet Take 1-2 tablets by mouth every 8 (eight) hours as needed for severe pain. 6 tablet 0   simvastatin (ZOCOR) 20 MG tablet Take 20 mg by mouth at bedtime.     No facility-administered medications prior to visit.    Review of Systems  Constitutional:  Negative for chills, fever, malaise/fatigue and weight loss.  HENT:  Negative for hearing loss, sore throat and tinnitus.   Eyes:  Negative for blurred vision and double vision.  Respiratory:  Negative for cough, hemoptysis, sputum production, shortness of breath, wheezing and stridor.   Cardiovascular:  Negative for chest pain, palpitations, orthopnea, leg swelling and PND.  Gastrointestinal:   Negative for abdominal pain, constipation, diarrhea, heartburn, nausea and vomiting.  Genitourinary:  Negative for dysuria, hematuria and urgency.  Musculoskeletal:  Negative for joint pain and myalgias.  Skin:  Negative for itching and rash.  Neurological:  Negative for dizziness, tingling, weakness and headaches.  Endo/Heme/Allergies:  Negative for environmental allergies. Does not bruise/bleed easily.  Psychiatric/Behavioral:  Negative for depression. The patient is not nervous/anxious and does not have insomnia.   All other systems reviewed and are negative.    Objective:  Physical Exam Vitals reviewed.  Constitutional:      General: She is not in acute distress.    Appearance: She is well-developed.  HENT:     Head: Normocephalic and atraumatic.  Eyes:     General: No scleral icterus.    Conjunctiva/sclera: Conjunctivae normal.     Pupils: Pupils are equal, round, and reactive to light.  Neck:     Vascular: No JVD.     Trachea: No tracheal deviation.  Cardiovascular:     Rate and Rhythm: Normal rate and regular rhythm.     Heart sounds: Normal heart sounds. No murmur heard. Pulmonary:     Effort: Pulmonary effort is normal. No tachypnea, accessory muscle usage or respiratory distress.     Breath sounds: No stridor. No wheezing, rhonchi or rales.  Abdominal:     General: There is no distension.     Palpations: Abdomen is soft.     Tenderness: There is no abdominal tenderness.  Musculoskeletal:        General: No tenderness.     Cervical back: Neck supple.  Lymphadenopathy:     Cervical: No cervical adenopathy.  Skin:    General: Skin is warm and dry.     Capillary Refill: Capillary refill takes less than 2 seconds.     Findings: No rash.  Neurological:     Mental Status: She is alert and oriented to person, place, and time.  Psychiatric:        Behavior: Behavior normal.      Vitals:   08/10/23 0903  BP: 120/70  Pulse: 71  SpO2: 97%  Weight: 170 lb 12.8  oz (77.5 kg)  Height: 5\' 5"  (1.651 m)   97% on RA BMI Readings from Last 3 Encounters:  08/10/23 28.42 kg/m  08/02/23 28.67 kg/m  07/27/23 28.29 kg/m   Wt Readings from Last 3 Encounters:  08/10/23 170 lb 12.8 oz (77.5 kg)  08/02/23 172 lb 4.8 oz (78.2 kg)  07/27/23 170 lb (77.1 kg)     CBC    Component Value Date/Time   WBC 3.6 (L) 07/27/2023 1130   RBC 4.56 07/27/2023 1130   HGB 13.3 07/27/2023 1130   HGB 14.3 06/29/2023 1008   HCT 40.4 07/27/2023 1130   PLT 214 07/27/2023 1130   PLT 250 06/29/2023 1008   MCV 88.6 07/27/2023 1130   MCH 29.2 07/27/2023 1130   MCHC 32.9 07/27/2023 1130   RDW 13.7 07/27/2023 1130   LYMPHSABS 1.2 07/27/2023 1130   MONOABS 0.4 07/27/2023 1130   EOSABS 0.1 07/27/2023 1130   BASOSABS 0.0 07/27/2023 1130     Chest Imaging:  Nuclear medicine pet imaging 07/06/2023: Patient found to have hypermetabolic lesion within the left upper lobe concerning for malignancy. The patient's images have been independently reviewed by me.    Pulmonary Functions Testing Results:     No data to display          FeNO:   Pathology:   Echocardiogram:   Heart Catheterization:     Assessment & Plan:     ICD-10-CM   1. Lung nodule  R91.1 Procedural/ Surgical Case Request: ROBOTIC ASSISTED NAVIGATIONAL BRONCHOSCOPY    Ambulatory referral to Pulmonology    2. Brain mass  G93.89     3. Abnormal PET of left lung  R94.2       Discussion:  This is a 81 year old female seen today with abnormal lung pet imaging with a left upper lobe nodule concerning for malignancy. She is also being evaluated for concern of a brain mass either meningioma versus dural metastasis.  She had a biopsy of the lymph node in her cervical region which was nondiagnostic and is now referred  for bronchoscopy and biopsy.  Plan: Today in the office to about the risk benefits alternatives of proceeding with robotic assisted navigational bronchoscopy versus CT-guided needle  biopsy. We talked about the risk of bleeding and pneumothorax associated with both. Patient is agreeable to proceed with robotic assisted navigational bronchoscopy. Tentative bronchoscopy date will be on 08/30/2023    Current Outpatient Medications:    amLODipine (NORVASC) 2.5 MG tablet, Take 2.5 mg by mouth daily., Disp: , Rfl:    aspirin EC 81 MG tablet, Take 1 tablet by mouth daily., Disp: , Rfl:    estradiol (ESTRACE) 1 MG tablet, Take 1 mg by mouth daily., Disp: , Rfl:    famotidine (PEPCID) 20 MG tablet, Take 20 mg by mouth as needed., Disp: , Rfl:    hydrochlorothiazide (HYDRODIURIL) 12.5 MG tablet, Take 12.5 mg by mouth daily. , Disp: , Rfl:    methocarbamol (ROBAXIN) 500 MG tablet, Take 1 tablet (500 mg total) by mouth every 6 (six) hours as needed for muscle spasms., Disp: 40 tablet, Rfl: 0   metoprolol succinate (TOPROL-XL) 100 MG 24 hr tablet, Take 100 mg by mouth daily., Disp: , Rfl:    metoprolol succinate (TOPROL-XL) 50 MG 24 hr tablet, Take 1 tablet (50 mg total) by mouth daily., Disp: 90 tablet, Rfl: 1   oxyCODONE-acetaminophen (PERCOCET/ROXICET) 5-325 MG tablet, Take 1-2 tablets by mouth every 8 (eight) hours as needed for severe pain., Disp: 6 tablet, Rfl: 0   simvastatin (ZOCOR) 20 MG tablet, Take 20 mg by mouth at bedtime., Disp: , Rfl:    Josephine Igo, DO [facility] 08/10/2023 9:46 AM

## 2023-08-10 NOTE — H&P (View-Only) (Signed)
Synopsis: Referred in September 2024 for lung nodule by Emilio Aspen, *  Subjective:   PATIENT ID: Stephanie Newton: female DOB: [birth year], MRN: [id number]  Chief Complaint  Patient presents with   Consult    Consult on bronch for lung nodule.    This is a 82 year old female, past medical history of hypercholesterolemia, hypertension, history of former smoker.  Patient was referred after having imaging concerning for a left upper lobe malignancy.  She has a small nodule as well as associated supraclavicular adenopathy.  They biopsied the lymph node which was nondiagnostic.  She also had MRI imaging of the head which was concern for a potential dural metastasis versus meningioma.  She has seen Dr. Barbaraann Cao from neuro-oncology and has already established care with medical oncology with Dr. Shirline Frees.  Recommending now to have a robotic assisted navigational bronchoscopy for tissue sampling of the left upper lobe nodule confirmed malignancy.      Past Medical History:  Diagnosis Date   Hypercholesteremia    Hypertension      Family History  Problem Relation Age of Onset   Heart attack Mother    Pneumonia Brother      Past Surgical History:  Procedure Laterality Date   BREAST EXCISIONAL BIOPSY Right    x2   EYE SURGERY     cataract bilateral    HERNIA REPAIR  06/2000   INCISIONAL VENTRAL ; DR Carman Ching    KNEE ARTHROSCOPY Left 08/23/2017   dr Thomasena Edis    LAPAROSCOPIC CHOLECYSTECTOMY  06/13/2000   DR DOUG [facility]    LEFT HEART CATH AND CORONARY ANGIOGRAPHY N/A 12/04/2018   Procedure: LEFT HEART CATH AND CORONARY ANGIOGRAPHY;  Surgeon: Lyn Records, MD;  Location: [facility];  Service: Cardiovascular;  Laterality: N/A;   TOTAL KNEE ARTHROPLASTY Left 03/31/2018   Procedure: LEFT TOTAL KNEE ARTHROPLASTY;  Surgeon: Eugenia Mcalpine, MD;  Location: [facility];  Service: Orthopedics;  Laterality: Left;  with block   TOTAL KNEE ARTHROPLASTY Right 04/30/2020    Procedure: RIGHT TOTAL KNEE ARTHROPLASTY;  Surgeon: Ollen Gross, MD;  Location: [facility];  Service: Orthopedics;  Laterality: Right;    VAGINAL HYSTERECTOMY  age 61s    Social History   Socioeconomic History   Marital status: Married    Spouse name: Not on file   Number of children: Not on file   Years of education: Not on file   Highest education level: Not on file  Occupational History   Not on file  Tobacco Use   Smoking status: Former    Types: Cigarettes   Smokeless tobacco: Never   Tobacco comments:    6 cigarettes a day  Vaping Use   Vaping status: Never Used  Substance and Sexual Activity   Alcohol use: Yes    Comment: occasionally    Drug use: No   Sexual activity: Yes  Other Topics Concern   Not on file  Social History Narrative   Not on file   Social Determinants of Health   Financial Resource Strain: Unknown (12/04/2018)   Overall Financial Resource Strain (CARDIA)    Difficulty of Paying Living Expenses: Patient declined  Food Insecurity: No Food Insecurity (08/02/2023)   Hunger Vital Sign    Worried About Running Out of Food in the Last Year: Never true    Ran Out of Food in the Last Year: Never true  Transportation Needs: No Transportation Needs (08/02/2023)   PRAPARE - Transportation  Lack of Transportation (Medical): No    Lack of Transportation (Non-Medical): No  Physical Activity: Unknown (12/04/2018)   Exercise Vital Sign    Days of Exercise per Week: Patient declined    Minutes of Exercise per Session: Patient declined  Stress: Unknown (12/04/2018)   [employer] of Occupational Health - Occupational Stress Questionnaire    Feeling of Stress : Patient declined  Social Connections: Unknown (12/04/2018)   Social Connection and Isolation Panel [NHANES]    Frequency of Communication with Friends and Family: Patient declined    Frequency of Social Gatherings with Friends and Family: Patient declined    Attends Religious Services:  Patient declined    Database administrator or Organizations: Patient declined    Attends Banker Meetings: Patient declined    Marital Status: Patient declined  Intimate Partner Violence: Not At Risk (08/02/2023)   Humiliation, Afraid, Rape, and Kick questionnaire    Fear of Current or Ex-Partner: No    Emotionally Abused: No    Physically Abused: No    Sexually Abused: No     Allergies  Allergen Reactions   Shellfish Allergy Nausea Only     Outpatient Medications Prior to Visit  Medication Sig Dispense Refill   amLODipine (NORVASC) 2.5 MG tablet Take 2.5 mg by mouth daily.     aspirin EC 81 MG tablet Take 1 tablet by mouth daily.     estradiol (ESTRACE) 1 MG tablet Take 1 mg by mouth daily.     famotidine (PEPCID) 20 MG tablet Take 20 mg by mouth as needed.     hydrochlorothiazide (HYDRODIURIL) 12.5 MG tablet Take 12.5 mg by mouth daily.      methocarbamol (ROBAXIN) 500 MG tablet Take 1 tablet (500 mg total) by mouth every 6 (six) hours as needed for muscle spasms. 40 tablet 0   metoprolol succinate (TOPROL-XL) 100 MG 24 hr tablet Take 100 mg by mouth daily.     metoprolol succinate (TOPROL-XL) 50 MG 24 hr tablet Take 1 tablet (50 mg total) by mouth daily. 90 tablet 1   oxyCODONE-acetaminophen (PERCOCET/ROXICET) 5-325 MG tablet Take 1-2 tablets by mouth every 8 (eight) hours as needed for severe pain. 6 tablet 0   simvastatin (ZOCOR) 20 MG tablet Take 20 mg by mouth at bedtime.     No facility-administered medications prior to visit.    Review of Systems  Constitutional:  Negative for chills, fever, malaise/fatigue and weight loss.  HENT:  Negative for hearing loss, sore throat and tinnitus.   Eyes:  Negative for blurred vision and double vision.  Respiratory:  Negative for cough, hemoptysis, sputum production, shortness of breath, wheezing and stridor.   Cardiovascular:  Negative for chest pain, palpitations, orthopnea, leg swelling and PND.  Gastrointestinal:   Negative for abdominal pain, constipation, diarrhea, heartburn, nausea and vomiting.  Genitourinary:  Negative for dysuria, hematuria and urgency.  Musculoskeletal:  Negative for joint pain and myalgias.  Skin:  Negative for itching and rash.  Neurological:  Negative for dizziness, tingling, weakness and headaches.  Endo/Heme/Allergies:  Negative for environmental allergies. Does not bruise/bleed easily.  Psychiatric/Behavioral:  Negative for depression. The patient is not nervous/anxious and does not have insomnia.   All other systems reviewed and are negative.    Objective:  Physical Exam Vitals reviewed.  Constitutional:      General: She is not in acute distress.    Appearance: She is well-developed.  HENT:     Head: Normocephalic and atraumatic.  Eyes:     General: No scleral icterus.    Conjunctiva/sclera: Conjunctivae normal.     Pupils: Pupils are equal, round, and reactive to light.  Neck:     Vascular: No JVD.     Trachea: No tracheal deviation.  Cardiovascular:     Rate and Rhythm: Normal rate and regular rhythm.     Heart sounds: Normal heart sounds. No murmur heard. Pulmonary:     Effort: Pulmonary effort is normal. No tachypnea, accessory muscle usage or respiratory distress.     Breath sounds: No stridor. No wheezing, rhonchi or rales.  Abdominal:     General: There is no distension.     Palpations: Abdomen is soft.     Tenderness: There is no abdominal tenderness.  Musculoskeletal:        General: No tenderness.     Cervical back: Neck supple.  Lymphadenopathy:     Cervical: No cervical adenopathy.  Skin:    General: Skin is warm and dry.     Capillary Refill: Capillary refill takes less than 2 seconds.     Findings: No rash.  Neurological:     Mental Status: She is alert and oriented to person, place, and time.  Psychiatric:        Behavior: Behavior normal.      Vitals:   08/10/23 0903  BP: 120/70  Pulse: 71  SpO2: 97%  Weight: 170 lb 12.8  oz (77.5 kg)  Height: 5\' 5"  (1.651 m)   97% on RA BMI Readings from Last 3 Encounters:  08/10/23 28.42 kg/m  08/02/23 28.67 kg/m  07/27/23 28.29 kg/m   Wt Readings from Last 3 Encounters:  08/10/23 170 lb 12.8 oz (77.5 kg)  08/02/23 172 lb 4.8 oz (78.2 kg)  07/27/23 170 lb (77.1 kg)     CBC    Component Value Date/Time   WBC 3.6 (L) 07/27/2023 1130   RBC 4.56 07/27/2023 1130   HGB 13.3 07/27/2023 1130   HGB 14.3 06/29/2023 1008   HCT 40.4 07/27/2023 1130   PLT 214 07/27/2023 1130   PLT 250 06/29/2023 1008   MCV 88.6 07/27/2023 1130   MCH 29.2 07/27/2023 1130   MCHC 32.9 07/27/2023 1130   RDW 13.7 07/27/2023 1130   LYMPHSABS 1.2 07/27/2023 1130   MONOABS 0.4 07/27/2023 1130   EOSABS 0.1 07/27/2023 1130   BASOSABS 0.0 07/27/2023 1130     Chest Imaging:  Nuclear medicine pet imaging 07/06/2023: Patient found to have hypermetabolic lesion within the left upper lobe concerning for malignancy. The patient's images have been independently reviewed by me.    Pulmonary Functions Testing Results:     No data to display          FeNO:   Pathology:   Echocardiogram:   Heart Catheterization:     Assessment & Plan:     ICD-10-CM   1. Lung nodule  R91.1 Procedural/ Surgical Case Request: ROBOTIC ASSISTED NAVIGATIONAL BRONCHOSCOPY    Ambulatory referral to Pulmonology    2. Brain mass  G93.89     3. Abnormal PET of left lung  R94.2       Discussion:  This is a 81 year old female seen today with abnormal lung pet imaging with a left upper lobe nodule concerning for malignancy. She is also being evaluated for concern of a brain mass either meningioma versus dural metastasis.  She had a biopsy of the lymph node in her cervical region which was nondiagnostic and is now referred  for bronchoscopy and biopsy.  Plan: Today in the office to about the risk benefits alternatives of proceeding with robotic assisted navigational bronchoscopy versus CT-guided needle  biopsy. We talked about the risk of bleeding and pneumothorax associated with both. Patient is agreeable to proceed with robotic assisted navigational bronchoscopy. Tentative bronchoscopy date will be on 08/30/2023    Current Outpatient Medications:    amLODipine (NORVASC) 2.5 MG tablet, Take 2.5 mg by mouth daily., Disp: , Rfl:    aspirin EC 81 MG tablet, Take 1 tablet by mouth daily., Disp: , Rfl:    estradiol (ESTRACE) 1 MG tablet, Take 1 mg by mouth daily., Disp: , Rfl:    famotidine (PEPCID) 20 MG tablet, Take 20 mg by mouth as needed., Disp: , Rfl:    hydrochlorothiazide (HYDRODIURIL) 12.5 MG tablet, Take 12.5 mg by mouth daily. , Disp: , Rfl:    methocarbamol (ROBAXIN) 500 MG tablet, Take 1 tablet (500 mg total) by mouth every 6 (six) hours as needed for muscle spasms., Disp: 40 tablet, Rfl: 0   metoprolol succinate (TOPROL-XL) 100 MG 24 hr tablet, Take 100 mg by mouth daily., Disp: , Rfl:    metoprolol succinate (TOPROL-XL) 50 MG 24 hr tablet, Take 1 tablet (50 mg total) by mouth daily., Disp: 90 tablet, Rfl: 1   oxyCODONE-acetaminophen (PERCOCET/ROXICET) 5-325 MG tablet, Take 1-2 tablets by mouth every 8 (eight) hours as needed for severe pain., Disp: 6 tablet, Rfl: 0   simvastatin (ZOCOR) 20 MG tablet, Take 20 mg by mouth at bedtime., Disp: , Rfl:    Josephine Igo, DO [facility] 08/10/2023 9:46 AM

## 2023-08-10 NOTE — Patient Instructions (Signed)
Thank you for visiting Dr. Tonia Brooms at [facility]. Today we recommend the following:  Orders Placed This Encounter  Procedures   Procedural/ Surgical Case Request: ROBOTIC ASSISTED NAVIGATIONAL BRONCHOSCOPY   Ambulatory referral to Pulmonology   Return in about 5 weeks (around 09/13/2023) for with Kandice Robinsons, NP, after Bronchoscopy.    Please do your part to reduce the spread of COVID-19.

## 2023-08-12 ENCOUNTER — Telehealth: Payer: Self-pay | Admitting: Interventional Cardiology

## 2023-08-12 NOTE — Telephone Encounter (Signed)
Patient states when she previously found out Dr. Katrinka Blazing was leaving, he advised her to see Dr. Eldridge Dace instead of the provider she was initially going to transition to because of her diagnosis (coronary artery aneurysm). She would like to know if there is a specific provider Dr. Eldridge Dace recommends, per her diagnosis. Please advise.

## 2023-08-16 DIAGNOSIS — Z23 Encounter for immunization: Secondary | ICD-10-CM | POA: Diagnosis not present

## 2023-08-16 NOTE — Telephone Encounter (Signed)
I spoke with patient and scheduled her to see Dr Lynnette Caffey on February 09, 2024

## 2023-08-17 ENCOUNTER — Institutional Professional Consult (permissible substitution): Payer: Medicare Other | Admitting: Emergency Medicine

## 2023-08-19 ENCOUNTER — Telehealth: Payer: Self-pay | Admitting: Internal Medicine

## 2023-08-19 NOTE — Telephone Encounter (Signed)
Called patient regarding upcoming October appointments, patient is notified.  

## 2023-08-28 NOTE — Progress Notes (Signed)
[facility] OFFICE PROGRESS NOTE  Emilio Aspen, MD [address] [place] [address]  DIAGNOSIS: Stage IIIC (T3, N3, M0). She presented with supraclavicular lymphadenopathy, 2 left lung nodules suspicious for bronchogenic carcinoma and/or metastasis.  There is also a short segment of wall thickening involving the splenic flexure of the colon with associated mild hypermetabolism which is nonspecific and colonoscopy was recommended.   PRIOR THERAPY: None  CURRENT THERAPY: Concurrent chemoradiation with carboplatin for an AUC of 2 and taxol 45 mg/m2. The first dose is expected on   INTERVAL HISTORY: Stephanie Newton 82 y.o. female returns to the clinic today for a follow-up visit accompanied by her husband, son, and daughter.  In summary, the patient presented to the ER on 06/25/2023 for dizziness and left shoulder pain and imaging showed left supraclavicular mass with central low-density suspicious for necrotic lymph node and solid left lower lobe pulmonary nodules.  She had declined prior colonoscopy screenings and last mammogram was in 2024 which did not show any malignancy.  She had normal past pap smears in the past.   She was seen in the diagnostic clinic for further workup.  She had a PET scan and brain MRI. The PET showed two left lung nodules suspious for primary lung cancer and/or metastasis and hypermetabolic left supraclavicular nodal metastases. This also showed non-specific mild hypermetabolism in the splenic flexure. She had a ultrasound-guided core biopsy of the left supraclavicular lymph node which did not show any malignancy as they only aspirated the lymph node but this was stopped secondary to pain. The radiologist mentioned that the findings were suspicious for nerve sheath tumor and recommended surgical consultation the procedure.    She saw Dr. Delton Coombes who arranged for bronchoscopy and biopsy on 08/30/23. The final pathology showed non-small  cell lung cancer, adenocarcinoma. She had some intolerance to the anesthesia. She also had some significant soreness of her throat following the procedure but has recovered well.   She saw Dr. Barbaraann Cao to discuss the incidential meningoma on staging workup.    Since last being seen the patient denies any major changes in her health. The supraclavicular nodal metastasis does cause some pain/soreness that is positional. She states that this seems similar in size. She denies any fever, chills, night sweats, or unexplained weight loss. Her breathing is "normal". She denies chest pain, shortness of breath, cough, or hemoptysis.  Denies any nausea, vomiting, diarrhea, or constipation.  She denies any abdominal pain or blood in the stool.  She is here today for evaluation and for more detailed discussion about the next steps in her care.      MEDICAL HISTORY: Past Medical History:  Diagnosis Date   Arthritis    Dysrhythmia    Hypercholesteremia    Hypertension    Myocardial infarction [facility])     ALLERGIES:  is allergic to shellfish allergy.  MEDICATIONS:  Current Outpatient Medications  Medication Sig Dispense Refill   amLODipine (NORVASC) 2.5 MG tablet Take 2.5 mg by mouth daily.     aspirin EC 81 MG tablet Take 1 tablet by mouth daily.     azithromycin (ZITHROMAX) 250 MG tablet Take 2 tablets today, then one tablet each day for the next 6 days. 6 tablet 0   estradiol (ESTRACE) 1 MG tablet Take 1 mg by mouth daily.     famotidine (PEPCID) 20 MG tablet Take 20 mg by mouth as needed.     hydrochlorothiazide (HYDRODIURIL) 12.5 MG tablet  Take 12.5 mg by mouth daily.      methocarbamol (ROBAXIN) 500 MG tablet Take 1 tablet (500 mg total) by mouth every 6 (six) hours as needed for muscle spasms. 40 tablet 0   metoprolol succinate (TOPROL-XL) 100 MG 24 hr tablet Take 100 mg by mouth daily.     metoprolol succinate (TOPROL-XL) 50 MG 24 hr tablet Take 1 tablet (50 mg total) by mouth daily. 90 tablet 1    oxyCODONE-acetaminophen (PERCOCET/ROXICET) 5-325 MG tablet Take 1-2 tablets by mouth every 8 (eight) hours as needed for severe pain. 6 tablet 0   prochlorperazine (COMPAZINE) 10 MG tablet Take 1 tablet (10 mg total) by mouth every 6 (six) hours as needed. 30 tablet 2   simvastatin (ZOCOR) 20 MG tablet Take 20 mg by mouth at bedtime.     No current facility-administered medications for this visit.    SURGICAL HISTORY:  Past Surgical History:  Procedure Laterality Date   BREAST EXCISIONAL BIOPSY Right    x2   BRONCHIAL BIOPSY  08/30/2023   Procedure: BRONCHIAL BIOPSIES;  Surgeon: Josephine Igo, DO;  Location: [facility] ENDOSCOPY;  Service: Pulmonary;;   EYE SURGERY     cataract bilateral    HERNIA REPAIR  06/2000   INCISIONAL VENTRAL ; DR Carman Ching    KNEE ARTHROSCOPY Left 08/23/2017   dr Thomasena Edis    LAPAROSCOPIC CHOLECYSTECTOMY  06/13/2000   DR DOUG [facility]    LEFT HEART CATH AND CORONARY ANGIOGRAPHY N/A 12/04/2018   Procedure: LEFT HEART CATH AND CORONARY ANGIOGRAPHY;  Surgeon: Lyn Records, MD;  Location: [facility] INVASIVE CV LAB;  Service: Cardiovascular;  Laterality: N/A;   TOTAL KNEE ARTHROPLASTY Left 03/31/2018   Procedure: LEFT TOTAL KNEE ARTHROPLASTY;  Surgeon: Eugenia Mcalpine, MD;  Location: [facility] ORS;  Service: Orthopedics;  Laterality: Left;  with block   TOTAL KNEE ARTHROPLASTY Right 04/30/2020   Procedure: RIGHT TOTAL KNEE ARTHROPLASTY;  Surgeon: Ollen Gross, MD;  Location: [facility] ORS;  Service: Orthopedics;  Laterality: Right;    VAGINAL HYSTERECTOMY  age 6s    REVIEW OF SYSTEMS:   Review of Systems  Constitutional: Negative for appetite change, chills, fatigue, fever and unexpected weight change.  HENT: Positive for soreness of the supraclavicular lymph node (left).  Negative for mouth sores, nosebleeds, sore throat and trouble swallowing.   Eyes: Negative for eye problems and icterus.  Respiratory: Negative for cough, hemoptysis, shortness of breath and wheezing.    Cardiovascular: Negative for chest pain and leg swelling.  Gastrointestinal: Negative for abdominal pain, constipation, diarrhea, nausea and vomiting.  Genitourinary: Negative for bladder incontinence, difficulty urinating, dysuria, frequency and hematuria.   Musculoskeletal: Negative for back pain, gait problem, neck pain and neck stiffness.  Skin: Negative for itching and rash.  Neurological: Negative for dizziness, extremity weakness, gait problem, headaches, light-headedness and seizures.  Hematological: Negative for adenopathy. Does not bruise/bleed easily.  Psychiatric/Behavioral: Negative for confusion, depression and sleep disturbance. The patient is not nervous/anxious.     PHYSICAL EXAMINATION:  Blood pressure 136/87, pulse 67, temperature 98.2 F (36.8 C), temperature source Oral, resp. rate 13, weight 171 lb (77.6 kg), SpO2 98%.  ECOG PERFORMANCE STATUS: 0-1  Physical Exam  Constitutional: Oriented to person, place, and time and well-developed, well-nourished, and in no distress.  HENT:  Head: Normocephalic and atraumatic.  Mouth/Throat: Oropharynx is clear and moist. No oropharyngeal exudate.  Eyes: Conjunctivae are normal. Right eye exhibits no discharge. Left eye exhibits no discharge. No scleral icterus.  Neck:  Normal range of motion. Neck supple.  Cardiovascular: Normal rate, regular rhythm, normal heart sounds and intact distal pulses.   Pulmonary/Chest: Effort normal and breath sounds normal. No respiratory distress. No wheezes. No rales.  Abdominal: Soft. Bowel sounds are normal. Exhibits no distension and no mass. There is no tenderness.  Musculoskeletal: Normal range of motion. Exhibits no edema.  Lymphadenopathy:  Left supraclavicular lymphadenopathy Neurological: Alert and oriented to person, place, and time. Exhibits normal muscle tone. Gait normal. Coordination normal.  Skin: Skin is warm and dry. No rash noted. Not diaphoretic. No erythema. No pallor.   Psychiatric: Mood, memory and judgment normal.  Vitals reviewed.  LABORATORY DATA: Lab Results  Component Value Date   WBC 3.9 (L) 09/06/2023   HGB 13.6 09/06/2023   HCT 40.5 09/06/2023   MCV 88.4 09/06/2023   PLT 251 09/06/2023      Chemistry      Component Value Date/Time   NA 139 09/06/2023 1506   NA 139 10/19/2022 1030   K 3.7 09/06/2023 1506   CL 104 09/06/2023 1506   CO2 28 09/06/2023 1506   BUN 13 09/06/2023 1506   BUN 13 10/19/2022 1030   CREATININE 0.82 09/06/2023 1506      Component Value Date/Time   CALCIUM 9.7 09/06/2023 1506   ALKPHOS 75 09/06/2023 1506   AST 21 09/06/2023 1506   ALT 12 09/06/2023 1506   BILITOT 0.4 09/06/2023 1506       RADIOGRAPHIC STUDIES:  DG Chest Port 1 View  Result Date: 08/30/2023 CLINICAL DATA:  Status post bronchoscopy EXAM: PORTABLE CHEST 1 VIEW COMPARISON:  Chest CT 06/25/2023.  Chest x-ray 06/25/2023. FINDINGS: There some new strandy and patchy opacities in both lung bases. There is no pleural effusion or pneumothorax. The cardiomediastinal silhouette is stable and within normal limits. No acute fractures are seen. IMPRESSION: New strandy and patchy opacities in both lung bases. No pneumothorax. Electronically Signed   By: Darliss Cheney M.D.   On: 08/30/2023 16:20   DG C-ARM BRONCHOSCOPY  Result Date: 08/30/2023 C-ARM BRONCHOSCOPY: Fluoroscopy was utilized by the requesting physician.  No radiographic interpretation.     ASSESSMENT/PLAN:  This is a very pleasant 82 year old African-American female with stage IIIC (T3, N3, M0) non-small cell lung cancer, adenocarcinoma.  She had left supraclavicular nodal metastasis and 2 left lung nodules suspicious for bronchogenic carcinoma and/or metastasis.  There is also a short segment of wall thickening involving the splenic flexure of the colon with associated mild hypermetabolism which is nonspecific and colonoscopy was recommended. We have requested PDL1 and Foundation one testing  today.    The patient underwent ultrasound-guided aspiration in the left supraclavicular mass yielding 2 mL of fluid and cytology was negative.  The procedure was terminated and no core samples being obtained secondary to severe pain in the patient's left arm and hand.   She had biopsy under the care of Dr. Delton Coombes on 9/24 and the final pathology showed non-small cell lung cancer, adenocarcinoma.  Dr. Arbutus Ped had a lengthly discussion with the patient today about her current condition and treatment options.  Given this is stage IIIc disease, Dr. Arbutus Ped recommends concurrent chemoradiation with carboplatin for an AUC of 2 and paclitaxel 45 mg/m.  The patient is interested in this option and she is expected to start treatment on 09/19/2023.  The adverse side effects of treatment were discussed including but not limited to fatigue, nausea, vomiting, liver, kidney dysfunction, and myelosuppression.   I will arrange for the  patient to have a chemoeducation class prior to receiving her first cycle of chemotherapy.   I will place referral to radiation oncology.  We will also place referral to GI since the patient has not had a colonoscopy.  She would like to see Dr. Marca Ancona if possible.  She is a little reluctant to have a colonoscopy but understands this is the best way to assess the colon.  However, she would like to have this performed after she finishes her chemoradiation.   I sent prescriptions for Compazine 10 mg every 6 hours as needed for nausea.   She will continue to follow with Dr. Barbaraann Cao. He is planning on repeating MRI of the brain in 3 months.   The patient will follow-up in 3 weeks with cycle #2 of her treatment.  The patient's daughter was given the contact info to the [facility] department  The patient was advised to call immediately if she has any concerning symptoms in the interval. The patient voices understanding of current disease status and treatment options and is in agreement with  the current care plan. All questions were answered. The patient knows to call the clinic with any problems, questions or concerns. We can certainly see the patient much sooner if necessary     Orders Placed This Encounter  Procedures   Ambulatory referral to Radiation Oncology    Referral Priority:   Routine    Referral Type:   Consultation    Referral Reason:   Specialty Services Required    Requested Specialty:   Radiation Oncology    Number of Visits Requested:   1   Ambulatory referral to Gastroenterology    Referral Priority:   Routine    Referral Type:   Consultation    Referral Reason:   Specialty Services Required    Number of Visits Requested:   1      Braxten Memmer L Almina Schul, PA-C 09/06/23  ADDENDUM: Hematology/oncology Attending:  I had a face-to-face encounter with the patient today.  I reviewed her records, lab, scans as well as pathology report and recommended her care plan.  This is a very pleasant 82 years old African-American female recently diagnosed with a stage IIIc (T3, N3, M0) non-small cell lung cancer presented with 2 left lung nodule biopsy confirmed to be adenocarcinoma in addition to left supraclavicular lymphadenopathy.  The patient had a recent bronchoscopy under the care of Dr. Delton Coombes that confirmed the diagnosis of adenocarcinoma of the lung. I had a lengthy discussion with the patient and her family today about her current disease stage, prognosis and treatment options. I explained to the patient that she is not a good surgical candidate for resection because of her N3 disease in the left supraclavicular lymph nodes. I explained to the patient that she is a potentially curable and controllable disease. I recommended for her treatment regimen consisting of a course of concurrent chemoradiation with weekly carboplatin for AUC of 2 and paclitaxel 45 Mg/M2 for 6-7 weeks followed by restaging workup and consideration of consolidation treatment with  immunotherapy with Imfinzi if she has no actionable EGFR or ALK mutation. We will send her tissue biopsy for molecular studies. I discussed with the patient the adverse effect of the chemotherapy including but not limited to alopecia, myelosuppression, nausea and vomiting, peripheral neuropathy, liver or renal dysfunction. The patient and her family are interested in proceeding with the treatment. We will refer her to radiation oncology. Will arrange for the patient to have a chemotherapy education class  before the first dose of her treatment. We will send a prescription of Compazine to her pharmacy.  Who may consider her for Port-A-Cath placement if she is agreeable. She is expected to start the first dose of this treatment on September 19, 2023. The patient will come back for follow-up visit at that time. She was advised to call immediately if she has any other concerning symptoms in the interval. The total time spent in the appointment was 55 minutes. Disclaimer: This note was dictated with voice recognition software. Similar sounding words can inadvertently be transcribed and may be missed upon review. Lajuana Matte, MD

## 2023-08-29 ENCOUNTER — Other Ambulatory Visit: Payer: Self-pay

## 2023-08-29 ENCOUNTER — Telehealth: Payer: Self-pay | Admitting: Internal Medicine

## 2023-08-29 ENCOUNTER — Encounter (HOSPITAL_COMMUNITY): Payer: Self-pay | Admitting: Pulmonary Disease

## 2023-08-29 NOTE — Telephone Encounter (Signed)
Called patient regarding October and November appointments, patient is notified of all upcoming appointments.

## 2023-08-30 ENCOUNTER — Encounter (HOSPITAL_COMMUNITY)
Admission: RE | Disposition: A | Discharge status: Home / Self Care | Payer: Self-pay | Source: Home / Self Care | Attending: Pulmonary Disease

## 2023-08-30 ENCOUNTER — Ambulatory Visit (HOSPITAL_COMMUNITY)
Admission: RE | Admit: 2023-08-30 | Discharge: 2023-08-30 | Disposition: A | Discharge status: Home / Self Care | Payer: Medicare Other | Attending: Pulmonary Disease | Admitting: Pulmonary Disease

## 2023-08-30 ENCOUNTER — Other Ambulatory Visit: Payer: Self-pay

## 2023-08-30 ENCOUNTER — Encounter (HOSPITAL_COMMUNITY): Payer: Self-pay | Admitting: Pulmonary Disease

## 2023-08-30 ENCOUNTER — Ambulatory Visit (HOSPITAL_COMMUNITY): Payer: Medicare Other

## 2023-08-30 ENCOUNTER — Ambulatory Visit (HOSPITAL_COMMUNITY): Payer: Self-pay

## 2023-08-30 ENCOUNTER — Ambulatory Visit (HOSPITAL_BASED_OUTPATIENT_CLINIC_OR_DEPARTMENT_OTHER): Payer: Medicare Other

## 2023-08-30 DIAGNOSIS — I252 Old myocardial infarction: Secondary | ICD-10-CM | POA: Diagnosis not present

## 2023-08-30 DIAGNOSIS — R590 Localized enlarged lymph nodes: Secondary | ICD-10-CM | POA: Diagnosis not present

## 2023-08-30 DIAGNOSIS — C3490 Malignant neoplasm of unspecified part of unspecified bronchus or lung: Secondary | ICD-10-CM | POA: Diagnosis not present

## 2023-08-30 DIAGNOSIS — C3432 Malignant neoplasm of lower lobe, left bronchus or lung: Secondary | ICD-10-CM | POA: Diagnosis not present

## 2023-08-30 DIAGNOSIS — Z87891 Personal history of nicotine dependence: Secondary | ICD-10-CM | POA: Diagnosis not present

## 2023-08-30 DIAGNOSIS — I10 Essential (primary) hypertension: Secondary | ICD-10-CM | POA: Diagnosis not present

## 2023-08-30 DIAGNOSIS — Z48813 Encounter for surgical aftercare following surgery on the respiratory system: Secondary | ICD-10-CM | POA: Diagnosis not present

## 2023-08-30 DIAGNOSIS — E7800 Pure hypercholesterolemia, unspecified: Secondary | ICD-10-CM | POA: Diagnosis not present

## 2023-08-30 DIAGNOSIS — I2510 Atherosclerotic heart disease of native coronary artery without angina pectoris: Secondary | ICD-10-CM

## 2023-08-30 DIAGNOSIS — R918 Other nonspecific abnormal finding of lung field: Secondary | ICD-10-CM | POA: Diagnosis not present

## 2023-08-30 DIAGNOSIS — R911 Solitary pulmonary nodule: Secondary | ICD-10-CM | POA: Diagnosis not present

## 2023-08-30 DIAGNOSIS — C3492 Malignant neoplasm of unspecified part of left bronchus or lung: Secondary | ICD-10-CM | POA: Diagnosis not present

## 2023-08-30 HISTORY — DX: Cardiac arrhythmia, unspecified: I49.9

## 2023-08-30 HISTORY — DX: Acute myocardial infarction, unspecified: I21.9

## 2023-08-30 HISTORY — DX: Unspecified osteoarthritis, unspecified site: M19.90

## 2023-08-30 HISTORY — PX: BRONCHIAL BIOPSY: SHX5109

## 2023-08-30 LAB — BASIC METABOLIC PANEL
Anion gap: 9 (ref 5–15)
BUN: 9 mg/dL (ref 8–23)
CO2: 22 mmol/L (ref 22–32)
Calcium: 9.3 mg/dL (ref 8.9–10.3)
Chloride: 106 mmol/L (ref 98–111)
Creatinine, Ser: 0.84 mg/dL (ref 0.44–1.00)
GFR, Estimated: >60 mL/min (ref >60)
Glucose, Bld: 127 mg/dL — ABNORMAL HIGH (ref 70–99)
Potassium: 3.2 mmol/L — ABNORMAL LOW (ref 3.5–5.1)
Sodium: 137 mmol/L (ref 135–145)

## 2023-08-30 LAB — CBC
HCT: 41.3 % (ref 36.0–46.0)
Hemoglobin: 13.7 g/dL (ref 12.0–15.0)
MCH: 29.2 pg (ref 26.0–34.0)
MCHC: 33.2 g/dL (ref 30.0–36.0)
MCV: 88.1 fL (ref 80.0–100.0)
Platelets: 229 10*3/uL (ref 150–400)
RBC: 4.69 MIL/uL (ref 3.87–5.11)
RDW: 13.7 % (ref 11.5–15.5)
WBC: 3.5 10*3/uL — ABNORMAL LOW (ref 4.0–10.5)
nRBC: 0 % (ref 0.0–0.2)

## 2023-08-30 SURGERY — BRONCHOSCOPY, WITH BIOPSY USING ELECTROMAGNETIC NAVIGATION
Anesthesia: General

## 2023-08-30 MED ORDER — CHLORHEXIDINE GLUCONATE 0.12 % MT SOLN
15.0000 mL | Freq: Once | OROMUCOSAL | Status: AC
  Administered 2023-08-30: 15 mL via OROMUCOSAL
  Filled 2023-08-30: qty 15

## 2023-08-30 MED ORDER — ACETAMINOPHEN 10 MG/ML IV SOLN: 1000.0000 mg | Freq: Once | INTRAVENOUS | Status: DC | PRN

## 2023-08-30 MED ORDER — PHENYLEPHRINE 80 MCG/ML (10ML) SYRINGE FOR IV PUSH (FOR BLOOD PRESSURE SUPPORT)
PREFILLED_SYRINGE | INTRAVENOUS | Status: DC | PRN
  Administered 2023-08-30: 80 ug via INTRAVENOUS

## 2023-08-30 MED ORDER — LACTATED RINGERS IV SOLN: INTRAVENOUS | Status: DC

## 2023-08-30 MED ORDER — DEXAMETHASONE SODIUM PHOSPHATE 10 MG/ML IJ SOLN
INTRAMUSCULAR | Status: DC | PRN
  Administered 2023-08-30: 10 mg via INTRAVENOUS

## 2023-08-30 MED ORDER — EPHEDRINE SULFATE-NACL 50-0.9 MG/10ML-% IV SOSY
PREFILLED_SYRINGE | INTRAVENOUS | Status: DC | PRN
  Administered 2023-08-30: 12.5 mg via INTRAVENOUS

## 2023-08-30 MED ORDER — FENTANYL CITRATE (PF) 100 MCG/2ML IJ SOLN
INTRAMUSCULAR | Status: AC
  Filled 2023-08-30: qty 2

## 2023-08-30 MED ORDER — LIDOCAINE 2% (20 MG/ML) 5 ML SYRINGE
INTRAMUSCULAR | Status: DC | PRN
  Administered 2023-08-30: 100 mg via INTRAVENOUS

## 2023-08-30 MED ORDER — SUGAMMADEX SODIUM 200 MG/2ML IV SOLN
INTRAVENOUS | Status: DC | PRN
  Administered 2023-08-30 (×2): 200 mg via INTRAVENOUS

## 2023-08-30 MED ORDER — SUCCINYLCHOLINE CHLORIDE 200 MG/10ML IV SOSY
PREFILLED_SYRINGE | INTRAVENOUS | Status: DC | PRN
  Administered 2023-08-30: 100 mg via INTRAVENOUS

## 2023-08-30 MED ORDER — DROPERIDOL 2.5 MG/ML IJ SOLN: 0.6250 mg | Freq: Once | INTRAMUSCULAR | Status: DC | PRN

## 2023-08-30 MED ORDER — ROCURONIUM BROMIDE 10 MG/ML (PF) SYRINGE
PREFILLED_SYRINGE | INTRAVENOUS | Status: DC | PRN
  Administered 2023-08-30: 30 mg via INTRAVENOUS

## 2023-08-30 MED ORDER — ONDANSETRON HCL 4 MG/2ML IJ SOLN
INTRAMUSCULAR | Status: DC | PRN
  Administered 2023-08-30: 4 mg via INTRAVENOUS

## 2023-08-30 MED ORDER — FENTANYL CITRATE (PF) 100 MCG/2ML IJ SOLN: 25.0000 ug | INTRAMUSCULAR | Status: DC | PRN

## 2023-08-30 MED ORDER — PROPOFOL 10 MG/ML IV BOLUS
INTRAVENOUS | Status: DC | PRN
  Administered 2023-08-30: 150 mg via INTRAVENOUS

## 2023-08-30 MED ORDER — FENTANYL CITRATE (PF) 250 MCG/5ML IJ SOLN
INTRAMUSCULAR | Status: DC | PRN
  Administered 2023-08-30: 100 ug via INTRAVENOUS

## 2023-08-30 NOTE — Transfer of Care (Signed)
Immediate Anesthesia Transfer of Care Note  Patient: Stephanie Newton  Procedure(s) Performed: ROBOTIC ASSISTED NAVIGATIONAL BRONCHOSCOPY BRONCHIAL BIOPSIES  Patient Location: PACU  Anesthesia Type:General  Level of Consciousness: awake and patient cooperative  Airway & Oxygen Therapy: Patient Spontanous Breathing and Patient connected to face mask oxygen  Post-op Assessment: Report given to RN and Post -op Vital signs reviewed and stable  Post vital signs: Reviewed and stable  Last Vitals:  Vitals Value Taken Time  BP 165/86 08/30/23 1355  Temp 36.3 C 08/30/23 1355  Pulse 84 08/30/23 1359  Resp 26 08/30/23 1359  SpO2 91 % 08/30/23 1359  Vitals shown include unfiled device data.  Last Pain:  Vitals:   08/30/23 1355  TempSrc: Temporal  PainSc: 0-No pain         Complications: No notable events documented.

## 2023-08-30 NOTE — Discharge Instructions (Signed)

## 2023-08-30 NOTE — Anesthesia Postprocedure Evaluation (Signed)
Anesthesia Post Note  Patient: DEASYA HEIS  Procedure(s) Performed: ROBOTIC ASSISTED NAVIGATIONAL BRONCHOSCOPY BRONCHIAL BIOPSIES     Patient location during evaluation: PACU Anesthesia Type: General Level of consciousness: awake and alert Pain management: pain level controlled Vital Signs Assessment: post-procedure vital signs reviewed and stable Respiratory status: spontaneous breathing, nonlabored ventilation, respiratory function stable and patient connected to nasal cannula oxygen Cardiovascular status: blood pressure returned to baseline and stable Postop Assessment: no apparent nausea or vomiting Anesthetic complications: no   No notable events documented.  Last Vitals:  Vitals:   08/30/23 1410 08/30/23 1420  BP: 137/80 (!) 145/74  Pulse: 77 75  Resp: (!) 24 19  Temp:    SpO2: 93% 95%    Last Pain:  Vitals:   08/30/23 1420  TempSrc:   PainSc: 0-No pain                 Gordon Nation

## 2023-08-30 NOTE — Interval H&P Note (Signed)
History and Physical Interval Note:  08/30/2023 11:14 AM  Stephanie Newton  has presented today for surgery, with the diagnosis of lung nodule.  The various methods of treatment have been discussed with the patient and family. After consideration of risks, benefits and other options for treatment, the patient has consented to  Procedure(s): ROBOTIC ASSISTED NAVIGATIONAL BRONCHOSCOPY (N/A) as a surgical intervention.  The patient's history has been reviewed, patient examined, no change in status, stable for surgery.  I have reviewed the patient's chart and labs.  Questions were answered to the patient's satisfaction.     Rachel Bo Tierrah Anastos

## 2023-08-30 NOTE — Op Note (Signed)
Video Bronchoscopy with Robotic Assisted Bronchoscopic Navigation   Date of Operation: 08/30/2023   Pre-op Diagnosis: Lung nodule   Post-op Diagnosis: Lung nodule   Surgeon: Josephine Igo, DO  Assistants: None   Anesthesia: General endotracheal anesthesia  Operation: Flexible video fiberoptic bronchoscopy with robotic assistance and biopsies.  Estimated Blood Loss: Minimal  Complications: None  Indications and History: Stephanie Newton is a 82 y.o. female with history of Left lower lobe lung nodule. The risks, benefits, complications, treatment options and expected outcomes were discussed with the patient.  The possibilities of pneumothorax, pneumonia, reaction to medication, pulmonary aspiration, perforation of a viscus, bleeding, failure to diagnose a condition and creating a complication requiring transfusion or operation were discussed with the patient who freely signed the consent.    Description of Procedure: The patient was seen in the Preoperative Area, was examined and was deemed appropriate to proceed.  The patient was taken to [facility] endoscopy room 3, identified as Stephanie Newton and the procedure verified as Flexible Video Fiberoptic Bronchoscopy.  A Time Out was held and the above information confirmed.   Prior to the date of the procedure a high-resolution CT scan of the chest was performed. Utilizing ION software program a virtual tracheobronchial tree was generated to allow the creation of distinct navigation pathways to the patient's parenchymal abnormalities. After being taken to the operating room general anesthesia was initiated and the patient  was orally intubated. The video fiberoptic bronchoscope was introduced via the endotracheal tube and a general inspection was performed which showed normal right and left lung anatomy, aspiration of the bilateral mainstems was completed to remove any remaining secretions. Robotic catheter inserted into patient's endotracheal  tube.   Target #1 Left Lower lobe lung nodule: The distinct navigation pathways prepared prior to this procedure were then utilized to navigate to patient's lesion identified on CT scan. The robotic catheter was secured into place and the vision probe was withdrawn.  Lesion location was approximated using fluoroscopy and 3D CBCT for CT guided needle placement for peripheral targeting. Under fluoroscopic guided transbronchial needle biopsies, and transbronchial forceps biopsies were performed to be sent for cytology and pathology.   At the end of the procedure a general airway inspection was performed and there was no evidence of active bleeding. The bronchoscope was removed.  The patient tolerated the procedure well. There was no significant blood loss and there were no obvious complications. A post-procedural chest x-ray is pending.  Samples Target #1: 1. Transbronchial Wang needle biopsies from LLL 2. Transbronchial forceps biopsies from LLL  Plans:  The patient will be discharged from the PACU to home when recovered from anesthesia and after chest x-ray is reviewed. We will review the cytology, pathology results with the patient when they become available. Outpatient followup will be with Josephine Igo, DO.  Josephine Igo, DO [facility] 08/30/2023 1:48 PM

## 2023-08-30 NOTE — Anesthesia Preprocedure Evaluation (Signed)
Anesthesia Evaluation  Patient identified by MRN, date of birth, ID band Patient awake    Reviewed: Allergy & Precautions, H&P , NPO status , Patient's Chart, lab work & pertinent test results  Airway Mallampati: II  TM Distance: >3 FB Neck ROM: Full    Dental no notable dental hx.    Pulmonary former smoker Lung nodule   Pulmonary exam normal breath sounds clear to auscultation       Cardiovascular hypertension, + CAD and + Past MI  Normal cardiovascular exam+ dysrhythmias  Rhythm:Regular Rate:Normal     Neuro/Psych Brain mass   negative psych ROS   GI/Hepatic negative GI ROS, Neg liver ROS,,,  Endo/Other  negative endocrine ROS    Renal/GU negative Renal ROS  negative genitourinary   Musculoskeletal  (+) Arthritis ,    Abdominal   Peds negative pediatric ROS (+)  Hematology negative hematology ROS (+)   Anesthesia Other Findings   Reproductive/Obstetrics negative OB ROS                             Anesthesia Physical Anesthesia Plan  ASA: 3  Anesthesia Plan: General   Post-op Pain Management:    Induction: Intravenous  PONV Risk Score and Plan: Ondansetron and Dexamethasone  Airway Management Planned: Oral ETT  Additional Equipment:   Intra-op Plan:   Post-operative Plan: Extubation in OR  Informed Consent: I have reviewed the patients History and Physical, chart, labs and discussed the procedure including the risks, benefits and alternatives for the proposed anesthesia with the patient or authorized representative who has indicated his/her understanding and acceptance.     Dental advisory given  Plan Discussed with: CRNA  Anesthesia Plan Comments:        Anesthesia Quick Evaluation

## 2023-08-31 LAB — CYTOLOGY - NON PAP

## 2023-09-02 ENCOUNTER — Ambulatory Visit (INDEPENDENT_AMBULATORY_CARE_PROVIDER_SITE_OTHER): Payer: Medicare Other | Admitting: Acute Care

## 2023-09-02 ENCOUNTER — Encounter: Payer: Self-pay | Admitting: Acute Care

## 2023-09-02 VITALS — BP 122/68 | HR 59 | Temp 97.9°F | Ht 65.0 in | Wt 172.0 lb

## 2023-09-02 DIAGNOSIS — C3432 Malignant neoplasm of lower lobe, left bronchus or lung: Secondary | ICD-10-CM | POA: Diagnosis not present

## 2023-09-02 DIAGNOSIS — J069 Acute upper respiratory infection, unspecified: Secondary | ICD-10-CM | POA: Diagnosis not present

## 2023-09-02 DIAGNOSIS — Z87891 Personal history of nicotine dependence: Secondary | ICD-10-CM | POA: Diagnosis not present

## 2023-09-02 DIAGNOSIS — Z9889 Other specified postprocedural states: Secondary | ICD-10-CM | POA: Diagnosis not present

## 2023-09-02 MED ORDER — AZITHROMYCIN 250 MG PO TABS
ORAL_TABLET | ORAL | 0 refills | Status: DC
Start: 2023-09-02 — End: 2024-02-13

## 2023-09-02 NOTE — Progress Notes (Signed)
History of Present Illness Stephanie Newton is a 82 y.o. female past medical history of hypercholesterolemia, hypertension, history of former smoker. Patient was referred and seen by Dr. Tonia Brooms  after having imaging concerning for a left upper lobe malignancy.   Synopsis 82 year old female, past medical history of hypercholesterolemia, hypertension, history of former smoker.  Patient was referred after having imaging concerning for a left upper lobe malignancy.  She has a small nodule as well as associated supraclavicular adenopathy.  They biopsied the lymph node which was nondiagnostic.  She also had MRI imaging of the head which was concern for a potential dural metastasis versus meningioma.  She has seen Dr. Barbaraann Cao from neuro-oncology and has already established care with medical oncology with Dr. Shirline Frees.  Recommending was to have a robotic assisted navigational bronchoscopy for tissue sampling of the left upper lobe nodule confirmed malignancy. Pt, underwent robotic assisted navigational bronchoscopy with biopsies on 08/30/2023. She is here today to review cytology and ensure she has done well after the procedure.    09/02/2023 Pt. Presents for follow up. She states she has been doing well after her biopsy, but did have some days after the procedure when she did not feel well. . She still has a sore throat , very sore mouth, she initially had a very runny nose the day of the procedure that was severe, but was  better after about 4 hours. She took antihistamines, which eventually helped. She did have a sore throat, which she still has but this is getting  better. Her mouth was also very sore, this is improving  She denied any bleeding, and she did not have a cough. No fever.   We have reviewed her cytology results. They were positive for adenocarcinoma in the left lower lobe. We discussed that this is a non small cell lung cancer. She is already established with Dr. Arbutus Ped, and has an appointment  with him 09/06/2023.   Test Results: Cytology 08/30/2023 A. LUNG, LLL FORCEPS BIOPSIES:  - Adenocarcinoma  - See comment   COMMENT:  There is likely sufficient cellularity in the cellblock for molecular  testing.      Latest Ref Rng & Units 08/30/2023   10:38 AM 07/27/2023   11:30 AM 06/29/2023   10:08 AM  CBC  WBC 4.0 - 10.5 K/uL 3.5  3.6  5.1   Hemoglobin 12.0 - 15.0 g/dL 95.2  84.1  32.4   Hematocrit 36.0 - 46.0 % 41.3  40.4  41.5   Platelets 150 - 400 K/uL 229  214  250        Latest Ref Rng & Units 08/30/2023   10:38 AM 07/27/2023   11:30 AM 06/29/2023   10:08 AM  BMP  Glucose 70 - 99 mg/dL 401  027  253   BUN 8 - 23 mg/dL 9  12  9    Creatinine 0.44 - 1.00 mg/dL 6.64  4.03  4.74   Sodium 135 - 145 mmol/L 137  137  139   Potassium 3.5 - 5.1 mmol/L 3.2  3.4  3.5   Chloride 98 - 111 mmol/L 106  102  104   CO2 22 - 32 mmol/L 22  26  28    Calcium 8.9 - 10.3 mg/dL 9.3  9.6  25.9     BNP    Component Value Date/Time   BNP 55.0 06/12/2022 2117    ProBNP No results found for: "PROBNP"  PFT No results found for: "FEV1PRE", "FEV1POST", "FVCPRE", "  FVCPOST", "TLC", "DLCOUNC", "PREFEV1FVCRT", "PSTFEV1FVCRT"  DG Chest Port 1 View  Result Date: 08/30/2023 CLINICAL DATA:  Status post bronchoscopy EXAM: PORTABLE CHEST 1 VIEW COMPARISON:  Chest CT 06/25/2023.  Chest x-ray 06/25/2023. FINDINGS: There some new strandy and patchy opacities in both lung bases. There is no pleural effusion or pneumothorax. The cardiomediastinal silhouette is stable and within normal limits. No acute fractures are seen. IMPRESSION: New strandy and patchy opacities in both lung bases. No pneumothorax. Electronically Signed   By: Darliss Cheney M.D.   On: 08/30/2023 16:20   DG C-ARM BRONCHOSCOPY  Result Date: 08/30/2023 C-ARM BRONCHOSCOPY: Fluoroscopy was utilized by the requesting physician.  No radiographic interpretation.     Past medical hx Past Medical History:  Diagnosis Date   Arthritis     Dysrhythmia    Hypercholesteremia    Hypertension    Myocardial infarction [facility])      Social History   Tobacco Use   Smoking status: Former    Types: Cigarettes   Smokeless tobacco: Never   Tobacco comments:    6 cigarettes a day  Vaping Use   Vaping status: Never Used  Substance Use Topics   Alcohol use: Yes    Comment: occasionally    Drug use: No    Ms.Leon reports that she has quit smoking. Her smoking use included cigarettes. She has never used smokeless tobacco. She reports current alcohol use. She reports that she does not use drugs.  Tobacco Cessation: Former smoker , 30 pack year smoking history   Past surgical hx, Family hx, Social hx all reviewed.  Current Outpatient Medications on File Prior to Visit  Medication Sig   amLODipine (NORVASC) 2.5 MG tablet Take 2.5 mg by mouth daily.   aspirin EC 81 MG tablet Take 1 tablet by mouth daily.   estradiol (ESTRACE) 1 MG tablet Take 1 mg by mouth daily.   famotidine (PEPCID) 20 MG tablet Take 20 mg by mouth as needed.   hydrochlorothiazide (HYDRODIURIL) 12.5 MG tablet Take 12.5 mg by mouth daily.    methocarbamol (ROBAXIN) 500 MG tablet Take 1 tablet (500 mg total) by mouth every 6 (six) hours as needed for muscle spasms.   metoprolol succinate (TOPROL-XL) 100 MG 24 hr tablet Take 100 mg by mouth daily.   metoprolol succinate (TOPROL-XL) 50 MG 24 hr tablet Take 1 tablet (50 mg total) by mouth daily.   oxyCODONE-acetaminophen (PERCOCET/ROXICET) 5-325 MG tablet Take 1-2 tablets by mouth every 8 (eight) hours as needed for severe pain.   simvastatin (ZOCOR) 20 MG tablet Take 20 mg by mouth at bedtime.   No current facility-administered medications on file prior to visit.     Allergies  Allergen Reactions   Shellfish Allergy Nausea Only    Review Of Systems:  Constitutional:   No  weight loss, night sweats,  Fevers, chills, fatigue, or  lassitude.  HEENT:   No headaches,  Difficulty swallowing,  Tooth/dental  problems, or  + Sore throat,                No sneezing, itching, ear ache, nasal congestion, post nasal drip,   CV:  No chest pain,  Orthopnea, PND, swelling in lower extremities, anasarca, dizziness, palpitations, syncope.   GI  No heartburn, indigestion, abdominal pain, nausea, vomiting, diarrhea, change in bowel habits, loss of appetite, bloody stools.   Resp: No shortness of breath with exertion or at rest.  No excess mucus, no productive cough,  No non-productive cough,  No coughing up of blood.  No change in color of mucus.  No wheezing.  No chest wall deformity  Skin: no rash or lesions.  GU: no dysuria, change in color of urine, no urgency or frequency.  No flank pain, no hematuria   MS:  No joint pain or swelling.  No decreased range of motion.  No back pain. + rib tenderness after procedure.  Psych:  No change in mood or affect. No depression or anxiety.  No memory loss.   Vital Signs BP 122/68 (BP Location: Left Arm, Cuff Size: Normal)   Pulse (!) 59   Temp 97.9 F (36.6 C) (Oral)   Ht 5\' 5"  (1.651 m)   Wt 172 lb (78 kg)   SpO2 99% Comment: on RA  BMI 28.62 kg/m    Physical Exam:  General- No distress,  A&Ox3, pleasant ENT: No sinus tenderness, TM clear, pale nasal mucosa, no oral exudate,no post nasal drip, no LAN Cardiac: S1, S2, regular rate and rhythm, no murmur Chest: No wheeze/ rales/ dullness; no accessory muscle use, no nasal flaring, no sternal retractions Abd.: Soft Non-tender, ND, BS +, Body mass index is 28.62 kg/m.  Ext: No clubbing cyanosis, edema Neuro:  normal strength, MAE x 4, A&O x 3 Skin: No rashes, warm and dry, no lesions  Psych: normal mood and behavior   Assessment/Plan New diagnosis adenocarcinoma of the lung Post robotic assisted navigational bronchoscopy with biopsies Former smoker  URI Plan The biopsy results for the left lower lobe does show adenocarcinoma.  This is a small cell lung cancer. Follow up with Dr. Arbutus Ped as is  scheduled 09/06/2023.  We will send in a prescription for a z pack. This is an antibiotic . Take 2 tablets today, then one tablet daily for the next 4 days. Please let us know if you continue to have any problems.  Use ibuprofen and Tylenol as needed for rib pain. Delsym for cough if needed.  Call if you need [place] for anything. Please contact office for sooner follow up if symptoms do not improve or worsen or seek emergency care    I spent 20 minutes dedicated to the care of this patient on the date of this encounter to include pre-visit review of records, face-to-face time with the patient discussing conditions above, post visit ordering of testing, clinical documentation with the electronic health record, making appropriate referrals as documented, and communicating necessary information to the patient's healthcare team.      Bevelyn Ngo, NP 09/02/2023  12:16 PM

## 2023-09-02 NOTE — Patient Instructions (Addendum)
It is good to see you today. The biopsy results for the left lower lobe does show adenocarcinoma.  This is a small cell lung cancer. Follow up with Dr. Arbutus Ped as is scheduled 09/06/2023.  We will send in a prescription for a z pack. This is an antibiotic . Take 2 tablets today, then one tablet daily for the next 4 days. Please let us know if you continue to have any problems.  Use ibuprofen and Tylenol as needed for rib pain. Delsym for cough if needed.  Call if you need [place] for anything. Please contact office for sooner follow up if symptoms do not improve or worsen or seek emergency care

## 2023-09-06 ENCOUNTER — Inpatient Hospital Stay: Payer: Medicare Other

## 2023-09-06 ENCOUNTER — Telehealth: Payer: Self-pay

## 2023-09-06 ENCOUNTER — Encounter: Payer: Self-pay | Admitting: Internal Medicine

## 2023-09-06 ENCOUNTER — Inpatient Hospital Stay (HOSPITAL_BASED_OUTPATIENT_CLINIC_OR_DEPARTMENT_OTHER): Payer: Medicare Other | Admitting: Physician Assistant

## 2023-09-06 VITALS — BP 136/87 | HR 67 | Temp 98.2°F | Resp 13 | Wt 171.0 lb

## 2023-09-06 DIAGNOSIS — M79602 Pain in left arm: Secondary | ICD-10-CM | POA: Insufficient documentation

## 2023-09-06 DIAGNOSIS — C3492 Malignant neoplasm of unspecified part of left bronchus or lung: Secondary | ICD-10-CM | POA: Insufficient documentation

## 2023-09-06 DIAGNOSIS — I252 Old myocardial infarction: Secondary | ICD-10-CM | POA: Insufficient documentation

## 2023-09-06 DIAGNOSIS — Z90710 Acquired absence of both cervix and uterus: Secondary | ICD-10-CM | POA: Insufficient documentation

## 2023-09-06 DIAGNOSIS — R29818 Other symptoms and signs involving the nervous system: Secondary | ICD-10-CM | POA: Insufficient documentation

## 2023-09-06 DIAGNOSIS — Z7189 Other specified counseling: Secondary | ICD-10-CM | POA: Insufficient documentation

## 2023-09-06 DIAGNOSIS — Z5111 Encounter for antineoplastic chemotherapy: Secondary | ICD-10-CM | POA: Insufficient documentation

## 2023-09-06 DIAGNOSIS — Z79899 Other long term (current) drug therapy: Secondary | ICD-10-CM | POA: Insufficient documentation

## 2023-09-06 DIAGNOSIS — C3482 Malignant neoplasm of overlapping sites of left bronchus and lung: Secondary | ICD-10-CM | POA: Insufficient documentation

## 2023-09-06 DIAGNOSIS — Z79630 Long term (current) use of alkylating agent: Secondary | ICD-10-CM | POA: Insufficient documentation

## 2023-09-06 DIAGNOSIS — R948 Abnormal results of function studies of other organs and systems: Secondary | ICD-10-CM

## 2023-09-06 DIAGNOSIS — Z79633 Long term (current) use of mitotic inhibitor: Secondary | ICD-10-CM | POA: Insufficient documentation

## 2023-09-06 DIAGNOSIS — Z9049 Acquired absence of other specified parts of digestive tract: Secondary | ICD-10-CM | POA: Insufficient documentation

## 2023-09-06 DIAGNOSIS — R911 Solitary pulmonary nodule: Secondary | ICD-10-CM

## 2023-09-06 DIAGNOSIS — C3412 Malignant neoplasm of upper lobe, left bronchus or lung: Secondary | ICD-10-CM | POA: Insufficient documentation

## 2023-09-06 DIAGNOSIS — C3490 Malignant neoplasm of unspecified part of unspecified bronchus or lung: Secondary | ICD-10-CM | POA: Insufficient documentation

## 2023-09-06 DIAGNOSIS — Z510 Encounter for antineoplastic radiation therapy: Secondary | ICD-10-CM | POA: Insufficient documentation

## 2023-09-06 DIAGNOSIS — R222 Localized swelling, mass and lump, trunk: Secondary | ICD-10-CM

## 2023-09-06 LAB — CBC WITH DIFFERENTIAL (CANCER CENTER ONLY)
Abs Immature Granulocytes: 0.01 10*3/uL (ref 0.00–0.07)
Basophils Absolute: 0 10*3/uL (ref 0.0–0.1)
Basophils Relative: 1 %
Eosinophils Absolute: 0.1 10*3/uL (ref 0.0–0.5)
Eosinophils Relative: 3 %
HCT: 40.5 % (ref 36.0–46.0)
Hemoglobin: 13.6 g/dL (ref 12.0–15.0)
Immature Granulocytes: 0 %
Lymphocytes Relative: 33 %
Lymphs Abs: 1.3 10*3/uL (ref 0.7–4.0)
MCH: 29.7 pg (ref 26.0–34.0)
MCHC: 33.6 g/dL (ref 30.0–36.0)
MCV: 88.4 fL (ref 80.0–100.0)
Monocytes Absolute: 0.4 10*3/uL (ref 0.1–1.0)
Monocytes Relative: 10 %
Neutro Abs: 2.1 10*3/uL (ref 1.7–7.7)
Neutrophils Relative %: 53 %
Platelet Count: 251 10*3/uL (ref 150–400)
RBC: 4.58 MIL/uL (ref 3.87–5.11)
RDW: 13.7 % (ref 11.5–15.5)
WBC Count: 3.9 10*3/uL — ABNORMAL LOW (ref 4.0–10.5)
nRBC: 0 % (ref 0.0–0.2)

## 2023-09-06 LAB — CMP (CANCER CENTER ONLY)
ALT: 12 U/L (ref 0–44)
AST: 21 U/L (ref 15–41)
Albumin: 3.9 g/dL (ref 3.5–5.0)
Alkaline Phosphatase: 75 U/L (ref 38–126)
Anion gap: 7 (ref 5–15)
BUN: 13 mg/dL (ref 8–23)
CO2: 28 mmol/L (ref 22–32)
Calcium: 9.7 mg/dL (ref 8.9–10.3)
Chloride: 104 mmol/L (ref 98–111)
Creatinine: 0.82 mg/dL (ref 0.44–1.00)
GFR, Estimated: >60 mL/min (ref >60)
Glucose, Bld: 101 mg/dL — ABNORMAL HIGH (ref 70–99)
Potassium: 3.7 mmol/L (ref 3.5–5.1)
Sodium: 139 mmol/L (ref 135–145)
Total Bilirubin: 0.4 mg/dL (ref 0.3–1.2)
Total Protein: 6.7 g/dL (ref 6.5–8.1)

## 2023-09-06 MED ORDER — PROCHLORPERAZINE MALEATE 10 MG PO TABS
10.0000 mg | ORAL_TABLET | Freq: Four times a day (QID) | ORAL | 2 refills | Status: AC | PRN
Start: 2023-09-06 — End: ?

## 2023-09-06 NOTE — Progress Notes (Signed)
START OFF PATHWAY REGIMEN - Non-Small Cell Lung   [id number]:Carboplatin AUC=2 IV D1 + Paclitaxel 45 mg/m2 IV D1 q7 Days + RT:   A cycle is every 7 days, concurrent with RT:     Paclitaxel      Carboplatin   **Always confirm dose/schedule in your pharmacy ordering system**  Patient Characteristics: Preoperative or Nonsurgical Candidate (Clinical Staging), Stage III - Nonsurgical Candidate (Nonsquamous and Squamous), PS = 0,1, EGFR Mutation - Common (Exon 19 Deletion or Exon 21 L858R Substitution) Status Unknown Therapeutic Status: Preoperative or Nonsurgical Candidate (Clinical Staging) AJCC T Category: cT3 AJCC N Category: cN3 AJCC M Category: cM0 AJCC 8 Stage Grouping: IIIC ECOG Performance Status: 1 EGFR Mutation - Common (Exon 19 Deletion or Exon 21 L858R Substitution): Awaiting Test Results Intent of Therapy: Curative Intent, Discussed with Patient

## 2023-09-06 NOTE — Telephone Encounter (Signed)
In error

## 2023-09-06 NOTE — Patient Instructions (Addendum)
-  There are two main categories of lung cancer, they are named based on the size of the cancer cell. One is called Non-Small cell lung cancer. The other type is Small Cell Lung Cancer -Given that  -The sample (biopsy) that they took of your tumor was consistent with a subtype of Non-small cell lung cancer called Adenocarcinoma  -We covered a lot of important information at your appointment today regarding what the treatment plan is moving forward. Here are the the main points that were discussed at your office visit with [place] today:  -We will request some extra tests which does not change treatment now but it is helpful for [place] to know what other options you are a candidate for in the future if needed. We keep this information in our back pocket if needed, it mostly would affect if we did immunotherapy after the chemo/radiation.  -The treatment that you will receive consists of two chemotherapy drugs called Carboplatin and Paclitaxel (also called Taxol) -We are planning on starting your treatment next week on 09/19/23 but before your start your treatment, I would like you to attend a Chemotherapy Education Class. This involves having you sit down with one of our nurse educators. She will discuss with you one-on-one more details about your treatment as well as general information about resources here at the [facility].  -Your treatment will be given every week for about 6 weeks or so (as long as you are also receiving radiation). We will check your labs (blood work) once a week . Dr. Arbutus Ped or I will see you every other treatment just to make sure that you are doing well and that everything is on track. -We will get a CT scan about 3 weeks after you complete your treatment  Medications:  -Compazine was sent to your pharmacy. This medication is for nausea. You may take this every 6 hours as needed if you feel nausous.   Referrals -I will place the referral to radiation oncology to meet with you to  discuss starting radiation treatment. Please be on the lookout for a phone call from them to schedule a consultation.  -Just because the PET scan showed some activity in the colon, we may refer you to GI to see if they would recommend repeat colonoscopy just to make sure no concerns there.   Follow Up:  -We will see you back for a follow up visit in 3 weeks when you come in for your second week of treatment.

## 2023-09-07 ENCOUNTER — Other Ambulatory Visit: Payer: Self-pay

## 2023-09-07 ENCOUNTER — Telehealth: Payer: Self-pay | Admitting: Internal Medicine

## 2023-09-07 NOTE — Telephone Encounter (Signed)
Scheduled per 10/01 los and work-queue, patient has been called and notified of upcoming appointments.

## 2023-09-08 ENCOUNTER — Other Ambulatory Visit: Payer: Self-pay

## 2023-09-08 NOTE — Progress Notes (Signed)
Thoracic Location of Tumor / Histology: Left Lower Lobe Lung  Patient presented to the ER in July 2024 with complaints of dizziness and left shoulder pain.  Imaging obtained and showed left supraclavicular mass with central low density suspicious for necrotic lymph node and sold left lower lobe pulmonary nodules.  Bronchoscopy 08/30/2023  MRI Brain 07/08/2023: 10 x 8 x 14 mm enhancing dural-based lesion extends inferiorly and medially from the left side of the tentorium with minimal mass effect on the left midbrain and pons. This likely represents a meningioma.  PET 07/06/2023: Two left lung nodules suspicious for primary lung cancer and/or metastasis and hypermetabolic left supraclavicular nodal metastases.   Biopsies of LLL Lung 08/30/2023   Left Supraclavicular Mass 07/27/2023    Past/Anticipated interventions by cardiothoracic surgery, if any:    Past/Anticipated interventions by medical oncology, if any:  Cassie Heillingoetter PA / Dr. Arbutus Ped 09/06/2023 -Given this is stage IIIc disease, Dr. Arbutus Ped recommends concurrent chemoradiation.  -The patient is interested in this option and she is expected to start treatment on 09/19/2023.    Tobacco/Marijuana/Snuff/ETOH use: Former Smoker  Signs/Symptoms Weight changes, if any: Denies weight loss over the past month. Respiratory complaints, if any: Denies changes. Hemoptysis, if any: Denies cough. Pain issues, if any:  Denies chest pain, pressure, or tightness.  SAFETY ISSUES: Prior radiation? No Pacemaker/ICD?  No Possible current pregnancy? Hysterectomy Is the patient on methotrexate? No  Current Complaints / other details:

## 2023-09-09 ENCOUNTER — Encounter: Payer: Self-pay | Admitting: Internal Medicine

## 2023-09-09 ENCOUNTER — Telehealth: Payer: Self-pay | Admitting: Internal Medicine

## 2023-09-09 ENCOUNTER — Ambulatory Visit
Admission: RE | Admit: 2023-09-09 | Discharge: 2023-09-09 | Disposition: A | Discharge status: Home / Self Care | Payer: Medicare Other | Source: Ambulatory Visit | Attending: Radiation Oncology | Admitting: Radiation Oncology

## 2023-09-09 ENCOUNTER — Encounter: Payer: Self-pay | Admitting: Radiation Oncology

## 2023-09-09 ENCOUNTER — Ambulatory Visit
Admission: RE | Admit: 2023-09-09 | Discharge: 2023-09-09 | Disposition: A | Discharge status: Home / Self Care | Payer: Medicare Other | Source: Ambulatory Visit | Attending: Radiation Oncology

## 2023-09-09 VITALS — BP 126/69 | HR 65 | Temp 97.8°F | Resp 20 | Ht 65.0 in | Wt 174.8 lb

## 2023-09-09 DIAGNOSIS — Z79899 Other long term (current) drug therapy: Secondary | ICD-10-CM | POA: Insufficient documentation

## 2023-09-09 DIAGNOSIS — I10 Essential (primary) hypertension: Secondary | ICD-10-CM | POA: Insufficient documentation

## 2023-09-09 DIAGNOSIS — Z7982 Long term (current) use of aspirin: Secondary | ICD-10-CM | POA: Insufficient documentation

## 2023-09-09 DIAGNOSIS — C3482 Malignant neoplasm of overlapping sites of left bronchus and lung: Secondary | ICD-10-CM

## 2023-09-09 DIAGNOSIS — I252 Old myocardial infarction: Secondary | ICD-10-CM | POA: Diagnosis not present

## 2023-09-09 DIAGNOSIS — M129 Arthropathy, unspecified: Secondary | ICD-10-CM | POA: Diagnosis not present

## 2023-09-09 DIAGNOSIS — E7800 Pure hypercholesterolemia, unspecified: Secondary | ICD-10-CM | POA: Insufficient documentation

## 2023-09-09 DIAGNOSIS — Z87891 Personal history of nicotine dependence: Secondary | ICD-10-CM | POA: Insufficient documentation

## 2023-09-09 NOTE — Telephone Encounter (Signed)
Scheduled per 10/01 los, patient has been called and voicemail was left regarding upcoming appointments.

## 2023-09-09 NOTE — Progress Notes (Signed)
The proposed treatment discussed in conference is for discussion purpose only and is not a binding recommendation.  The patients have not been physically examined, or presented with their treatment options.  Therefore, final treatment plans cannot be decided.  

## 2023-09-09 NOTE — Telephone Encounter (Signed)
Scheduled per 10/01 los, patient has been called and voicemail was left.

## 2023-09-11 NOTE — Progress Notes (Signed)
Radiation Oncology         [phone] ________________________________  Name: Stephanie Newton        MRN: [id number]  Date of Service: 09/09/2023 DOB: Sep 12, 1941  DG:[id number], Sherie Don, MD  Si Gaul, MD     REFERRING PHYSICIAN: Si Gaul, MD   DIAGNOSIS: Stage IIIC (T3, N3, M0). She presented with supraclavicular lymphadenopathy, 2 left lung nodules suspicious for bronchogenic carcinoma and/or metastasis  The encounter diagnosis was Malignant neoplasm of overlapping sites of left lung (HCC).   HISTORY OF PRESENT ILLNESS: Stephanie Newton is a 82 y.o. female accompanied by her supportive husband seen at the request of Dr. Arbutus Ped for a left lung cancer. She originally presented to the ER on 06/25/2023 for dizziness and left shoulder pain. Follow-up imaging showed a left supraclavicular mass with a central low-density suspicious for a necrotic lymph node and solid left lower lobe pulmonary nodules.  Further work-up included a PET scan and brain MRI. The PET scan showed previously visualized two left lung nodules and hypermetabolic left supraclavicular nodal metastases. This also showed non-specific mild hypermetabolism in the splenic flexure. MRI of the brain showed a 14 mm enhancing dural-based lesion extending inferiorly and medially from the left side of the tentorium with minimal mass effect on the left midbrain and pons, likely representing a meningioma. She had an ultrasound-guided core needle biopsy of the left supraclavicular lymph node, which yielded 2 mL of fluid and cytology was negative. No core samples were able to be obtained due to severe pain the patient experienced during the procedure. 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   Patient saw Cassandra Heilingoetter PA-C and Dr. Arbutus Ped on 08/02/23. Dr. Barbaraann Cao on 08/02/23 to discuss incidental meningoma found on staging workup. He  recommended MRI in 3 months to confirm benign etiology.   Patient proceeded with a bronchoscopy and biopsy on 08/30/23 under the care of Dr. Delton Coombes. Final pathology revealed non-small cell ung cancer, adenocarcinoma.   Patient returned to [facility] and Dr. Arbutus Ped on 09/06/23. After discussion of the pathology results, they decided to send her tissue biopsy for PDL1 and Foundation one testing. They also recommended concurrent chemoradiation. She is scheduled for her first chemotherapy infusion on 09/19/23.   She is going to wait until she finishes treatment prior to colonoscopy to evaluate PET findings.   PREVIOUS RADIATION THERAPY: No   PAST MEDICAL HISTORY:  Past Medical History:  Diagnosis Date   Arthritis    Dysrhythmia    Hypercholesteremia    Hypertension    Myocardial infarction [facility])        PAST SURGICAL HISTORY: Past Surgical History:  Procedure Laterality Date   BREAST EXCISIONAL BIOPSY Right    x2   BRONCHIAL BIOPSY  08/30/2023   Procedure: BRONCHIAL BIOPSIES;  Surgeon: Josephine Igo, DO;  Location: [facility] ENDOSCOPY;  Service: Pulmonary;;   EYE SURGERY     cataract bilateral    HERNIA REPAIR  06/2000   INCISIONAL VENTRAL ; DR Carman Ching    KNEE ARTHROSCOPY Left 08/23/2017   dr Thomasena Edis    LAPAROSCOPIC CHOLECYSTECTOMY  06/13/2000   DR DOUG [facility]    LEFT HEART CATH AND CORONARY ANGIOGRAPHY N/A 12/04/2018   Procedure: LEFT HEART CATH AND CORONARY ANGIOGRAPHY;  Surgeon: Lyn Records, MD;  Location: [facility] INVASIVE CV LAB;  Service: Cardiovascular;  Laterality: N/A;   TOTAL KNEE ARTHROPLASTY Left 03/31/2018   Procedure: LEFT TOTAL  KNEE ARTHROPLASTY;  Surgeon: Eugenia Mcalpine, MD;  Location: [facility] ORS;  Service: Orthopedics;  Laterality: Left;  with block   TOTAL KNEE ARTHROPLASTY Right 04/30/2020   Procedure: RIGHT TOTAL KNEE ARTHROPLASTY;  Surgeon: Ollen Gross, MD;  Location: [facility] ORS;  Service: Orthopedics;  Laterality: Right;    VAGINAL  HYSTERECTOMY  age 40s     FAMILY HISTORY:  Family History  Problem Relation Age of Onset   Heart attack Mother    Pneumonia Brother      SOCIAL HISTORY:  reports that she has quit smoking. Her smoking use included cigarettes. She has never used smokeless tobacco. She reports current alcohol use. She reports that she does not use drugs.   ALLERGIES: Shellfish allergy   MEDICATIONS:  Current Outpatient Medications  Medication Sig Dispense Refill   amLODipine (NORVASC) 2.5 MG tablet Take 2.5 mg by mouth daily.     aspirin EC 81 MG tablet Take 1 tablet by mouth daily.     azithromycin (ZITHROMAX) 250 MG tablet Take 2 tablets today, then one tablet each day for the next 6 days. 6 tablet 0   estradiol (ESTRACE) 1 MG tablet Take 1 mg by mouth daily.     famotidine (PEPCID) 20 MG tablet Take 20 mg by mouth as needed.     hydrochlorothiazide (HYDRODIURIL) 12.5 MG tablet Take 12.5 mg by mouth daily.      methocarbamol (ROBAXIN) 500 MG tablet Take 1 tablet (500 mg total) by mouth every 6 (six) hours as needed for muscle spasms. 40 tablet 0   metoprolol succinate (TOPROL-XL) 100 MG 24 hr tablet Take 100 mg by mouth daily.     metoprolol succinate (TOPROL-XL) 50 MG 24 hr tablet Take 1 tablet (50 mg total) by mouth daily. 90 tablet 1   oxyCODONE-acetaminophen (PERCOCET/ROXICET) 5-325 MG tablet Take 1-2 tablets by mouth every 8 (eight) hours as needed for severe pain. 6 tablet 0   prochlorperazine (COMPAZINE) 10 MG tablet Take 1 tablet (10 mg total) by mouth every 6 (six) hours as needed. 30 tablet 2   simvastatin (ZOCOR) 20 MG tablet Take 20 mg by mouth at bedtime.     No current facility-administered medications for this encounter.     REVIEW OF SYSTEMS: On review of systems, the patient reports that she is doing well overall. She denies any chest pain, shortness of breath, cough, fevers, chills, night sweats, unintended weight changes. She endorses some positional arm soreness. She denies  any dizziness. A complete review of systems is obtained and is otherwise negative.     PHYSICAL EXAM:  Wt Readings from Last 3 Encounters:  09/09/23 174 lb 12.8 oz (79.3 kg)  09/06/23 171 lb (77.6 kg)  09/02/23 172 lb (78 kg)   Temp Readings from Last 3 Encounters:  09/09/23 97.8 F (36.6 C)  09/06/23 98.2 F (36.8 C) (Oral)  09/02/23 97.9 F (36.6 C) (Oral)   BP Readings from Last 3 Encounters:  09/09/23 126/69  09/06/23 136/87  09/02/23 122/68   Pulse Readings from Last 3 Encounters:  09/09/23 65  09/06/23 67  09/02/23 (!) 59   Pain Assessment Pain Score: 0-No pain/10  In general this is a well appearing female in no acute distress. She's alert and oriented x4 and appropriate throughout the examination. Cardiopulmonary assessment is negative for acute distress and she exhibits normal effort.     ECOG = 1  0 - Asymptomatic (Fully active, able to carry on all predisease activities without restriction)  1 - Symptomatic but completely ambulatory (Restricted in physically strenuous activity but ambulatory and able to carry out work of a light or sedentary nature. For example, light housework, office work)  2 - Symptomatic, <50% in bed during the day (Ambulatory and capable of all self care but unable to carry out any work activities. Up and about more than 50% of waking hours)  3 - Symptomatic, >50% in bed, but not bedbound (Capable of only limited self-care, confined to bed or chair 50% or more of waking hours)  4 - Bedbound (Completely disabled. Cannot carry on any self-care. Totally confined to bed or chair)  5 - Death   Santiago Glad MM, Creech RH, Tormey DC, et al. [phone]). "Toxicity and response criteria of the [facility]". Am. Evlyn Clines. Oncol. 5 (6): 649-55    LABORATORY DATA:  Lab Results  Component Value Date   WBC 3.9 (L) 09/06/2023   HGB 13.6 09/06/2023   HCT 40.5 09/06/2023   MCV 88.4 09/06/2023   PLT 251 09/06/2023   Lab Results   Component Value Date   NA 139 09/06/2023   K 3.7 09/06/2023   CL 104 09/06/2023   CO2 28 09/06/2023   Lab Results  Component Value Date   ALT 12 09/06/2023   AST 21 09/06/2023   ALKPHOS 75 09/06/2023   BILITOT 0.4 09/06/2023      RADIOGRAPHY: DG Chest Port 1 View  Result Date: 08/30/2023 CLINICAL DATA:  Status post bronchoscopy EXAM: PORTABLE CHEST 1 VIEW COMPARISON:  Chest CT 06/25/2023.  Chest x-ray 06/25/2023. FINDINGS: There some new strandy and patchy opacities in both lung bases. There is no pleural effusion or pneumothorax. The cardiomediastinal silhouette is stable and within normal limits. No acute fractures are seen. IMPRESSION: New strandy and patchy opacities in both lung bases. No pneumothorax. Electronically Signed   By: Darliss Cheney M.D.   On: 08/30/2023 16:20   DG C-ARM BRONCHOSCOPY  Result Date: 08/30/2023 C-ARM BRONCHOSCOPY: Fluoroscopy was utilized by the requesting physician.  No radiographic interpretation.       IMPRESSION/PLAN: 1. Stage IIIC (T3, N3, M0) non-small cell lung cancer, adenocarcinoma. he had left supraclavicular nodal metastasis and 2 left lung nodules suspicious for bronchogenic carcinoma and/or metastasis. There is also a short segment of wall thickening involving the splenic flexure of the colon with associated mild hypermetabolism which is nonspecific and colonoscopy was recommended.   Today, we discussed the pathology results and the nature of non-small cell lung cancer. Dr. Mitzi Hansen is in agreement with plans for concurrent chemoradiation. Her first cycle of chemotherapy is scheduled for 09/19/23.    Dr. Mitzi Hansen discussed the delivery and logistics of radiation and anticipates a course of 6.5 weeks of radiotherapy to the left lung and supraclavicular lymph node. We discussed the risks, benefits, short, and long term effects of radiotherapy, as well as the curative intent, and the patient is interested in proceeding. Patient has a good understanding  of the treatment plan and is ready to begin treatment. All questions were answered. A consent form was signed and placed in patient's chart today. We will schedule CT simulation and plan to begin radiation treatment at the start of her first chemotherapy cycle. We look forward to participating in this patient's care.    In a visit lasting 60 minutes, greater than 50% of the time was spent face to face discussing the patient's condition, in preparation for the discussion, and coordinating the patient's care.   The above documentation  reflects my direct findings during this shared patient visit. Please see the separate note by Dr. Mitzi Hansen on this date for the remainder of the patient's plan of care.    Joyice Faster, PA-C    **Disclaimer: This note was dictated with voice recognition software. Similar sounding words can inadvertently be transcribed and this note may contain transcription errors which may not have been corrected upon publication of note.**

## 2023-09-12 ENCOUNTER — Encounter (HOSPITAL_COMMUNITY): Payer: Self-pay

## 2023-09-13 DIAGNOSIS — C3482 Malignant neoplasm of overlapping sites of left bronchus and lung: Secondary | ICD-10-CM | POA: Diagnosis not present

## 2023-09-13 DIAGNOSIS — Z87891 Personal history of nicotine dependence: Secondary | ICD-10-CM | POA: Diagnosis not present

## 2023-09-14 DIAGNOSIS — C3492 Malignant neoplasm of unspecified part of left bronchus or lung: Secondary | ICD-10-CM | POA: Diagnosis not present

## 2023-09-15 ENCOUNTER — Inpatient Hospital Stay: Payer: Medicare Other

## 2023-09-15 ENCOUNTER — Encounter: Payer: Self-pay | Admitting: Physician Assistant

## 2023-09-15 ENCOUNTER — Ambulatory Visit
Admission: RE | Admit: 2023-09-15 | Discharge: 2023-09-15 | Disposition: A | Discharge status: Home / Self Care | Payer: Medicare Other | Source: Ambulatory Visit | Attending: Radiation Oncology | Admitting: Radiation Oncology

## 2023-09-15 DIAGNOSIS — Z510 Encounter for antineoplastic radiation therapy: Secondary | ICD-10-CM | POA: Diagnosis not present

## 2023-09-15 DIAGNOSIS — C3482 Malignant neoplasm of overlapping sites of left bronchus and lung: Secondary | ICD-10-CM | POA: Diagnosis not present

## 2023-09-15 DIAGNOSIS — I252 Old myocardial infarction: Secondary | ICD-10-CM | POA: Diagnosis not present

## 2023-09-15 DIAGNOSIS — Z87891 Personal history of nicotine dependence: Secondary | ICD-10-CM | POA: Diagnosis not present

## 2023-09-15 DIAGNOSIS — M79602 Pain in left arm: Secondary | ICD-10-CM | POA: Diagnosis not present

## 2023-09-15 DIAGNOSIS — R29818 Other symptoms and signs involving the nervous system: Secondary | ICD-10-CM | POA: Diagnosis not present

## 2023-09-15 DIAGNOSIS — Z5111 Encounter for antineoplastic chemotherapy: Secondary | ICD-10-CM | POA: Diagnosis not present

## 2023-09-16 ENCOUNTER — Encounter (HOSPITAL_COMMUNITY): Payer: Self-pay

## 2023-09-19 MED FILL — Dexamethasone Sodium Phosphate Inj 100 MG/10ML: INTRAMUSCULAR | Qty: 1 | Status: AC

## 2023-09-20 ENCOUNTER — Inpatient Hospital Stay: Payer: Medicare Other

## 2023-09-20 ENCOUNTER — Other Ambulatory Visit: Payer: Medicare Other

## 2023-09-20 ENCOUNTER — Other Ambulatory Visit: Payer: Self-pay

## 2023-09-20 ENCOUNTER — Ambulatory Visit
Admission: RE | Admit: 2023-09-20 | Discharge: 2023-09-20 | Disposition: A | Discharge status: Home / Self Care | Payer: Medicare Other | Source: Ambulatory Visit | Attending: Radiation Oncology | Admitting: Radiation Oncology

## 2023-09-20 ENCOUNTER — Ambulatory Visit: Payer: Medicare Other | Admitting: Physician Assistant

## 2023-09-20 VITALS — BP 144/77 | HR 72 | Temp 97.7°F | Resp 24 | Wt 173.5 lb

## 2023-09-20 DIAGNOSIS — Z510 Encounter for antineoplastic radiation therapy: Secondary | ICD-10-CM | POA: Diagnosis not present

## 2023-09-20 DIAGNOSIS — C3412 Malignant neoplasm of upper lobe, left bronchus or lung: Secondary | ICD-10-CM

## 2023-09-20 DIAGNOSIS — Z5111 Encounter for antineoplastic chemotherapy: Secondary | ICD-10-CM | POA: Diagnosis not present

## 2023-09-20 DIAGNOSIS — Z87891 Personal history of nicotine dependence: Secondary | ICD-10-CM | POA: Diagnosis not present

## 2023-09-20 DIAGNOSIS — C3482 Malignant neoplasm of overlapping sites of left bronchus and lung: Secondary | ICD-10-CM | POA: Diagnosis not present

## 2023-09-20 DIAGNOSIS — R29818 Other symptoms and signs involving the nervous system: Secondary | ICD-10-CM | POA: Diagnosis not present

## 2023-09-20 DIAGNOSIS — M79602 Pain in left arm: Secondary | ICD-10-CM | POA: Diagnosis not present

## 2023-09-20 DIAGNOSIS — I252 Old myocardial infarction: Secondary | ICD-10-CM | POA: Diagnosis not present

## 2023-09-20 LAB — CMP (CANCER CENTER ONLY)
ALT: 10 U/L (ref 0–44)
AST: 20 U/L (ref 15–41)
Albumin: 3.8 g/dL (ref 3.5–5.0)
Alkaline Phosphatase: 76 U/L (ref 38–126)
Anion gap: 9 (ref 5–15)
BUN: 13 mg/dL (ref 8–23)
CO2: 24 mmol/L (ref 22–32)
Calcium: 10 mg/dL (ref 8.9–10.3)
Chloride: 105 mmol/L (ref 98–111)
Creatinine: 0.92 mg/dL (ref 0.44–1.00)
GFR, Estimated: >60 mL/min (ref >60)
Glucose, Bld: 164 mg/dL — ABNORMAL HIGH (ref 70–99)
Potassium: 3.4 mmol/L — ABNORMAL LOW (ref 3.5–5.1)
Sodium: 138 mmol/L (ref 135–145)
Total Bilirubin: 0.5 mg/dL (ref 0.3–1.2)
Total Protein: 6.7 g/dL (ref 6.5–8.1)

## 2023-09-20 LAB — RAD ONC ARIA SESSION SUMMARY
Course Elapsed Days: 0
Plan Fractions Treated to Date: 1
Plan Prescribed Dose Per Fraction: 2 Gy
Plan Total Fractions Prescribed: 30
Plan Total Prescribed Dose: 60 Gy
Reference Point Dosage Given to Date: 2 Gy
Reference Point Session Dosage Given: 2 Gy
Session Number: 1

## 2023-09-20 LAB — CBC WITH DIFFERENTIAL (CANCER CENTER ONLY)
Abs Immature Granulocytes: 0.01 10*3/uL (ref 0.00–0.07)
Basophils Absolute: 0 10*3/uL (ref 0.0–0.1)
Basophils Relative: 1 %
Eosinophils Absolute: 0.1 10*3/uL (ref 0.0–0.5)
Eosinophils Relative: 3 %
HCT: 41 % (ref 36.0–46.0)
Hemoglobin: 13.7 g/dL (ref 12.0–15.0)
Immature Granulocytes: 0 %
Lymphocytes Relative: 34 %
Lymphs Abs: 1 10*3/uL (ref 0.7–4.0)
MCH: 29.5 pg (ref 26.0–34.0)
MCHC: 33.4 g/dL (ref 30.0–36.0)
MCV: 88.4 fL (ref 80.0–100.0)
Monocytes Absolute: 0.3 10*3/uL (ref 0.1–1.0)
Monocytes Relative: 9 %
Neutro Abs: 1.6 10*3/uL — ABNORMAL LOW (ref 1.7–7.7)
Neutrophils Relative %: 53 %
Platelet Count: 252 10*3/uL (ref 150–400)
RBC: 4.64 MIL/uL (ref 3.87–5.11)
RDW: 13.5 % (ref 11.5–15.5)
WBC Count: 3 10*3/uL — ABNORMAL LOW (ref 4.0–10.5)
nRBC: 0 % (ref 0.0–0.2)

## 2023-09-20 MED ORDER — SODIUM CHLORIDE 0.9 % IV SOLN
45.0000 mg/m2 | Freq: Once | INTRAVENOUS | Status: AC
  Administered 2023-09-20: 84 mg via INTRAVENOUS
  Filled 2023-09-20: qty 14

## 2023-09-20 MED ORDER — SODIUM CHLORIDE 0.9 % IV SOLN
10.0000 mg | Freq: Once | INTRAVENOUS | Status: AC
  Administered 2023-09-20: 10 mg via INTRAVENOUS
  Filled 2023-09-20: qty 10

## 2023-09-20 MED ORDER — SODIUM CHLORIDE 0.9 % IV SOLN
161.8000 mg | Freq: Once | INTRAVENOUS | Status: AC
  Administered 2023-09-20: 160 mg via INTRAVENOUS
  Filled 2023-09-20: qty 16

## 2023-09-20 MED ORDER — PALONOSETRON HCL INJECTION 0.25 MG/5ML
0.2500 mg | Freq: Once | INTRAVENOUS | Status: AC
  Administered 2023-09-20: 0.25 mg via INTRAVENOUS
  Filled 2023-09-20: qty 5

## 2023-09-20 MED ORDER — FAMOTIDINE IN NACL 20-0.9 MG/50ML-% IV SOLN
20.0000 mg | Freq: Once | INTRAVENOUS | Status: AC
  Administered 2023-09-20: 20 mg via INTRAVENOUS
  Filled 2023-09-20: qty 50

## 2023-09-20 MED ORDER — DIPHENHYDRAMINE HCL 50 MG/ML IJ SOLN
50.0000 mg | Freq: Once | INTRAMUSCULAR | Status: AC
  Administered 2023-09-20: 50 mg via INTRAVENOUS
  Filled 2023-09-20: qty 1

## 2023-09-20 MED ORDER — SODIUM CHLORIDE 0.9 % IV SOLN: Freq: Once | INTRAVENOUS | Status: AC

## 2023-09-20 NOTE — Patient Instructions (Signed)
[facility]  Discharge Instructions: Thank you for choosing [facility] to provide your oncology and hematology care.   If you have a lab appointment with the Cancer Center, please go directly to the Cancer Center and check in at the registration area.   Wear comfortable clothing and clothing appropriate for easy access to any Portacath or PICC line.   We strive to give you quality time with your provider. You may need to reschedule your appointment if you arrive late (15 or more minutes).  Arriving late affects you and other patients whose appointments are after yours.  Also, if you miss three or more appointments without notifying the office, you may be dismissed from the clinic at the provider's discretion.      For prescription refill requests, have your pharmacy contact our office and allow 72 hours for refills to be completed.    Today you received the following chemotherapy and/or immunotherapy agents: Paclitaxel and Carboplatin      To help prevent nausea and vomiting after your treatment, we encourage you to take your nausea medication as directed.  BELOW ARE SYMPTOMS THAT SHOULD BE REPORTED IMMEDIATELY: *FEVER GREATER THAN 100.4 F (38 C) OR HIGHER *CHILLS OR SWEATING *NAUSEA AND VOMITING THAT IS NOT CONTROLLED WITH YOUR NAUSEA MEDICATION *UNUSUAL SHORTNESS OF BREATH *UNUSUAL BRUISING OR BLEEDING *URINARY PROBLEMS (pain or burning when urinating, or frequent urination) *BOWEL PROBLEMS (unusual diarrhea, constipation, pain near the anus) TENDERNESS IN MOUTH AND THROAT WITH OR WITHOUT PRESENCE OF ULCERS (sore throat, sores in mouth, or a toothache) UNUSUAL RASH, SWELLING OR PAIN  UNUSUAL VAGINAL DISCHARGE OR ITCHING   Items with * indicate a potential emergency and should be followed up as soon as possible or go to the Emergency Department if any problems should occur.  Please show the CHEMOTHERAPY ALERT CARD or IMMUNOTHERAPY  ALERT CARD at check-in to the Emergency Department and triage nurse.  Should you have questions after your visit or need to cancel or reschedule your appointment, please contact [facility]  Dept: [phone]  and follow the prompts.  Office hours are 8:00 a.m. to 4:30 p.m. Monday - Friday. Please note that voicemails left after 4:00 p.m. may not be returned until the following business day.  We are closed weekends and major holidays. You have access to a nurse at all times for urgent questions. Please call the main number to the clinic Dept: [phone] and follow the prompts.   For any non-urgent questions, you may also contact your provider using MyChart. We now offer e-Visits for anyone 23 and older to request care online for non-urgent symptoms. For details visit [email or web address].   Also download the MyChart app! Go to the app store, search "MyChart", open the app, select [place], and log in with your MyChart username and password. Paclitaxel Injection What is this medication? PACLITAXEL (PAK li TAX el) treats some types of cancer. It works by slowing down the growth of cancer cells. This medicine may be used for other purposes; ask your health care provider or pharmacist if you have questions. COMMON BRAND NAME(S): Onxol, Taxol What should I tell my care team before I take this medication? They need to know if you have any of these conditions: Heart disease Liver disease Low white blood cell levels An unusual or allergic reaction to paclitaxel, other medications, foods, dyes, or preservatives If you or your partner are pregnant or trying to  get pregnant Breast-feeding How should I use this medication? This medication is injected into a vein. It is given by your care team in a hospital or clinic setting. Talk to your care team about the use of this medication in children. While it may be given to children for selected conditions,  precautions do apply. Overdosage: If you think you have taken too much of this medicine contact a poison control center or emergency room at once. NOTE: This medicine is only for you. Do not share this medicine with others. What if I miss a dose? Keep appointments for follow-up doses. It is important not to miss your dose. Call your care team if you are unable to keep an appointment. What may interact with this medication? Do not take this medication with any of the following: Live virus vaccines Other medications may affect the way this medication works. Talk with your care team about all of the medications you take. They may suggest changes to your treatment plan to lower the risk of side effects and to make sure your medications work as intended. This list may not describe all possible interactions. Give your health care provider a list of all the medicines, herbs, non-prescription drugs, or dietary supplements you use. Also tell them if you smoke, drink alcohol, or use illegal drugs. Some items may interact with your medicine. What should I watch for while using this medication? Your condition will be monitored carefully while you are receiving this medication. You may need blood work while taking this medication. This medication may make you feel generally unwell. This is not uncommon as chemotherapy can affect healthy cells as well as cancer cells. Report any side effects. Continue your course of treatment even though you feel ill unless your care team tells you to stop. This medication can cause serious allergic reactions. To reduce the risk, your care team may give you other medications to take before receiving this one. Be sure to follow the directions from your care team. This medication may increase your risk of getting an infection. Call your care team for advice if you get a fever, chills, sore throat, or other symptoms of a cold or flu. Do not treat yourself. Try to avoid being around  people who are sick. This medication may increase your risk to bruise or bleed. Call your care team if you notice any unusual bleeding. Be careful brushing or flossing your teeth or using a toothpick because you may get an infection or bleed more easily. If you have any dental work done, tell your dentist you are receiving this medication. Talk to your care team if you may be pregnant. Serious birth defects can occur if you take this medication during pregnancy. Talk to your care team before breastfeeding. Changes to your treatment plan may be needed. What side effects may I notice from receiving this medication? Side effects that you should report to your care team as soon as possible: Allergic reactions--skin rash, itching, hives, swelling of the face, lips, tongue, or throat Heart rhythm changes--fast or irregular heartbeat, dizziness, feeling faint or lightheaded, chest pain, trouble breathing Increase in blood pressure Infection--fever, chills, cough, sore throat, wounds that don't heal, pain or trouble when passing urine, general feeling of discomfort or being unwell Low blood pressure--dizziness, feeling faint or lightheaded, blurry vision Low red blood cell level--unusual weakness or fatigue, dizziness, headache, trouble breathing Painful swelling, warmth, or redness of the skin, blisters or sores at the infusion site Pain, tingling, or numbness  in the hands or feet Slow heartbeat--dizziness, feeling faint or lightheaded, confusion, trouble breathing, unusual weakness or fatigue Unusual bruising or bleeding Side effects that usually do not require medical attention (report to your care team if they continue or are bothersome): Diarrhea Hair loss Joint pain Loss of appetite Muscle pain Nausea Vomiting This list may not describe all possible side effects. Call your doctor for medical advice about side effects. You may report side effects to FDA at 1-800-FDA-1088. Where should I keep  my medication? This medication is given in a hospital or clinic. It will not be stored at home. NOTE: This sheet is a summary. It may not cover all possible information. If you have questions about this medicine, talk to your doctor, pharmacist, or health care provider.  2024 Elsevier/Gold Standard (2022-04-13 00:00:00) Carboplatin Injection What is this medication? CARBOPLATIN (KAR boe pla tin) treats some types of cancer. It works by slowing down the growth of cancer cells. This medicine may be used for other purposes; ask your health care provider or pharmacist if you have questions. COMMON BRAND NAME(S): Paraplatin What should I tell my care team before I take this medication? They need to know if you have any of these conditions: Blood disorders Hearing problems Kidney disease Recent or ongoing radiation therapy An unusual or allergic reaction to carboplatin, cisplatin, other medications, foods, dyes, or preservatives Pregnant or trying to get pregnant Breast-feeding How should I use this medication? This medication is injected into a vein. It is given by your care team in a hospital or clinic setting. Talk to your care team about the use of this medication in children. Special care may be needed. Overdosage: If you think you have taken too much of this medicine contact a poison control center or emergency room at once. NOTE: This medicine is only for you. Do not share this medicine with others. What if I miss a dose? Keep appointments for follow-up doses. It is important not to miss your dose. Call your care team if you are unable to keep an appointment. What may interact with this medication? Medications for seizures Some antibiotics, such as amikacin, gentamicin, neomycin, streptomycin, tobramycin Vaccines This list may not describe all possible interactions. Give your health care provider a list of all the medicines, herbs, non-prescription drugs, or dietary supplements you  use. Also tell them if you smoke, drink alcohol, or use illegal drugs. Some items may interact with your medicine. What should I watch for while using this medication? Your condition will be monitored carefully while you are receiving this medication. You may need blood work while taking this medication. This medication may make you feel generally unwell. This is not uncommon, as chemotherapy can affect healthy cells as well as cancer cells. Report any side effects. Continue your course of treatment even though you feel ill unless your care team tells you to stop. In some cases, you may be given additional medications to help with side effects. Follow all directions for their use. This medication may increase your risk of getting an infection. Call your care team for advice if you get a fever, chills, sore throat, or other symptoms of a cold or flu. Do not treat yourself. Try to avoid being around people who are sick. Avoid taking medications that contain aspirin, acetaminophen, ibuprofen, naproxen, or ketoprofen unless instructed by your care team. These medications may hide a fever. Be careful brushing or flossing your teeth or using a toothpick because you may get an infection  or bleed more easily. If you have any dental work done, tell your dentist you are receiving this medication. Talk to your care team if you wish to become pregnant or think you might be pregnant. This medication can cause serious birth defects. Talk to your care team about effective forms of contraception. Do not breast-feed while taking this medication. What side effects may I notice from receiving this medication? Side effects that you should report to your care team as soon as possible: Allergic reactions--skin rash, itching, hives, swelling of the face, lips, tongue, or throat Infection--fever, chills, cough, sore throat, wounds that don't heal, pain or trouble when passing urine, general feeling of discomfort or being  unwell Low red blood cell level--unusual weakness or fatigue, dizziness, headache, trouble breathing Pain, tingling, or numbness in the hands or feet, muscle weakness, change in vision, confusion or trouble speaking, loss of balance or coordination, trouble walking, seizures Unusual bruising or bleeding Side effects that usually do not require medical attention (report to your care team if they continue or are bothersome): Hair loss Nausea Unusual weakness or fatigue Vomiting This list may not describe all possible side effects. Call your doctor for medical advice about side effects. You may report side effects to FDA at 1-800-FDA-1088. Where should I keep my medication? This medication is given in a hospital or clinic. It will not be stored at home. NOTE: This sheet is a summary. It may not cover all possible information. If you have questions about this medicine, talk to your doctor, pharmacist, or health care provider.  2024 Elsevier/Gold Standard (2022-03-16 00:00:00)

## 2023-09-21 ENCOUNTER — Telehealth: Payer: Self-pay

## 2023-09-21 ENCOUNTER — Ambulatory Visit
Admission: RE | Admit: 2023-09-21 | Discharge: 2023-09-21 | Disposition: A | Discharge status: Home / Self Care | Payer: Medicare Other | Source: Ambulatory Visit | Attending: Radiation Oncology | Admitting: Radiation Oncology

## 2023-09-21 ENCOUNTER — Other Ambulatory Visit: Payer: Self-pay

## 2023-09-21 DIAGNOSIS — M79602 Pain in left arm: Secondary | ICD-10-CM | POA: Diagnosis not present

## 2023-09-21 DIAGNOSIS — Z87891 Personal history of nicotine dependence: Secondary | ICD-10-CM | POA: Diagnosis not present

## 2023-09-21 DIAGNOSIS — Z510 Encounter for antineoplastic radiation therapy: Secondary | ICD-10-CM | POA: Diagnosis not present

## 2023-09-21 DIAGNOSIS — Z5111 Encounter for antineoplastic chemotherapy: Secondary | ICD-10-CM | POA: Diagnosis not present

## 2023-09-21 DIAGNOSIS — R29818 Other symptoms and signs involving the nervous system: Secondary | ICD-10-CM | POA: Diagnosis not present

## 2023-09-21 DIAGNOSIS — C3482 Malignant neoplasm of overlapping sites of left bronchus and lung: Secondary | ICD-10-CM | POA: Diagnosis not present

## 2023-09-21 DIAGNOSIS — I252 Old myocardial infarction: Secondary | ICD-10-CM | POA: Diagnosis not present

## 2023-09-21 LAB — RAD ONC ARIA SESSION SUMMARY
Course Elapsed Days: 1
Plan Fractions Treated to Date: 2
Plan Prescribed Dose Per Fraction: 2 Gy
Plan Total Fractions Prescribed: 30
Plan Total Prescribed Dose: 60 Gy
Reference Point Dosage Given to Date: 4 Gy
Reference Point Session Dosage Given: 2 Gy
Session Number: 2

## 2023-09-21 NOTE — Telephone Encounter (Signed)
Per Merlene Laughter, [facility], OK for patient to have treatment one day early as long as her labs are WNL.

## 2023-09-21 NOTE — Telephone Encounter (Signed)
LM for patient that this nurse was calling to see how they were doing after their treatment. Please call back to Dr. Rudi Rummage nurse at [phone] if they have any questions or concerns regarding the treatment.

## 2023-09-21 NOTE — Telephone Encounter (Signed)
-----   Message from Nurse Dillard Essex sent at 09/20/2023  1:21 PM EDT ----- Regarding: Dr. Arbutus Ped Pt. first time Taxol and Carboplatin. Tolerated treatment well. Had a "high" feeling and slight restless legs with Benadryl 50 mg IV but I notified MD and pharmacy to get changed to Ceterizine. Also, unable to maintain ice so we used compression. Instructed her to wear compression socks for next treatment.

## 2023-09-22 ENCOUNTER — Ambulatory Visit
Admission: RE | Admit: 2023-09-22 | Discharge: 2023-09-22 | Disposition: A | Discharge status: Home / Self Care | Payer: Medicare Other | Source: Ambulatory Visit | Attending: Radiation Oncology | Admitting: Radiation Oncology

## 2023-09-22 ENCOUNTER — Other Ambulatory Visit: Payer: Self-pay

## 2023-09-22 DIAGNOSIS — Z5111 Encounter for antineoplastic chemotherapy: Secondary | ICD-10-CM | POA: Diagnosis not present

## 2023-09-22 DIAGNOSIS — I252 Old myocardial infarction: Secondary | ICD-10-CM | POA: Diagnosis not present

## 2023-09-22 DIAGNOSIS — Z87891 Personal history of nicotine dependence: Secondary | ICD-10-CM | POA: Diagnosis not present

## 2023-09-22 DIAGNOSIS — C3482 Malignant neoplasm of overlapping sites of left bronchus and lung: Secondary | ICD-10-CM | POA: Diagnosis not present

## 2023-09-22 DIAGNOSIS — R29818 Other symptoms and signs involving the nervous system: Secondary | ICD-10-CM | POA: Diagnosis not present

## 2023-09-22 DIAGNOSIS — M79602 Pain in left arm: Secondary | ICD-10-CM | POA: Diagnosis not present

## 2023-09-22 DIAGNOSIS — Z510 Encounter for antineoplastic radiation therapy: Secondary | ICD-10-CM | POA: Diagnosis not present

## 2023-09-22 LAB — RAD ONC ARIA SESSION SUMMARY
Course Elapsed Days: 2
Plan Fractions Treated to Date: 3
Plan Prescribed Dose Per Fraction: 2 Gy
Plan Total Fractions Prescribed: 30
Plan Total Prescribed Dose: 60 Gy
Reference Point Dosage Given to Date: 6 Gy
Reference Point Session Dosage Given: 2 Gy
Session Number: 3

## 2023-09-23 ENCOUNTER — Other Ambulatory Visit: Payer: Self-pay

## 2023-09-23 ENCOUNTER — Ambulatory Visit
Admission: RE | Admit: 2023-09-23 | Discharge: 2023-09-23 | Disposition: A | Discharge status: Home / Self Care | Payer: Medicare Other | Source: Ambulatory Visit | Attending: Radiation Oncology

## 2023-09-23 ENCOUNTER — Ambulatory Visit
Admission: RE | Admit: 2023-09-23 | Discharge: 2023-09-23 | Disposition: A | Discharge status: Home / Self Care | Payer: Medicare Other | Source: Ambulatory Visit | Attending: Radiation Oncology | Admitting: Radiation Oncology

## 2023-09-23 DIAGNOSIS — Z87891 Personal history of nicotine dependence: Secondary | ICD-10-CM | POA: Diagnosis not present

## 2023-09-23 DIAGNOSIS — R29818 Other symptoms and signs involving the nervous system: Secondary | ICD-10-CM | POA: Diagnosis not present

## 2023-09-23 DIAGNOSIS — C3412 Malignant neoplasm of upper lobe, left bronchus or lung: Secondary | ICD-10-CM

## 2023-09-23 DIAGNOSIS — Z510 Encounter for antineoplastic radiation therapy: Secondary | ICD-10-CM | POA: Diagnosis not present

## 2023-09-23 DIAGNOSIS — C3482 Malignant neoplasm of overlapping sites of left bronchus and lung: Secondary | ICD-10-CM | POA: Diagnosis not present

## 2023-09-23 DIAGNOSIS — I252 Old myocardial infarction: Secondary | ICD-10-CM | POA: Diagnosis not present

## 2023-09-23 DIAGNOSIS — M79602 Pain in left arm: Secondary | ICD-10-CM | POA: Diagnosis not present

## 2023-09-23 DIAGNOSIS — Z5111 Encounter for antineoplastic chemotherapy: Secondary | ICD-10-CM | POA: Diagnosis not present

## 2023-09-23 LAB — RAD ONC ARIA SESSION SUMMARY
Course Elapsed Days: 3
Plan Fractions Treated to Date: 4
Plan Prescribed Dose Per Fraction: 2 Gy
Plan Total Fractions Prescribed: 30
Plan Total Prescribed Dose: 60 Gy
Reference Point Dosage Given to Date: 8 Gy
Reference Point Session Dosage Given: 2 Gy
Session Number: 4

## 2023-09-23 MED ORDER — RADIAPLEXRX EX GEL: Freq: Once | CUTANEOUS | Status: AC

## 2023-09-26 ENCOUNTER — Ambulatory Visit
Admission: RE | Admit: 2023-09-26 | Discharge: 2023-09-26 | Disposition: A | Discharge status: Home / Self Care | Payer: Medicare Other | Source: Ambulatory Visit | Attending: Radiation Oncology | Admitting: Radiation Oncology

## 2023-09-26 ENCOUNTER — Inpatient Hospital Stay: Payer: Medicare Other

## 2023-09-26 ENCOUNTER — Other Ambulatory Visit: Payer: Self-pay

## 2023-09-26 ENCOUNTER — Inpatient Hospital Stay (HOSPITAL_BASED_OUTPATIENT_CLINIC_OR_DEPARTMENT_OTHER): Payer: Medicare Other | Admitting: Internal Medicine

## 2023-09-26 VITALS — BP 121/73 | HR 65 | Resp 16

## 2023-09-26 DIAGNOSIS — C3482 Malignant neoplasm of overlapping sites of left bronchus and lung: Secondary | ICD-10-CM | POA: Diagnosis not present

## 2023-09-26 DIAGNOSIS — C3412 Malignant neoplasm of upper lobe, left bronchus or lung: Secondary | ICD-10-CM | POA: Diagnosis not present

## 2023-09-26 DIAGNOSIS — Z510 Encounter for antineoplastic radiation therapy: Secondary | ICD-10-CM | POA: Diagnosis not present

## 2023-09-26 DIAGNOSIS — R29818 Other symptoms and signs involving the nervous system: Secondary | ICD-10-CM | POA: Diagnosis not present

## 2023-09-26 DIAGNOSIS — Z87891 Personal history of nicotine dependence: Secondary | ICD-10-CM | POA: Diagnosis not present

## 2023-09-26 DIAGNOSIS — Z5111 Encounter for antineoplastic chemotherapy: Secondary | ICD-10-CM | POA: Diagnosis not present

## 2023-09-26 DIAGNOSIS — I252 Old myocardial infarction: Secondary | ICD-10-CM | POA: Diagnosis not present

## 2023-09-26 DIAGNOSIS — M79602 Pain in left arm: Secondary | ICD-10-CM | POA: Diagnosis not present

## 2023-09-26 LAB — CBC WITH DIFFERENTIAL (CANCER CENTER ONLY)
Abs Immature Granulocytes: 0.01 10*3/uL (ref 0.00–0.07)
Basophils Absolute: 0 10*3/uL (ref 0.0–0.1)
Basophils Relative: 1 %
Eosinophils Absolute: 0.1 10*3/uL (ref 0.0–0.5)
Eosinophils Relative: 5 %
HCT: 38.9 % (ref 36.0–46.0)
Hemoglobin: 13.5 g/dL (ref 12.0–15.0)
Immature Granulocytes: 0 %
Lymphocytes Relative: 32 %
Lymphs Abs: 0.9 10*3/uL (ref 0.7–4.0)
MCH: 30.5 pg (ref 26.0–34.0)
MCHC: 34.7 g/dL (ref 30.0–36.0)
MCV: 88 fL (ref 80.0–100.0)
Monocytes Absolute: 0.2 10*3/uL (ref 0.1–1.0)
Monocytes Relative: 7 %
Neutro Abs: 1.6 10*3/uL — ABNORMAL LOW (ref 1.7–7.7)
Neutrophils Relative %: 55 %
Platelet Count: 252 10*3/uL (ref 150–400)
RBC: 4.42 MIL/uL (ref 3.87–5.11)
RDW: 13.1 % (ref 11.5–15.5)
WBC Count: 2.9 10*3/uL — ABNORMAL LOW (ref 4.0–10.5)
nRBC: 0 % (ref 0.0–0.2)

## 2023-09-26 LAB — RAD ONC ARIA SESSION SUMMARY
Course Elapsed Days: 6
Plan Fractions Treated to Date: 5
Plan Prescribed Dose Per Fraction: 2 Gy
Plan Total Fractions Prescribed: 30
Plan Total Prescribed Dose: 60 Gy
Reference Point Dosage Given to Date: 10 Gy
Reference Point Session Dosage Given: 2 Gy
Session Number: 5

## 2023-09-26 LAB — CMP (CANCER CENTER ONLY)
ALT: 12 U/L (ref 0–44)
AST: 21 U/L (ref 15–41)
Albumin: 3.7 g/dL (ref 3.5–5.0)
Alkaline Phosphatase: 66 U/L (ref 38–126)
Anion gap: 7 (ref 5–15)
BUN: 11 mg/dL (ref 8–23)
CO2: 27 mmol/L (ref 22–32)
Calcium: 9.7 mg/dL (ref 8.9–10.3)
Chloride: 105 mmol/L (ref 98–111)
Creatinine: 0.83 mg/dL (ref 0.44–1.00)
GFR, Estimated: >60 mL/min (ref >60)
Glucose, Bld: 120 mg/dL — ABNORMAL HIGH (ref 70–99)
Potassium: 3.3 mmol/L — ABNORMAL LOW (ref 3.5–5.1)
Sodium: 139 mmol/L (ref 135–145)
Total Bilirubin: 0.6 mg/dL (ref 0.3–1.2)
Total Protein: 6.1 g/dL — ABNORMAL LOW (ref 6.5–8.1)

## 2023-09-26 MED ORDER — SODIUM CHLORIDE 0.9 % IV SOLN
161.8000 mg | Freq: Once | INTRAVENOUS | Status: AC
  Administered 2023-09-26: 160 mg via INTRAVENOUS
  Filled 2023-09-26: qty 16

## 2023-09-26 MED ORDER — SODIUM CHLORIDE 0.9 % IV SOLN: Freq: Once | INTRAVENOUS | Status: AC

## 2023-09-26 MED ORDER — PALONOSETRON HCL INJECTION 0.25 MG/5ML
0.2500 mg | Freq: Once | INTRAVENOUS | Status: AC
  Administered 2023-09-26: 0.25 mg via INTRAVENOUS
  Filled 2023-09-26: qty 5

## 2023-09-26 MED ORDER — SODIUM CHLORIDE 0.9 % IV SOLN
45.0000 mg/m2 | Freq: Once | INTRAVENOUS | Status: AC
  Administered 2023-09-26: 84 mg via INTRAVENOUS
  Filled 2023-09-26: qty 14

## 2023-09-26 MED ORDER — DEXAMETHASONE SODIUM PHOSPHATE 10 MG/ML IJ SOLN
10.0000 mg | Freq: Once | INTRAMUSCULAR | Status: AC
  Administered 2023-09-26: 10 mg via INTRAVENOUS
  Filled 2023-09-26: qty 1

## 2023-09-26 MED ORDER — CETIRIZINE HCL 10 MG/ML IV SOLN
10.0000 mg | Freq: Once | INTRAVENOUS | Status: AC
Start: 2023-09-26 — End: 2023-09-26
  Administered 2023-09-26: 10 mg via INTRAVENOUS
  Filled 2023-09-26: qty 1

## 2023-09-26 MED ORDER — FAMOTIDINE IN NACL 20-0.9 MG/50ML-% IV SOLN
20.0000 mg | Freq: Once | INTRAVENOUS | Status: AC
  Administered 2023-09-26: 20 mg via INTRAVENOUS
  Filled 2023-09-26: qty 50

## 2023-09-26 NOTE — Progress Notes (Signed)
[facility] [phone]   [phone]) [phone]  OFFICE PROGRESS NOTE  Emilio Aspen, MD [address] [place] [address]  DIAGNOSIS: Stage IIIC (T3, N3, M0) non-small cell lung cancer, adenocarcinoma presented with supraclavicular lymphadenopathy, 2 left lung nodules diagnosed in September 2024.  There is also a short segment of wall thickening involving the splenic flexure of the colon with associated mild hypermetabolism which is nonspecific and colonoscopy was recommended.   Biomarker Findings HRD signature - HRDsig Negative Microsatellite status - MS-Equivocal ? Tumor Mutational Burden - 2 Muts/Mb Genomic Findings For a complete list of the genes assayed, please refer to the Appendix. KRAS G12C TP53 R110L 7 Disease relevant genes with no reportable alterations: ALK, BRAF, EGFR, ERBB2, MET, RET, ROS1  PDL1 Expression 5%.  PRIOR THERAPY: None   CURRENT THERAPY: Concurrent chemoradiation with carboplatin for an AUC of 2 and taxol 45 mg/m2.  First dose September 20, 2023  INTERVAL HISTORY: Stephanie Newton 82 y.o. female returns to the clinic today for follow-up visit accompanied by her daughter Vikki Ports.Discussed the use of AI scribe software for clinical note transcription with the patient, who gave verbal consent to proceed.  History of Present Illness   Stephanie Newton, a 82 year old individual diagnosed with stage 3C non-small cell lung cancer adenocarcinoma in September 2024, presented with a left supraclavicular lymph node and two nodules in the left lung. She recently started her first dose of chemo and radiation therapy with weekly carboplatin and paclitaxel.  Following the first week of treatment, she reported no side effects from the chemo or radiation. She denied experiencing any nausea, vomiting, or diarrhea. She also denied any unexpected weight loss. However, she reported feeling some discomfort in the left  deltoid region, which she attributed to shampooing her hair.  She has received five radiation treatments so far. Her lab work was reported as satisfactory for ongoing treatment. She expressed a commitment to maintaining her weight during the course of treatment.      MEDICAL HISTORY: Past Medical History:  Diagnosis Date   Arthritis    Dysrhythmia    Hypercholesteremia    Hypertension    Myocardial infarction [facility])     ALLERGIES:  is allergic to shellfish allergy.  MEDICATIONS:  Current Outpatient Medications  Medication Sig Dispense Refill   amLODipine (NORVASC) 2.5 MG tablet Take 2.5 mg by mouth daily.     aspirin EC 81 MG tablet Take 1 tablet by mouth daily.     azithromycin (ZITHROMAX) 250 MG tablet Take 2 tablets today, then one tablet each day for the next 6 days. 6 tablet 0   estradiol (ESTRACE) 1 MG tablet Take 1 mg by mouth daily.     famotidine (PEPCID) 20 MG tablet Take 20 mg by mouth as needed.     hydrochlorothiazide (HYDRODIURIL) 12.5 MG tablet Take 12.5 mg by mouth daily.      methocarbamol (ROBAXIN) 500 MG tablet Take 1 tablet (500 mg total) by mouth every 6 (six) hours as needed for muscle spasms. 40 tablet 0   metoprolol succinate (TOPROL-XL) 100 MG 24 hr tablet Take 100 mg by mouth daily.     metoprolol succinate (TOPROL-XL) 50 MG 24 hr tablet Take 1 tablet (50 mg total) by mouth daily. 90 tablet 1   oxyCODONE-acetaminophen (PERCOCET/ROXICET) 5-325 MG tablet Take 1-2 tablets by mouth every 8 (eight) hours as needed for severe pain. 6 tablet 0   prochlorperazine (COMPAZINE) 10  MG tablet Take 1 tablet (10 mg total) by mouth every 6 (six) hours as needed. 30 tablet 2   simvastatin (ZOCOR) 20 MG tablet Take 20 mg by mouth at bedtime.     No current facility-administered medications for this visit.    SURGICAL HISTORY:  Past Surgical History:  Procedure Laterality Date   BREAST EXCISIONAL BIOPSY Right    x2   BRONCHIAL BIOPSY  08/30/2023   Procedure: BRONCHIAL  BIOPSIES;  Surgeon: Josephine Igo, DO;  [facility];  Service: Pulmonary;;   EYE SURGERY     cataract bilateral    HERNIA REPAIR  06/2000   INCISIONAL VENTRAL ; DR Carman Ching    KNEE ARTHROSCOPY Left 08/23/2017   dr Thomasena Edis    LAPAROSCOPIC CHOLECYSTECTOMY  06/13/2000   DR DOUG [facility]    LEFT HEART CATH AND CORONARY ANGIOGRAPHY N/A 12/04/2018   Procedure: LEFT HEART CATH AND CORONARY ANGIOGRAPHY;  Surgeon: Lyn Records, MD;  [facility];  Service: Cardiovascular;  Laterality: N/A;   TOTAL KNEE ARTHROPLASTY Left 03/31/2018   Procedure: LEFT TOTAL KNEE ARTHROPLASTY;  Surgeon: Eugenia Mcalpine, MD;  Location: [facility];  Service: Orthopedics;  Laterality: Left;  with block   TOTAL KNEE ARTHROPLASTY Right 04/30/2020   Procedure: RIGHT TOTAL KNEE ARTHROPLASTY;  Surgeon: Ollen Gross, MD;  Location: [facility];  Service: Orthopedics;  Laterality: Right;    VAGINAL HYSTERECTOMY  age 4s    REVIEW OF SYSTEMS:  A comprehensive review of systems was negative.   PHYSICAL EXAMINATION: General appearance: alert, cooperative, and no distress Head: Normocephalic, without obvious abnormality, atraumatic Neck: no adenopathy, no JVD, supple, symmetrical, trachea midline, and thyroid not enlarged, symmetric, no tenderness/mass/nodules Lymph nodes: Cervical, supraclavicular, and axillary nodes normal. Resp: clear to auscultation bilaterally Back: symmetric, no curvature. ROM normal. No CVA tenderness. Cardio: regular rate and rhythm, S1, S2 normal, no murmur, click, rub or gallop GI: soft, non-tender; bowel sounds normal; no masses,  no organomegaly Extremities: extremities normal, atraumatic, no cyanosis or edema  ECOG PERFORMANCE STATUS: 1 - Symptomatic but completely ambulatory  Blood pressure 133/68, pulse 66, temperature 97.8 F (36.6 C), temperature source Oral, resp. rate 17, height 5\' 5"  (1.651 m), weight 170 lb 9.6 oz (77.4 kg), SpO2 100%.  LABORATORY  DATA: Lab Results  Component Value Date   WBC 2.9 (L) 09/26/2023   HGB 13.5 09/26/2023   HCT 38.9 09/26/2023   MCV 88.0 09/26/2023   PLT 252 09/26/2023      Chemistry      Component Value Date/Time   NA 139 09/26/2023 0908   NA 139 10/19/2022 1030   K 3.3 (L) 09/26/2023 0908   CL 105 09/26/2023 0908   CO2 27 09/26/2023 0908   BUN 11 09/26/2023 0908   BUN 13 10/19/2022 1030   CREATININE 0.83 09/26/2023 0908      Component Value Date/Time   CALCIUM 9.7 09/26/2023 0908   ALKPHOS 66 09/26/2023 0908   AST 21 09/26/2023 0908   ALT 12 09/26/2023 0908   BILITOT 0.6 09/26/2023 0908       RADIOGRAPHIC STUDIES: DG Chest Port 1 View  Result Date: 08/30/2023 CLINICAL DATA:  Status post bronchoscopy EXAM: PORTABLE CHEST 1 VIEW COMPARISON:  Chest CT 06/25/2023.  Chest x-ray 06/25/2023. FINDINGS: There some new strandy and patchy opacities in both lung bases. There is no pleural effusion or pneumothorax. The cardiomediastinal silhouette is stable and within normal limits. No acute fractures are seen. IMPRESSION: New  strandy and patchy opacities in both lung bases. No pneumothorax. Electronically Signed   By: Darliss Cheney M.D.   On: 08/30/2023 16:20   DG C-ARM BRONCHOSCOPY  Result Date: 08/30/2023 C-ARM BRONCHOSCOPY: Fluoroscopy was utilized by the requesting physician.  No radiographic interpretation.    ASSESSMENT AND PLAN: This is a very pleasant 82 years old African-American female diagnosed with Stage IIIC (T3, N3, M0) non-small cell lung cancer, adenocarcinoma presented with supraclavicular lymphadenopathy, 2 left lung nodules diagnosed in September 2024.  There is also a short segment of wall thickening involving the splenic flexure of the colon with associated mild hypermetabolism which is nonspecific and colonoscopy was recommended.  Molecular studies showed positive KRAS G12C mutation and PD-L1 expression of 5%. The patient is currently undergoing a course of concurrent  chemoradiation with weekly carboplatin for AUC of 2 and paclitaxel 45 Mg/M2 started on September 20, 2023.  She is status post 1 week of treatment.    Stage 3C Non-Small Cell Lung Cancer (Adenocarcinoma) Tolerating first week of concurrent chemoradiation well. No reported side effects from chemotherapy or radiation. Noted left shoulder pain, possibly related to radiation or physical activity. -Continue weekly Carboplatin and Paclitaxel. -Continue radiation therapy. -Monitor for potential radiation side effects.  Weight Maintenance No unexpected weight loss reported. Importance of maintaining weight during treatment emphasized. -Encourage adequate nutritional intake to prevent weight loss during treatment.  Follow-up Regular monitoring during treatment course. -Next appointment in 2 weeks, continue weekly treatments.   The patient was advised to call immediately if she has any other concerning symptoms in the interval. The patient voices understanding of current disease status and treatment options and is in agreement with the current care plan.  All questions were answered. The patient knows to call the clinic with any problems, questions or concerns. We can certainly see the patient much sooner if necessary.  The total time spent in the appointment was 20 minutes.  Disclaimer: This note was dictated with voice recognition software. Similar sounding words can inadvertently be transcribed and may not be corrected upon review.

## 2023-09-26 NOTE — Patient Instructions (Signed)
[facility]  Discharge Instructions: Thank you for choosing [facility] to provide your oncology and hematology care.   If you have a lab appointment with the Cancer Center, please go directly to the Cancer Center and check in at the registration area.   Wear comfortable clothing and clothing appropriate for easy access to any Portacath or PICC line.   We strive to give you quality time with your provider. You may need to reschedule your appointment if you arrive late (15 or more minutes).  Arriving late affects you and other patients whose appointments are after yours.  Also, if you miss three or more appointments without notifying the office, you may be dismissed from the clinic at the provider's discretion.      For prescription refill requests, have your pharmacy contact our office and allow 72 hours for refills to be completed.    Today you received the following chemotherapy and/or immunotherapy agents - Taxol/Carboplatin      To help prevent nausea and vomiting after your treatment, we encourage you to take your nausea medication as directed.  BELOW ARE SYMPTOMS THAT SHOULD BE REPORTED IMMEDIATELY: *FEVER GREATER THAN 100.4 F (38 C) OR HIGHER *CHILLS OR SWEATING *NAUSEA AND VOMITING THAT IS NOT CONTROLLED WITH YOUR NAUSEA MEDICATION *UNUSUAL SHORTNESS OF BREATH *UNUSUAL BRUISING OR BLEEDING *URINARY PROBLEMS (pain or burning when urinating, or frequent urination) *BOWEL PROBLEMS (unusual diarrhea, constipation, pain near the anus) TENDERNESS IN MOUTH AND THROAT WITH OR WITHOUT PRESENCE OF ULCERS (sore throat, sores in mouth, or a toothache) UNUSUAL RASH, SWELLING OR PAIN  UNUSUAL VAGINAL DISCHARGE OR ITCHING   Items with * indicate a potential emergency and should be followed up as soon as possible or go to the Emergency Department if any problems should occur.  Please show the CHEMOTHERAPY ALERT CARD or IMMUNOTHERAPY ALERT CARD  at check-in to the Emergency Department and triage nurse.  Should you have questions after your visit or need to cancel or reschedule your appointment, please contact [facility]  Dept: [phone]  and follow the prompts.  Office hours are 8:00 a.m. to 4:30 p.m. Monday - Friday. Please note that voicemails left after 4:00 p.m. may not be returned until the following business day.  We are closed weekends and major holidays. You have access to a nurse at all times for urgent questions. Please call the main number to the clinic Dept: [phone] and follow the prompts.   For any non-urgent questions, you may also contact your provider using MyChart. We now offer e-Visits for anyone 18 and older to request care online for non-urgent symptoms. For details visit [email or web address].   Also download the MyChart app! Go to the app store, search "MyChart", open the app, select [facility], and log in with your MyChart username and password.  

## 2023-09-27 ENCOUNTER — Ambulatory Visit
Admission: RE | Admit: 2023-09-27 | Discharge: 2023-09-27 | Discharge status: Home / Self Care | Payer: Medicare Other | Source: Ambulatory Visit | Attending: Radiation Oncology

## 2023-09-27 ENCOUNTER — Encounter: Payer: Self-pay | Admitting: Internal Medicine

## 2023-09-27 ENCOUNTER — Other Ambulatory Visit: Payer: Self-pay

## 2023-09-27 DIAGNOSIS — R29818 Other symptoms and signs involving the nervous system: Secondary | ICD-10-CM | POA: Diagnosis not present

## 2023-09-27 DIAGNOSIS — I252 Old myocardial infarction: Secondary | ICD-10-CM | POA: Diagnosis not present

## 2023-09-27 DIAGNOSIS — C3482 Malignant neoplasm of overlapping sites of left bronchus and lung: Secondary | ICD-10-CM | POA: Diagnosis not present

## 2023-09-27 DIAGNOSIS — Z87891 Personal history of nicotine dependence: Secondary | ICD-10-CM | POA: Diagnosis not present

## 2023-09-27 DIAGNOSIS — M79602 Pain in left arm: Secondary | ICD-10-CM | POA: Diagnosis not present

## 2023-09-27 DIAGNOSIS — Z5111 Encounter for antineoplastic chemotherapy: Secondary | ICD-10-CM | POA: Diagnosis not present

## 2023-09-27 DIAGNOSIS — Z510 Encounter for antineoplastic radiation therapy: Secondary | ICD-10-CM | POA: Diagnosis not present

## 2023-09-27 LAB — RAD ONC ARIA SESSION SUMMARY
Course Elapsed Days: 7
Plan Fractions Treated to Date: 6
Plan Prescribed Dose Per Fraction: 2 Gy
Plan Total Fractions Prescribed: 30
Plan Total Prescribed Dose: 60 Gy
Reference Point Dosage Given to Date: 12 Gy
Reference Point Session Dosage Given: 2 Gy
Session Number: 6

## 2023-09-27 NOTE — Progress Notes (Signed)
Reviewed at last office visit.   Thanks,  BLI  Josephine Igo, DO  Pulmonary Critical Care 09/27/2023 3:17 PM

## 2023-09-28 ENCOUNTER — Ambulatory Visit
Admission: RE | Admit: 2023-09-28 | Discharge: 2023-09-28 | Disposition: A | Discharge status: Home / Self Care | Payer: Medicare Other | Source: Ambulatory Visit | Attending: Radiation Oncology

## 2023-09-28 ENCOUNTER — Other Ambulatory Visit: Payer: Self-pay

## 2023-09-28 DIAGNOSIS — M79602 Pain in left arm: Secondary | ICD-10-CM | POA: Diagnosis not present

## 2023-09-28 DIAGNOSIS — C3482 Malignant neoplasm of overlapping sites of left bronchus and lung: Secondary | ICD-10-CM | POA: Diagnosis not present

## 2023-09-28 DIAGNOSIS — Z510 Encounter for antineoplastic radiation therapy: Secondary | ICD-10-CM | POA: Diagnosis not present

## 2023-09-28 DIAGNOSIS — Z5111 Encounter for antineoplastic chemotherapy: Secondary | ICD-10-CM | POA: Diagnosis not present

## 2023-09-28 DIAGNOSIS — R29818 Other symptoms and signs involving the nervous system: Secondary | ICD-10-CM | POA: Diagnosis not present

## 2023-09-28 DIAGNOSIS — Z87891 Personal history of nicotine dependence: Secondary | ICD-10-CM | POA: Diagnosis not present

## 2023-09-28 DIAGNOSIS — I252 Old myocardial infarction: Secondary | ICD-10-CM | POA: Diagnosis not present

## 2023-09-28 LAB — RAD ONC ARIA SESSION SUMMARY
Course Elapsed Days: 8
Plan Fractions Treated to Date: 7
Plan Prescribed Dose Per Fraction: 2 Gy
Plan Total Fractions Prescribed: 30
Plan Total Prescribed Dose: 60 Gy
Reference Point Dosage Given to Date: 14 Gy
Reference Point Session Dosage Given: 2 Gy
Session Number: 7

## 2023-09-29 ENCOUNTER — Ambulatory Visit
Admission: RE | Admit: 2023-09-29 | Discharge: 2023-09-29 | Disposition: A | Discharge status: Home / Self Care | Payer: Medicare Other | Source: Ambulatory Visit | Attending: Radiation Oncology | Admitting: Radiation Oncology

## 2023-09-29 ENCOUNTER — Other Ambulatory Visit: Payer: Self-pay

## 2023-09-29 DIAGNOSIS — Z5111 Encounter for antineoplastic chemotherapy: Secondary | ICD-10-CM | POA: Diagnosis not present

## 2023-09-29 DIAGNOSIS — Z510 Encounter for antineoplastic radiation therapy: Secondary | ICD-10-CM | POA: Diagnosis not present

## 2023-09-29 DIAGNOSIS — R29818 Other symptoms and signs involving the nervous system: Secondary | ICD-10-CM | POA: Diagnosis not present

## 2023-09-29 DIAGNOSIS — I252 Old myocardial infarction: Secondary | ICD-10-CM | POA: Diagnosis not present

## 2023-09-29 DIAGNOSIS — C3482 Malignant neoplasm of overlapping sites of left bronchus and lung: Secondary | ICD-10-CM | POA: Diagnosis not present

## 2023-09-29 DIAGNOSIS — M79602 Pain in left arm: Secondary | ICD-10-CM | POA: Diagnosis not present

## 2023-09-29 DIAGNOSIS — Z87891 Personal history of nicotine dependence: Secondary | ICD-10-CM | POA: Diagnosis not present

## 2023-09-29 LAB — RAD ONC ARIA SESSION SUMMARY
Course Elapsed Days: 9
Plan Fractions Treated to Date: 8
Plan Prescribed Dose Per Fraction: 2 Gy
Plan Total Fractions Prescribed: 30
Plan Total Prescribed Dose: 60 Gy
Reference Point Dosage Given to Date: 16 Gy
Reference Point Session Dosage Given: 2 Gy
Session Number: 8

## 2023-09-30 ENCOUNTER — Ambulatory Visit
Admission: RE | Admit: 2023-09-30 | Discharge: 2023-09-30 | Disposition: A | Discharge status: Home / Self Care | Payer: Medicare Other | Source: Ambulatory Visit | Attending: Radiation Oncology | Admitting: Radiation Oncology

## 2023-09-30 ENCOUNTER — Ambulatory Visit
Admission: RE | Admit: 2023-09-30 | Discharge: 2023-09-30 | Disposition: A | Discharge status: Home / Self Care | Payer: Medicare Other | Source: Ambulatory Visit | Attending: Radiation Oncology

## 2023-09-30 ENCOUNTER — Other Ambulatory Visit: Payer: Self-pay

## 2023-09-30 DIAGNOSIS — Z87891 Personal history of nicotine dependence: Secondary | ICD-10-CM | POA: Diagnosis not present

## 2023-09-30 DIAGNOSIS — Z5111 Encounter for antineoplastic chemotherapy: Secondary | ICD-10-CM | POA: Diagnosis not present

## 2023-09-30 DIAGNOSIS — R29818 Other symptoms and signs involving the nervous system: Secondary | ICD-10-CM | POA: Diagnosis not present

## 2023-09-30 DIAGNOSIS — M79602 Pain in left arm: Secondary | ICD-10-CM | POA: Diagnosis not present

## 2023-09-30 DIAGNOSIS — I252 Old myocardial infarction: Secondary | ICD-10-CM | POA: Diagnosis not present

## 2023-09-30 DIAGNOSIS — Z510 Encounter for antineoplastic radiation therapy: Secondary | ICD-10-CM | POA: Diagnosis not present

## 2023-09-30 DIAGNOSIS — C3482 Malignant neoplasm of overlapping sites of left bronchus and lung: Secondary | ICD-10-CM | POA: Diagnosis not present

## 2023-09-30 LAB — RAD ONC ARIA SESSION SUMMARY
Course Elapsed Days: 10
Plan Fractions Treated to Date: 9
Plan Prescribed Dose Per Fraction: 2 Gy
Plan Total Fractions Prescribed: 30
Plan Total Prescribed Dose: 60 Gy
Reference Point Dosage Given to Date: 18 Gy
Reference Point Session Dosage Given: 2 Gy
Session Number: 9

## 2023-10-03 ENCOUNTER — Other Ambulatory Visit: Payer: Self-pay

## 2023-10-03 ENCOUNTER — Ambulatory Visit
Admission: RE | Admit: 2023-10-03 | Discharge: 2023-10-03 | Disposition: A | Discharge status: Home / Self Care | Payer: Medicare Other | Source: Ambulatory Visit | Attending: Radiation Oncology | Admitting: Radiation Oncology

## 2023-10-03 DIAGNOSIS — Z5111 Encounter for antineoplastic chemotherapy: Secondary | ICD-10-CM | POA: Diagnosis not present

## 2023-10-03 DIAGNOSIS — C3482 Malignant neoplasm of overlapping sites of left bronchus and lung: Secondary | ICD-10-CM | POA: Diagnosis not present

## 2023-10-03 DIAGNOSIS — Z510 Encounter for antineoplastic radiation therapy: Secondary | ICD-10-CM | POA: Diagnosis not present

## 2023-10-03 DIAGNOSIS — I252 Old myocardial infarction: Secondary | ICD-10-CM | POA: Diagnosis not present

## 2023-10-03 DIAGNOSIS — Z87891 Personal history of nicotine dependence: Secondary | ICD-10-CM | POA: Diagnosis not present

## 2023-10-03 DIAGNOSIS — M79602 Pain in left arm: Secondary | ICD-10-CM | POA: Diagnosis not present

## 2023-10-03 DIAGNOSIS — R29818 Other symptoms and signs involving the nervous system: Secondary | ICD-10-CM | POA: Diagnosis not present

## 2023-10-03 LAB — RAD ONC ARIA SESSION SUMMARY
Course Elapsed Days: 13
Plan Fractions Treated to Date: 10
Plan Prescribed Dose Per Fraction: 2 Gy
Plan Total Fractions Prescribed: 30
Plan Total Prescribed Dose: 60 Gy
Reference Point Dosage Given to Date: 20 Gy
Reference Point Session Dosage Given: 2 Gy
Session Number: 10

## 2023-10-04 ENCOUNTER — Encounter: Payer: Self-pay | Admitting: Internal Medicine

## 2023-10-04 ENCOUNTER — Ambulatory Visit
Admission: RE | Admit: 2023-10-04 | Discharge: 2023-10-04 | Disposition: A | Discharge status: Home / Self Care | Payer: Medicare Other | Source: Ambulatory Visit | Attending: Radiation Oncology | Admitting: Radiation Oncology

## 2023-10-04 ENCOUNTER — Other Ambulatory Visit: Payer: Self-pay

## 2023-10-04 ENCOUNTER — Inpatient Hospital Stay: Payer: Medicare Other

## 2023-10-04 VITALS — BP 137/70 | HR 75 | Temp 97.8°F | Resp 18 | Ht 65.0 in | Wt 172.1 lb

## 2023-10-04 DIAGNOSIS — Z87891 Personal history of nicotine dependence: Secondary | ICD-10-CM | POA: Diagnosis not present

## 2023-10-04 DIAGNOSIS — R29818 Other symptoms and signs involving the nervous system: Secondary | ICD-10-CM | POA: Diagnosis not present

## 2023-10-04 DIAGNOSIS — C3482 Malignant neoplasm of overlapping sites of left bronchus and lung: Secondary | ICD-10-CM | POA: Diagnosis not present

## 2023-10-04 DIAGNOSIS — I252 Old myocardial infarction: Secondary | ICD-10-CM | POA: Diagnosis not present

## 2023-10-04 DIAGNOSIS — Z510 Encounter for antineoplastic radiation therapy: Secondary | ICD-10-CM | POA: Diagnosis not present

## 2023-10-04 DIAGNOSIS — C3412 Malignant neoplasm of upper lobe, left bronchus or lung: Secondary | ICD-10-CM

## 2023-10-04 DIAGNOSIS — Z5111 Encounter for antineoplastic chemotherapy: Secondary | ICD-10-CM | POA: Diagnosis not present

## 2023-10-04 DIAGNOSIS — M79602 Pain in left arm: Secondary | ICD-10-CM | POA: Diagnosis not present

## 2023-10-04 LAB — CMP (CANCER CENTER ONLY)
ALT: 14 U/L (ref 0–44)
AST: 24 U/L (ref 15–41)
Albumin: 3.8 g/dL (ref 3.5–5.0)
Alkaline Phosphatase: 72 U/L (ref 38–126)
Anion gap: 8 (ref 5–15)
BUN: 9 mg/dL (ref 8–23)
CO2: 26 mmol/L (ref 22–32)
Calcium: 10.2 mg/dL (ref 8.9–10.3)
Chloride: 104 mmol/L (ref 98–111)
Creatinine: 0.78 mg/dL (ref 0.44–1.00)
GFR, Estimated: >60 mL/min (ref >60)
Glucose, Bld: 90 mg/dL (ref 70–99)
Potassium: 4.1 mmol/L (ref 3.5–5.1)
Sodium: 138 mmol/L (ref 135–145)
Total Bilirubin: 0.4 mg/dL (ref 0.3–1.2)
Total Protein: 6.7 g/dL (ref 6.5–8.1)

## 2023-10-04 LAB — RAD ONC ARIA SESSION SUMMARY
Course Elapsed Days: 14
Plan Fractions Treated to Date: 11
Plan Prescribed Dose Per Fraction: 2 Gy
Plan Total Fractions Prescribed: 30
Plan Total Prescribed Dose: 60 Gy
Reference Point Dosage Given to Date: 22 Gy
Reference Point Session Dosage Given: 2 Gy
Session Number: 11

## 2023-10-04 LAB — CBC WITH DIFFERENTIAL (CANCER CENTER ONLY)
Abs Immature Granulocytes: 0.02 10*3/uL (ref 0.00–0.07)
Basophils Absolute: 0 10*3/uL (ref 0.0–0.1)
Basophils Relative: 0 %
Eosinophils Absolute: 0.1 10*3/uL (ref 0.0–0.5)
Eosinophils Relative: 2 %
HCT: 38.8 % (ref 36.0–46.0)
Hemoglobin: 13 g/dL (ref 12.0–15.0)
Immature Granulocytes: 1 %
Lymphocytes Relative: 28 %
Lymphs Abs: 0.7 10*3/uL (ref 0.7–4.0)
MCH: 29.7 pg (ref 26.0–34.0)
MCHC: 33.5 g/dL (ref 30.0–36.0)
MCV: 88.6 fL (ref 80.0–100.0)
Monocytes Absolute: 0.3 10*3/uL (ref 0.1–1.0)
Monocytes Relative: 14 %
Neutro Abs: 1.4 10*3/uL — ABNORMAL LOW (ref 1.7–7.7)
Neutrophils Relative %: 55 %
Platelet Count: 239 10*3/uL (ref 150–400)
RBC: 4.38 MIL/uL (ref 3.87–5.11)
RDW: 13.6 % (ref 11.5–15.5)
WBC Count: 2.5 10*3/uL — ABNORMAL LOW (ref 4.0–10.5)
nRBC: 0 % (ref 0.0–0.2)

## 2023-10-04 MED ORDER — SODIUM CHLORIDE 0.9 % IV SOLN
45.0000 mg/m2 | Freq: Once | INTRAVENOUS | Status: AC
  Administered 2023-10-04: 84 mg via INTRAVENOUS
  Filled 2023-10-04: qty 14

## 2023-10-04 MED ORDER — PALONOSETRON HCL INJECTION 0.25 MG/5ML
0.2500 mg | Freq: Once | INTRAVENOUS | Status: AC
  Administered 2023-10-04: 0.25 mg via INTRAVENOUS
  Filled 2023-10-04: qty 5

## 2023-10-04 MED ORDER — SODIUM CHLORIDE 0.9 % IV SOLN: Freq: Once | INTRAVENOUS | Status: AC

## 2023-10-04 MED ORDER — SODIUM CHLORIDE 0.9% FLUSH: 10.0000 mL | INTRAVENOUS | Status: DC | PRN

## 2023-10-04 MED ORDER — CETIRIZINE HCL 10 MG/ML IV SOLN
10.0000 mg | Freq: Once | INTRAVENOUS | Status: DC
Start: 2023-10-04 — End: 2023-10-04
  Filled 2023-10-04: qty 1

## 2023-10-04 MED ORDER — SODIUM CHLORIDE 0.9 % IV SOLN
161.8000 mg | Freq: Once | INTRAVENOUS | Status: AC
  Administered 2023-10-04: 160 mg via INTRAVENOUS
  Filled 2023-10-04: qty 16

## 2023-10-04 MED ORDER — FAMOTIDINE IN NACL 20-0.9 MG/50ML-% IV SOLN
20.0000 mg | Freq: Once | INTRAVENOUS | Status: AC
  Administered 2023-10-04: 20 mg via INTRAVENOUS
  Filled 2023-10-04: qty 50

## 2023-10-04 MED ORDER — DEXAMETHASONE SODIUM PHOSPHATE 10 MG/ML IJ SOLN
10.0000 mg | Freq: Once | INTRAMUSCULAR | Status: AC
  Administered 2023-10-04: 10 mg via INTRAVENOUS
  Filled 2023-10-04: qty 1

## 2023-10-04 NOTE — Progress Notes (Signed)
Ok to treat patient with ANC 1.4 and WBC 2.5 per Dr. Arbutus Ped.

## 2023-10-04 NOTE — Patient Instructions (Signed)
[facility]  Discharge Instructions: Thank you for choosing [facility] to provide your oncology and hematology care.   If you have a lab appointment with the Cancer Center, please go directly to the Cancer Center and check in at the registration area.   Wear comfortable clothing and clothing appropriate for easy access to any Portacath or PICC line.   We strive to give you quality time with your provider. You may need to reschedule your appointment if you arrive late (15 or more minutes).  Arriving late affects you and other patients whose appointments are after yours.  Also, if you miss three or more appointments without notifying the office, you may be dismissed from the clinic at the provider's discretion.      For prescription refill requests, have your pharmacy contact our office and allow 72 hours for refills to be completed.    Today you received the following chemotherapy and/or immunotherapy agents - Taxol/Carboplatin      To help prevent nausea and vomiting after your treatment, we encourage you to take your nausea medication as directed.  BELOW ARE SYMPTOMS THAT SHOULD BE REPORTED IMMEDIATELY: *FEVER GREATER THAN 100.4 F (38 C) OR HIGHER *CHILLS OR SWEATING *NAUSEA AND VOMITING THAT IS NOT CONTROLLED WITH YOUR NAUSEA MEDICATION *UNUSUAL SHORTNESS OF BREATH *UNUSUAL BRUISING OR BLEEDING *URINARY PROBLEMS (pain or burning when urinating, or frequent urination) *BOWEL PROBLEMS (unusual diarrhea, constipation, pain near the anus) TENDERNESS IN MOUTH AND THROAT WITH OR WITHOUT PRESENCE OF ULCERS (sore throat, sores in mouth, or a toothache) UNUSUAL RASH, SWELLING OR PAIN  UNUSUAL VAGINAL DISCHARGE OR ITCHING   Items with * indicate a potential emergency and should be followed up as soon as possible or go to the Emergency Department if any problems should occur.  Please show the CHEMOTHERAPY ALERT CARD or IMMUNOTHERAPY ALERT CARD  at check-in to the Emergency Department and triage nurse.  Should you have questions after your visit or need to cancel or reschedule your appointment, please contact [facility]  Dept: [phone]  and follow the prompts.  Office hours are 8:00 a.m. to 4:30 p.m. Monday - Friday. Please note that voicemails left after 4:00 p.m. may not be returned until the following business day.  We are closed weekends and major holidays. You have access to a nurse at all times for urgent questions. Please call the main number to the clinic Dept: [phone] and follow the prompts.   For any non-urgent questions, you may also contact your provider using MyChart. We now offer e-Visits for anyone 18 and older to request care online for non-urgent symptoms. For details visit [email or web address].   Also download the MyChart app! Go to the app store, search "MyChart", open the app, select [facility], and log in with your MyChart username and password.  

## 2023-10-04 NOTE — Progress Notes (Signed)
Per Dr. Julien Nordmann ,it is ok to treat pt today with Carboplatin and taxol and ANC 1.4.

## 2023-10-05 ENCOUNTER — Other Ambulatory Visit: Payer: Self-pay

## 2023-10-05 ENCOUNTER — Ambulatory Visit
Admission: RE | Admit: 2023-10-05 | Discharge: 2023-10-05 | Discharge status: Home / Self Care | Payer: Medicare Other | Source: Ambulatory Visit | Attending: Radiation Oncology

## 2023-10-05 DIAGNOSIS — C3482 Malignant neoplasm of overlapping sites of left bronchus and lung: Secondary | ICD-10-CM | POA: Diagnosis not present

## 2023-10-05 DIAGNOSIS — R29818 Other symptoms and signs involving the nervous system: Secondary | ICD-10-CM | POA: Diagnosis not present

## 2023-10-05 DIAGNOSIS — Z510 Encounter for antineoplastic radiation therapy: Secondary | ICD-10-CM | POA: Diagnosis not present

## 2023-10-05 DIAGNOSIS — M79602 Pain in left arm: Secondary | ICD-10-CM | POA: Diagnosis not present

## 2023-10-05 DIAGNOSIS — Z5111 Encounter for antineoplastic chemotherapy: Secondary | ICD-10-CM | POA: Diagnosis not present

## 2023-10-05 DIAGNOSIS — I252 Old myocardial infarction: Secondary | ICD-10-CM | POA: Diagnosis not present

## 2023-10-05 DIAGNOSIS — Z87891 Personal history of nicotine dependence: Secondary | ICD-10-CM | POA: Diagnosis not present

## 2023-10-05 LAB — RAD ONC ARIA SESSION SUMMARY
Course Elapsed Days: 15
Plan Fractions Treated to Date: 12
Plan Prescribed Dose Per Fraction: 2 Gy
Plan Total Fractions Prescribed: 30
Plan Total Prescribed Dose: 60 Gy
Reference Point Dosage Given to Date: 24 Gy
Reference Point Session Dosage Given: 2 Gy
Session Number: 12

## 2023-10-06 ENCOUNTER — Ambulatory Visit
Admission: RE | Admit: 2023-10-06 | Discharge: 2023-10-06 | Disposition: A | Discharge status: Home / Self Care | Payer: Medicare Other | Source: Ambulatory Visit | Attending: Radiation Oncology | Admitting: Radiation Oncology

## 2023-10-06 ENCOUNTER — Other Ambulatory Visit: Payer: Self-pay

## 2023-10-06 DIAGNOSIS — I252 Old myocardial infarction: Secondary | ICD-10-CM | POA: Diagnosis not present

## 2023-10-06 DIAGNOSIS — Z87891 Personal history of nicotine dependence: Secondary | ICD-10-CM | POA: Diagnosis not present

## 2023-10-06 DIAGNOSIS — R29818 Other symptoms and signs involving the nervous system: Secondary | ICD-10-CM | POA: Diagnosis not present

## 2023-10-06 DIAGNOSIS — Z5111 Encounter for antineoplastic chemotherapy: Secondary | ICD-10-CM | POA: Diagnosis not present

## 2023-10-06 DIAGNOSIS — Z510 Encounter for antineoplastic radiation therapy: Secondary | ICD-10-CM | POA: Diagnosis not present

## 2023-10-06 DIAGNOSIS — C3482 Malignant neoplasm of overlapping sites of left bronchus and lung: Secondary | ICD-10-CM | POA: Diagnosis not present

## 2023-10-06 DIAGNOSIS — M79602 Pain in left arm: Secondary | ICD-10-CM | POA: Diagnosis not present

## 2023-10-06 LAB — RAD ONC ARIA SESSION SUMMARY
Course Elapsed Days: 16
Plan Fractions Treated to Date: 13
Plan Prescribed Dose Per Fraction: 2 Gy
Plan Total Fractions Prescribed: 30
Plan Total Prescribed Dose: 60 Gy
Reference Point Dosage Given to Date: 26 Gy
Reference Point Session Dosage Given: 2 Gy
Session Number: 13

## 2023-10-07 ENCOUNTER — Ambulatory Visit
Admission: RE | Admit: 2023-10-07 | Discharge: 2023-10-07 | Disposition: A | Discharge status: Home / Self Care | Payer: Medicare Other | Source: Ambulatory Visit | Attending: Radiation Oncology | Admitting: Radiation Oncology

## 2023-10-07 ENCOUNTER — Other Ambulatory Visit: Payer: Self-pay

## 2023-10-07 ENCOUNTER — Telehealth: Payer: Self-pay | Admitting: *Deleted

## 2023-10-07 DIAGNOSIS — C3482 Malignant neoplasm of overlapping sites of left bronchus and lung: Secondary | ICD-10-CM | POA: Insufficient documentation

## 2023-10-07 DIAGNOSIS — Z9049 Acquired absence of other specified parts of digestive tract: Secondary | ICD-10-CM | POA: Insufficient documentation

## 2023-10-07 DIAGNOSIS — Z87891 Personal history of nicotine dependence: Secondary | ICD-10-CM | POA: Diagnosis not present

## 2023-10-07 DIAGNOSIS — Z90710 Acquired absence of both cervix and uterus: Secondary | ICD-10-CM | POA: Insufficient documentation

## 2023-10-07 DIAGNOSIS — R29818 Other symptoms and signs involving the nervous system: Secondary | ICD-10-CM | POA: Insufficient documentation

## 2023-10-07 DIAGNOSIS — Z510 Encounter for antineoplastic radiation therapy: Secondary | ICD-10-CM | POA: Insufficient documentation

## 2023-10-07 DIAGNOSIS — Z79899 Other long term (current) drug therapy: Secondary | ICD-10-CM | POA: Diagnosis not present

## 2023-10-07 DIAGNOSIS — Z79630 Long term (current) use of alkylating agent: Secondary | ICD-10-CM | POA: Insufficient documentation

## 2023-10-07 DIAGNOSIS — Z79633 Long term (current) use of mitotic inhibitor: Secondary | ICD-10-CM | POA: Diagnosis not present

## 2023-10-07 DIAGNOSIS — Z5111 Encounter for antineoplastic chemotherapy: Secondary | ICD-10-CM | POA: Insufficient documentation

## 2023-10-07 DIAGNOSIS — I252 Old myocardial infarction: Secondary | ICD-10-CM | POA: Diagnosis not present

## 2023-10-07 DIAGNOSIS — M79602 Pain in left arm: Secondary | ICD-10-CM | POA: Diagnosis not present

## 2023-10-07 LAB — RAD ONC ARIA SESSION SUMMARY
Course Elapsed Days: 17
Plan Fractions Treated to Date: 14
Plan Prescribed Dose Per Fraction: 2 Gy
Plan Total Fractions Prescribed: 30
Plan Total Prescribed Dose: 60 Gy
Reference Point Dosage Given to Date: 28 Gy
Reference Point Session Dosage Given: 2 Gy
Session Number: 14

## 2023-10-07 NOTE — Telephone Encounter (Signed)
Received call from pt. She states that she experienced very uncomfortable perineal pain/discomfort with her last chemo. This is likely due to to the IVP Decadron. Advised her of the cause and that no damage is caused by this but we need to give it to her much slower to minimize her discomfort. Advised that I would pass on this information for her next treatments to the infusion staff. Her next treatment is Monday, 10/10/23 Charge nurse notified. Patient Care/Coordination note placed as well

## 2023-10-10 ENCOUNTER — Inpatient Hospital Stay: Payer: Medicare Other | Attending: Physician Assistant

## 2023-10-10 ENCOUNTER — Inpatient Hospital Stay (HOSPITAL_BASED_OUTPATIENT_CLINIC_OR_DEPARTMENT_OTHER): Payer: Medicare Other | Admitting: Internal Medicine

## 2023-10-10 ENCOUNTER — Inpatient Hospital Stay: Payer: Medicare Other

## 2023-10-10 ENCOUNTER — Ambulatory Visit
Admission: RE | Admit: 2023-10-10 | Discharge: 2023-10-10 | Disposition: A | Discharge status: Home / Self Care | Payer: Medicare Other | Source: Ambulatory Visit | Attending: Radiation Oncology

## 2023-10-10 ENCOUNTER — Other Ambulatory Visit: Payer: Self-pay

## 2023-10-10 DIAGNOSIS — C3412 Malignant neoplasm of upper lobe, left bronchus or lung: Secondary | ICD-10-CM | POA: Insufficient documentation

## 2023-10-10 DIAGNOSIS — Z79633 Long term (current) use of mitotic inhibitor: Secondary | ICD-10-CM | POA: Insufficient documentation

## 2023-10-10 DIAGNOSIS — Z9049 Acquired absence of other specified parts of digestive tract: Secondary | ICD-10-CM | POA: Insufficient documentation

## 2023-10-10 DIAGNOSIS — Z5111 Encounter for antineoplastic chemotherapy: Secondary | ICD-10-CM | POA: Insufficient documentation

## 2023-10-10 DIAGNOSIS — D329 Benign neoplasm of meninges, unspecified: Secondary | ICD-10-CM | POA: Insufficient documentation

## 2023-10-10 DIAGNOSIS — R918 Other nonspecific abnormal finding of lung field: Secondary | ICD-10-CM | POA: Insufficient documentation

## 2023-10-10 DIAGNOSIS — Z90710 Acquired absence of both cervix and uterus: Secondary | ICD-10-CM | POA: Insufficient documentation

## 2023-10-10 DIAGNOSIS — Z79899 Other long term (current) drug therapy: Secondary | ICD-10-CM | POA: Insufficient documentation

## 2023-10-10 DIAGNOSIS — I252 Old myocardial infarction: Secondary | ICD-10-CM | POA: Insufficient documentation

## 2023-10-10 DIAGNOSIS — K30 Functional dyspepsia: Secondary | ICD-10-CM | POA: Insufficient documentation

## 2023-10-10 DIAGNOSIS — Z79630 Long term (current) use of alkylating agent: Secondary | ICD-10-CM | POA: Insufficient documentation

## 2023-10-10 DIAGNOSIS — R29818 Other symptoms and signs involving the nervous system: Secondary | ICD-10-CM | POA: Insufficient documentation

## 2023-10-10 DIAGNOSIS — R1310 Dysphagia, unspecified: Secondary | ICD-10-CM | POA: Insufficient documentation

## 2023-10-10 DIAGNOSIS — I10 Essential (primary) hypertension: Secondary | ICD-10-CM | POA: Insufficient documentation

## 2023-10-10 DIAGNOSIS — Z510 Encounter for antineoplastic radiation therapy: Secondary | ICD-10-CM | POA: Diagnosis not present

## 2023-10-10 DIAGNOSIS — D709 Neutropenia, unspecified: Secondary | ICD-10-CM | POA: Insufficient documentation

## 2023-10-10 DIAGNOSIS — M79602 Pain in left arm: Secondary | ICD-10-CM | POA: Diagnosis not present

## 2023-10-10 DIAGNOSIS — C3482 Malignant neoplasm of overlapping sites of left bronchus and lung: Secondary | ICD-10-CM | POA: Diagnosis not present

## 2023-10-10 DIAGNOSIS — Z87891 Personal history of nicotine dependence: Secondary | ICD-10-CM | POA: Diagnosis not present

## 2023-10-10 LAB — RAD ONC ARIA SESSION SUMMARY
Course Elapsed Days: 20
Plan Fractions Treated to Date: 15
Plan Prescribed Dose Per Fraction: 2 Gy
Plan Total Fractions Prescribed: 30
Plan Total Prescribed Dose: 60 Gy
Reference Point Dosage Given to Date: 30 Gy
Reference Point Session Dosage Given: 2 Gy
Session Number: 15

## 2023-10-10 LAB — CMP (CANCER CENTER ONLY)
ALT: 14 U/L (ref 0–44)
AST: 21 U/L (ref 15–41)
Albumin: 3.6 g/dL (ref 3.5–5.0)
Alkaline Phosphatase: 71 U/L (ref 38–126)
Anion gap: 5 (ref 5–15)
BUN: 11 mg/dL (ref 8–23)
CO2: 28 mmol/L (ref 22–32)
Calcium: 9.8 mg/dL (ref 8.9–10.3)
Chloride: 103 mmol/L (ref 98–111)
Creatinine: 0.83 mg/dL (ref 0.44–1.00)
GFR, Estimated: >60 mL/min (ref >60)
Glucose, Bld: 123 mg/dL — ABNORMAL HIGH (ref 70–99)
Potassium: 3.5 mmol/L (ref 3.5–5.1)
Sodium: 136 mmol/L (ref 135–145)
Total Bilirubin: 0.4 mg/dL (ref <1.2)
Total Protein: 6.3 g/dL — ABNORMAL LOW (ref 6.5–8.1)

## 2023-10-10 LAB — CBC WITH DIFFERENTIAL (CANCER CENTER ONLY)
Abs Immature Granulocytes: 0.01 10*3/uL (ref 0.00–0.07)
Basophils Absolute: 0 10*3/uL (ref 0.0–0.1)
Basophils Relative: 1 %
Eosinophils Absolute: 0 10*3/uL (ref 0.0–0.5)
Eosinophils Relative: 2 %
HCT: 37.7 % (ref 36.0–46.0)
Hemoglobin: 12.7 g/dL (ref 12.0–15.0)
Immature Granulocytes: 1 %
Lymphocytes Relative: 22 %
Lymphs Abs: 0.4 10*3/uL — ABNORMAL LOW (ref 0.7–4.0)
MCH: 30 pg (ref 26.0–34.0)
MCHC: 33.7 g/dL (ref 30.0–36.0)
MCV: 88.9 fL (ref 80.0–100.0)
Monocytes Absolute: 0.2 10*3/uL (ref 0.1–1.0)
Monocytes Relative: 11 %
Neutro Abs: 1.1 10*3/uL — ABNORMAL LOW (ref 1.7–7.7)
Neutrophils Relative %: 63 %
Platelet Count: 207 10*3/uL (ref 150–400)
RBC: 4.24 MIL/uL (ref 3.87–5.11)
RDW: 13.5 % (ref 11.5–15.5)
WBC Count: 1.8 10*3/uL — ABNORMAL LOW (ref 4.0–10.5)
nRBC: 0 % (ref 0.0–0.2)

## 2023-10-10 MED ORDER — SUCRALFATE 1 G PO TABS: 1.0000 g | ORAL_TABLET | Freq: Three times a day (TID) | ORAL | 0 refills | Status: AC

## 2023-10-10 NOTE — Progress Notes (Signed)
[facility] [phone]   [phone]  OFFICE PROGRESS NOTE  Emilio Aspen, MD [address] [place] [address]  DIAGNOSIS: Stage IIIC (T3, N3, M0) non-small cell lung cancer, adenocarcinoma presented with supraclavicular lymphadenopathy, 2 left lung nodules diagnosed in September 2024.  There is also a short segment of wall thickening involving the splenic flexure of the colon with associated mild hypermetabolism which is nonspecific and colonoscopy was recommended.   Biomarker Findings HRD signature - HRDsig Negative Microsatellite status - MS-Equivocal ? Tumor Mutational Burden - 2 Muts/Mb Genomic Findings For a complete list of the genes assayed, please refer to the Appendix. KRAS G12C TP53 R110L 7 Disease relevant genes with no reportable alterations: ALK, BRAF, EGFR, ERBB2, MET, RET, ROS1  PDL1 Expression 5%.  PRIOR THERAPY: None   CURRENT THERAPY: Concurrent chemoradiation with carboplatin for an AUC of 2 and taxol 45 mg/m2.  First dose September 20, 2023  INTERVAL HISTORY: Stephanie Newton 82 y.o. female returns to the clinic today for follow-up visit accompanied by her daughter Stephanie Newton.Discussed the use of AI scribe software for clinical note transcription with the patient, who gave verbal consent to proceed.  History of Present Illness   Stephanie Newton, a 70 year old patient with a diagnosis of stage 3C non-small cell lung cancer, has been undergoing weekly carboplatin and paclitaxel chemotherapy and radiation since September 2024. The patient has completed three cycles of this treatment regimen.  The patient reports a sensation of a lump in the throat, which is not associated with pain. This sensation is particularly noticeable after eating, with a feeling of food wanting to return.  The patient expressed concern about this delay, but understands the need for the body to recover before continuing with the  chemotherapy.  The patient also reports suffering from chronic insomnia, which is not related to the cancer treatment. The patient does not sleep much during the day and tries to keep busy.       MEDICAL HISTORY: Past Medical History:  Diagnosis Date   Arthritis    Dysrhythmia    Hypercholesteremia    Hypertension    Myocardial infarction [facility])     ALLERGIES:  is allergic to shellfish allergy.  MEDICATIONS:  Current Outpatient Medications  Medication Sig Dispense Refill   amLODipine (NORVASC) 2.5 MG tablet Take 2.5 mg by mouth daily.     aspirin EC 81 MG tablet Take 1 tablet by mouth daily.     azithromycin (ZITHROMAX) 250 MG tablet Take 2 tablets today, then one tablet each day for the next 6 days. 6 tablet 0   estradiol (ESTRACE) 1 MG tablet Take 1 mg by mouth daily.     famotidine (PEPCID) 20 MG tablet Take 20 mg by mouth as needed.     hydrochlorothiazide (HYDRODIURIL) 12.5 MG tablet Take 12.5 mg by mouth daily.      methocarbamol (ROBAXIN) 500 MG tablet Take 1 tablet (500 mg total) by mouth every 6 (six) hours as needed for muscle spasms. 40 tablet 0   metoprolol succinate (TOPROL-XL) 100 MG 24 hr tablet Take 100 mg by mouth daily.     metoprolol succinate (TOPROL-XL) 50 MG 24 hr tablet Take 1 tablet (50 mg total) by mouth daily. 90 tablet 1   oxyCODONE-acetaminophen (PERCOCET/ROXICET) 5-325 MG tablet Take 1-2 tablets by mouth every 8 (eight) hours as needed for severe pain. 6 tablet 0   prochlorperazine (COMPAZINE) 10 MG tablet Take 1  tablet (10 mg total) by mouth every 6 (six) hours as needed. 30 tablet 2   simvastatin (ZOCOR) 20 MG tablet Take 20 mg by mouth at bedtime.     No current facility-administered medications for this visit.    SURGICAL HISTORY:  Past Surgical History:  Procedure Laterality Date   BREAST EXCISIONAL BIOPSY Right    x2   BRONCHIAL BIOPSY  08/30/2023   Procedure: BRONCHIAL BIOPSIES;  Surgeon: Josephine Igo, DO;  [facility]: [facility];   Service: Pulmonary;;   EYE SURGERY     cataract bilateral    HERNIA REPAIR  06/2000   INCISIONAL VENTRAL ; DR Carman Ching    KNEE ARTHROSCOPY Left 08/23/2017   dr Thomasena Edis    LAPAROSCOPIC CHOLECYSTECTOMY  06/13/2000   DR DOUG [facility]    LEFT HEART CATH AND CORONARY ANGIOGRAPHY N/A 12/04/2018   Procedure: LEFT HEART CATH AND CORONARY ANGIOGRAPHY;  Surgeon: Lyn Records, MD;  [facility];  Service: Cardiovascular;  Laterality: N/A;   TOTAL KNEE ARTHROPLASTY Left 03/31/2018   Procedure: LEFT TOTAL KNEE ARTHROPLASTY;  Surgeon: Eugenia Mcalpine, MD;  Location: [facility];  Service: Orthopedics;  Laterality: Left;  with block   TOTAL KNEE ARTHROPLASTY Right 04/30/2020   Procedure: RIGHT TOTAL KNEE ARTHROPLASTY;  Surgeon: Ollen Gross, MD;  Location: [facility];  Service: Orthopedics;  Laterality: Right;    VAGINAL HYSTERECTOMY  age 61s    REVIEW OF SYSTEMS:  Constitutional: positive for fatigue Eyes: negative Ears, nose, mouth, throat, and face: positive for sore throat Respiratory: negative Cardiovascular: negative Gastrointestinal: negative Genitourinary:negative Integument/breast: negative Hematologic/lymphatic: negative Musculoskeletal:negative Neurological: negative Behavioral/Psych: negative Endocrine: negative Allergic/Immunologic: negative   PHYSICAL EXAMINATION: General appearance: alert, cooperative, and no distress Head: Normocephalic, without obvious abnormality, atraumatic Neck: no adenopathy, no JVD, supple, symmetrical, trachea midline, and thyroid not enlarged, symmetric, no tenderness/mass/nodules Lymph nodes: Cervical, supraclavicular, and axillary nodes normal. Resp: clear to auscultation bilaterally Back: symmetric, no curvature. ROM normal. No CVA tenderness. Cardio: regular rate and rhythm, S1, S2 normal, no murmur, click, rub or gallop GI: soft, non-tender; bowel sounds normal; no masses,  no organomegaly Extremities: extremities normal,  atraumatic, no cyanosis or edema Neurologic: Alert and oriented X 3, normal strength and tone. Normal symmetric reflexes. Normal coordination and gait  ECOG PERFORMANCE STATUS: 1 - Symptomatic but completely ambulatory  Blood pressure 118/75, pulse 69, temperature 97.7 F (36.5 C), temperature source Oral, resp. rate 16, height 5\' 5"  (1.651 m), weight 172 lb 8 oz (78.2 kg), SpO2 100%.  LABORATORY DATA: Lab Results  Component Value Date   WBC 1.8 (L) 10/10/2023   HGB 12.7 10/10/2023   HCT 37.7 10/10/2023   MCV 88.9 10/10/2023   PLT 207 10/10/2023      Chemistry      Component Value Date/Time   NA 138 10/04/2023 1329   NA 139 10/19/2022 1030   K 4.1 10/04/2023 1329   CL 104 10/04/2023 1329   CO2 26 10/04/2023 1329   BUN 9 10/04/2023 1329   BUN 13 10/19/2022 1030   CREATININE 0.78 10/04/2023 1329      Component Value Date/Time   CALCIUM 10.2 10/04/2023 1329   ALKPHOS 72 10/04/2023 1329   AST 24 10/04/2023 1329   ALT 14 10/04/2023 1329   BILITOT 0.4 10/04/2023 1329       RADIOGRAPHIC STUDIES: No results found.  ASSESSMENT AND PLAN: This is a very pleasant 82 years old African-American female diagnosed with Stage IIIC (T3, N3,  M0) non-small cell lung cancer, adenocarcinoma presented with supraclavicular lymphadenopathy, 2 left lung nodules diagnosed in September 2024.  There is also a short segment of wall thickening involving the splenic flexure of the colon with associated mild hypermetabolism which is nonspecific and colonoscopy was recommended.  Molecular studies showed positive KRAS G12C mutation and PD-L1 expression of 5%. The patient is currently undergoing a course of concurrent chemoradiation with weekly carboplatin for AUC of 2 and paclitaxel 45 Mg/M2 started on September 20, 2023.  She is status post 3 week of treatment.    Stage 3C Non-Small Cell Lung Cancer Undergoing weekly carboplatin and paclitaxel with concurrent radiation therapy. Patient has completed 3  cycles so far. Reported new sensation of a lump in the throat and reflux-like symptoms, likely due to radiation therapy. -Continue current chemotherapy and radiation regimen. -Consider starting Carafate to protect esophageal lining, pending radiation oncology's plan. -Continue Pepcid as needed for reflux symptoms.  Neutropenia White blood cell count is low, likely secondary to chemotherapy. Discussed the risks of continuing chemotherapy with low counts (further suppression of bone marrow, risk of infection) versus delaying treatment to allow counts to recover. -Skip chemotherapy this week to allow white blood cell count to recover. -Continue radiation therapy as planned. -Check white blood cell count next week. If still low, consider administering a white blood cell growth factor injection.  Follow-up in 2 weeks, or sooner if white blood cell count remains low next week.   The patient was advised to call immediately if she has any other concerning symptoms in the interval.  The patient voices understanding of current disease status and treatment options and is in agreement with the current care plan.  All questions were answered. The patient knows to call the clinic with any problems, questions or concerns. We can certainly see the patient much sooner if necessary.  The total time spent in the appointment was 30 minutes.  Disclaimer: This note was dictated with voice recognition software. Similar sounding words can inadvertently be transcribed and may not be corrected upon review.

## 2023-10-11 ENCOUNTER — Other Ambulatory Visit: Payer: Self-pay

## 2023-10-11 ENCOUNTER — Ambulatory Visit
Admission: RE | Admit: 2023-10-11 | Discharge: 2023-10-11 | Disposition: A | Discharge status: Home / Self Care | Payer: Medicare Other | Source: Ambulatory Visit | Attending: Radiation Oncology | Admitting: Radiation Oncology

## 2023-10-11 DIAGNOSIS — I252 Old myocardial infarction: Secondary | ICD-10-CM | POA: Diagnosis not present

## 2023-10-11 DIAGNOSIS — Z87891 Personal history of nicotine dependence: Secondary | ICD-10-CM | POA: Diagnosis not present

## 2023-10-11 DIAGNOSIS — Z5111 Encounter for antineoplastic chemotherapy: Secondary | ICD-10-CM | POA: Diagnosis not present

## 2023-10-11 DIAGNOSIS — R29818 Other symptoms and signs involving the nervous system: Secondary | ICD-10-CM | POA: Diagnosis not present

## 2023-10-11 DIAGNOSIS — Z510 Encounter for antineoplastic radiation therapy: Secondary | ICD-10-CM | POA: Diagnosis not present

## 2023-10-11 DIAGNOSIS — M79602 Pain in left arm: Secondary | ICD-10-CM | POA: Diagnosis not present

## 2023-10-11 DIAGNOSIS — C3482 Malignant neoplasm of overlapping sites of left bronchus and lung: Secondary | ICD-10-CM | POA: Diagnosis not present

## 2023-10-11 LAB — RAD ONC ARIA SESSION SUMMARY
Course Elapsed Days: 21
Plan Fractions Treated to Date: 16
Plan Prescribed Dose Per Fraction: 2 Gy
Plan Total Fractions Prescribed: 30
Plan Total Prescribed Dose: 60 Gy
Reference Point Dosage Given to Date: 32 Gy
Reference Point Session Dosage Given: 2 Gy
Session Number: 16

## 2023-10-12 ENCOUNTER — Ambulatory Visit
Admission: RE | Admit: 2023-10-12 | Discharge: 2023-10-12 | Disposition: A | Discharge status: Home / Self Care | Payer: Medicare Other | Source: Ambulatory Visit | Attending: Radiation Oncology

## 2023-10-12 ENCOUNTER — Other Ambulatory Visit: Payer: Self-pay

## 2023-10-12 DIAGNOSIS — M79602 Pain in left arm: Secondary | ICD-10-CM | POA: Diagnosis not present

## 2023-10-12 DIAGNOSIS — Z510 Encounter for antineoplastic radiation therapy: Secondary | ICD-10-CM | POA: Diagnosis not present

## 2023-10-12 DIAGNOSIS — C3482 Malignant neoplasm of overlapping sites of left bronchus and lung: Secondary | ICD-10-CM | POA: Diagnosis not present

## 2023-10-12 DIAGNOSIS — Z5111 Encounter for antineoplastic chemotherapy: Secondary | ICD-10-CM | POA: Diagnosis not present

## 2023-10-12 DIAGNOSIS — I252 Old myocardial infarction: Secondary | ICD-10-CM | POA: Diagnosis not present

## 2023-10-12 DIAGNOSIS — R29818 Other symptoms and signs involving the nervous system: Secondary | ICD-10-CM | POA: Diagnosis not present

## 2023-10-12 DIAGNOSIS — Z87891 Personal history of nicotine dependence: Secondary | ICD-10-CM | POA: Diagnosis not present

## 2023-10-12 LAB — RAD ONC ARIA SESSION SUMMARY
Course Elapsed Days: 22
Plan Fractions Treated to Date: 17
Plan Prescribed Dose Per Fraction: 2 Gy
Plan Total Fractions Prescribed: 30
Plan Total Prescribed Dose: 60 Gy
Reference Point Dosage Given to Date: 34 Gy
Reference Point Session Dosage Given: 2 Gy
Session Number: 17

## 2023-10-13 ENCOUNTER — Other Ambulatory Visit: Payer: Self-pay

## 2023-10-13 ENCOUNTER — Other Ambulatory Visit (HOSPITAL_COMMUNITY): Payer: Medicare Other

## 2023-10-13 ENCOUNTER — Ambulatory Visit
Admission: RE | Admit: 2023-10-13 | Discharge: 2023-10-13 | Disposition: A | Discharge status: Home / Self Care | Payer: Medicare Other | Source: Ambulatory Visit | Attending: Radiation Oncology | Admitting: Radiation Oncology

## 2023-10-13 DIAGNOSIS — Z510 Encounter for antineoplastic radiation therapy: Secondary | ICD-10-CM | POA: Diagnosis not present

## 2023-10-13 DIAGNOSIS — I252 Old myocardial infarction: Secondary | ICD-10-CM | POA: Diagnosis not present

## 2023-10-13 DIAGNOSIS — R29818 Other symptoms and signs involving the nervous system: Secondary | ICD-10-CM | POA: Diagnosis not present

## 2023-10-13 DIAGNOSIS — Z5111 Encounter for antineoplastic chemotherapy: Secondary | ICD-10-CM | POA: Diagnosis not present

## 2023-10-13 DIAGNOSIS — Z87891 Personal history of nicotine dependence: Secondary | ICD-10-CM | POA: Diagnosis not present

## 2023-10-13 DIAGNOSIS — M79602 Pain in left arm: Secondary | ICD-10-CM | POA: Diagnosis not present

## 2023-10-13 DIAGNOSIS — C3482 Malignant neoplasm of overlapping sites of left bronchus and lung: Secondary | ICD-10-CM | POA: Diagnosis not present

## 2023-10-13 LAB — RAD ONC ARIA SESSION SUMMARY
Course Elapsed Days: 23
Plan Fractions Treated to Date: 18
Plan Prescribed Dose Per Fraction: 2 Gy
Plan Total Fractions Prescribed: 30
Plan Total Prescribed Dose: 60 Gy
Reference Point Dosage Given to Date: 36 Gy
Reference Point Session Dosage Given: 2 Gy
Session Number: 18

## 2023-10-14 ENCOUNTER — Ambulatory Visit
Admission: RE | Admit: 2023-10-14 | Discharge: 2023-10-14 | Disposition: A | Discharge status: Home / Self Care | Payer: Medicare Other | Source: Ambulatory Visit | Attending: Radiation Oncology | Admitting: Radiation Oncology

## 2023-10-14 ENCOUNTER — Ambulatory Visit
Admission: RE | Admit: 2023-10-14 | Discharge: 2023-10-14 | Disposition: A | Discharge status: Home / Self Care | Payer: Medicare Other | Source: Ambulatory Visit | Attending: Radiation Oncology

## 2023-10-14 ENCOUNTER — Other Ambulatory Visit: Payer: Self-pay

## 2023-10-14 DIAGNOSIS — Z510 Encounter for antineoplastic radiation therapy: Secondary | ICD-10-CM | POA: Diagnosis not present

## 2023-10-14 DIAGNOSIS — Z5111 Encounter for antineoplastic chemotherapy: Secondary | ICD-10-CM | POA: Diagnosis not present

## 2023-10-14 DIAGNOSIS — I252 Old myocardial infarction: Secondary | ICD-10-CM | POA: Diagnosis not present

## 2023-10-14 DIAGNOSIS — Z87891 Personal history of nicotine dependence: Secondary | ICD-10-CM | POA: Diagnosis not present

## 2023-10-14 DIAGNOSIS — R29818 Other symptoms and signs involving the nervous system: Secondary | ICD-10-CM | POA: Diagnosis not present

## 2023-10-14 DIAGNOSIS — M79602 Pain in left arm: Secondary | ICD-10-CM | POA: Diagnosis not present

## 2023-10-14 DIAGNOSIS — C3482 Malignant neoplasm of overlapping sites of left bronchus and lung: Secondary | ICD-10-CM | POA: Diagnosis not present

## 2023-10-14 LAB — RAD ONC ARIA SESSION SUMMARY
Course Elapsed Days: 24
Plan Fractions Treated to Date: 19
Plan Prescribed Dose Per Fraction: 2 Gy
Plan Total Fractions Prescribed: 30
Plan Total Prescribed Dose: 60 Gy
Reference Point Dosage Given to Date: 38 Gy
Reference Point Session Dosage Given: 2 Gy
Session Number: 19

## 2023-10-15 ENCOUNTER — Telehealth: Payer: Self-pay | Admitting: Internal Medicine

## 2023-10-15 NOTE — Telephone Encounter (Signed)
Called patient regarding upcoming November appointments, patient is notified. 

## 2023-10-17 ENCOUNTER — Inpatient Hospital Stay: Payer: Medicare Other

## 2023-10-17 ENCOUNTER — Other Ambulatory Visit: Payer: Self-pay

## 2023-10-17 ENCOUNTER — Ambulatory Visit
Admission: RE | Admit: 2023-10-17 | Discharge: 2023-10-17 | Disposition: A | Discharge status: Home / Self Care | Payer: Medicare Other | Source: Ambulatory Visit | Attending: Radiation Oncology

## 2023-10-17 VITALS — BP 130/74 | HR 71 | Temp 97.7°F | Resp 20 | Wt 172.0 lb

## 2023-10-17 DIAGNOSIS — Z5111 Encounter for antineoplastic chemotherapy: Secondary | ICD-10-CM | POA: Diagnosis not present

## 2023-10-17 DIAGNOSIS — C3412 Malignant neoplasm of upper lobe, left bronchus or lung: Secondary | ICD-10-CM

## 2023-10-17 DIAGNOSIS — Z87891 Personal history of nicotine dependence: Secondary | ICD-10-CM | POA: Diagnosis not present

## 2023-10-17 DIAGNOSIS — I252 Old myocardial infarction: Secondary | ICD-10-CM | POA: Diagnosis not present

## 2023-10-17 DIAGNOSIS — M79602 Pain in left arm: Secondary | ICD-10-CM | POA: Diagnosis not present

## 2023-10-17 DIAGNOSIS — C3482 Malignant neoplasm of overlapping sites of left bronchus and lung: Secondary | ICD-10-CM | POA: Diagnosis not present

## 2023-10-17 DIAGNOSIS — Z510 Encounter for antineoplastic radiation therapy: Secondary | ICD-10-CM | POA: Diagnosis not present

## 2023-10-17 DIAGNOSIS — R29818 Other symptoms and signs involving the nervous system: Secondary | ICD-10-CM | POA: Diagnosis not present

## 2023-10-17 LAB — CBC WITH DIFFERENTIAL (CANCER CENTER ONLY)
Abs Immature Granulocytes: 0 10*3/uL (ref 0.00–0.07)
Basophils Absolute: 0 10*3/uL (ref 0.0–0.1)
Basophils Relative: 1 %
Eosinophils Absolute: 0.1 10*3/uL (ref 0.0–0.5)
Eosinophils Relative: 3 %
HCT: 38.2 % (ref 36.0–46.0)
Hemoglobin: 13 g/dL (ref 12.0–15.0)
Immature Granulocytes: 0 %
Lymphocytes Relative: 22 %
Lymphs Abs: 0.5 10*3/uL — ABNORMAL LOW (ref 0.7–4.0)
MCH: 30 pg (ref 26.0–34.0)
MCHC: 34 g/dL (ref 30.0–36.0)
MCV: 88.2 fL (ref 80.0–100.0)
Monocytes Absolute: 0.4 10*3/uL (ref 0.1–1.0)
Monocytes Relative: 19 %
Neutro Abs: 1.3 10*3/uL — ABNORMAL LOW (ref 1.7–7.7)
Neutrophils Relative %: 55 %
Platelet Count: 170 10*3/uL (ref 150–400)
RBC: 4.33 MIL/uL (ref 3.87–5.11)
RDW: 14.3 % (ref 11.5–15.5)
WBC Count: 2.3 10*3/uL — ABNORMAL LOW (ref 4.0–10.5)
nRBC: 0 % (ref 0.0–0.2)

## 2023-10-17 LAB — CMP (CANCER CENTER ONLY)
ALT: 16 U/L (ref 0–44)
AST: 27 U/L (ref 15–41)
Albumin: 3.8 g/dL (ref 3.5–5.0)
Alkaline Phosphatase: 70 U/L (ref 38–126)
Anion gap: 7 (ref 5–15)
BUN: 12 mg/dL (ref 8–23)
CO2: 26 mmol/L (ref 22–32)
Calcium: 9.8 mg/dL (ref 8.9–10.3)
Chloride: 104 mmol/L (ref 98–111)
Creatinine: 0.8 mg/dL (ref 0.44–1.00)
GFR, Estimated: >60 mL/min (ref >60)
Glucose, Bld: 92 mg/dL (ref 70–99)
Potassium: 3.3 mmol/L — ABNORMAL LOW (ref 3.5–5.1)
Sodium: 137 mmol/L (ref 135–145)
Total Bilirubin: 0.6 mg/dL (ref <1.2)
Total Protein: 6.7 g/dL (ref 6.5–8.1)

## 2023-10-17 LAB — RAD ONC ARIA SESSION SUMMARY
Course Elapsed Days: 27
Plan Fractions Treated to Date: 20
Plan Prescribed Dose Per Fraction: 2 Gy
Plan Total Fractions Prescribed: 30
Plan Total Prescribed Dose: 60 Gy
Reference Point Dosage Given to Date: 40 Gy
Reference Point Session Dosage Given: 2 Gy
Session Number: 20

## 2023-10-17 MED ORDER — SODIUM CHLORIDE 0.9 % IV SOLN: Freq: Once | INTRAVENOUS | Status: AC

## 2023-10-17 MED ORDER — CARBOPLATIN CHEMO INJECTION 450 MG/45ML
161.8000 mg | Freq: Once | INTRAVENOUS | Status: AC
  Administered 2023-10-17: 160 mg via INTRAVENOUS
  Filled 2023-10-17: qty 16

## 2023-10-17 MED ORDER — DEXAMETHASONE SODIUM PHOSPHATE 10 MG/ML IJ SOLN
10.0000 mg | Freq: Once | INTRAMUSCULAR | Status: AC
Start: 2023-10-17 — End: 2023-10-17
  Administered 2023-10-17: 10 mg via INTRAVENOUS
  Filled 2023-10-17: qty 1

## 2023-10-17 MED ORDER — PALONOSETRON HCL INJECTION 0.25 MG/5ML
0.2500 mg | Freq: Once | INTRAVENOUS | Status: AC
  Administered 2023-10-17: 0.25 mg via INTRAVENOUS
  Filled 2023-10-17: qty 5

## 2023-10-17 MED ORDER — CETIRIZINE HCL 10 MG/ML IV SOLN
10.0000 mg | Freq: Once | INTRAVENOUS | Status: AC
  Administered 2023-10-17: 10 mg via INTRAVENOUS
  Filled 2023-10-17: qty 1

## 2023-10-17 MED ORDER — FAMOTIDINE IN NACL 20-0.9 MG/50ML-% IV SOLN
20.0000 mg | Freq: Once | INTRAVENOUS | Status: AC
  Administered 2023-10-17: 20 mg via INTRAVENOUS
  Filled 2023-10-17: qty 50

## 2023-10-17 MED ORDER — SODIUM CHLORIDE 0.9 % IV SOLN
45.0000 mg/m2 | Freq: Once | INTRAVENOUS | Status: AC
  Administered 2023-10-17: 84 mg via INTRAVENOUS
  Filled 2023-10-17: qty 14

## 2023-10-17 NOTE — Progress Notes (Signed)
Per Dr Arbutus Ped ok to treat with ANC of 1.3 today

## 2023-10-17 NOTE — Patient Instructions (Signed)
 [facility] - A DEPT OF [facility]  Discharge Instructions: Thank you for choosing [facility] to provide your oncology and hematology care.   If you have a lab appointment with the Cancer Center, please go directly to the Cancer Center and check in at the registration area.   Wear comfortable clothing and clothing appropriate for easy access to any Portacath or PICC line.   We strive to give you quality time with your provider. You may need to reschedule your appointment if you arrive late (15 or more minutes).  Arriving late affects you and other patients whose appointments are after yours.  Also, if you miss three or more appointments without notifying the office, you may be dismissed from the clinic at the provider's discretion.      For prescription refill requests, have your pharmacy contact our office and allow 72 hours for refills to be completed.    Today you received the following chemotherapy and/or immunotherapy agents: Taxol / Carbo      To help prevent nausea and vomiting after your treatment, we encourage you to take your nausea medication as directed.  BELOW ARE SYMPTOMS THAT SHOULD BE REPORTED IMMEDIATELY: *FEVER GREATER THAN 100.4 F (38 C) OR HIGHER *CHILLS OR SWEATING *NAUSEA AND VOMITING THAT IS NOT CONTROLLED WITH YOUR NAUSEA MEDICATION *UNUSUAL SHORTNESS OF BREATH *UNUSUAL BRUISING OR BLEEDING *URINARY PROBLEMS (pain or burning when urinating, or frequent urination) *BOWEL PROBLEMS (unusual diarrhea, constipation, pain near the anus) TENDERNESS IN MOUTH AND THROAT WITH OR WITHOUT PRESENCE OF ULCERS (sore throat, sores in mouth, or a toothache) UNUSUAL RASH, SWELLING OR PAIN  UNUSUAL VAGINAL DISCHARGE OR ITCHING   Items with * indicate a potential emergency and should be followed up as soon as possible or go to the Emergency Department if any problems should occur.  Please show the CHEMOTHERAPY ALERT CARD or  IMMUNOTHERAPY ALERT CARD at check-in to the Emergency Department and triage nurse.  Should you have questions after your visit or need to cancel or reschedule your appointment, please contact [facility] - A DEPT OF [facility]  Dept: [phone]  and follow the prompts.  Office hours are 8:00 a.m. to 4:30 p.m. Monday - Friday. Please note that voicemails left after 4:00 p.m. may not be returned until the following business day.  We are closed weekends and major holidays. You have access to a nurse at all times for urgent questions. Please call the main number to the clinic Dept: [phone] and follow the prompts.   For any non-urgent questions, you may also contact your provider using MyChart. We now offer e-Visits for anyone 61 and older to request care online for non-urgent symptoms. For details visit [email or web address].   Also download the MyChart app! Go to the app store, search "MyChart", open the app, select [facility], and log in with your MyChart username and password.

## 2023-10-18 ENCOUNTER — Ambulatory Visit
Admission: RE | Admit: 2023-10-18 | Discharge: 2023-10-18 | Disposition: A | Discharge status: Home / Self Care | Payer: Medicare Other | Source: Ambulatory Visit | Attending: Radiation Oncology

## 2023-10-18 ENCOUNTER — Other Ambulatory Visit: Payer: Self-pay

## 2023-10-18 DIAGNOSIS — Z510 Encounter for antineoplastic radiation therapy: Secondary | ICD-10-CM | POA: Diagnosis not present

## 2023-10-18 DIAGNOSIS — I252 Old myocardial infarction: Secondary | ICD-10-CM | POA: Diagnosis not present

## 2023-10-18 DIAGNOSIS — Z87891 Personal history of nicotine dependence: Secondary | ICD-10-CM | POA: Diagnosis not present

## 2023-10-18 DIAGNOSIS — Z5111 Encounter for antineoplastic chemotherapy: Secondary | ICD-10-CM | POA: Diagnosis not present

## 2023-10-18 DIAGNOSIS — M79602 Pain in left arm: Secondary | ICD-10-CM | POA: Diagnosis not present

## 2023-10-18 DIAGNOSIS — C3482 Malignant neoplasm of overlapping sites of left bronchus and lung: Secondary | ICD-10-CM | POA: Diagnosis not present

## 2023-10-18 DIAGNOSIS — R29818 Other symptoms and signs involving the nervous system: Secondary | ICD-10-CM | POA: Diagnosis not present

## 2023-10-18 LAB — RAD ONC ARIA SESSION SUMMARY
Course Elapsed Days: 28
Plan Fractions Treated to Date: 21
Plan Prescribed Dose Per Fraction: 2 Gy
Plan Total Fractions Prescribed: 30
Plan Total Prescribed Dose: 60 Gy
Reference Point Dosage Given to Date: 42 Gy
Reference Point Session Dosage Given: 2 Gy
Session Number: 21

## 2023-10-19 ENCOUNTER — Ambulatory Visit
Admission: RE | Admit: 2023-10-19 | Discharge: 2023-10-19 | Disposition: A | Discharge status: Home / Self Care | Payer: Medicare Other | Source: Ambulatory Visit | Attending: Radiation Oncology | Admitting: Radiation Oncology

## 2023-10-19 ENCOUNTER — Other Ambulatory Visit: Payer: Self-pay

## 2023-10-19 DIAGNOSIS — I252 Old myocardial infarction: Secondary | ICD-10-CM | POA: Diagnosis not present

## 2023-10-19 DIAGNOSIS — Z510 Encounter for antineoplastic radiation therapy: Secondary | ICD-10-CM | POA: Diagnosis not present

## 2023-10-19 DIAGNOSIS — Z87891 Personal history of nicotine dependence: Secondary | ICD-10-CM | POA: Diagnosis not present

## 2023-10-19 DIAGNOSIS — M79602 Pain in left arm: Secondary | ICD-10-CM | POA: Diagnosis not present

## 2023-10-19 DIAGNOSIS — R29818 Other symptoms and signs involving the nervous system: Secondary | ICD-10-CM | POA: Diagnosis not present

## 2023-10-19 DIAGNOSIS — Z5111 Encounter for antineoplastic chemotherapy: Secondary | ICD-10-CM | POA: Diagnosis not present

## 2023-10-19 DIAGNOSIS — C3482 Malignant neoplasm of overlapping sites of left bronchus and lung: Secondary | ICD-10-CM | POA: Diagnosis not present

## 2023-10-19 LAB — RAD ONC ARIA SESSION SUMMARY
Course Elapsed Days: 29
Plan Fractions Treated to Date: 22
Plan Prescribed Dose Per Fraction: 2 Gy
Plan Total Fractions Prescribed: 30
Plan Total Prescribed Dose: 60 Gy
Reference Point Dosage Given to Date: 44 Gy
Reference Point Session Dosage Given: 2 Gy
Session Number: 22

## 2023-10-20 ENCOUNTER — Ambulatory Visit
Admission: RE | Admit: 2023-10-20 | Discharge: 2023-10-20 | Disposition: A | Discharge status: Home / Self Care | Payer: Medicare Other | Source: Ambulatory Visit | Attending: Radiation Oncology | Admitting: Radiation Oncology

## 2023-10-20 ENCOUNTER — Other Ambulatory Visit: Payer: Self-pay

## 2023-10-20 DIAGNOSIS — I252 Old myocardial infarction: Secondary | ICD-10-CM | POA: Diagnosis not present

## 2023-10-20 DIAGNOSIS — M79602 Pain in left arm: Secondary | ICD-10-CM | POA: Diagnosis not present

## 2023-10-20 DIAGNOSIS — Z510 Encounter for antineoplastic radiation therapy: Secondary | ICD-10-CM | POA: Diagnosis not present

## 2023-10-20 DIAGNOSIS — C3482 Malignant neoplasm of overlapping sites of left bronchus and lung: Secondary | ICD-10-CM | POA: Diagnosis not present

## 2023-10-20 DIAGNOSIS — Z5111 Encounter for antineoplastic chemotherapy: Secondary | ICD-10-CM | POA: Diagnosis not present

## 2023-10-20 DIAGNOSIS — Z87891 Personal history of nicotine dependence: Secondary | ICD-10-CM | POA: Diagnosis not present

## 2023-10-20 DIAGNOSIS — R29818 Other symptoms and signs involving the nervous system: Secondary | ICD-10-CM | POA: Diagnosis not present

## 2023-10-20 LAB — RAD ONC ARIA SESSION SUMMARY
Course Elapsed Days: 30
Plan Fractions Treated to Date: 23
Plan Prescribed Dose Per Fraction: 2 Gy
Plan Total Fractions Prescribed: 30
Plan Total Prescribed Dose: 60 Gy
Reference Point Dosage Given to Date: 46 Gy
Reference Point Session Dosage Given: 2 Gy
Session Number: 23

## 2023-10-21 ENCOUNTER — Ambulatory Visit
Admission: RE | Admit: 2023-10-21 | Discharge: 2023-10-21 | Disposition: A | Discharge status: Home / Self Care | Payer: Medicare Other | Source: Ambulatory Visit | Attending: Radiation Oncology | Admitting: Radiation Oncology

## 2023-10-21 ENCOUNTER — Other Ambulatory Visit: Payer: Self-pay

## 2023-10-21 DIAGNOSIS — C3482 Malignant neoplasm of overlapping sites of left bronchus and lung: Secondary | ICD-10-CM | POA: Diagnosis not present

## 2023-10-21 DIAGNOSIS — Z5111 Encounter for antineoplastic chemotherapy: Secondary | ICD-10-CM | POA: Diagnosis not present

## 2023-10-21 DIAGNOSIS — I252 Old myocardial infarction: Secondary | ICD-10-CM | POA: Diagnosis not present

## 2023-10-21 DIAGNOSIS — R29818 Other symptoms and signs involving the nervous system: Secondary | ICD-10-CM | POA: Diagnosis not present

## 2023-10-21 DIAGNOSIS — M79602 Pain in left arm: Secondary | ICD-10-CM | POA: Diagnosis not present

## 2023-10-21 DIAGNOSIS — Z87891 Personal history of nicotine dependence: Secondary | ICD-10-CM | POA: Diagnosis not present

## 2023-10-21 DIAGNOSIS — Z510 Encounter for antineoplastic radiation therapy: Secondary | ICD-10-CM | POA: Diagnosis not present

## 2023-10-21 LAB — RAD ONC ARIA SESSION SUMMARY
Course Elapsed Days: 31
Plan Fractions Treated to Date: 24
Plan Prescribed Dose Per Fraction: 2 Gy
Plan Total Fractions Prescribed: 30
Plan Total Prescribed Dose: 60 Gy
Reference Point Dosage Given to Date: 48 Gy
Reference Point Session Dosage Given: 2 Gy
Session Number: 24

## 2023-10-21 NOTE — Progress Notes (Unsigned)
[facility] OFFICE PROGRESS NOTE  Emilio Aspen, MD [address] [place] [address]  DIAGNOSIS: Stage IIIC (T3, N3, M0) non-small cell lung cancer, adenocarcinoma presented with supraclavicular lymphadenopathy, 2 left lung nodules diagnosed in September 2024.  There is also a short segment of wall thickening involving the splenic flexure of the colon with associated mild hypermetabolism which is nonspecific and colonoscopy was recommended.    Biomarker Findings HRD signature - HRDsig Negative Microsatellite status - MS-Equivocal ? Tumor Mutational Burden - 2 Muts/Mb Genomic Findings For a complete list of the genes assayed, please refer to the Appendix. KRAS G12C TP53 R110L 7 Disease relevant genes with no reportable alterations: ALK, BRAF, EGFR, ERBB2, MET, RET, ROS1   PDL1 Expression 5%.  PRIOR THERAPY: None  CURRENT THERAPY: Concurrent chemoradiation with carboplatin for an AUC of 2 and taxol 45 mg/m2. First dose September 20, 2023. Status post 5 cycles.   INTERVAL HISTORY: Stephanie Newton 82 y.o. female returns to the clinic today for a follow-up visit.  The patient was last seen on 10/10/2023 by Dr. Arbutus Ped.  The patient is currently undergoing a course of concurrent chemoradiation.  Her last day radiation is scheduled for 11/02/2023 and she is followed by Dr. Mitzi Hansen.  Odynophagia and dysphagia?  She denies any fever, chills, night sweats, or unexplained weight loss. Her breathing is ***.  Chest pain?  Shortness of breath?  Cough?  Denies any hemoptysis.  Denies any nausea, vomiting, diarrhea, or constipation.  Denies any headache or visual changes.  Denies any rashes or skin changes.  Of note her initial PET scan showed some abnormalities in the splenic flexure and she was referred to GI.  She has an appointment on 12/19/22.  she is here today for evaluation repeat blood work before undergoing cycle #6.   MEDICAL HISTORY: Past Medical  History:  Diagnosis Date   Arthritis    Dysrhythmia    Hypercholesteremia    Hypertension    Myocardial infarction [facility])     ALLERGIES:  is allergic to shellfish allergy.  MEDICATIONS:  Current Outpatient Medications  Medication Sig Dispense Refill   amLODipine (NORVASC) 2.5 MG tablet Take 2.5 mg by mouth daily.     aspirin EC 81 MG tablet Take 1 tablet by mouth daily.     azithromycin (ZITHROMAX) 250 MG tablet Take 2 tablets today, then one tablet each day for the next 6 days. 6 tablet 0   estradiol (ESTRACE) 1 MG tablet Take 1 mg by mouth daily.     famotidine (PEPCID) 20 MG tablet Take 20 mg by mouth as needed.     hydrochlorothiazide (HYDRODIURIL) 12.5 MG tablet Take 12.5 mg by mouth daily.      methocarbamol (ROBAXIN) 500 MG tablet Take 1 tablet (500 mg total) by mouth every 6 (six) hours as needed for muscle spasms. 40 tablet 0   metoprolol succinate (TOPROL-XL) 100 MG 24 hr tablet Take 100 mg by mouth daily.     metoprolol succinate (TOPROL-XL) 50 MG 24 hr tablet Take 1 tablet (50 mg total) by mouth daily. 90 tablet 1   oxyCODONE-acetaminophen (PERCOCET/ROXICET) 5-325 MG tablet Take 1-2 tablets by mouth every 8 (eight) hours as needed for severe pain. 6 tablet 0   prochlorperazine (COMPAZINE) 10 MG tablet Take 1 tablet (10 mg total) by mouth every 6 (six) hours as needed. 30 tablet 2   simvastatin (ZOCOR) 20 MG tablet Take 20 mg by mouth at bedtime.  sucralfate (CARAFATE) 1 g tablet Take 1 tablet (1 g total) by mouth 4 (four) times daily -  with meals and at bedtime. 120 tablet 0   No current facility-administered medications for this visit.    SURGICAL HISTORY:  Past Surgical History:  Procedure Laterality Date   BREAST EXCISIONAL BIOPSY Right    x2   BRONCHIAL BIOPSY  08/30/2023   Procedure: BRONCHIAL BIOPSIES;  Surgeon: Josephine Igo, DO;  Location: [facility] ENDOSCOPY;  Service: Pulmonary;;   EYE SURGERY     cataract bilateral    HERNIA REPAIR  06/2000   INCISIONAL  VENTRAL ; DR Carman Ching    KNEE ARTHROSCOPY Left 08/23/2017   dr Thomasena Edis    LAPAROSCOPIC CHOLECYSTECTOMY  06/13/2000   DR DOUG [facility]    LEFT HEART CATH AND CORONARY ANGIOGRAPHY N/A 12/04/2018   Procedure: LEFT HEART CATH AND CORONARY ANGIOGRAPHY;  Surgeon: Lyn Records, MD;  Location: [facility] INVASIVE CV LAB;  Service: Cardiovascular;  Laterality: N/A;   TOTAL KNEE ARTHROPLASTY Left 03/31/2018   Procedure: LEFT TOTAL KNEE ARTHROPLASTY;  Surgeon: Eugenia Mcalpine, MD;  Location: [facility] ORS;  Service: Orthopedics;  Laterality: Left;  with block   TOTAL KNEE ARTHROPLASTY Right 04/30/2020   Procedure: RIGHT TOTAL KNEE ARTHROPLASTY;  Surgeon: Ollen Gross, MD;  Location: [facility] ORS;  Service: Orthopedics;  Laterality: Right;    VAGINAL HYSTERECTOMY  age 69s    REVIEW OF SYSTEMS:   Review of Systems  Constitutional: Negative for appetite change, chills, fatigue, fever and unexpected weight change.  HENT:   Negative for mouth sores, nosebleeds, sore throat and trouble swallowing.   Eyes: Negative for eye problems and icterus.  Respiratory: Negative for cough, hemoptysis, shortness of breath and wheezing.   Cardiovascular: Negative for chest pain and leg swelling.  Gastrointestinal: Negative for abdominal pain, constipation, diarrhea, nausea and vomiting.  Genitourinary: Negative for bladder incontinence, difficulty urinating, dysuria, frequency and hematuria.   Musculoskeletal: Negative for back pain, gait problem, neck pain and neck stiffness.  Skin: Negative for itching and rash.  Neurological: Negative for dizziness, extremity weakness, gait problem, headaches, light-headedness and seizures.  Hematological: Negative for adenopathy. Does not bruise/bleed easily.  Psychiatric/Behavioral: Negative for confusion, depression and sleep disturbance. The patient is not nervous/anxious.     PHYSICAL EXAMINATION:  There were no vitals taken for this visit.  ECOG PERFORMANCE STATUS: {CHL ONC ECOG  [id number]  Physical Exam  Constitutional: Oriented to person, place, and time and well-developed, well-nourished, and in no distress. No distress.  HENT:  Head: Normocephalic and atraumatic.  Mouth/Throat: Oropharynx is clear and moist. No oropharyngeal exudate.  Eyes: Conjunctivae are normal. Right eye exhibits no discharge. Left eye exhibits no discharge. No scleral icterus.  Neck: Normal range of motion. Neck supple.  Cardiovascular: Normal rate, regular rhythm, normal heart sounds and intact distal pulses.   Pulmonary/Chest: Effort normal and breath sounds normal. No respiratory distress. No wheezes. No rales.  Abdominal: Soft. Bowel sounds are normal. Exhibits no distension and no mass. There is no tenderness.  Musculoskeletal: Normal range of motion. Exhibits no edema.  Lymphadenopathy:    No cervical adenopathy.  Neurological: Alert and oriented to person, place, and time. Exhibits normal muscle tone. Gait normal. Coordination normal.  Skin: Skin is warm and dry. No rash noted. Not diaphoretic. No erythema. No pallor.  Psychiatric: Mood, memory and judgment normal.  Vitals reviewed.  LABORATORY DATA: Lab Results  Component Value Date   WBC 2.3 (L) 10/17/2023   HGB  13.0 10/17/2023   HCT 38.2 10/17/2023   MCV 88.2 10/17/2023   PLT 170 10/17/2023      Chemistry      Component Value Date/Time   NA 137 10/17/2023 1217   NA 139 10/19/2022 1030   K 3.3 (L) 10/17/2023 1217   CL 104 10/17/2023 1217   CO2 26 10/17/2023 1217   BUN 12 10/17/2023 1217   BUN 13 10/19/2022 1030   CREATININE 0.80 10/17/2023 1217      Component Value Date/Time   CALCIUM 9.8 10/17/2023 1217   ALKPHOS 70 10/17/2023 1217   AST 27 10/17/2023 1217   ALT 16 10/17/2023 1217   BILITOT 0.6 10/17/2023 1217       RADIOGRAPHIC STUDIES:  No results found.   ASSESSMENT/PLAN:  This is a very pleasant 82 year old African-American female diagnosed with Stage IIIC (T3, N3, M0) non-small cell lung  cancer, adenocarcinoma presented with supraclavicular lymphadenopathy, 2 left lung nodules diagnosed in September 2024.  There is also a short segment of wall thickening involving the splenic flexure of the colon with associated mild hypermetabolism which is nonspecific and colonoscopy was recommended.  Molecular studies showed positive KRAS G12C mutation and PD-L1 expression of 5%. The patient is currently undergoing a course of concurrent chemoradiation with weekly carboplatin for AUC of 2 and paclitaxel 45 Mg/M2 started on September 20, 2023.  She is status post 5 week of treatment.  Last day radiation is on 11/02/2023.  Labs were reviewed.  Recommend that she ***cycle #6 today as scheduled.  As long as her labs are within parameters next week, she will have her last chemotherapy next week with cycle #7.  I will order restaging CT scan to be performed approximately 3 weeks after her last day radiation.  We will then see her back the following week to review the results and discuss the next steps.  Swallowing?  Carafate?  Pepcid?  The patient was advised to call immediately if she has any concerning symptoms in the interval. The patient voices understanding of current disease status and treatment options and is in agreement with the current care plan. All questions were answered. The patient knows to call the clinic with any problems, questions or concerns. We can certainly see the patient much sooner if necessary          No orders of the defined types were placed in this encounter.    I spent {CHL ONC TIME VISIT - WUJWJ:[id number]} counseling the patient face to face. The total time spent in the appointment was {CHL ONC TIME VISIT - OZHYQ:[id number]}.  Mory Herrman L Azriel Jakob, PA-C 10/21/23

## 2023-10-24 ENCOUNTER — Inpatient Hospital Stay: Payer: Medicare Other

## 2023-10-24 ENCOUNTER — Other Ambulatory Visit: Payer: Self-pay

## 2023-10-24 ENCOUNTER — Other Ambulatory Visit: Payer: Self-pay | Admitting: Physician Assistant

## 2023-10-24 ENCOUNTER — Ambulatory Visit
Admission: RE | Admit: 2023-10-24 | Discharge: 2023-10-24 | Disposition: A | Discharge status: Home / Self Care | Payer: Medicare Other | Source: Ambulatory Visit | Attending: Radiation Oncology

## 2023-10-24 ENCOUNTER — Inpatient Hospital Stay (HOSPITAL_BASED_OUTPATIENT_CLINIC_OR_DEPARTMENT_OTHER): Payer: Medicare Other | Admitting: Physician Assistant

## 2023-10-24 VITALS — BP 117/71 | HR 81 | Temp 97.8°F | Resp 17 | Ht 65.0 in | Wt 171.7 lb

## 2023-10-24 DIAGNOSIS — I252 Old myocardial infarction: Secondary | ICD-10-CM | POA: Diagnosis not present

## 2023-10-24 DIAGNOSIS — Z87891 Personal history of nicotine dependence: Secondary | ICD-10-CM | POA: Diagnosis not present

## 2023-10-24 DIAGNOSIS — M79602 Pain in left arm: Secondary | ICD-10-CM | POA: Diagnosis not present

## 2023-10-24 DIAGNOSIS — C3412 Malignant neoplasm of upper lobe, left bronchus or lung: Secondary | ICD-10-CM

## 2023-10-24 DIAGNOSIS — Z510 Encounter for antineoplastic radiation therapy: Secondary | ICD-10-CM | POA: Diagnosis not present

## 2023-10-24 DIAGNOSIS — Z5111 Encounter for antineoplastic chemotherapy: Secondary | ICD-10-CM | POA: Diagnosis not present

## 2023-10-24 DIAGNOSIS — R29818 Other symptoms and signs involving the nervous system: Secondary | ICD-10-CM | POA: Diagnosis not present

## 2023-10-24 DIAGNOSIS — C3482 Malignant neoplasm of overlapping sites of left bronchus and lung: Secondary | ICD-10-CM | POA: Diagnosis not present

## 2023-10-24 DIAGNOSIS — E876 Hypokalemia: Secondary | ICD-10-CM

## 2023-10-24 LAB — CBC WITH DIFFERENTIAL (CANCER CENTER ONLY)
Abs Immature Granulocytes: 0.01 10*3/uL (ref 0.00–0.07)
Basophils Absolute: 0 10*3/uL (ref 0.0–0.1)
Basophils Relative: 1 %
Eosinophils Absolute: 0.1 10*3/uL (ref 0.0–0.5)
Eosinophils Relative: 4 %
HCT: 35.9 % — ABNORMAL LOW (ref 36.0–46.0)
Hemoglobin: 12.4 g/dL (ref 12.0–15.0)
Immature Granulocytes: 1 %
Lymphocytes Relative: 15 %
Lymphs Abs: 0.3 10*3/uL — ABNORMAL LOW (ref 0.7–4.0)
MCH: 30.5 pg (ref 26.0–34.0)
MCHC: 34.5 g/dL (ref 30.0–36.0)
MCV: 88.4 fL (ref 80.0–100.0)
Monocytes Absolute: 0.2 10*3/uL (ref 0.1–1.0)
Monocytes Relative: 12 %
Neutro Abs: 1.2 10*3/uL — ABNORMAL LOW (ref 1.7–7.7)
Neutrophils Relative %: 67 %
Platelet Count: 115 10*3/uL — ABNORMAL LOW (ref 150–400)
RBC: 4.06 MIL/uL (ref 3.87–5.11)
RDW: 13.6 % (ref 11.5–15.5)
WBC Count: 1.8 10*3/uL — ABNORMAL LOW (ref 4.0–10.5)
nRBC: 0 % (ref 0.0–0.2)

## 2023-10-24 LAB — CMP (CANCER CENTER ONLY)
ALT: 15 U/L (ref 0–44)
AST: 24 U/L (ref 15–41)
Albumin: 3.6 g/dL (ref 3.5–5.0)
Alkaline Phosphatase: 62 U/L (ref 38–126)
Anion gap: 7 (ref 5–15)
BUN: 12 mg/dL (ref 8–23)
CO2: 27 mmol/L (ref 22–32)
Calcium: 9.6 mg/dL (ref 8.9–10.3)
Chloride: 103 mmol/L (ref 98–111)
Creatinine: 0.83 mg/dL (ref 0.44–1.00)
GFR, Estimated: >60 mL/min (ref >60)
Glucose, Bld: 166 mg/dL — ABNORMAL HIGH (ref 70–99)
Potassium: 3.1 mmol/L — ABNORMAL LOW (ref 3.5–5.1)
Sodium: 137 mmol/L (ref 135–145)
Total Bilirubin: 0.4 mg/dL (ref <1.2)
Total Protein: 6.3 g/dL — ABNORMAL LOW (ref 6.5–8.1)

## 2023-10-24 LAB — RAD ONC ARIA SESSION SUMMARY
Course Elapsed Days: 34
Plan Fractions Treated to Date: 25
Plan Prescribed Dose Per Fraction: 2 Gy
Plan Total Fractions Prescribed: 30
Plan Total Prescribed Dose: 60 Gy
Reference Point Dosage Given to Date: 50 Gy
Reference Point Session Dosage Given: 2 Gy
Session Number: 25

## 2023-10-24 MED ORDER — SODIUM CHLORIDE 0.9 % IV SOLN
45.0000 mg/m2 | Freq: Once | INTRAVENOUS | Status: AC
  Administered 2023-10-24: 84 mg via INTRAVENOUS
  Filled 2023-10-24: qty 14

## 2023-10-24 MED ORDER — DEXAMETHASONE SODIUM PHOSPHATE 10 MG/ML IJ SOLN
10.0000 mg | Freq: Once | INTRAMUSCULAR | Status: AC
  Administered 2023-10-24: 10 mg via INTRAVENOUS
  Filled 2023-10-24: qty 1

## 2023-10-24 MED ORDER — FAMOTIDINE IN NACL 20-0.9 MG/50ML-% IV SOLN
20.0000 mg | Freq: Once | INTRAVENOUS | Status: AC
  Administered 2023-10-24: 20 mg via INTRAVENOUS
  Filled 2023-10-24: qty 50

## 2023-10-24 MED ORDER — POTASSIUM CHLORIDE CRYS ER 20 MEQ PO TBCR: 20.0000 meq | EXTENDED_RELEASE_TABLET | Freq: Every day | ORAL | 0 refills | Status: DC

## 2023-10-24 MED ORDER — PALONOSETRON HCL INJECTION 0.25 MG/5ML
0.2500 mg | Freq: Once | INTRAVENOUS | Status: AC
  Administered 2023-10-24: 0.25 mg via INTRAVENOUS
  Filled 2023-10-24: qty 5

## 2023-10-24 MED ORDER — SODIUM CHLORIDE 0.9 % IV SOLN: Freq: Once | INTRAVENOUS | Status: AC

## 2023-10-24 MED ORDER — SODIUM CHLORIDE 0.9 % IV SOLN
161.8000 mg | Freq: Once | INTRAVENOUS | Status: AC
  Administered 2023-10-24: 160 mg via INTRAVENOUS
  Filled 2023-10-24: qty 16

## 2023-10-24 MED ORDER — CETIRIZINE HCL 10 MG/ML IV SOLN
10.0000 mg | Freq: Once | INTRAVENOUS | Status: AC
  Administered 2023-10-24: 10 mg via INTRAVENOUS
  Filled 2023-10-24: qty 1

## 2023-10-24 NOTE — Patient Instructions (Addendum)
 [facility] - A DEPT OF [facility]  Discharge Instructions: Thank you for choosing [facility] to provide your oncology and hematology care.   If you have a lab appointment with the Cancer Center, please go directly to the Cancer Center and check in at the registration area.   Wear comfortable clothing and clothing appropriate for easy access to any Portacath or PICC line.   We strive to give you quality time with your provider. You may need to reschedule your appointment if you arrive late (15 or more minutes).  Arriving late affects you and other patients whose appointments are after yours.  Also, if you miss three or more appointments without notifying the office, you may be dismissed from the clinic at the provider's discretion.      For prescription refill requests, have your pharmacy contact our office and allow 72 hours for refills to be completed.    Today you received the following chemotherapy and/or immunotherapy agents Paclitaxel / Carboplatin      To help prevent nausea and vomiting after your treatment, we encourage you to take your nausea medication as directed.  BELOW ARE SYMPTOMS THAT SHOULD BE REPORTED IMMEDIATELY: *FEVER GREATER THAN 100.4 F (38 C) OR HIGHER *CHILLS OR SWEATING *NAUSEA AND VOMITING THAT IS NOT CONTROLLED WITH YOUR NAUSEA MEDICATION *UNUSUAL SHORTNESS OF BREATH *UNUSUAL BRUISING OR BLEEDING *URINARY PROBLEMS (pain or burning when urinating, or frequent urination) *BOWEL PROBLEMS (unusual diarrhea, constipation, pain near the anus) TENDERNESS IN MOUTH AND THROAT WITH OR WITHOUT PRESENCE OF ULCERS (sore throat, sores in mouth, or a toothache) UNUSUAL RASH, SWELLING OR PAIN  UNUSUAL VAGINAL DISCHARGE OR ITCHING   Items with * indicate a potential emergency and should be followed up as soon as possible or go to the Emergency Department if any problems should occur.  Please show the CHEMOTHERAPY ALERT CARD or  IMMUNOTHERAPY ALERT CARD at check-in to the Emergency Department and triage nurse.  Should you have questions after your visit or need to cancel or reschedule your appointment, please contact [facility] - A DEPT OF [facility]  Dept: [phone]  and follow the prompts.  Office hours are 8:00 a.m. to 4:30 p.m. Monday - Friday. Please note that voicemails left after 4:00 p.m. may not be returned until the following business day.  We are closed weekends and major holidays. You have access to a nurse at all times for urgent questions. Please call the main number to the clinic Dept: [phone] and follow the prompts.   For any non-urgent questions, you may also contact your provider using MyChart. We now offer e-Visits for anyone 28 and older to request care online for non-urgent symptoms. For details visit [email or web address].   Also download the MyChart app! Go to the app store, search "MyChart", open the app, select  [facility], and log in with your MyChart username and password.

## 2023-10-24 NOTE — Progress Notes (Signed)
Per Cassie, PA, okay to treat with ANC 1.2

## 2023-10-25 ENCOUNTER — Other Ambulatory Visit: Payer: Self-pay

## 2023-10-25 ENCOUNTER — Ambulatory Visit
Admission: RE | Admit: 2023-10-25 | Discharge: 2023-10-25 | Disposition: A | Discharge status: Home / Self Care | Payer: Medicare Other | Source: Ambulatory Visit | Attending: Radiation Oncology

## 2023-10-25 DIAGNOSIS — Z5111 Encounter for antineoplastic chemotherapy: Secondary | ICD-10-CM | POA: Diagnosis not present

## 2023-10-25 DIAGNOSIS — Z87891 Personal history of nicotine dependence: Secondary | ICD-10-CM | POA: Diagnosis not present

## 2023-10-25 DIAGNOSIS — M79602 Pain in left arm: Secondary | ICD-10-CM | POA: Diagnosis not present

## 2023-10-25 DIAGNOSIS — R29818 Other symptoms and signs involving the nervous system: Secondary | ICD-10-CM | POA: Diagnosis not present

## 2023-10-25 DIAGNOSIS — I252 Old myocardial infarction: Secondary | ICD-10-CM | POA: Diagnosis not present

## 2023-10-25 DIAGNOSIS — C3482 Malignant neoplasm of overlapping sites of left bronchus and lung: Secondary | ICD-10-CM | POA: Diagnosis not present

## 2023-10-25 DIAGNOSIS — Z510 Encounter for antineoplastic radiation therapy: Secondary | ICD-10-CM | POA: Diagnosis not present

## 2023-10-25 LAB — RAD ONC ARIA SESSION SUMMARY
Course Elapsed Days: 35
Plan Fractions Treated to Date: 26
Plan Prescribed Dose Per Fraction: 2 Gy
Plan Total Fractions Prescribed: 30
Plan Total Prescribed Dose: 60 Gy
Reference Point Dosage Given to Date: 52 Gy
Reference Point Session Dosage Given: 2 Gy
Session Number: 26

## 2023-10-26 ENCOUNTER — Ambulatory Visit
Admission: RE | Admit: 2023-10-26 | Discharge: 2023-10-26 | Disposition: A | Discharge status: Home / Self Care | Payer: Medicare Other | Source: Ambulatory Visit | Attending: Radiation Oncology | Admitting: Radiation Oncology

## 2023-10-26 ENCOUNTER — Other Ambulatory Visit: Payer: Self-pay

## 2023-10-26 DIAGNOSIS — Z5111 Encounter for antineoplastic chemotherapy: Secondary | ICD-10-CM | POA: Diagnosis not present

## 2023-10-26 DIAGNOSIS — R29818 Other symptoms and signs involving the nervous system: Secondary | ICD-10-CM | POA: Diagnosis not present

## 2023-10-26 DIAGNOSIS — C3482 Malignant neoplasm of overlapping sites of left bronchus and lung: Secondary | ICD-10-CM | POA: Diagnosis not present

## 2023-10-26 DIAGNOSIS — Z87891 Personal history of nicotine dependence: Secondary | ICD-10-CM | POA: Diagnosis not present

## 2023-10-26 DIAGNOSIS — I252 Old myocardial infarction: Secondary | ICD-10-CM | POA: Diagnosis not present

## 2023-10-26 DIAGNOSIS — M79602 Pain in left arm: Secondary | ICD-10-CM | POA: Diagnosis not present

## 2023-10-26 DIAGNOSIS — Z510 Encounter for antineoplastic radiation therapy: Secondary | ICD-10-CM | POA: Diagnosis not present

## 2023-10-26 LAB — RAD ONC ARIA SESSION SUMMARY
Course Elapsed Days: 36
Plan Fractions Treated to Date: 27
Plan Prescribed Dose Per Fraction: 2 Gy
Plan Total Fractions Prescribed: 30
Plan Total Prescribed Dose: 60 Gy
Reference Point Dosage Given to Date: 54 Gy
Reference Point Session Dosage Given: 2 Gy
Session Number: 27

## 2023-10-27 ENCOUNTER — Ambulatory Visit
Admission: RE | Admit: 2023-10-27 | Discharge: 2023-10-27 | Disposition: A | Discharge status: Home / Self Care | Payer: Medicare Other | Source: Ambulatory Visit | Attending: Internal Medicine | Admitting: Internal Medicine

## 2023-10-27 ENCOUNTER — Other Ambulatory Visit: Payer: Self-pay

## 2023-10-27 ENCOUNTER — Ambulatory Visit
Admission: RE | Admit: 2023-10-27 | Discharge: 2023-10-27 | Disposition: A | Discharge status: Home / Self Care | Payer: Medicare Other | Source: Ambulatory Visit | Attending: Radiation Oncology | Admitting: Radiation Oncology

## 2023-10-27 DIAGNOSIS — C3482 Malignant neoplasm of overlapping sites of left bronchus and lung: Secondary | ICD-10-CM | POA: Diagnosis not present

## 2023-10-27 DIAGNOSIS — Z87891 Personal history of nicotine dependence: Secondary | ICD-10-CM | POA: Diagnosis not present

## 2023-10-27 DIAGNOSIS — I252 Old myocardial infarction: Secondary | ICD-10-CM | POA: Diagnosis not present

## 2023-10-27 DIAGNOSIS — Z510 Encounter for antineoplastic radiation therapy: Secondary | ICD-10-CM | POA: Diagnosis not present

## 2023-10-27 DIAGNOSIS — D320 Benign neoplasm of cerebral meninges: Secondary | ICD-10-CM | POA: Diagnosis not present

## 2023-10-27 DIAGNOSIS — Z5111 Encounter for antineoplastic chemotherapy: Secondary | ICD-10-CM | POA: Diagnosis not present

## 2023-10-27 DIAGNOSIS — R29818 Other symptoms and signs involving the nervous system: Secondary | ICD-10-CM | POA: Diagnosis not present

## 2023-10-27 DIAGNOSIS — M79602 Pain in left arm: Secondary | ICD-10-CM | POA: Diagnosis not present

## 2023-10-27 DIAGNOSIS — G9389 Other specified disorders of brain: Secondary | ICD-10-CM

## 2023-10-27 LAB — RAD ONC ARIA SESSION SUMMARY
Course Elapsed Days: 37
Plan Fractions Treated to Date: 28
Plan Prescribed Dose Per Fraction: 2 Gy
Plan Total Fractions Prescribed: 30
Plan Total Prescribed Dose: 60 Gy
Reference Point Dosage Given to Date: 56 Gy
Reference Point Session Dosage Given: 2 Gy
Session Number: 28

## 2023-10-27 MED ORDER — GADOPICLENOL 0.5 MMOL/ML IV SOLN
7.5000 mL | Freq: Once | INTRAVENOUS | Status: AC | PRN
  Administered 2023-10-27: 7.5 mL via INTRAVENOUS

## 2023-10-28 ENCOUNTER — Ambulatory Visit
Admission: RE | Admit: 2023-10-28 | Discharge: 2023-10-28 | Disposition: A | Discharge status: Home / Self Care | Payer: Medicare Other | Source: Ambulatory Visit | Attending: Radiation Oncology | Admitting: Radiation Oncology

## 2023-10-28 ENCOUNTER — Ambulatory Visit
Admission: RE | Admit: 2023-10-28 | Discharge: 2023-10-28 | Disposition: A | Discharge status: Home / Self Care | Payer: Medicare Other | Source: Ambulatory Visit | Attending: Radiation Oncology

## 2023-10-28 ENCOUNTER — Other Ambulatory Visit: Payer: Self-pay

## 2023-10-28 DIAGNOSIS — Z87891 Personal history of nicotine dependence: Secondary | ICD-10-CM | POA: Diagnosis not present

## 2023-10-28 DIAGNOSIS — C3482 Malignant neoplasm of overlapping sites of left bronchus and lung: Secondary | ICD-10-CM | POA: Diagnosis not present

## 2023-10-28 DIAGNOSIS — I252 Old myocardial infarction: Secondary | ICD-10-CM | POA: Diagnosis not present

## 2023-10-28 DIAGNOSIS — R29818 Other symptoms and signs involving the nervous system: Secondary | ICD-10-CM | POA: Diagnosis not present

## 2023-10-28 DIAGNOSIS — Z510 Encounter for antineoplastic radiation therapy: Secondary | ICD-10-CM | POA: Diagnosis not present

## 2023-10-28 DIAGNOSIS — M79602 Pain in left arm: Secondary | ICD-10-CM | POA: Diagnosis not present

## 2023-10-28 DIAGNOSIS — Z5111 Encounter for antineoplastic chemotherapy: Secondary | ICD-10-CM | POA: Diagnosis not present

## 2023-10-28 LAB — RAD ONC ARIA SESSION SUMMARY
Course Elapsed Days: 38
Plan Fractions Treated to Date: 29
Plan Prescribed Dose Per Fraction: 2 Gy
Plan Total Fractions Prescribed: 30
Plan Total Prescribed Dose: 60 Gy
Reference Point Dosage Given to Date: 58 Gy
Reference Point Session Dosage Given: 2 Gy
Session Number: 29

## 2023-10-30 ENCOUNTER — Other Ambulatory Visit: Payer: Self-pay

## 2023-10-30 ENCOUNTER — Ambulatory Visit
Admission: RE | Admit: 2023-10-30 | Discharge: 2023-10-30 | Disposition: A | Discharge status: Home / Self Care | Payer: Medicare Other | Source: Ambulatory Visit | Attending: Radiation Oncology

## 2023-10-30 DIAGNOSIS — Z510 Encounter for antineoplastic radiation therapy: Secondary | ICD-10-CM | POA: Diagnosis not present

## 2023-10-30 DIAGNOSIS — R29818 Other symptoms and signs involving the nervous system: Secondary | ICD-10-CM | POA: Diagnosis not present

## 2023-10-30 DIAGNOSIS — C3482 Malignant neoplasm of overlapping sites of left bronchus and lung: Secondary | ICD-10-CM | POA: Diagnosis not present

## 2023-10-30 DIAGNOSIS — M79602 Pain in left arm: Secondary | ICD-10-CM | POA: Diagnosis not present

## 2023-10-30 DIAGNOSIS — Z5111 Encounter for antineoplastic chemotherapy: Secondary | ICD-10-CM | POA: Diagnosis not present

## 2023-10-30 DIAGNOSIS — Z87891 Personal history of nicotine dependence: Secondary | ICD-10-CM | POA: Diagnosis not present

## 2023-10-30 DIAGNOSIS — I252 Old myocardial infarction: Secondary | ICD-10-CM | POA: Diagnosis not present

## 2023-10-30 LAB — RAD ONC ARIA SESSION SUMMARY
Course Elapsed Days: 40
Plan Fractions Treated to Date: 30
Plan Prescribed Dose Per Fraction: 2 Gy
Plan Total Fractions Prescribed: 30
Plan Total Prescribed Dose: 60 Gy
Reference Point Dosage Given to Date: 60 Gy
Reference Point Session Dosage Given: 2 Gy
Session Number: 30

## 2023-10-31 ENCOUNTER — Inpatient Hospital Stay: Payer: Medicare Other

## 2023-10-31 ENCOUNTER — Encounter: Payer: Self-pay | Admitting: Internal Medicine

## 2023-10-31 ENCOUNTER — Ambulatory Visit: Payer: Medicare Other

## 2023-10-31 ENCOUNTER — Other Ambulatory Visit: Payer: Self-pay

## 2023-10-31 ENCOUNTER — Ambulatory Visit
Admission: RE | Admit: 2023-10-31 | Discharge: 2023-10-31 | Disposition: A | Discharge status: Home / Self Care | Payer: Medicare Other | Source: Ambulatory Visit | Attending: Radiation Oncology

## 2023-10-31 DIAGNOSIS — C3412 Malignant neoplasm of upper lobe, left bronchus or lung: Secondary | ICD-10-CM

## 2023-10-31 DIAGNOSIS — Z510 Encounter for antineoplastic radiation therapy: Secondary | ICD-10-CM | POA: Diagnosis not present

## 2023-10-31 DIAGNOSIS — I252 Old myocardial infarction: Secondary | ICD-10-CM | POA: Diagnosis not present

## 2023-10-31 DIAGNOSIS — Z5111 Encounter for antineoplastic chemotherapy: Secondary | ICD-10-CM | POA: Diagnosis not present

## 2023-10-31 DIAGNOSIS — R29818 Other symptoms and signs involving the nervous system: Secondary | ICD-10-CM | POA: Diagnosis not present

## 2023-10-31 DIAGNOSIS — M79602 Pain in left arm: Secondary | ICD-10-CM | POA: Diagnosis not present

## 2023-10-31 DIAGNOSIS — C3482 Malignant neoplasm of overlapping sites of left bronchus and lung: Secondary | ICD-10-CM | POA: Diagnosis not present

## 2023-10-31 DIAGNOSIS — Z87891 Personal history of nicotine dependence: Secondary | ICD-10-CM | POA: Diagnosis not present

## 2023-10-31 LAB — CBC WITH DIFFERENTIAL (CANCER CENTER ONLY)
Abs Immature Granulocytes: 0.01 10*3/uL (ref 0.00–0.07)
Basophils Absolute: 0 10*3/uL (ref 0.0–0.1)
Basophils Relative: 2 %
Eosinophils Absolute: 0 10*3/uL (ref 0.0–0.5)
Eosinophils Relative: 3 %
HCT: 34.4 % — ABNORMAL LOW (ref 36.0–46.0)
Hemoglobin: 11.5 g/dL — ABNORMAL LOW (ref 12.0–15.0)
Immature Granulocytes: 1 %
Lymphocytes Relative: 20 %
Lymphs Abs: 0.3 10*3/uL — ABNORMAL LOW (ref 0.7–4.0)
MCH: 29.9 pg (ref 26.0–34.0)
MCHC: 33.4 g/dL (ref 30.0–36.0)
MCV: 89.6 fL (ref 80.0–100.0)
Monocytes Absolute: 0.2 10*3/uL (ref 0.1–1.0)
Monocytes Relative: 11 %
Neutro Abs: 0.9 10*3/uL — ABNORMAL LOW (ref 1.7–7.7)
Neutrophils Relative %: 63 %
Platelet Count: 152 10*3/uL (ref 150–400)
RBC: 3.84 MIL/uL — ABNORMAL LOW (ref 3.87–5.11)
RDW: 13.9 % (ref 11.5–15.5)
WBC Count: 1.3 10*3/uL — ABNORMAL LOW (ref 4.0–10.5)
nRBC: 0 % (ref 0.0–0.2)

## 2023-10-31 LAB — CMP (CANCER CENTER ONLY)
ALT: 14 U/L (ref 0–44)
AST: 23 U/L (ref 15–41)
Albumin: 3.7 g/dL (ref 3.5–5.0)
Alkaline Phosphatase: 67 U/L (ref 38–126)
Anion gap: 5 (ref 5–15)
BUN: 10 mg/dL (ref 8–23)
CO2: 28 mmol/L (ref 22–32)
Calcium: 9.4 mg/dL (ref 8.9–10.3)
Chloride: 107 mmol/L (ref 98–111)
Creatinine: 0.76 mg/dL (ref 0.44–1.00)
GFR, Estimated: >60 mL/min (ref >60)
Glucose, Bld: 96 mg/dL (ref 70–99)
Potassium: 4 mmol/L (ref 3.5–5.1)
Sodium: 140 mmol/L (ref 135–145)
Total Bilirubin: 0.3 mg/dL (ref <1.2)
Total Protein: 6.5 g/dL (ref 6.5–8.1)

## 2023-10-31 LAB — RAD ONC ARIA SESSION SUMMARY
Course Elapsed Days: 41
Plan Fractions Treated to Date: 1
Plan Prescribed Dose Per Fraction: 2 Gy
Plan Total Fractions Prescribed: 3
Plan Total Prescribed Dose: 6 Gy
Reference Point Dosage Given to Date: 2 Gy
Reference Point Session Dosage Given: 2 Gy
Session Number: 31

## 2023-10-31 NOTE — Progress Notes (Signed)
ANC today is 0.9, per Cassie Heilingoetter PA no treatment today.  Patient is to keep her next appointment on 11/28/23.  Patient verbalizes understanding.

## 2023-11-01 ENCOUNTER — Ambulatory Visit
Admission: RE | Admit: 2023-11-01 | Discharge: 2023-11-01 | Disposition: A | Discharge status: Home / Self Care | Payer: Medicare Other | Source: Ambulatory Visit | Attending: Radiation Oncology

## 2023-11-01 ENCOUNTER — Other Ambulatory Visit: Payer: Self-pay

## 2023-11-01 ENCOUNTER — Inpatient Hospital Stay (HOSPITAL_BASED_OUTPATIENT_CLINIC_OR_DEPARTMENT_OTHER): Payer: Medicare Other | Admitting: Internal Medicine

## 2023-11-01 ENCOUNTER — Telehealth: Payer: Self-pay | Admitting: Medical Oncology

## 2023-11-01 ENCOUNTER — Ambulatory Visit
Admission: RE | Admit: 2023-11-01 | Discharge: 2023-11-01 | Disposition: A | Discharge status: Home / Self Care | Payer: Medicare Other | Source: Ambulatory Visit | Attending: Radiation Oncology | Admitting: Radiation Oncology

## 2023-11-01 ENCOUNTER — Ambulatory Visit: Payer: Medicare Other

## 2023-11-01 DIAGNOSIS — M79602 Pain in left arm: Secondary | ICD-10-CM | POA: Diagnosis not present

## 2023-11-01 DIAGNOSIS — D329 Benign neoplasm of meninges, unspecified: Secondary | ICD-10-CM | POA: Diagnosis not present

## 2023-11-01 DIAGNOSIS — Z510 Encounter for antineoplastic radiation therapy: Secondary | ICD-10-CM | POA: Diagnosis not present

## 2023-11-01 DIAGNOSIS — I252 Old myocardial infarction: Secondary | ICD-10-CM | POA: Diagnosis not present

## 2023-11-01 DIAGNOSIS — R29818 Other symptoms and signs involving the nervous system: Secondary | ICD-10-CM | POA: Diagnosis not present

## 2023-11-01 DIAGNOSIS — C3482 Malignant neoplasm of overlapping sites of left bronchus and lung: Secondary | ICD-10-CM | POA: Diagnosis not present

## 2023-11-01 DIAGNOSIS — Z5111 Encounter for antineoplastic chemotherapy: Secondary | ICD-10-CM | POA: Diagnosis not present

## 2023-11-01 LAB — RAD ONC ARIA SESSION SUMMARY
Course Elapsed Days: 42
Plan Fractions Treated to Date: 2
Plan Prescribed Dose Per Fraction: 2 Gy
Plan Total Fractions Prescribed: 3
Plan Total Prescribed Dose: 6 Gy
Reference Point Dosage Given to Date: 4 Gy
Reference Point Session Dosage Given: 2 Gy
Session Number: 32

## 2023-11-01 NOTE — Progress Notes (Signed)
I connected with Stephanie Newton on 11/01/23 at  9:30 AM EST by telephone visit and verified that I am speaking with the correct person using two identifiers.  I discussed the limitations, risks, security and privacy concerns of performing an evaluation and management service by telemedicine and the availability of in-person appointments. I also discussed with the patient that there may be a patient responsible charge related to this service. The patient expressed understanding and agreed to proceed.  Other persons participating in the visit and their role in the encounter:  n/a  Patient's location:  Home Provider's location:  Office Chief Complaint:  No diagnosis found.  History of Present Ilness: Stephanie Newton reports no clinical changes today. Observations: Language and cognition at baseline  Imaging:  [facility] Clinician Interpretation: I have personally reviewed the CNS images as listed.  My interpretation, in the context of the patient's clinical presentation, is stable disease  MR BRAIN W WO CONTRAST  Result Date: 10/27/2023 CLINICAL DATA:  Brain/CNS neoplasm. Assess treatment response. Metastatic lung cancer. EXAM: MRI HEAD WITHOUT AND WITH CONTRAST TECHNIQUE: Multiplanar, multiecho pulse sequences of the brain and surrounding structures were obtained without and with intravenous contrast. CONTRAST:  7.5 cc Vueway COMPARISON:  07/08/2023 FINDINGS: Brain: Diffusion imaging does not show any acute or subacute infarction or other cause of restricted diffusion. No focal abnormality affects the brainstem or cerebellum. As seen previously, there is a meningioma projecting inferiorly from the anterior superior tentorial leaflet on the left, projecting into the perimesencephalic cistern. Slight indentation of the lateral aspect of the pons on the left but without any significant mass effect or edema. No change in size of the lesion, maximal dimension in the coronal plane unchanged at 11 x 8 mm.  Maximal AP dimension also stable at 15 mm. Cerebral hemispheres show only a few punctate foci of T2 and FLAIR signal within the white matter which are unchanged. No evidence of large vessel stroke. No hemispheric mass lesion. No abnormal leptomeningeal enhancement in the region. No hydrocephalus, hemorrhage or extra-axial fluid collection. Vascular: Major vessels at the base of the brain show flow. Skull and upper cervical spine: Negative Sinuses/Orbits: Clear/normal Other: None IMPRESSION: 1. No change since 07/08/2023. No evidence of metastatic disease. 2. Stable meningioma projecting inferiorly from the anterior superior tentorial leaflet on the left, projecting into the perimesencephalic cistern. Slight indentation of the lateral aspect of the pons on the left but without any significant mass effect or edema. No enlargement over the last 4 months. Electronically Signed   By: Paulina Fusi M.D.   On: 10/27/2023 13:43    Assessment and Plan: Meningioma [facility])  Stephanie Newton is clinically stable today.  MRI brain demonstrates stable clival mass, c/w meningioma.  Follow Up Instructions:  We ask that Stephanie Newton return to clinic in 12 months following next brain MRI, or sooner as needed.  I discussed the assessment and treatment plan with the patient.  The patient was provided an opportunity to ask questions and all were answered.  The patient agreed with the plan and demonstrated understanding of the instructions.    The patient was advised to call back or seek an in-person evaluation if the symptoms worsen or if the condition fails to improve as anticipated.    Henreitta Leber, MD   I provided 23 minutes of non face-to-face telephone visit time during this encounter, and > 50% was spent counseling as documented under my assessment & plan.

## 2023-11-01 NOTE — Telephone Encounter (Signed)
Cannot swallow carafate -"too large for my throat and  scratches my throat". LVM to dissolve it in water and try to take it. I asked her to call me back .

## 2023-11-02 ENCOUNTER — Ambulatory Visit: Payer: Medicare Other

## 2023-11-02 ENCOUNTER — Other Ambulatory Visit: Payer: Self-pay

## 2023-11-02 ENCOUNTER — Ambulatory Visit
Admission: RE | Admit: 2023-11-02 | Discharge: 2023-11-02 | Disposition: A | Discharge status: Home / Self Care | Payer: Medicare Other | Source: Ambulatory Visit | Attending: Radiation Oncology | Admitting: Radiation Oncology

## 2023-11-02 DIAGNOSIS — Z87891 Personal history of nicotine dependence: Secondary | ICD-10-CM | POA: Diagnosis not present

## 2023-11-02 DIAGNOSIS — C3482 Malignant neoplasm of overlapping sites of left bronchus and lung: Secondary | ICD-10-CM | POA: Diagnosis not present

## 2023-11-02 DIAGNOSIS — R29818 Other symptoms and signs involving the nervous system: Secondary | ICD-10-CM | POA: Diagnosis not present

## 2023-11-02 DIAGNOSIS — Z510 Encounter for antineoplastic radiation therapy: Secondary | ICD-10-CM | POA: Diagnosis not present

## 2023-11-02 DIAGNOSIS — Z5111 Encounter for antineoplastic chemotherapy: Secondary | ICD-10-CM | POA: Diagnosis not present

## 2023-11-02 DIAGNOSIS — I252 Old myocardial infarction: Secondary | ICD-10-CM | POA: Diagnosis not present

## 2023-11-02 DIAGNOSIS — M79602 Pain in left arm: Secondary | ICD-10-CM | POA: Diagnosis not present

## 2023-11-02 LAB — RAD ONC ARIA SESSION SUMMARY
Course Elapsed Days: 43
Plan Fractions Treated to Date: 3
Plan Prescribed Dose Per Fraction: 2 Gy
Plan Total Fractions Prescribed: 3
Plan Total Prescribed Dose: 6 Gy
Reference Point Dosage Given to Date: 6 Gy
Reference Point Session Dosage Given: 2 Gy
Session Number: 33

## 2023-11-02 NOTE — Telephone Encounter (Signed)
Pt stated she dissolved the carafate and she is able to swallow it.

## 2023-11-03 ENCOUNTER — Ambulatory Visit: Payer: Medicare Other

## 2023-11-03 ENCOUNTER — Other Ambulatory Visit: Payer: Self-pay

## 2023-11-04 ENCOUNTER — Ambulatory Visit (HOSPITAL_COMMUNITY): Payer: Medicare Other

## 2023-11-07 ENCOUNTER — Ambulatory Visit: Payer: Medicare Other

## 2023-11-07 NOTE — Radiation Completion Notes (Addendum)
  Radiation Oncology         [phone] ________________________________  Name: Stephanie Newton MRN: [id number]  Date of Service: 11/02/2023  DOB: Mar 22, 1941  End of Treatment Note    Diagnosis:  Stage IIIC (T3, N3, M0) non-small cell lung cancer, adenocarcinoma. he had left supraclavicular nodal metastasis and 2 left lung nodules suspicious for bronchogenic carcinoma and/or metastasis.   Intent: Curative     ==========DELIVERED PLANS==========  First Treatment Date: 2023-09-20 - Last Treatment Date: 2023-11-02   Plan Name: Lung_L Site: Lung, Left Technique: 3D Mode: Photon Dose Per Fraction: 2 Gy Prescribed Dose (Delivered / Prescribed): 60 Gy / 60 Gy Prescribed Fxs (Delivered / Prescribed): 30 / 30   Plan Name: Lung_L_Bst Site: Lung, Left Technique: 3D Mode: Photon Dose Per Fraction: 2 Gy Prescribed Dose (Delivered / Prescribed): 6 Gy / 6 Gy Prescribed Fxs (Delivered / Prescribed): 3 / 3     ==========ON TREATMENT VISIT DATES========== 2023-09-23, 2023-09-30, 2023-10-07, 2023-10-14, 2023-10-21, 2023-10-28, 2023-11-01   See weekly On Treatment Notes in Epic for details. The patient tolerated radiation. She developed fatigue and anticipated skin changes in the treatment field as well as esophagitis from radiation.   The patient will receive a call in about one month from the radiation oncology department. She will continue follow up with Dr. Arbutus Ped as well.      Osker Mason, PAC

## 2023-11-08 ENCOUNTER — Ambulatory Visit: Payer: Medicare Other

## 2023-11-17 ENCOUNTER — Ambulatory Visit (HOSPITAL_COMMUNITY)
Admission: RE | Admit: 2023-11-17 | Discharge: 2023-11-17 | Disposition: A | Discharge status: Home / Self Care | Payer: Medicare Other | Source: Ambulatory Visit | Attending: Physician Assistant | Admitting: Physician Assistant

## 2023-11-17 DIAGNOSIS — C3412 Malignant neoplasm of upper lobe, left bronchus or lung: Secondary | ICD-10-CM | POA: Insufficient documentation

## 2023-11-17 DIAGNOSIS — R918 Other nonspecific abnormal finding of lung field: Secondary | ICD-10-CM | POA: Diagnosis not present

## 2023-11-17 DIAGNOSIS — Z9221 Personal history of antineoplastic chemotherapy: Secondary | ICD-10-CM | POA: Diagnosis not present

## 2023-11-17 DIAGNOSIS — C3490 Malignant neoplasm of unspecified part of unspecified bronchus or lung: Secondary | ICD-10-CM | POA: Diagnosis not present

## 2023-11-17 MED ORDER — IOHEXOL 300 MG/ML  SOLN
75.0000 mL | Freq: Once | INTRAMUSCULAR | Status: AC | PRN
  Administered 2023-11-17: 75 mL via INTRAVENOUS

## 2023-11-22 ENCOUNTER — Telehealth: Payer: Self-pay | Admitting: Internal Medicine

## 2023-11-22 NOTE — Telephone Encounter (Signed)
Confirmed patient scheduled for 52 week f/u telephone visit.

## 2023-11-25 NOTE — Progress Notes (Unsigned)
[facility] OFFICE PROGRESS NOTE  Stephanie Aspen, MD [address] [place] [address]  DIAGNOSIS: Stage IIIC (T3, N3, M0) non-small cell lung cancer, adenocarcinoma presented with supraclavicular lymphadenopathy, 2 left lung nodules diagnosed in September 2024.  There is also a short segment of wall thickening involving the splenic flexure of the colon with associated mild hypermetabolism which is nonspecific and colonoscopy was recommended.    Biomarker Findings HRD signature - HRDsig Negative Microsatellite status - MS-Equivocal ? Tumor Mutational Burden - 2 Muts/Mb Genomic Findings For a complete list of the genes assayed, please refer to the Appendix. KRAS G12C TP53 R110L 7 Disease relevant genes with no reportable alterations: ALK, BRAF, EGFR, ERBB2, MET, RET, ROS1   PDL1 Expression 5%.  PRIOR THERAPY: Concurrent chemoradiation with carboplatin for an AUC of 2 and taxol 45 mg/m2. Last dose on 10/24/23. Status post 6 cycles.   CURRENT THERAPY: Consolidation immunotherapy with Imfinzi 1500 mg IV every 4 weeks.  First dose expected on 12/05/2023  INTERVAL HISTORY: Stephanie Newton 82 y.o. female returns to the clinic today for a follow-up visit accompanied by her husband.  The patient was last seen in the clinic by myself on 10/24/2023.  The patient completed a course concurrent chemoradiation.  She tolerated this well overall except for mild odynophagia and dysphagia.  This has resolved at this time. She had some radiation burnings on her left supraclavicular lymph node which is improving/healing.    She denies any fever, chills, night sweats, unexplained weight loss. She reports that she is not as hungry. She gained weight but she attributes this to having her purse on her when she was on the scale today. She states she has no problem with her breathing. Denies any chest pain, shortness of breath, cough, or hemoptysis.  Denies any nausea,  vomiting, diarrhea, or constipation.  Denies any headache or visual changes.  Of note her initial PET scan showed some abnormalities in the splenic flexure and she was referred to GI.  She has an appointment on 12/19/22.  She recently had a restaging CT scan performed.  She is here today for evaluation to review her scan results and discuss the next steps in her care.    MEDICAL HISTORY: Past Medical History:  Diagnosis Date   Arthritis    Dysrhythmia    Hypercholesteremia    Hypertension    Myocardial infarction [facility])     ALLERGIES:  is allergic to shellfish allergy.  MEDICATIONS:  Current Outpatient Medications  Medication Sig Dispense Refill   amLODipine (NORVASC) 2.5 MG tablet Take 2.5 mg by mouth daily.     aspirin EC 81 MG tablet Take 1 tablet by mouth daily.     azithromycin (ZITHROMAX) 250 MG tablet Take 2 tablets today, then one tablet each day for the next 6 days. 6 tablet 0   estradiol (ESTRACE) 1 MG tablet Take 1 mg by mouth daily.     famotidine (PEPCID) 20 MG tablet Take 20 mg by mouth as needed.     hydrochlorothiazide (HYDRODIURIL) 12.5 MG tablet Take 12.5 mg by mouth daily.      methocarbamol (ROBAXIN) 500 MG tablet Take 1 tablet (500 mg total) by mouth every 6 (six) hours as needed for muscle spasms. 40 tablet 0   metoprolol succinate (TOPROL-XL) 100 MG 24 hr tablet Take 100 mg by mouth daily.     metoprolol succinate (TOPROL-XL) 50 MG 24 hr tablet Take 1 tablet (50 mg  total) by mouth daily. 90 tablet 1   oxyCODONE-acetaminophen (PERCOCET/ROXICET) 5-325 MG tablet Take 1-2 tablets by mouth every 8 (eight) hours as needed for severe pain. 6 tablet 0   potassium chloride SA (KLOR-CON M) 20 MEQ tablet Take 1 tablet (20 mEq total) by mouth daily. 7 tablet 0   prochlorperazine (COMPAZINE) 10 MG tablet Take 1 tablet (10 mg total) by mouth every 6 (six) hours as needed. 30 tablet 2   simvastatin (ZOCOR) 20 MG tablet Take 20 mg by mouth at bedtime.     sucralfate (CARAFATE) 1  g tablet Take 1 tablet (1 g total) by mouth 4 (four) times daily -  with meals and at bedtime. 120 tablet 0   No current facility-administered medications for this visit.    SURGICAL HISTORY:  Past Surgical History:  Procedure Laterality Date   BREAST EXCISIONAL BIOPSY Right    x2   BRONCHIAL BIOPSY  08/30/2023   Procedure: BRONCHIAL BIOPSIES;  Surgeon: Josephine Igo, DO;  Location: [facility] ENDOSCOPY;  Service: Pulmonary;;   EYE SURGERY     cataract bilateral    HERNIA REPAIR  06/2000   INCISIONAL VENTRAL ; DR Carman Ching    KNEE ARTHROSCOPY Left 08/23/2017   dr Thomasena Edis    LAPAROSCOPIC CHOLECYSTECTOMY  06/13/2000   DR DOUG [facility]    LEFT HEART CATH AND CORONARY ANGIOGRAPHY N/A 12/04/2018   Procedure: LEFT HEART CATH AND CORONARY ANGIOGRAPHY;  Surgeon: Lyn Records, MD;  Location: [facility] INVASIVE CV LAB;  Service: Cardiovascular;  Laterality: N/A;   TOTAL KNEE ARTHROPLASTY Left 03/31/2018   Procedure: LEFT TOTAL KNEE ARTHROPLASTY;  Surgeon: Eugenia Mcalpine, MD;  Location: [facility] ORS;  Service: Orthopedics;  Laterality: Left;  with block   TOTAL KNEE ARTHROPLASTY Right 04/30/2020   Procedure: RIGHT TOTAL KNEE ARTHROPLASTY;  Surgeon: Ollen Gross, MD;  Location: [facility] ORS;  Service: Orthopedics;  Laterality: Right;    VAGINAL HYSTERECTOMY  age 70s    REVIEW OF SYSTEMS:   Review of Systems  Constitutional: Negative for appetite change, chills, fatigue, fever and unexpected weight change.  HENT:   Negative for mouth sores, nosebleeds, sore throat and trouble swallowing.   Eyes: Negative for eye problems and icterus.  Respiratory: Negative for cough, hemoptysis, shortness of breath and wheezing.   Cardiovascular: Negative for chest pain and leg swelling.  Gastrointestinal: Negative for abdominal pain, constipation, diarrhea, nausea and vomiting.  Genitourinary: Negative for bladder incontinence, difficulty urinating, dysuria, frequency and hematuria.   Musculoskeletal: Negative for  back pain, gait problem, neck pain and neck stiffness.  Skin: Improving radiation burn on her left supraclavicular lymph node. Neurological: Negative for dizziness, extremity weakness, gait problem, headaches, light-headedness and seizures.  Hematological: Negative for adenopathy. Does not bruise/bleed easily.  Psychiatric/Behavioral: Negative for confusion, depression and sleep disturbance. The patient is not nervous/anxious.     PHYSICAL EXAMINATION:  Blood pressure 129/74, pulse 77, temperature 97.8 F (36.6 C), temperature source Temporal, resp. rate 18, weight 175 lb 12.8 oz (79.7 kg), SpO2 100%.  ECOG PERFORMANCE STATUS: 1  Physical Exam  Constitutional: Oriented to person, place, and time and well-developed, well-nourished, and in no distress. No distress.  HENT:  Head: Normocephalic and atraumatic.  Mouth/Throat: Oropharynx is clear and moist. No oropharyngeal exudate.  Eyes: Conjunctivae are normal. Right eye exhibits no discharge. Left eye exhibits no discharge. No scleral icterus.  Neck: Normal range of motion. Neck supple.  Cardiovascular: Normal rate, regular rhythm, normal heart sounds and intact distal pulses.  Pulmonary/Chest: Effort normal and breath sounds normal. No respiratory distress. No wheezes. No rales.  Abdominal: Soft. Bowel sounds are normal. Exhibits no distension and no mass. There is no tenderness.  Musculoskeletal: Normal range of motion. Exhibits no edema.  Lymphadenopathy:  Positive for left supraclavicular lymphadenopathy. Neurological: Alert and oriented to person, place, and time. Exhibits normal muscle tone. Gait normal. Coordination normal.  Skin: Positive for radiation burn on her left supraclavicular area without any skin breakdown.  Skin is warm and dry. Not diaphoretic. No erythema. No pallor.  Psychiatric: Mood, memory and judgment normal.  Vitals reviewed.  LABORATORY DATA: Lab Results  Component Value Date   WBC 2.3 (L) 11/28/2023    HGB 12.4 11/28/2023   HCT 36.1 11/28/2023   MCV 90.7 11/28/2023   PLT 222 11/28/2023      Chemistry      Component Value Date/Time   NA 141 11/28/2023 1252   NA 139 10/19/2022 1030   K 3.6 11/28/2023 1252   CL 104 11/28/2023 1252   CO2 30 11/28/2023 1252   BUN 11 11/28/2023 1252   BUN 13 10/19/2022 1030   CREATININE 0.92 11/28/2023 1252      Component Value Date/Time   CALCIUM 9.8 11/28/2023 1252   ALKPHOS 78 11/28/2023 1252   AST 26 11/28/2023 1252   ALT 16 11/28/2023 1252   BILITOT 0.5 11/28/2023 1252       RADIOGRAPHIC STUDIES:  CT Soft Tissue Neck W Contrast Result Date: 11/25/2023 CLINICAL DATA:  Lung carcinoma metastatic disease evaluation. Status post chemotherapy and radiation treatment. History of radiation burn to the left neck. EXAM: CT NECK WITH CONTRAST TECHNIQUE: Multidetector CT imaging of the neck was performed using the standard protocol following the bolus administration of intravenous contrast. RADIATION DOSE REDUCTION: This exam was performed according to the departmental dose-optimization program which includes automated exposure control, adjustment of the mA and/or kV according to patient size and/or use of iterative reconstruction technique. CONTRAST:  75mL OMNIPAQUE IOHEXOL 300 MG/ML  SOLN COMPARISON:  None Available. FINDINGS: PHARYNX AND LARYNX: The nasopharynx, oropharynx and larynx are normal. Visible portions of the oral cavity, tongue base and floor of mouth are normal. Normal epiglottis, vallecula and pyriform sinuses. The larynx is normal. No retropharyngeal abscess, effusion or lymphadenopathy. SALIVARY GLANDS: Normal parotid, submandibular and sublingual glands. THYROID: Normal. LYMPH NODES: There is a centrally necrotic left supraclavicular lymph node measuring 2.3 cm (2:73). No other enlarged or abnormal density nodes. VASCULAR: Major cervical vessels are patent. LIMITED INTRACRANIAL: Normal. VISUALIZED ORBITS: Ocular lens replacements. MASTOIDS AND  VISUALIZED PARANASAL SINUSES: No fluid levels or advanced mucosal thickening. No mastoid effusion. SKELETON: No bony spinal canal stenosis. No lytic or blastic lesions. UPPER CHEST: Pulmonary nodules are more completely characterized on concomitant CT chest. There is a left lower lobe nodule that measures 1.3 cm. OTHER: None. IMPRESSION: 1. Centrally necrotic left supraclavicular lymph node measuring 2.3 cm, consistent with metastatic disease. 2. Pulmonary nodules are more completely characterized on concomitant CT chest. Electronically Signed   By: Deatra Robinson M.D.   On: 11/25/2023 21:30   CT Chest W Contrast Addendum Date: 11/21/2023 ADDENDUM REPORT: 11/21/2023 12:39 ADDENDUM: Stable asymmetric breast tissue on the right. Please correlate with prior workup. Electronically Signed   By: Karen Kays M.D.   On: 11/21/2023 12:39   Result Date: 11/21/2023 CLINICAL DATA:  Non-small-cell lung cancer. Assess treatment response. Prior XRT and chemotherapy. * Tracking Code: BO * EXAM: CT CHEST WITH CONTRAST TECHNIQUE: Multidetector CT  imaging of the chest was performed during intravenous contrast administration. RADIATION DOSE REDUCTION: This exam was performed according to the departmental dose-optimization program which includes automated exposure control, adjustment of the mA and/or kV according to patient size and/or use of iterative reconstruction technique. CONTRAST:  75mL OMNIPAQUE IOHEXOL 300 MG/ML  SOLN COMPARISON:  PET CT scan 07/06/2023.  Standard CT 06/25/2023. FINDINGS: Cardiovascular: Heart is nonenlarged. No pericardial effusion. The thoracic aorta has a normal course and caliber with mild atherosclerotic plaque. Bovine type aortic arch, normal variant. Mediastinum/Nodes: No specific abnormal lymph node enlargement identified in the axillary regions, hilum or mediastinum. Supraclavicular lesion on left side previously measuring 3.0 x 2.4 cm, today measures 3.3 by 2.2 cm, similar when adjusting for  technique on series 4, image 7. Lungs/Pleura: No pneumothorax or effusion. There is some basilar atelectasis. Lower lobe bronchiectasis identified. Left-sided lung nodules are again seen. These include multiple foci in the left lower lobe. The lesion seen in the superior segment left lower lobe on the prior measuring 15 x 10 mm, today measures 15 x 9 mm on series 3, image 48. Focus seen just caudal which previously measured 12 x 11 mm, today on series 3, image 64 measures 11 by 8 mm. Focus left lower lobe series 3, image 108 today measures 6 mm and is unchanged from previous. The semi-solid ground-glass nodular focus along the medial right upper lobe previously measuring 13 mm, today on series 3, image 41 when measured in same fashion measures 11 mm. Calcified right upper lobe nodule anteriorly on series 3, image 44 is stable as well. No new dominant lung nodule. No consolidation. Upper Abdomen: Fatty liver infiltration identified. Once again additional areas of more focal fat deposition seen in segment 4 there is also what may be a hemangioma in segment 8. Previous cholecystectomy. Preserved adrenal gland on the right. Stable left adrenal nodules. Please correlate with prior workup and attention on follow-up. Bosniak 1 upper pole right-sided renal cysts again seen. Colonic diverticula. Musculoskeletal: Mild degenerative changes along the spine. Lipoma along the right subscapularis muscle is stable. IMPRESSION: Overall appearance is similar to previous examination. Multiple left-sided lower lobe lung nodules are essentially stable as has a small ground-glass nodule in the right upper lobe. By the supraclavicular left-sided lymph node is stable. No new nodal enlargement. Fatty liver infiltration with a some areas of additional fatty infiltration as well as what may be a hepatic hemangioma. Stable left adrenal nodules. Aortic Atherosclerosis (ICD10-I70.0). Electronically Signed: By: Karen Kays M.D. On: 11/21/2023  11:44     ASSESSMENT/PLAN:  This is a very pleasant 82 year old African-American female diagnosed with Stage IIIC (T3, N3, M0) non-small cell lung cancer, adenocarcinoma presented with supraclavicular lymphadenopathy, 2 left lung nodules diagnosed in September 2024.  There is also a short segment of wall thickening involving the splenic flexure of the colon with associated mild hypermetabolism which is nonspecific and colonoscopy was recommended.  Molecular studies showed positive KRAS G12C mutation and PD-L1 expression of 5%. The patient completed a course of concurrent chemoradiation with weekly carboplatin for AUC of 2 and paclitaxel 45 Mg/M2 started on September 20, 2023.  She is status post 6 week of treatment.  Last day chemotherapy was on 10/24/23.    The patient was seen with Dr. Arbutus Ped today.  Dr. Arbutus Ped personally and independently reviewed the scan and discussed results with the patient today.  The scan showed no evidence of disease progression. Her disease is stable. Her left supraclavicular  lymph node is necrotic.   Dr. Arbutus Ped recommended consolidation immunotherapy with Imfinzi 1500 mg IV every 4 weeks.  The patient is interested in this option and she is expected to start her first cycle of treatment on 12/05/2023.  I discussed with her the adverse effect of the immunotherapy including but not limited to immunotherapy mediated skin rash, diarrhea, inflammation of the lung, kidney, liver, thyroid or other endocrine dysfunction  We will see her back for follow-up visit in 5 weeks for evaluation repeat blood work before undergoing cycle #2.  She will follow-up with GI 12/20/2023 as planned due to the abnormality on her PET scan.  Discussed that if they recommend procedures such as colonoscopy that we are in agreement with this and that is okay while she is on immunotherapy.  The patient was advised to call immediately if she has any concerning symptoms in the interval. The patient  voices understanding of current disease status and treatment options and is in agreement with the current care plan. All questions were answered. The patient knows to call the clinic with any problems, questions or concerns. We can certainly see the patient much sooner if necessary      Orders Placed This Encounter  Procedures   CBC with Differential (Cancer Center Only)    Standing Status:   Future    Expected Date:   12/05/2023    Expiration Date:   12/04/2024   CMP (Cancer Center only)    Standing Status:   Future    Expected Date:   12/05/2023    Expiration Date:   12/04/2024   T4    Standing Status:   Future    Expected Date:   12/05/2023    Expiration Date:   12/04/2024   TSH    Standing Status:   Future    Expected Date:   12/05/2023    Expiration Date:   12/04/2024   CBC with Differential (Cancer Center Only)    Standing Status:   Future    Expected Date:   01/02/2024    Expiration Date:   01/01/2025   CMP (Cancer Center only)    Standing Status:   Future    Expected Date:   01/02/2024    Expiration Date:   01/01/2025   CBC with Differential (Cancer Center Only)    Standing Status:   Future    Expected Date:   01/30/2024    Expiration Date:   01/29/2025   CMP (Cancer Center only)    Standing Status:   Future    Expected Date:   01/30/2024    Expiration Date:   01/29/2025   T4    Standing Status:   Future    Expected Date:   01/30/2024    Expiration Date:   01/29/2025   TSH    Standing Status:   Future    Expected Date:   01/30/2024    Expiration Date:   01/29/2025   CBC with Differential (Cancer Center Only)    Standing Status:   Future    Expected Date:   02/27/2024    Expiration Date:   02/26/2025   CMP (Cancer Center only)    Standing Status:   Future    Expected Date:   02/27/2024    Expiration Date:   02/26/2025   CBC with Differential (Cancer Center Only)    Standing Status:   Future    Expected Date:   03/26/2024    Expiration Date:   03/26/2025  CMP (Cancer  Center only)    Standing Status:   Future    Expected Date:   03/26/2024    Expiration Date:   03/26/2025   CBC with Differential (Cancer Center Only)    Standing Status:   Future    Expected Date:   04/23/2024    Expiration Date:   04/23/2025   CMP (Cancer Center only)    Standing Status:   Future    Expected Date:   04/23/2024    Expiration Date:   04/23/2025   T4    Standing Status:   Future    Expected Date:   04/23/2024    Expiration Date:   04/23/2025   TSH    Standing Status:   Future    Expected Date:   04/23/2024    Expiration Date:   04/23/2025   CBC with Differential (Cancer Center Only)    Standing Status:   Future    Expected Date:   05/21/2024    Expiration Date:   05/21/2025   CMP (Cancer Center only)    Standing Status:   Future    Expected Date:   05/21/2024    Expiration Date:   05/21/2025   CBC with Differential (Cancer Center Only)    Standing Status:   Future    Expected Date:   06/18/2024    Expiration Date:   06/18/2025   CMP (Cancer Center only)    Standing Status:   Future    Expected Date:   06/18/2024    Expiration Date:   06/18/2025   CBC with Differential (Cancer Center Only)    Standing Status:   Future    Expected Date:   07/16/2024    Expiration Date:   07/16/2025   CMP (Cancer Center only)    Standing Status:   Future    Expected Date:   07/16/2024    Expiration Date:   07/16/2025   T4    Standing Status:   Future    Expected Date:   07/16/2024    Expiration Date:   07/16/2025   TSH    Standing Status:   Future    Expected Date:   07/16/2024    Expiration Date:   07/16/2025   CBC with Differential (Cancer Center Only)    Standing Status:   Future    Expected Date:   08/13/2024    Expiration Date:   08/13/2025   CMP (Cancer Center only)    Standing Status:   Future    Expected Date:   08/13/2024    Expiration Date:   08/13/2025   CBC with Differential (Cancer Center Only)    Standing Status:   Future    Expected Date:   09/10/2024    Expiration Date:    09/10/2025   CMP (Cancer Center only)    Standing Status:   Future    Expected Date:   09/10/2024    Expiration Date:   09/10/2025   CBC with Differential (Cancer Center Only)    Standing Status:   Future    Expected Date:   10/08/2024    Expiration Date:   10/08/2025   CMP (Cancer Center only)    Standing Status:   Future    Expected Date:   10/08/2024    Expiration Date:   10/08/2025   T4    Standing Status:   Future    Expected Date:   10/08/2024    Expiration Date:   10/08/2025  TSH    Standing Status:   Future    Expected Date:   10/08/2024    Expiration Date:   10/08/2025   CBC with Differential (Cancer Center Only)    Standing Status:   Future    Expected Date:   11/05/2024    Expiration Date:   11/05/2025   CMP (Cancer Center only)    Standing Status:   Future    Expected Date:   11/05/2024    Expiration Date:   11/05/2025   T4    Standing Status:   Future    Expected Date:   11/05/2024    Expiration Date:   11/05/2025   TSH    Standing Status:   Future    Expected Date:   11/05/2024    Expiration Date:   11/05/2025      Shandora Koogler L Suede Greenawalt, PA-C 11/28/23  ADDENDUM: Hematology/Oncology Attending: I had a face-to-face encounter with the patient today.  I reviewed her record, lab, scan and recommended her care plan.  This is a very pleasant 82 years old white female with a stage IIIc non-small cell lung cancer, adenocarcinoma diagnosed in September 2024 with positive KRAS G12C mutation and PD-L1 expression of 5% status post a course of concurrent chemoradiation with weekly carboplatin and paclitaxel.  The patient tolerated her treatment well except for fatigue.  She had repeat CT scan of the chest performed recently.  I personally and independently reviewed the scan and discussed the result with the patient and her husband. Her scan showed stable disease with centrally necrotic left supraclavicular lymph node. I recommended for the patient to proceed with consolidation  treatment with immunotherapy with Imfinzi 1500 Mg IV every 4 weeks for 1 year.  I discussed with the patient the adverse effect of the immunotherapy including but not limited to immunotherapy mediated skin rash, diarrhea, inflammation of the lung, kidney, liver, liver or thyroid dysfunction including type 1 diabetes mellitus.  The patient is in agreement with this plan. She is expected to start the first cycle of her treatment next week. She will come back for follow-up visit in 5 weeks for evaluation before the next cycle of her treatment. She was advised to call immediately if she has any other concerning symptoms in the interval. The total time spent in the appointment was 30 minutes. Disclaimer: This note was dictated with voice recognition software. Similar sounding words can inadvertently be transcribed and may be missed upon review. Lajuana Matte, MD

## 2023-11-28 ENCOUNTER — Other Ambulatory Visit: Payer: Self-pay

## 2023-11-28 ENCOUNTER — Inpatient Hospital Stay: Payer: Medicare Other | Attending: Physician Assistant

## 2023-11-28 ENCOUNTER — Inpatient Hospital Stay (HOSPITAL_BASED_OUTPATIENT_CLINIC_OR_DEPARTMENT_OTHER): Payer: Medicare Other | Admitting: Physician Assistant

## 2023-11-28 VITALS — BP 129/74 | HR 77 | Temp 97.8°F | Resp 18 | Wt 175.8 lb

## 2023-11-28 DIAGNOSIS — Z9221 Personal history of antineoplastic chemotherapy: Secondary | ICD-10-CM | POA: Insufficient documentation

## 2023-11-28 DIAGNOSIS — Z79899 Other long term (current) drug therapy: Secondary | ICD-10-CM | POA: Insufficient documentation

## 2023-11-28 DIAGNOSIS — K760 Fatty (change of) liver, not elsewhere classified: Secondary | ICD-10-CM | POA: Insufficient documentation

## 2023-11-28 DIAGNOSIS — Z5112 Encounter for antineoplastic immunotherapy: Secondary | ICD-10-CM | POA: Diagnosis not present

## 2023-11-28 DIAGNOSIS — N6489 Other specified disorders of breast: Secondary | ICD-10-CM | POA: Insufficient documentation

## 2023-11-28 DIAGNOSIS — Z9049 Acquired absence of other specified parts of digestive tract: Secondary | ICD-10-CM | POA: Diagnosis not present

## 2023-11-28 DIAGNOSIS — N281 Cyst of kidney, acquired: Secondary | ICD-10-CM | POA: Insufficient documentation

## 2023-11-28 DIAGNOSIS — Z90710 Acquired absence of both cervix and uterus: Secondary | ICD-10-CM | POA: Diagnosis not present

## 2023-11-28 DIAGNOSIS — Z923 Personal history of irradiation: Secondary | ICD-10-CM | POA: Diagnosis not present

## 2023-11-28 DIAGNOSIS — I252 Old myocardial infarction: Secondary | ICD-10-CM | POA: Insufficient documentation

## 2023-11-28 DIAGNOSIS — R1310 Dysphagia, unspecified: Secondary | ICD-10-CM | POA: Insufficient documentation

## 2023-11-28 DIAGNOSIS — Z79620 Long term (current) use of immunosuppressive biologic: Secondary | ICD-10-CM | POA: Diagnosis not present

## 2023-11-28 DIAGNOSIS — K5730 Diverticulosis of large intestine without perforation or abscess without bleeding: Secondary | ICD-10-CM | POA: Diagnosis not present

## 2023-11-28 DIAGNOSIS — Z7189 Other specified counseling: Secondary | ICD-10-CM | POA: Diagnosis not present

## 2023-11-28 DIAGNOSIS — I700 Atherosclerosis of aorta: Secondary | ICD-10-CM | POA: Insufficient documentation

## 2023-11-28 DIAGNOSIS — C3412 Malignant neoplasm of upper lobe, left bronchus or lung: Secondary | ICD-10-CM | POA: Diagnosis not present

## 2023-11-28 DIAGNOSIS — C3482 Malignant neoplasm of overlapping sites of left bronchus and lung: Secondary | ICD-10-CM | POA: Diagnosis not present

## 2023-11-28 DIAGNOSIS — J479 Bronchiectasis, uncomplicated: Secondary | ICD-10-CM | POA: Insufficient documentation

## 2023-11-28 LAB — CMP (CANCER CENTER ONLY)
ALT: 16 U/L (ref 0–44)
AST: 26 U/L (ref 15–41)
Albumin: 3.9 g/dL (ref 3.5–5.0)
Alkaline Phosphatase: 78 U/L (ref 38–126)
Anion gap: 7 (ref 5–15)
BUN: 11 mg/dL (ref 8–23)
CO2: 30 mmol/L (ref 22–32)
Calcium: 9.8 mg/dL (ref 8.9–10.3)
Chloride: 104 mmol/L (ref 98–111)
Creatinine: 0.92 mg/dL (ref 0.44–1.00)
GFR, Estimated: >60 mL/min (ref >60)
Glucose, Bld: 97 mg/dL (ref 70–99)
Potassium: 3.6 mmol/L (ref 3.5–5.1)
Sodium: 141 mmol/L (ref 135–145)
Total Bilirubin: 0.5 mg/dL (ref <1.2)
Total Protein: 6.2 g/dL — ABNORMAL LOW (ref 6.5–8.1)

## 2023-11-28 LAB — CBC WITH DIFFERENTIAL (CANCER CENTER ONLY)
Abs Immature Granulocytes: 0.01 10*3/uL (ref 0.00–0.07)
Basophils Absolute: 0 10*3/uL (ref 0.0–0.1)
Basophils Relative: 1 %
Eosinophils Absolute: 0.1 10*3/uL (ref 0.0–0.5)
Eosinophils Relative: 2 %
HCT: 36.1 % (ref 36.0–46.0)
Hemoglobin: 12.4 g/dL (ref 12.0–15.0)
Immature Granulocytes: 0 %
Lymphocytes Relative: 22 %
Lymphs Abs: 0.5 10*3/uL — ABNORMAL LOW (ref 0.7–4.0)
MCH: 31.2 pg (ref 26.0–34.0)
MCHC: 34.3 g/dL (ref 30.0–36.0)
MCV: 90.7 fL (ref 80.0–100.0)
Monocytes Absolute: 0.4 10*3/uL (ref 0.1–1.0)
Monocytes Relative: 19 %
Neutro Abs: 1.3 10*3/uL — ABNORMAL LOW (ref 1.7–7.7)
Neutrophils Relative %: 56 %
Platelet Count: 222 10*3/uL (ref 150–400)
RBC: 3.98 MIL/uL (ref 3.87–5.11)
RDW: 15.9 % — ABNORMAL HIGH (ref 11.5–15.5)
WBC Count: 2.3 10*3/uL — ABNORMAL LOW (ref 4.0–10.5)
nRBC: 0 % (ref 0.0–0.2)

## 2023-11-28 NOTE — Progress Notes (Signed)
DISCONTINUE OFF PATHWAY REGIMEN - Non-Small Cell Lung   [id number]:Carboplatin AUC=2 IV D1 + Paclitaxel 45 mg/m2 IV D1 q7 Days + RT:   A cycle is every 7 days, concurrent with RT:     Paclitaxel      Carboplatin   **Always confirm dose/schedule in your pharmacy ordering system**  PRIOR TREATMENT: Off Pathway: Carboplatin AUC=2 IV D1 + Paclitaxel 45 mg/m2 IV D1 q7 Days + RT  START ON PATHWAY REGIMEN - Non-Small Cell Lung     A cycle is every 28 days:     Durvalumab   **Always confirm dose/schedule in your pharmacy ordering system**  Patient Characteristics: Preoperative or Nonsurgical Candidate (Clinical Staging), Stage III - Nonsurgical Candidate (Nonsquamous and Squamous), PS = 0,1, EGFR Mutation - Common (Exon 19 Deletion or Exon 21 L858R Substitution) Negative Therapeutic Status: Preoperative or Nonsurgical Candidate (Clinical Staging) AJCC T Category: cT3 AJCC N Category: cN3 AJCC M Category: cM0 AJCC 8 Stage Grouping: IIIC ECOG Performance Status: 1 EGFR Mutation - Common (Exon 19 Deletion or Exon 21 L858R Substitution): Negative Intent of Therapy: Curative Intent, Discussed with Patient

## 2023-11-28 NOTE — Patient Instructions (Addendum)
-  We covered a lot of important information at your appointment today regarding what the treatment plan is moving forward. Here are the the main points that were discussed at your office visit with [place] today:  -The treatment will consist of a new medication. This is not chemotherapy. This new drug is a type of Immunotherapy called Imfinzi (Durvalumab).  -We are planning on starting your treatment next week.  -Your treatment will be given once every 4 weeks. You will receive this treatment every four weeks for a total of 1 year (13 total treatments) unless you experience unacceptable toxicity or if there is evidence on your routine CT scans that the cancer is growing  -We will get a CT scan after every 3 treatments to check on the progress of treatment  Side Effects:  -The adverse effect of the immunotherapy including but not limited to immunotherapy mediated skin rash, diarrhea, inflammation of the lung, kidney, liver, thyroid or other endocrine dysfunction  Follow up:  -We will see you back for a follow up visit in 5 week with week #2

## 2023-11-30 ENCOUNTER — Other Ambulatory Visit: Payer: Self-pay

## 2023-12-01 ENCOUNTER — Encounter: Payer: Self-pay | Admitting: Internal Medicine

## 2023-12-02 ENCOUNTER — Encounter (HOSPITAL_COMMUNITY): Payer: Self-pay | Admitting: Family Medicine

## 2023-12-05 ENCOUNTER — Other Ambulatory Visit: Payer: Medicare Other

## 2023-12-05 ENCOUNTER — Inpatient Hospital Stay: Payer: Medicare Other

## 2023-12-05 VITALS — BP 148/70 | HR 70 | Temp 98.1°F | Resp 16 | Ht 65.0 in | Wt 173.5 lb

## 2023-12-05 DIAGNOSIS — Z5112 Encounter for antineoplastic immunotherapy: Secondary | ICD-10-CM | POA: Diagnosis not present

## 2023-12-05 DIAGNOSIS — J479 Bronchiectasis, uncomplicated: Secondary | ICD-10-CM | POA: Diagnosis not present

## 2023-12-05 DIAGNOSIS — I700 Atherosclerosis of aorta: Secondary | ICD-10-CM | POA: Diagnosis not present

## 2023-12-05 DIAGNOSIS — R1310 Dysphagia, unspecified: Secondary | ICD-10-CM | POA: Diagnosis not present

## 2023-12-05 DIAGNOSIS — C3412 Malignant neoplasm of upper lobe, left bronchus or lung: Secondary | ICD-10-CM

## 2023-12-05 DIAGNOSIS — C3482 Malignant neoplasm of overlapping sites of left bronchus and lung: Secondary | ICD-10-CM | POA: Diagnosis not present

## 2023-12-05 DIAGNOSIS — I252 Old myocardial infarction: Secondary | ICD-10-CM | POA: Diagnosis not present

## 2023-12-05 LAB — CBC WITH DIFFERENTIAL (CANCER CENTER ONLY)
Abs Immature Granulocytes: 0.01 10*3/uL (ref 0.00–0.07)
Basophils Absolute: 0 10*3/uL (ref 0.0–0.1)
Basophils Relative: 1 %
Eosinophils Absolute: 0.1 10*3/uL (ref 0.0–0.5)
Eosinophils Relative: 3 %
HCT: 36.9 % (ref 36.0–46.0)
Hemoglobin: 12.6 g/dL (ref 12.0–15.0)
Immature Granulocytes: 0 %
Lymphocytes Relative: 17 %
Lymphs Abs: 0.4 10*3/uL — ABNORMAL LOW (ref 0.7–4.0)
MCH: 31.1 pg (ref 26.0–34.0)
MCHC: 34.1 g/dL (ref 30.0–36.0)
MCV: 91.1 fL (ref 80.0–100.0)
Monocytes Absolute: 0.4 10*3/uL (ref 0.1–1.0)
Monocytes Relative: 15 %
Neutro Abs: 1.5 10*3/uL — ABNORMAL LOW (ref 1.7–7.7)
Neutrophils Relative %: 64 %
Platelet Count: 210 10*3/uL (ref 150–400)
RBC: 4.05 MIL/uL (ref 3.87–5.11)
RDW: 15.9 % — ABNORMAL HIGH (ref 11.5–15.5)
WBC Count: 2.4 10*3/uL — ABNORMAL LOW (ref 4.0–10.5)
nRBC: 0 % (ref 0.0–0.2)

## 2023-12-05 LAB — CMP (CANCER CENTER ONLY)
ALT: 14 U/L (ref 0–44)
AST: 25 U/L (ref 15–41)
Albumin: 3.7 g/dL (ref 3.5–5.0)
Alkaline Phosphatase: 60 U/L (ref 38–126)
Anion gap: 6 (ref 5–15)
BUN: 12 mg/dL (ref 8–23)
CO2: 26 mmol/L (ref 22–32)
Calcium: 9.4 mg/dL (ref 8.9–10.3)
Chloride: 108 mmol/L (ref 98–111)
Creatinine: 0.8 mg/dL (ref 0.44–1.00)
GFR, Estimated: >60 mL/min (ref >60)
Glucose, Bld: 150 mg/dL — ABNORMAL HIGH (ref 70–99)
Potassium: 3.1 mmol/L — ABNORMAL LOW (ref 3.5–5.1)
Sodium: 140 mmol/L (ref 135–145)
Total Bilirubin: 0.4 mg/dL (ref 0.0–1.2)
Total Protein: 6.3 g/dL — ABNORMAL LOW (ref 6.5–8.1)

## 2023-12-05 LAB — TSH: TSH: 2.075 u[IU]/mL (ref 0.350–4.500)

## 2023-12-05 MED ORDER — DURVALUMAB 500 MG/10ML IV SOLN
1500.0000 mg | Freq: Once | INTRAVENOUS | Status: AC
  Administered 2023-12-05: 1500 mg via INTRAVENOUS
  Filled 2023-12-05: qty 30

## 2023-12-05 MED ORDER — SODIUM CHLORIDE 0.9 % IV SOLN
INTRAVENOUS | Status: DC
Start: 2023-12-05 — End: 2023-12-05

## 2023-12-05 NOTE — Patient Instructions (Signed)
[facility]  Discharge Instructions: Thank you for choosing [facility] to provide your oncology and hematology care.   If you have a lab appointment with the Cancer Center, please go directly to the Cancer Center and check in at the registration area.   Wear comfortable clothing and clothing appropriate for easy access to any Portacath or PICC line.   We strive to give you quality time with your provider. You may need to reschedule your appointment if you arrive late (15 or more minutes).  Arriving late affects you and other patients whose appointments are after yours.  Also, if you miss three or more appointments without notifying the office, you may be dismissed from the clinic at the provider's discretion.      For prescription refill requests, have your pharmacy contact our office and allow 72 hours for refills to be completed.    Today you received the following chemotherapy and/or immunotherapy agents Imfinzi (Durvalumab)   To help prevent nausea and vomiting after your treatment, we encourage you to take your nausea medication as directed.  BELOW ARE SYMPTOMS THAT SHOULD BE REPORTED IMMEDIATELY: *FEVER GREATER THAN 100.4 F (38 C) OR HIGHER *CHILLS OR SWEATING *NAUSEA AND VOMITING THAT IS NOT CONTROLLED WITH YOUR NAUSEA MEDICATION *UNUSUAL SHORTNESS OF BREATH *UNUSUAL BRUISING OR BLEEDING *URINARY PROBLEMS (pain or burning when urinating, or frequent urination) *BOWEL PROBLEMS (unusual diarrhea, constipation, pain near the anus) TENDERNESS IN MOUTH AND THROAT WITH OR WITHOUT PRESENCE OF ULCERS (sore throat, sores in mouth, or a toothache) UNUSUAL RASH, SWELLING OR PAIN  UNUSUAL VAGINAL DISCHARGE OR ITCHING   Items with * indicate a potential emergency and should be followed up as soon as possible or go to the Emergency Department if any problems should occur.  Please show the CHEMOTHERAPY ALERT CARD or  IMMUNOTHERAPY ALERT CARD at check-in to the Emergency Department and triage nurse.  Should you have questions after your visit or need to cancel or reschedule your appointment, please contact [facility] - A [facility]  Dept: [phone]  and follow the prompts.  Office hours are 8:00 a.m. to 4:30 p.m. Monday - Friday. Please note that voicemails left after 4:00 p.m. may not be returned until the following business day.  We are closed weekends and major holidays. You have access to a nurse at all times for urgent questions. Please call the main number to the clinic Dept: [phone] and follow the prompts.   For any non-urgent questions, you may also contact your provider using MyChart. We now offer e-Visits for anyone 71 and older to request care online for non-urgent symptoms. For details visit [email or web address].   Also download the MyChart app! Go to the app store, search "MyChart", open the app, select [place], and log in with your MyChart username and password.  Durvalumab Injection What is this medication? DURVALUMAB (dur VAL ue mab) treats some types of cancer. It works by helping your immune system slow or stop the spread of cancer cells. It is a monoclonal antibody. This medicine may be used for other purposes; ask your health care provider or pharmacist if you have questions. COMMON BRAND NAME(S): IMFINZI What should I tell my care team before I take this medication? They need to know if you have any of these conditions: Allogeneic stem cell transplant (uses someone else's stem cells) Autoimmune diseases, such  as Crohn disease, ulcerative colitis, lupus History of chest radiation Nervous system problems, such as Guillain-Barre syndrome, myasthenia gravis Organ transplant An unusual or allergic reaction to durvalumab, other medications, foods, dyes, or preservatives Pregnant or trying to get pregnant Breast-feeding How should I use  this medication? This medication is infused into a vein. It is given by your care team in a hospital or clinic setting. A special MedGuide will be given to you before each treatment. Be sure to read this information carefully each time. Talk to your care team about the use of this medication in children. Special care may be needed. Overdosage: If you think you have taken too much of this medicine contact a poison control center or emergency room at once. NOTE: This medicine is only for you. Do not share this medicine with others. What if I miss a dose? Keep appointments for follow-up doses. It is important not to miss your dose. Call your care team if you are unable to keep an appointment. What may interact with this medication? Interactions have not been studied. This list may not describe all possible interactions. Give your health care provider a list of all the medicines, herbs, non-prescription drugs, or dietary supplements you use. Also tell them if you smoke, drink alcohol, or use illegal drugs. Some items may interact with your medicine. What should I watch for while using this medication? Your condition will be monitored carefully while you are receiving this medication. You may need blood work while taking this medication. This medication may cause serious skin reactions. They can happen weeks to months after starting the medication. Contact your care team right away if you notice fevers or flu-like symptoms with a rash. The rash may be red or purple and then turn into blisters or peeling of the skin. You may also notice a red rash with swelling of the face, lips, or lymph nodes in your neck or under your arms. Tell your care team right away if you have any change in your eyesight. Talk to your care team if you may be pregnant. Serious birth defects can occur if you take this medication during pregnancy and for 3 months after the last dose. You will need a negative pregnancy test before  starting this medication. Contraception is recommended while taking this medication and for 3 months after the last dose. Your care team can help you find the option that works for you. Do not breastfeed while taking this medication and for 3 months after the last dose. What side effects may I notice from receiving this medication? Side effects that you should report to your care team as soon as possible: Allergic reactions--skin rash, itching, hives, swelling of the face, lips, tongue, or throat Dry cough, shortness of breath or trouble breathing Eye pain, redness, irritation, or discharge with blurry or decreased vision Heart muscle inflammation--unusual weakness or fatigue, shortness of breath, chest pain, fast or irregular heartbeat, dizziness, swelling of the ankles, feet, or hands Hormone gland problems--headache, sensitivity to light, unusual weakness or fatigue, dizziness, fast or irregular heartbeat, increased sensitivity to cold or heat, excessive sweating, constipation, hair loss, increased thirst or amount of urine, tremors or shaking, irritability Infusion reactions--chest pain, shortness of breath or trouble breathing, feeling faint or lightheaded Kidney injury (glomerulonephritis)--decrease in the amount of urine, red or dark brown urine, foamy or bubbly urine, swelling of the ankles, hands, or feet Liver injury--right upper belly pain, loss of appetite, nausea, light-colored stool, dark yellow or brown urine,  yellowing skin or eyes, unusual weakness or fatigue Pain, tingling, or numbness in the hands or feet, muscle weakness, change in vision, confusion or trouble speaking, loss of balance or coordination, trouble walking, seizures Rash, fever, and swollen lymph nodes Redness, blistering, peeling, or loosening of the skin, including inside the mouth Sudden or severe stomach pain, bloody diarrhea, fever, nausea, vomiting Side effects that usually do not require medical attention  (report these to your care team if they continue or are bothersome): Bone, joint, or muscle pain Diarrhea Fatigue Loss of appetite Nausea Skin rash This list may not describe all possible side effects. Call your doctor for medical advice about side effects. You may report side effects to FDA at 1-800-FDA-1088. Where should I keep my medication? This medication is given in a hospital or clinic. It will not be stored at home. NOTE: This sheet is a summary. It may not cover all possible information. If you have questions about this medicine, talk to your doctor, pharmacist, or health care provider.  2024 Elsevier/Gold Standard (2022-04-06 00:00:00)

## 2023-12-06 LAB — T4: T4, Total: 8.5 ug/dL (ref 4.5–12.0)

## 2023-12-20 ENCOUNTER — Ambulatory Visit: Payer: Medicare Other | Admitting: Physician Assistant

## 2023-12-21 ENCOUNTER — Other Ambulatory Visit: Payer: Self-pay

## 2023-12-21 ENCOUNTER — Telehealth: Payer: Self-pay

## 2023-12-21 DIAGNOSIS — C3412 Malignant neoplasm of upper lobe, left bronchus or lung: Secondary | ICD-10-CM

## 2023-12-21 DIAGNOSIS — R948 Abnormal results of function studies of other organs and systems: Secondary | ICD-10-CM

## 2023-12-21 NOTE — Telephone Encounter (Signed)
 Patient called and stated that our office put in a referral for GI.  She made an appt with  GI per referral.  Patient called and asked if she could see Dr. Feliberto Hopping at [facility] because she has seen her in the past.  Resent GI referral to [facility] per patient request. Fax confirmed at [phone].

## 2023-12-21 NOTE — Addendum Note (Signed)
 Addended by: Starling Eck B on: 12/21/2023 12:03 PM   Modules accepted: Orders

## 2024-01-02 ENCOUNTER — Inpatient Hospital Stay: Payer: Medicare Other | Attending: Physician Assistant

## 2024-01-02 ENCOUNTER — Inpatient Hospital Stay (HOSPITAL_BASED_OUTPATIENT_CLINIC_OR_DEPARTMENT_OTHER): Payer: Medicare Other | Admitting: Internal Medicine

## 2024-01-02 VITALS — BP 114/78 | HR 71 | Temp 97.3°F | Resp 16 | Ht 65.0 in | Wt 172.7 lb

## 2024-01-02 DIAGNOSIS — Z79899 Other long term (current) drug therapy: Secondary | ICD-10-CM | POA: Diagnosis not present

## 2024-01-02 DIAGNOSIS — M1990 Unspecified osteoarthritis, unspecified site: Secondary | ICD-10-CM | POA: Diagnosis not present

## 2024-01-02 DIAGNOSIS — C3412 Malignant neoplasm of upper lobe, left bronchus or lung: Secondary | ICD-10-CM

## 2024-01-02 DIAGNOSIS — R590 Localized enlarged lymph nodes: Secondary | ICD-10-CM | POA: Diagnosis not present

## 2024-01-02 DIAGNOSIS — Z9049 Acquired absence of other specified parts of digestive tract: Secondary | ICD-10-CM | POA: Diagnosis not present

## 2024-01-02 DIAGNOSIS — C3482 Malignant neoplasm of overlapping sites of left bronchus and lung: Secondary | ICD-10-CM | POA: Diagnosis not present

## 2024-01-02 DIAGNOSIS — Z5112 Encounter for antineoplastic immunotherapy: Secondary | ICD-10-CM | POA: Diagnosis not present

## 2024-01-02 DIAGNOSIS — I10 Essential (primary) hypertension: Secondary | ICD-10-CM | POA: Insufficient documentation

## 2024-01-02 DIAGNOSIS — Z923 Personal history of irradiation: Secondary | ICD-10-CM | POA: Insufficient documentation

## 2024-01-02 DIAGNOSIS — I252 Old myocardial infarction: Secondary | ICD-10-CM | POA: Insufficient documentation

## 2024-01-02 DIAGNOSIS — Z90710 Acquired absence of both cervix and uterus: Secondary | ICD-10-CM | POA: Insufficient documentation

## 2024-01-02 LAB — CMP (CANCER CENTER ONLY)
ALT: 14 U/L (ref 0–44)
AST: 25 U/L (ref 15–41)
Albumin: 3.9 g/dL (ref 3.5–5.0)
Alkaline Phosphatase: 76 U/L (ref 38–126)
Anion gap: 7 (ref 5–15)
BUN: 10 mg/dL (ref 8–23)
CO2: 28 mmol/L (ref 22–32)
Calcium: 9.7 mg/dL (ref 8.9–10.3)
Chloride: 104 mmol/L (ref 98–111)
Creatinine: 0.82 mg/dL (ref 0.44–1.00)
GFR, Estimated: >60 mL/min (ref >60)
Glucose, Bld: 109 mg/dL — ABNORMAL HIGH (ref 70–99)
Potassium: 3.1 mmol/L — ABNORMAL LOW (ref 3.5–5.1)
Sodium: 139 mmol/L (ref 135–145)
Total Bilirubin: 0.4 mg/dL (ref 0.0–1.2)
Total Protein: 6.7 g/dL (ref 6.5–8.1)

## 2024-01-02 LAB — CBC WITH DIFFERENTIAL (CANCER CENTER ONLY)
Abs Immature Granulocytes: 0.01 10*3/uL (ref 0.00–0.07)
Basophils Absolute: 0 10*3/uL (ref 0.0–0.1)
Basophils Relative: 1 %
Eosinophils Absolute: 0.1 10*3/uL (ref 0.0–0.5)
Eosinophils Relative: 4 %
HCT: 40.5 % (ref 36.0–46.0)
Hemoglobin: 13.5 g/dL (ref 12.0–15.0)
Immature Granulocytes: 0 %
Lymphocytes Relative: 13 %
Lymphs Abs: 0.5 10*3/uL — ABNORMAL LOW (ref 0.7–4.0)
MCH: 30.9 pg (ref 26.0–34.0)
MCHC: 33.3 g/dL (ref 30.0–36.0)
MCV: 92.7 fL (ref 80.0–100.0)
Monocytes Absolute: 0.5 10*3/uL (ref 0.1–1.0)
Monocytes Relative: 13 %
Neutro Abs: 2.3 10*3/uL (ref 1.7–7.7)
Neutrophils Relative %: 69 %
Platelet Count: 205 10*3/uL (ref 150–400)
RBC: 4.37 MIL/uL (ref 3.87–5.11)
RDW: 13.5 % (ref 11.5–15.5)
WBC Count: 3.4 10*3/uL — ABNORMAL LOW (ref 4.0–10.5)
nRBC: 0 % (ref 0.0–0.2)

## 2024-01-02 MED ORDER — SODIUM CHLORIDE 0.9 % IV SOLN: INTRAVENOUS | Status: DC

## 2024-01-02 MED ORDER — SODIUM CHLORIDE 0.9 % IV SOLN
1500.0000 mg | Freq: Once | INTRAVENOUS | Status: AC
  Administered 2024-01-02: 1500 mg via INTRAVENOUS
  Filled 2024-01-02: qty 30

## 2024-01-02 NOTE — Progress Notes (Signed)
[facility] Telephone:([phone]   [phone]) [phone]  OFFICE PROGRESS NOTE  Emilio Aspen, MD [address] [place] [address]  DIAGNOSIS: Stage IIIC (T3, N3, M0) non-small cell lung cancer, adenocarcinoma presented with supraclavicular lymphadenopathy, 2 left lung nodules diagnosed in September 2024.  There is also a short segment of wall thickening involving the splenic flexure of the colon with associated mild hypermetabolism which is nonspecific and colonoscopy was recommended.   Biomarker Findings HRD signature - HRDsig Negative Microsatellite status - MS-Equivocal ? Tumor Mutational Burden - 2 Muts/Mb Genomic Findings For a complete list of the genes assayed, please refer to the Appendix. KRAS G12C TP53 R110L 7 Disease relevant genes with no reportable alterations: ALK, BRAF, EGFR, ERBB2, MET, RET, ROS1  PDL1 Expression 5%.  PRIOR THERAPY: Concurrent chemoradiation with carboplatin for an AUC of 2 and taxol 45 mg/m2.  First dose September 20, 2023.  Status post 6 cycles.   CURRENT THERAPY: Consolidation treatment with Imfinzi 1500 Mg IV every 4 weeks status post 1 cycle.   INTERVAL HISTORY: Stephanie Newton 83 y.o. female returns to the clinic today for follow-up visit.Discussed the use of AI scribe software for clinical note transcription with the patient, who gave verbal consent to proceed.  History of Present Illness   The patient, an 83 year old individual with a history of stage IIIC (T3, N3, M0) non-small cell lung cancer, adenocarcinoma presented with supraclavicular lymphadenopathy, 2 left lung nodules diagnosed in September 2024, has been undergoing immunotherapy with Durvalumab (Imfinzi) following a successful course of chemotherapy and radiation therapy approximately six and a half weeks ago. She has completed one cycle of Durvalumab without any new complaints or side effects. She denies experiencing any nausea,  vomiting, diarrhea, rash, itching, chest pain, shortness of breath, or coughing. There has been no recent weight loss, with a minor fluctuation of approximately two pounds attributed to clothing.  The patient has been managing arthritis, which causes persistent pain. She has been taking Aleve at night and using Voltaren for pain management.  The patient has a history of gastrointestinal issues and had an appointment with Dr. Marca Ancona, a gastroenterologist, which was cancelled. She has been trying to reschedule this appointment.       MEDICAL HISTORY: Past Medical History:  Diagnosis Date   Arthritis    Dysrhythmia    Hypercholesteremia    Hypertension    Myocardial infarction [facility])     ALLERGIES:  is allergic to shellfish allergy.  MEDICATIONS:  Current Outpatient Medications  Medication Sig Dispense Refill   amLODipine (NORVASC) 2.5 MG tablet Take 2.5 mg by mouth daily.     aspirin EC 81 MG tablet Take 1 tablet by mouth daily.     azithromycin (ZITHROMAX) 250 MG tablet Take 2 tablets today, then one tablet each day for the next 6 days. 6 tablet 0   estradiol (ESTRACE) 1 MG tablet Take 1 mg by mouth daily.     famotidine (PEPCID) 20 MG tablet Take 20 mg by mouth as needed.     hydrochlorothiazide (HYDRODIURIL) 12.5 MG tablet Take 12.5 mg by mouth daily.      methocarbamol (ROBAXIN) 500 MG tablet Take 1 tablet (500 mg total) by mouth every 6 (six) hours as needed for muscle spasms. 40 tablet 0   metoprolol succinate (TOPROL-XL) 100 MG 24 hr tablet Take 100 mg by mouth daily.     metoprolol succinate (TOPROL-XL) 50 MG 24 hr tablet  Take 1 tablet (50 mg total) by mouth daily. 90 tablet 1   oxyCODONE-acetaminophen (PERCOCET/ROXICET) 5-325 MG tablet Take 1-2 tablets by mouth every 8 (eight) hours as needed for severe pain. 6 tablet 0   potassium chloride SA (KLOR-CON M) 20 MEQ tablet Take 1 tablet (20 mEq total) by mouth daily. 7 tablet 0   prochlorperazine (COMPAZINE) 10 MG tablet Take 1  tablet (10 mg total) by mouth every 6 (six) hours as needed. 30 tablet 2   simvastatin (ZOCOR) 20 MG tablet Take 20 mg by mouth at bedtime.     sucralfate (CARAFATE) 1 g tablet Take 1 tablet (1 g total) by mouth 4 (four) times daily -  with meals and at bedtime. 120 tablet 0   No current facility-administered medications for this visit.    SURGICAL HISTORY:  Past Surgical History:  Procedure Laterality Date   BREAST EXCISIONAL BIOPSY Right    x2   BRONCHIAL BIOPSY  08/30/2023   Procedure: BRONCHIAL BIOPSIES;  Surgeon: Josephine Igo, DO;  Location: [facility];  Service: Pulmonary;;   EYE SURGERY     cataract bilateral    HERNIA REPAIR  06/2000   INCISIONAL VENTRAL ; DR Carman Ching    KNEE ARTHROSCOPY Left 08/23/2017   dr Thomasena Edis    LAPAROSCOPIC CHOLECYSTECTOMY  06/13/2000   DR [facility]    LEFT HEART CATH AND CORONARY ANGIOGRAPHY N/A 12/04/2018   Procedure: LEFT HEART CATH AND CORONARY ANGIOGRAPHY;  Surgeon: Lyn Records, MD;  Location: [facility];  Service: Cardiovascular;  Laterality: N/A;   TOTAL KNEE ARTHROPLASTY Left 03/31/2018   Procedure: LEFT TOTAL KNEE ARTHROPLASTY;  Surgeon: Eugenia Mcalpine, MD;  Location: [facility];  Service: Orthopedics;  Laterality: Left;  with block   TOTAL KNEE ARTHROPLASTY Right 04/30/2020   Procedure: RIGHT TOTAL KNEE ARTHROPLASTY;  Surgeon: Ollen Gross, MD;  Location: [facility];  Service: Orthopedics;  Laterality: Right;    VAGINAL HYSTERECTOMY  age 88s    REVIEW OF SYSTEMS:  A comprehensive review of systems was negative except for: Musculoskeletal: positive for arthralgias   PHYSICAL EXAMINATION: General appearance: alert, cooperative, and no distress Head: Normocephalic, without obvious abnormality, atraumatic Neck: no adenopathy, no JVD, supple, symmetrical, trachea midline, and thyroid not enlarged, symmetric, no tenderness/mass/nodules Lymph nodes: Cervical, supraclavicular, and axillary nodes normal. Resp: clear to  auscultation bilaterally Back: symmetric, no curvature. ROM normal. No CVA tenderness. Cardio: regular rate and rhythm, S1, S2 normal, no murmur, click, rub or gallop GI: soft, non-tender; bowel sounds normal; no masses,  no organomegaly Extremities: extremities normal, atraumatic, no cyanosis or edema  ECOG PERFORMANCE STATUS: 1 - Symptomatic but completely ambulatory  Blood pressure 114/78, pulse 71, temperature (!) 97.3 F (36.3 C), temperature source Temporal, resp. rate 16, height 5\' 5"  (1.651 m), weight 172 lb 11.2 oz (78.3 kg), SpO2 100%.  LABORATORY DATA: Lab Results  Component Value Date   WBC 3.4 (L) 01/02/2024   HGB 13.5 01/02/2024   HCT 40.5 01/02/2024   MCV 92.7 01/02/2024   PLT 205 01/02/2024      Chemistry      Component Value Date/Time   NA 140 12/05/2023 1037   NA 139 10/19/2022 1030   K 3.1 (L) 12/05/2023 1037   CL 108 12/05/2023 1037   CO2 26 12/05/2023 1037   BUN 12 12/05/2023 1037   BUN 13 10/19/2022 1030   CREATININE 0.80 12/05/2023 1037      Component Value Date/Time   CALCIUM  9.4 12/05/2023 1037   ALKPHOS 60 12/05/2023 1037   AST 25 12/05/2023 1037   ALT 14 12/05/2023 1037   BILITOT 0.4 12/05/2023 1037       RADIOGRAPHIC STUDIES: No results found.  ASSESSMENT AND PLAN: This is a very pleasant 83 years old African-American female diagnosed with Stage IIIC (T3, N3, M0) non-small cell lung cancer, adenocarcinoma presented with supraclavicular lymphadenopathy, 2 left lung nodules diagnosed in September 2024.  There is also a short segment of wall thickening involving the splenic flexure of the colon with associated mild hypermetabolism which is nonspecific and colonoscopy was recommended.  Molecular studies showed positive KRAS G12C mutation and PD-L1 expression of 5%. The patient completed a course of concurrent chemoradiation with weekly carboplatin for AUC of 2 and paclitaxel 45 Mg/M2 started on September 20, 2023.  She is status post 6 week of  treatment. The patient is currently undergoing consolidation treatment with immunotherapy with Imfinzi 1500 Mg IV every 4 weeks status post 1 cycle.  The patient has been tolerating this treatment fairly well.    Stage III Non-Small Cell Lung Cancer (NSCLC) An 83 year old with Stage III NSCLC, currently on durvalumab (Imfinzi) maintenance therapy post-chemoradiation. Completed one cycle with no new symptoms. Reports dry skin, likely secondary to radiation therapy. Potassium level is low at 3.1 mmol/L. Discussed increasing dietary potassium intake and the importance of maintaining potassium levels. - Continue durvalumab maintenance therapy - Increase dietary potassium intake (bananas, orange juice) - Monitor potassium levels  Osteoarthritis Chronic pain due to osteoarthritis, managed with naproxen and diclofenac gel. Advised to limit naproxen use due to potential gastrointestinal and renal side effects. Discussed alternative pain management strategies. - Limit naproxen use - Continue diclofenac gel as needed  Follow-up - Schedule follow-up appointment in four weeks - Contact Dr. Laurey Morale office to reschedule gastroenterology appointment.   She was advised to call immediately if she has any other concerning symptoms in the interval. The patient voices understanding of current disease status and treatment options and is in agreement with the current care plan.  All questions were answered. The patient knows to call the clinic with any problems, questions or concerns. We can certainly see the patient much sooner if necessary.  The total time spent in the appointment was 20 minutes.  Disclaimer: This note was dictated with voice recognition software. Similar sounding words can inadvertently be transcribed and may not be corrected upon review.

## 2024-01-02 NOTE — Patient Instructions (Signed)
[facility]  Discharge Instructions: Thank you for choosing [facility] to provide your oncology and hematology care.   If you have a lab appointment with the Cancer Center, please go directly to the Cancer Center and check in at the registration area.   Wear comfortable clothing and clothing appropriate for easy access to any Portacath or PICC line.   We strive to give you quality time with your provider. You may need to reschedule your appointment if you arrive late (15 or more minutes).  Arriving late affects you and other patients whose appointments are after yours.  Also, if you miss three or more appointments without notifying the office, you may be dismissed from the clinic at the provider's discretion.      For prescription refill requests, have your pharmacy contact our office and allow 72 hours for refills to be completed.    Today you received the following chemotherapy and/or immunotherapy agents: Imfinzi      To help prevent nausea and vomiting after your treatment, we encourage you to take your nausea medication as directed.  BELOW ARE SYMPTOMS THAT SHOULD BE REPORTED IMMEDIATELY: *FEVER GREATER THAN 100.4 F (38 C) OR HIGHER *CHILLS OR SWEATING *NAUSEA AND VOMITING THAT IS NOT CONTROLLED WITH YOUR NAUSEA MEDICATION *UNUSUAL SHORTNESS OF BREATH *UNUSUAL BRUISING OR BLEEDING *URINARY PROBLEMS (pain or burning when urinating, or frequent urination) *BOWEL PROBLEMS (unusual diarrhea, constipation, pain near the anus) TENDERNESS IN MOUTH AND THROAT WITH OR WITHOUT PRESENCE OF ULCERS (sore throat, sores in mouth, or a toothache) UNUSUAL RASH, SWELLING OR PAIN  UNUSUAL VAGINAL DISCHARGE OR ITCHING   Items with * indicate a potential emergency and should be followed up as soon as possible or go to the Emergency Department if any problems should occur.  Please show the CHEMOTHERAPY ALERT CARD or IMMUNOTHERAPY  ALERT CARD at check-in to the Emergency Department and triage nurse.  Should you have questions after your visit or need to cancel or reschedule your appointment, please contact [facility] - A DEPT OF [facility]  Dept: [phone]  and follow the prompts.  Office hours are 8:00 a.m. to 4:30 p.m. Monday - Friday. Please note that voicemails left after 4:00 p.m. may not be returned until the following business day.  We are closed weekends and major holidays. You have access to a nurse at all times for urgent questions. Please call the main number to the clinic Dept: [phone] and follow the prompts.   For any non-urgent questions, you may also contact your provider using MyChart. We now offer e-Visits for anyone 35 and older to request care online for non-urgent symptoms. For details visit [email or web address].   Also download the MyChart app! Go to the app store, search "MyChart", open the app, select , and log in with your MyChart username and password.

## 2024-01-04 DIAGNOSIS — E876 Hypokalemia: Secondary | ICD-10-CM | POA: Diagnosis not present

## 2024-01-04 DIAGNOSIS — R7303 Prediabetes: Secondary | ICD-10-CM | POA: Diagnosis not present

## 2024-01-04 DIAGNOSIS — I471 Supraventricular tachycardia, unspecified: Secondary | ICD-10-CM | POA: Diagnosis not present

## 2024-01-04 DIAGNOSIS — K760 Fatty (change of) liver, not elsewhere classified: Secondary | ICD-10-CM | POA: Diagnosis not present

## 2024-01-04 DIAGNOSIS — C3492 Malignant neoplasm of unspecified part of left bronchus or lung: Secondary | ICD-10-CM | POA: Diagnosis not present

## 2024-01-04 DIAGNOSIS — I10 Essential (primary) hypertension: Secondary | ICD-10-CM | POA: Diagnosis not present

## 2024-01-04 DIAGNOSIS — R948 Abnormal results of function studies of other organs and systems: Secondary | ICD-10-CM | POA: Diagnosis not present

## 2024-01-04 DIAGNOSIS — M2550 Pain in unspecified joint: Secondary | ICD-10-CM | POA: Diagnosis not present

## 2024-01-04 DIAGNOSIS — E7800 Pure hypercholesterolemia, unspecified: Secondary | ICD-10-CM | POA: Diagnosis not present

## 2024-01-04 DIAGNOSIS — K2100 Gastro-esophageal reflux disease with esophagitis, without bleeding: Secondary | ICD-10-CM | POA: Diagnosis not present

## 2024-01-10 DIAGNOSIS — K648 Other hemorrhoids: Secondary | ICD-10-CM | POA: Diagnosis not present

## 2024-01-10 DIAGNOSIS — D125 Benign neoplasm of sigmoid colon: Secondary | ICD-10-CM | POA: Diagnosis not present

## 2024-01-10 DIAGNOSIS — R933 Abnormal findings on diagnostic imaging of other parts of digestive tract: Secondary | ICD-10-CM | POA: Diagnosis not present

## 2024-01-10 DIAGNOSIS — K5730 Diverticulosis of large intestine without perforation or abscess without bleeding: Secondary | ICD-10-CM | POA: Diagnosis not present

## 2024-01-10 DIAGNOSIS — K635 Polyp of colon: Secondary | ICD-10-CM | POA: Diagnosis not present

## 2024-01-12 ENCOUNTER — Ambulatory Visit: Payer: Medicare Other | Admitting: Gastroenterology

## 2024-01-12 DIAGNOSIS — D125 Benign neoplasm of sigmoid colon: Secondary | ICD-10-CM | POA: Diagnosis not present

## 2024-01-30 ENCOUNTER — Other Ambulatory Visit: Payer: Self-pay | Admitting: Physician Assistant

## 2024-01-30 ENCOUNTER — Inpatient Hospital Stay: Payer: Medicare Other

## 2024-01-30 ENCOUNTER — Encounter: Payer: Self-pay | Admitting: Internal Medicine

## 2024-01-30 ENCOUNTER — Inpatient Hospital Stay: Payer: Medicare Other | Admitting: Internal Medicine

## 2024-01-30 ENCOUNTER — Telehealth: Payer: Self-pay | Admitting: Internal Medicine

## 2024-01-30 DIAGNOSIS — C3412 Malignant neoplasm of upper lobe, left bronchus or lung: Secondary | ICD-10-CM

## 2024-01-30 NOTE — Telephone Encounter (Signed)
 Patient left a voicemail to cancel her 2/24 appointments due to having the flu. Called to verify and spoke to the patients daughter. Patient will be rescheduled when feeling better.

## 2024-01-31 ENCOUNTER — Telehealth: Payer: Self-pay | Admitting: Medical Oncology

## 2024-01-31 NOTE — Telephone Encounter (Signed)
 Flu-Temperature last night 101. She has flu -like symptoms muscle aches, cough , fever. . Dr. Orson Aloe prescribed Tamiflu.  I told her to keep her appts for  next week , but to call Thursday and let us know how she feels.

## 2024-02-02 ENCOUNTER — Telehealth: Payer: Self-pay | Admitting: Medical Oncology

## 2024-02-02 NOTE — Telephone Encounter (Signed)
 LVM to return my call with update on flu like symptoms after starting Tamiflu on 01/30/24.

## 2024-02-02 NOTE — Progress Notes (Deleted)
 Cardiology Office Note:   Date:  02/02/2024  ID:  Stephanie Newton, DOB 08/22/1941, MRN [id number] PCP:  Emilio Aspen, MD  [facility] Providers Cardiologist:  Alverda Skeans, MD Referring MD: Emilio Aspen, *  Chief Complaint/Reason for Referral: Left main aneurysm, hypertension ASSESSMENT:    1. Coronary artery aneurysm   2. CAD in native artery   3. Hyperlipidemia LDL goal <70   4. Aortic atherosclerosis (HCC)   5. Essential hypertension   6. Prediabetes   7. BMI 28.0-28.9,adult     PLAN:   In order of problems listed above: Left main aneurysm: Will obtain coronary CTA for routine imaging.  With the lack of symptoms would only consider surgical intervention for a size of 20 mm. Coronary artery disease: Continue aspirin 81 mg daily, simvastatin 20 mg daily. Hyperlipidemia: Check lipid panel, LFTs, and LP(a) today.*** Aortic atherosclerosis: Continue aspirin 81 mg daily, simvastatin 20 mg daily, and strict blood pressure control. Hypertension:*** Prediabetes: Continue aspirin 81 mg, strict blood pressure control, and simvastatin 20 mg. Elevated BMI: Diet and exercise modification.        {Are you ordering a CV Procedure (e.g. stress test, cath, DCCV, TEE, etc)?   Press F2        :[id number]}   Dispo:  No follow-ups on file.      Medication Adjustments/Labs and Tests Ordered: Current medicines are reviewed at length with the patient today.  Concerns regarding medicines are outlined above.  The following changes have been made:  {PLAN; NO CHANGE:13088:s}   Labs/tests ordered: No orders of the defined types were placed in this encounter.   Medication Changes: No orders of the defined types were placed in this encounter.   Current medicines are reviewed at length with the patient today.  The patient {ACTIONS; HAS/DOES NOT HAVE:19233} concerns regarding medicines.  I spent *** minutes reviewing all clinical data during and prior to this visit  including all relevant imaging studies, laboratories, clinical information from other health systems and prior notes from both Cardiology and other specialties, interviewing the patient, conducting a complete physical examination, and coordinating care in order to formulate a comprehensive and personalized evaluation and treatment plan.   History of Present Illness:      FOCUSED PROBLEM LIST:   Left main saccular aneurysm >> consider surgery for asymptomatic aneurysm at 20 mm 8.5 x 8.5 x 10 mm 2020 12 x 10 x 8 mm 2021 12 x 10 x 9 mm 2022 13 x 10 x 10 mm 2023 CAD Minimal, nonobstructive disease see coronary CTA 2023 Hyperlipidemia Aortic atherosclerosis Chest CT 2024 Hypertension Prediabetes Hemoglobin A1c 6.2 2023 BMI 02 March 2024:  Patient consents to use of AI scribe.***  The patient returns for routine follow-up.  The patient was last seen in March 2024.  At that point in time she was doing well without cardiovascular complaints.  Her blood pressure was under good control.           Current Medications: No outpatient medications have been marked as taking for the 02/09/24 encounter (Appointment) with Orbie Pyo, MD.     Review of Systems:   Please see the history of present illness.    All other systems reviewed and are negative.     EKGs/Labs/Other Test Reviewed:   EKG: EKG from July 2024 demonstrates sinus bradycardia with inferior infarction pattern  EKG Interpretation Date/Time:    Ventricular Rate:    PR Interval:    QRS Duration:  QT Interval:    QTC Calculation:   R Axis:      Text Interpretation:           Risk Assessment/Calculations:   {Does this patient have ATRIAL FIBRILLATION?:[phone]}      Physical Exam:   VS:  There were no vitals taken for this visit.   No BP recorded.  {Refresh Note OR Click here to enter BP  :1}***   Wt Readings from Last 3 Encounters:  01/02/24 172 lb 11.2 oz (78.3 kg)  12/05/23 173 lb 8 oz (78.7 kg)   11/28/23 175 lb 12.8 oz (79.7 kg)      GENERAL:  No apparent distress, AOx3 HEENT:  No carotid bruits, +2 carotid impulses, no scleral icterus CAR: RRR Irregular RR*** no murmurs***, gallops, rubs, or thrills RES:  Clear to auscultation bilaterally ABD:  Soft, nontender, nondistended, positive bowel sounds x 4 VASC:  +2 radial pulses, +2 carotid pulses NEURO:  CN 2-12 grossly intact; motor and sensory grossly intact PSYCH:  No active depression or anxiety EXT:  No edema, ecchymosis, or cyanosis  Signed, Orbie Pyo, MD  02/02/2024 4:14 PM    [facility] [address] [place], [place], [place]  [address] Phone: [phone]; Fax: [phone]   Note:  This document was prepared using Dragon voice recognition software and may include unintentional dictation errors.

## 2024-02-02 NOTE — Telephone Encounter (Signed)
 F/U Flu like symptoms.-She stated she feels better today except for fatigue and mild cough. She said she cannot take a shower. She only took 2 doses of tamiflu. I told her that may be a reason why she is still fatigued.

## 2024-02-05 ENCOUNTER — Emergency Department (HOSPITAL_BASED_OUTPATIENT_CLINIC_OR_DEPARTMENT_OTHER)

## 2024-02-05 ENCOUNTER — Emergency Department (HOSPITAL_BASED_OUTPATIENT_CLINIC_OR_DEPARTMENT_OTHER)
Admission: EM | Admit: 2024-02-05 | Discharge: 2024-02-05 | Disposition: A | Discharge status: Home / Self Care | Attending: Emergency Medicine | Admitting: Emergency Medicine

## 2024-02-05 DIAGNOSIS — J90 Pleural effusion, not elsewhere classified: Secondary | ICD-10-CM | POA: Insufficient documentation

## 2024-02-05 DIAGNOSIS — Z79899 Other long term (current) drug therapy: Secondary | ICD-10-CM | POA: Diagnosis not present

## 2024-02-05 DIAGNOSIS — J189 Pneumonia, unspecified organism: Secondary | ICD-10-CM | POA: Insufficient documentation

## 2024-02-05 DIAGNOSIS — C3490 Malignant neoplasm of unspecified part of unspecified bronchus or lung: Secondary | ICD-10-CM | POA: Diagnosis not present

## 2024-02-05 DIAGNOSIS — Z7982 Long term (current) use of aspirin: Secondary | ICD-10-CM | POA: Insufficient documentation

## 2024-02-05 DIAGNOSIS — R42 Dizziness and giddiness: Secondary | ICD-10-CM | POA: Diagnosis not present

## 2024-02-05 DIAGNOSIS — E869 Volume depletion, unspecified: Secondary | ICD-10-CM | POA: Diagnosis not present

## 2024-02-05 DIAGNOSIS — I2541 Coronary artery aneurysm: Secondary | ICD-10-CM | POA: Diagnosis not present

## 2024-02-05 DIAGNOSIS — Z85118 Personal history of other malignant neoplasm of bronchus and lung: Secondary | ICD-10-CM | POA: Insufficient documentation

## 2024-02-05 DIAGNOSIS — R531 Weakness: Secondary | ICD-10-CM | POA: Diagnosis not present

## 2024-02-05 DIAGNOSIS — C3411 Malignant neoplasm of upper lobe, right bronchus or lung: Secondary | ICD-10-CM | POA: Diagnosis not present

## 2024-02-05 LAB — COMPREHENSIVE METABOLIC PANEL
ALT: 17 U/L (ref 0–44)
AST: 31 U/L (ref 15–41)
Albumin: 4 g/dL (ref 3.5–5.0)
Alkaline Phosphatase: 65 U/L (ref 38–126)
Anion gap: 11 (ref 5–15)
BUN: 8 mg/dL (ref 8–23)
CO2: 24 mmol/L (ref 22–32)
Calcium: 9.2 mg/dL (ref 8.9–10.3)
Chloride: 102 mmol/L (ref 98–111)
Creatinine, Ser: 0.91 mg/dL (ref 0.44–1.00)
GFR, Estimated: >60 mL/min (ref >60)
Glucose, Bld: 123 mg/dL — ABNORMAL HIGH (ref 70–99)
Potassium: 3.5 mmol/L (ref 3.5–5.1)
Sodium: 137 mmol/L (ref 135–145)
Total Bilirubin: 0.4 mg/dL (ref 0.0–1.2)
Total Protein: 6.7 g/dL (ref 6.5–8.1)

## 2024-02-05 LAB — CBC WITH DIFFERENTIAL/PLATELET
Abs Immature Granulocytes: 0.02 10*3/uL (ref 0.00–0.07)
Basophils Absolute: 0 10*3/uL (ref 0.0–0.1)
Basophils Relative: 0 %
Eosinophils Absolute: 0 10*3/uL (ref 0.0–0.5)
Eosinophils Relative: 0 %
HCT: 40 % (ref 36.0–46.0)
Hemoglobin: 13.3 g/dL (ref 12.0–15.0)
Immature Granulocytes: 0 %
Lymphocytes Relative: 8 %
Lymphs Abs: 0.4 10*3/uL — ABNORMAL LOW (ref 0.7–4.0)
MCH: 30.4 pg (ref 26.0–34.0)
MCHC: 33.3 g/dL (ref 30.0–36.0)
MCV: 91.5 fL (ref 80.0–100.0)
Monocytes Absolute: 0.4 10*3/uL (ref 0.1–1.0)
Monocytes Relative: 8 %
Neutro Abs: 4 10*3/uL (ref 1.7–7.7)
Neutrophils Relative %: 84 %
Platelets: 183 10*3/uL (ref 150–400)
RBC: 4.37 MIL/uL (ref 3.87–5.11)
RDW: 12.3 % (ref 11.5–15.5)
WBC: 4.8 10*3/uL (ref 4.0–10.5)
nRBC: 0 % (ref 0.0–0.2)

## 2024-02-05 LAB — URINALYSIS, ROUTINE W REFLEX MICROSCOPIC
Bilirubin Urine: NEGATIVE
Glucose, UA: NEGATIVE mg/dL
Ketones, ur: NEGATIVE mg/dL
Nitrite: NEGATIVE
Protein, ur: 30 mg/dL — AB
Specific Gravity, Urine: 1.006 (ref 1.005–1.030)
WBC, UA: >50 WBC/hpf (ref 0–5)
pH: 6.5 (ref 5.0–8.0)

## 2024-02-05 LAB — RESP PANEL BY RT-PCR (RSV, FLU A&B, COVID)  RVPGX2
Influenza A by PCR: NEGATIVE
Influenza B by PCR: NEGATIVE
Resp Syncytial Virus by PCR: NEGATIVE
SARS Coronavirus 2 by RT PCR: NEGATIVE

## 2024-02-05 LAB — TROPONIN I (HIGH SENSITIVITY): Troponin I (High Sensitivity): 8 ng/L (ref <18)

## 2024-02-05 LAB — LIPASE, BLOOD: Lipase: 11 U/L (ref 11–51)

## 2024-02-05 MED ORDER — AMOXICILLIN-POT CLAVULANATE 875-125 MG PO TABS
1.0000 | ORAL_TABLET | Freq: Two times a day (BID) | ORAL | 0 refills | Status: DC
Start: 2024-02-05 — End: 2024-09-25

## 2024-02-05 MED ORDER — AMOXICILLIN-POT CLAVULANATE 875-125 MG PO TABS
1.0000 | ORAL_TABLET | Freq: Once | ORAL | Status: AC
Start: 2024-02-05 — End: 2024-02-05
  Administered 2024-02-05: 1 via ORAL
  Filled 2024-02-05: qty 1

## 2024-02-05 MED ORDER — DOXYCYCLINE HYCLATE 100 MG PO CAPS: 100.0000 mg | ORAL_CAPSULE | Freq: Two times a day (BID) | ORAL | 0 refills | Status: DC

## 2024-02-05 MED ORDER — SODIUM CHLORIDE 0.9 % IV BOLUS
1000.0000 mL | Freq: Once | INTRAVENOUS | Status: AC
  Administered 2024-02-05: 1000 mL via INTRAVENOUS

## 2024-02-05 MED ORDER — DOXYCYCLINE HYCLATE 100 MG PO TABS
100.0000 mg | ORAL_TABLET | Freq: Once | ORAL | Status: AC
Start: 2024-02-05 — End: 2024-02-05
  Administered 2024-02-05: 100 mg via ORAL
  Filled 2024-02-05: qty 1

## 2024-02-05 MED ORDER — DEXAMETHASONE 4 MG PO TABS
10.0000 mg | ORAL_TABLET | Freq: Once | ORAL | Status: AC
Start: 2024-02-05 — End: 2024-02-05
  Administered 2024-02-05: 10 mg via ORAL
  Filled 2024-02-05: qty 3

## 2024-02-05 MED ORDER — IOHEXOL 350 MG/ML SOLN
75.0000 mL | Freq: Once | INTRAVENOUS | Status: AC | PRN
  Administered 2024-02-05: 100 mL via INTRAVENOUS

## 2024-02-05 NOTE — ED Notes (Signed)
 Patient ambulatory to restroom  ?

## 2024-02-05 NOTE — ED Provider Notes (Addendum)
 [place] EMERGENCY DEPARTMENT AT [facility] Provider Note   CSN: [id number] Arrival date & time: 02/05/24  1228     History  Chief Complaint  Patient presents with   Dizziness    Stephanie Newton is a 83 y.o. female.  Patient here with some lightheadedness generalized weakness flulike symptoms for the last week.  Lightheaded when she was standing today.  Felt like she has not really recovered.  She had a fever all week but mostly resolved now.  She has a history of adenocarcinoma now undergoing maintenance immunologic therapy.  She denies any chest pain but she has felt short of breath.  Denies any nausea vomiting diarrhea.  Has not had much of an appetite.  She felt like she just has not gotten better and symptoms been ongoing for about 6 or 7 days now.  No change in sputum production or cough.  The history is provided by the patient.       Home Medications Prior to Admission medications   Medication Sig Start Date End Date Taking? Authorizing Provider  amoxicillin-clavulanate (AUGMENTIN) 875-125 MG tablet Take 1 tablet by mouth every 12 (twelve) hours. 02/05/24  Yes Samarra Ridgely, DO  doxycycline (VIBRAMYCIN) 100 MG capsule Take 1 capsule (100 mg total) by mouth 2 (two) times daily. 02/05/24  Yes Kiam Bransfield, DO  amLODipine (NORVASC) 2.5 MG tablet Take 2.5 mg by mouth daily. 09/03/20   [provider]  aspirin EC 81 MG tablet Take 1 tablet by mouth daily.    [provider]  azithromycin (ZITHROMAX) 250 MG tablet Take 2 tablets today, then one tablet each day for the next 6 days. 09/02/23   Bevelyn Ngo, NP  estradiol (ESTRACE) 1 MG tablet Take 1 mg by mouth daily.    [provider]  famotidine (PEPCID) 20 MG tablet Take 20 mg by mouth as needed. 01/26/22   [provider]  hydrochlorothiazide (HYDRODIURIL) 12.5 MG tablet Take 12.5 mg by mouth daily.     [provider]  methocarbamol (ROBAXIN) 500 MG tablet Take 1 tablet  (500 mg total) by mouth every 6 (six) hours as needed for muscle spasms. 05/01/20   Edmisten, Kristie L, PA  metoprolol succinate (TOPROL-XL) 100 MG 24 hr tablet Take 100 mg by mouth daily.    [provider]  metoprolol succinate (TOPROL-XL) 50 MG 24 hr tablet Take 1 tablet (50 mg total) by mouth daily. 08/09/23   Sharlene Dory, PA-C  oseltamivir (TAMIFLU) 75 MG capsule Take 75 mg by mouth 2 (two) times daily. 01/30/24   [provider]  oxyCODONE-acetaminophen (PERCOCET/ROXICET) 5-325 MG tablet Take 1-2 tablets by mouth every 8 (eight) hours as needed for severe pain. 06/25/23   Benjiman Core, MD  potassium chloride SA (KLOR-CON M) 20 MEQ tablet Take 1 tablet (20 mEq total) by mouth daily. 10/24/23   Heilingoetter, Cassandra L, PA-C  prochlorperazine (COMPAZINE) 10 MG tablet Take 1 tablet (10 mg total) by mouth every 6 (six) hours as needed. 09/06/23   Heilingoetter, Cassandra L, PA-C  simvastatin (ZOCOR) 20 MG tablet Take 20 mg by mouth at bedtime.    [provider]  sucralfate (CARAFATE) 1 g tablet Take 1 tablet (1 g total) by mouth 4 (four) times daily -  with meals and at bedtime. 10/10/23   Si Gaul, MD      Allergies    Shellfish allergy    Review of Systems   Review of Systems  Physical Exam  Updated Vital Signs BP (!) 152/82 (BP Location: Right Arm)   Pulse 88   Temp 97.8 F (36.6 C)   Resp 18   Ht 5\' 5"  (1.651 m)   Wt 77.1 kg   SpO2 97%   BMI 28.29 kg/m  Physical Exam Vitals and nursing note reviewed.  Constitutional:      General: She is not in acute distress.    Appearance: She is well-developed.  HENT:     Head: Normocephalic and atraumatic.     Nose: Nose normal.     Mouth/Throat:     Mouth: Mucous membranes are moist.  Eyes:     Extraocular Movements: Extraocular movements intact.     Conjunctiva/sclera: Conjunctivae normal.     Pupils: Pupils are equal, round, and reactive to light.  Cardiovascular:     Rate and Rhythm:  Normal rate and regular rhythm.     Pulses: Normal pulses.     Heart sounds: Normal heart sounds. No murmur heard. Pulmonary:     Effort: Pulmonary effort is normal. No respiratory distress.     Breath sounds: Normal breath sounds.  Abdominal:     General: Abdomen is flat.     Palpations: Abdomen is soft.     Tenderness: There is no abdominal tenderness.  Musculoskeletal:        General: No swelling. Normal range of motion.     Cervical back: Normal range of motion and neck supple.  Skin:    General: Skin is warm and dry.     Capillary Refill: Capillary refill takes less than 2 seconds.  Neurological:     General: No focal deficit present.     Mental Status: She is alert.  Psychiatric:        Mood and Affect: Mood normal.     ED Results / Procedures / Treatments   Labs (all labs ordered are listed, but only abnormal results are displayed) Labs Reviewed  CBC WITH DIFFERENTIAL/PLATELET - Abnormal; Notable for the following components:      Result Value   Lymphs Abs 0.4 (*)    All other components within normal limits  COMPREHENSIVE METABOLIC PANEL - Abnormal; Notable for the following components:   Glucose, Bld 123 (*)    All other components within normal limits  URINALYSIS, ROUTINE W REFLEX MICROSCOPIC - Abnormal; Notable for the following components:   APPearance HAZY (*)    Hgb urine dipstick LARGE (*)    Protein, ur 30 (*)    Leukocytes,Ua LARGE (*)    Bacteria, UA FEW (*)    All other components within normal limits  RESP PANEL BY RT-PCR (RSV, FLU A&B, COVID)  [id number]  URINE CULTURE  LIPASE, BLOOD  TROPONIN I (HIGH SENSITIVITY)    EKG None  Radiology CT Angio Chest PE W and/or Wo Contrast Result Date: 02/05/2024 CLINICAL DATA:  Lightheadedness. Dizziness this morning. On chemotherapy for metastatic lung cancer. Prior radiation therapy. * Tracking Code: BO * EXAM: CT ANGIOGRAPHY CHEST WITH CONTRAST TECHNIQUE: Multidetector CT imaging of the chest was performed  using the standard protocol during bolus administration of intravenous contrast. Multiplanar CT image reconstructions and MIPs were obtained to evaluate the vascular anatomy. RADIATION DOSE REDUCTION: This exam was performed according to the departmental dose-optimization program which includes automated exposure control, adjustment of the mA and/or kV according to patient size and/or use of iterative reconstruction technique. CONTRAST:  OMNIPAQUE IOHEXOL 350 MG/ML SOLN COMPARISON:  Chest radiograph of earlier today. Chest CT of  11/17/2023. FINDINGS: Cardiovascular: The quality of this exam for evaluation of pulmonary embolism is moderate. No evidence of pulmonary embolism. Aortic atherosclerosis. Tortuous thoracic aorta. Mild cardiomegaly, without pericardial effusion. An aneurysm of the proximal LAD or distal left main coronary artery measures 1.5 cm on 69/4 and is relatively similar to on the prior. This was characterized back on 01/05/2019 dedicated CTA where it measured 10 mm. Felt to arise from the trifurcation of the left main on that exam. Mediastinum/Nodes: Left supraclavicular low-density structure measures 2.6 x 2.9 cm on 16/4 versus 3.3 x 2.2 cm on the prior. No mediastinal or hilar adenopathy. Lungs/Pleura: New small left pleural effusion. Left upper lobe bandlike opacities and volume loss including on 58/6, are new but favored to represent atelectasis or developing radiation fibrosis. Poor inspiratory effort with volume loss in both lower lobes. In the left lower lobe, there is new peribronchovascular airspace and ground-glass consolidation including on 79/6. A mixed attenuation right upper lobe pulmonary nodule is similar at 1.0 cm on 37/6. A nodule or area of nodular consolidation in the superior most aspect of the left lower lobe measures 1.8 x 1.0 cm on 37/6 versus 1.5 x 0.9 cm on the prior. Just caudal to this, a superior segment left lower lobe area of nodularity measures 1.2 cm on 49/6  versus similar on the prior. Inferior anterior right upper lobe 5 mm nodule on 67/6 is new. Upper Abdomen: Increasingly heterogeneous appearance of the liver. An anterior left hepatic lobe hypoattenuating lesion of 1.7 cm on 126/4 is slightly less well-defined today. This was non FDG avid and felt to represent a cyst on 07/06/2023 PET. Normal imaged portions of the spleen, stomach, pancreas, right adrenal gland, left kidney. Upper pole right renal 3.7 cm fluid density lesion is likely a cyst . In the absence of clinically indicated signs/symptoms require(s) no independent follow-up. Left adrenal nodularity is incompletely imaged but grossly similar to on the prior. Musculoskeletal: Probable nodular parenchyma within the lateral right breast on 87/4, similar. Right subscapularis intramuscular lipoma of 4.8 cm. No acute osseous abnormality. Review of the MIP images confirms the above findings. IMPRESSION: 1. Moderate quality evaluation for pulmonary embolism. None identified. 2. Poor inspiratory effort in both lung bases. Peribronchovascular airspace and ground-glass opacity in the left lower lobe is new. Favor interval pneumonia. If the patient has had radiation therapy to this area, evolving radiation change could look similar but is felt less likely. 3. New small left pleural effusion. 4. The underlying presumed infectious process makes evaluation of pulmonary nodules challenging. Felt to be similar to minimally progressive as detailed above. A new right upper lobe 5 mm pulmonary nodule may represent progressive metastasis. 5. Decreased size of left supraclavicular low-density lesion which is likely a necrotic nodal metastasis. 6. Increasingly heterogeneous appearance of the liver which has been attributed to heterogeneous steatosis on prior exams. If there is a clinical concern of hepatic metastasis, consider further evaluation with nonemergent pre and post contrast abdominal MRI or PET. 7. Slow enlargement of a  coronary artery aneurysm (likely off the distal left main) since it was characterized back on 01/05/2019 dedicated CTA. Electronically Signed   By: Jeronimo Greaves M.D.   On: 02/05/2024 15:11   DG Chest Portable 1 View Result Date: 02/05/2024 CLINICAL DATA:  Weakness. Dizziness. Lightheaded. Non-small cell lung cancer. EXAM: PORTABLE CHEST - 1 VIEW COMPARISON:  08/30/2023 FINDINGS: Cardiomediastinal silhouette and pulmonary vasculature are within normal limits. Interval improvement in the aeration of the right lung  base. There is new airspace opacity in the left mid lung which may be due to atelectasis or pneumonia. The known lung malignancy is better visualized on prior CT, however likely corresponds to the 1.8 cm spiculated opacity in the left suprahilar region. IMPRESSION: New opacity in the left mid lung possibly due to atelectasis or pneumonia. Electronically Signed   By: Acquanetta Belling M.D.   On: 02/05/2024 13:12    Procedures Procedures    Medications Ordered in ED Medications  dexamethasone (DECADRON) tablet 10 mg (has no administration in time range)  doxycycline (VIBRA-TABS) tablet 100 mg (has no administration in time range)  amoxicillin-clavulanate (AUGMENTIN) 875-125 MG per tablet 1 tablet (has no administration in time range)  sodium chloride 0.9 % bolus 1,000 mL ( Intravenous Stopped 02/05/24 1445)  iohexol (OMNIPAQUE) 350 MG/ML injection 75 mL (100 mLs Intravenous Contrast Given 02/05/24 1425)    ED Course/ Medical Decision Making/ A&P                                 Medical Decision Making Amount and/or Complexity of Data Reviewed Labs: ordered. Radiology: ordered.  Risk Prescription drug management.   Stephanie Newton is here with generalized weakness lightheadedness flulike symptoms now for the past week.  She had fever earlier now resolved her energy quite back.  She has history of adenocarcinoma of the lung on maintenance immunotherapy.  She has history of hypertension  high cholesterol otherwise.  She not having any chest pain but she does feel short of breath.  She had a cough but no major sputum production.  Somewhat always has a cough.  She is well-appearing otherwise.  Does not really look dehydrated.  May be some GI symptoms.  Differential diagnosis likely viral/infectious process, dehydration, may be postviral flu electrolyte abnormality.  Seems less likely to be ACS or PE but will get troponin CBC CMP urinalysis chest x-ray and likely will consider CT scan of her chest to rule out PE.  Per my review and interpretation of labs EKG shows sinus rhythm.  No ischemic changes.  Troponin normal.  Doubt ACS.  Not having chest pain.  She has no significant anemia electrolyte abnormality or kidney injury.  No leukocytosis no fever.  Have no concern for sepsis.  Chest x-ray per radiology report may be with pneumonia versus atelectasis.  Previous lung nodule seen likely related to her adenocarcinoma.  Ultimately we will get a CT scan of her chest to further evaluate for infection versus PE versus atelectasis.  She is feeling better with IV fluids.  Will also get urinalysis as she is having some urinary frequency.  Urinalysis appears negative for infection.  Will send urine culture.  CT scan concerning for likely infectious process will treat with antibiotics will give a dose of Decadron.  No obvious PE.  Small pleural effusion.  Some oncology changes that are ongoing.  She is already following up with cardiology about coronary artery aneurysm which is mentioned in the report as well.  Is not having any chest pain so much better.  I think that this likely infectious in nature.  Will have that followed up with her primary care doctor.  Discharged in good condition.  This chart was dictated using voice recognition software.  Despite best efforts to proofread,  errors can occur which can change the documentation meaning.         Final Clinical Impression(s) / ED  Diagnoses Final diagnoses:  Community acquired pneumonia, unspecified laterality    Rx / DC Orders ED Discharge Orders          Ordered    doxycycline (VIBRAMYCIN) 100 MG capsule  2 times daily        02/05/24 1515    amoxicillin-clavulanate (AUGMENTIN) 875-125 MG tablet  Every 12 hours        02/05/24 1515              Koran Seabrook, DO 02/05/24 1516    Virgina Norfolk, DO 02/05/24 1521

## 2024-02-05 NOTE — ED Triage Notes (Signed)
 States woke up this morning w/ lightheaded and dizziness. Recently had flu like symptoms. Active chemo patient for metastatic lung cancer.   Also endorses increased urinary frequency.

## 2024-02-05 NOTE — Discharge Instructions (Signed)
 As we does have your oncologist review your CT scan as well.  I am treating you for some inflammation/infection in your lungs.  Follow-up with your primary care doctor.  Return if symptoms worsen.  Take next dose antibiotic tomorrow morning.  I have treated you with a long-acting steroid as well.

## 2024-02-06 ENCOUNTER — Inpatient Hospital Stay: Payer: Medicare Other | Attending: Physician Assistant | Admitting: Internal Medicine

## 2024-02-06 ENCOUNTER — Inpatient Hospital Stay: Payer: Medicare Other

## 2024-02-06 VITALS — BP 142/81 | HR 91 | Temp 98.5°F | Resp 18 | Wt 168.6 lb

## 2024-02-06 DIAGNOSIS — Z79620 Long term (current) use of immunosuppressive biologic: Secondary | ICD-10-CM | POA: Diagnosis not present

## 2024-02-06 DIAGNOSIS — J90 Pleural effusion, not elsewhere classified: Secondary | ICD-10-CM | POA: Insufficient documentation

## 2024-02-06 DIAGNOSIS — Z79899 Other long term (current) drug therapy: Secondary | ICD-10-CM | POA: Diagnosis not present

## 2024-02-06 DIAGNOSIS — C3412 Malignant neoplasm of upper lobe, left bronchus or lung: Secondary | ICD-10-CM | POA: Insufficient documentation

## 2024-02-06 DIAGNOSIS — K760 Fatty (change of) liver, not elsewhere classified: Secondary | ICD-10-CM | POA: Diagnosis not present

## 2024-02-06 DIAGNOSIS — Z9049 Acquired absence of other specified parts of digestive tract: Secondary | ICD-10-CM | POA: Insufficient documentation

## 2024-02-06 DIAGNOSIS — I252 Old myocardial infarction: Secondary | ICD-10-CM | POA: Diagnosis not present

## 2024-02-06 DIAGNOSIS — R42 Dizziness and giddiness: Secondary | ICD-10-CM | POA: Insufficient documentation

## 2024-02-06 DIAGNOSIS — Z923 Personal history of irradiation: Secondary | ICD-10-CM | POA: Insufficient documentation

## 2024-02-06 DIAGNOSIS — R059 Cough, unspecified: Secondary | ICD-10-CM | POA: Diagnosis not present

## 2024-02-06 DIAGNOSIS — I2541 Coronary artery aneurysm: Secondary | ICD-10-CM | POA: Diagnosis not present

## 2024-02-06 DIAGNOSIS — Z90710 Acquired absence of both cervix and uterus: Secondary | ICD-10-CM | POA: Insufficient documentation

## 2024-02-06 DIAGNOSIS — R0602 Shortness of breath: Secondary | ICD-10-CM | POA: Diagnosis not present

## 2024-02-06 DIAGNOSIS — Z5112 Encounter for antineoplastic immunotherapy: Secondary | ICD-10-CM | POA: Diagnosis not present

## 2024-02-06 DIAGNOSIS — I700 Atherosclerosis of aorta: Secondary | ICD-10-CM | POA: Insufficient documentation

## 2024-02-06 DIAGNOSIS — R531 Weakness: Secondary | ICD-10-CM | POA: Insufficient documentation

## 2024-02-06 LAB — CMP (CANCER CENTER ONLY)
ALT: 16 U/L (ref 0–44)
AST: 26 U/L (ref 15–41)
Albumin: 3.9 g/dL (ref 3.5–5.0)
Alkaline Phosphatase: 75 U/L (ref 38–126)
Anion gap: 10 (ref 5–15)
BUN: 12 mg/dL (ref 8–23)
CO2: 20 mmol/L — ABNORMAL LOW (ref 22–32)
Calcium: 9.6 mg/dL (ref 8.9–10.3)
Chloride: 107 mmol/L (ref 98–111)
Creatinine: 0.84 mg/dL (ref 0.44–1.00)
GFR, Estimated: >60 mL/min (ref >60)
Glucose, Bld: 149 mg/dL — ABNORMAL HIGH (ref 70–99)
Potassium: 3.7 mmol/L (ref 3.5–5.1)
Sodium: 137 mmol/L (ref 135–145)
Total Bilirubin: 0.3 mg/dL (ref 0.0–1.2)
Total Protein: 7.1 g/dL (ref 6.5–8.1)

## 2024-02-06 LAB — CBC WITH DIFFERENTIAL (CANCER CENTER ONLY)
Abs Immature Granulocytes: 0.05 10*3/uL (ref 0.00–0.07)
Basophils Absolute: 0 10*3/uL (ref 0.0–0.1)
Basophils Relative: 0 %
Eosinophils Absolute: 0.1 10*3/uL (ref 0.0–0.5)
Eosinophils Relative: 2 %
HCT: 39.8 % (ref 36.0–46.0)
Hemoglobin: 13.5 g/dL (ref 12.0–15.0)
Immature Granulocytes: 1 %
Lymphocytes Relative: 6 %
Lymphs Abs: 0.5 10*3/uL — ABNORMAL LOW (ref 0.7–4.0)
MCH: 30.8 pg (ref 26.0–34.0)
MCHC: 33.9 g/dL (ref 30.0–36.0)
MCV: 90.7 fL (ref 80.0–100.0)
Monocytes Absolute: 0.5 10*3/uL (ref 0.1–1.0)
Monocytes Relative: 6 %
Neutro Abs: 7.4 10*3/uL (ref 1.7–7.7)
Neutrophils Relative %: 85 %
Platelet Count: 225 10*3/uL (ref 150–400)
RBC: 4.39 MIL/uL (ref 3.87–5.11)
RDW: 12.2 % (ref 11.5–15.5)
WBC Count: 8.6 10*3/uL (ref 4.0–10.5)
nRBC: 0 % (ref 0.0–0.2)

## 2024-02-06 LAB — TSH: TSH: 0.263 u[IU]/mL — ABNORMAL LOW (ref 0.350–4.500)

## 2024-02-06 MED ORDER — METHYLPREDNISOLONE 4 MG PO TBPK: ORAL_TABLET | ORAL | 0 refills | Status: DC

## 2024-02-06 NOTE — Progress Notes (Signed)
 [facility] [phone]   [phone]  OFFICE PROGRESS NOTE  Emilio Aspen, MD [address] [place] [address]  DIAGNOSIS: Stage IIIC (T3, N3, M0) non-small cell lung cancer, adenocarcinoma presented with supraclavicular lymphadenopathy, 2 left lung nodules diagnosed in September 2024.  There is also a short segment of wall thickening involving the splenic flexure of the colon with associated mild hypermetabolism which is nonspecific and colonoscopy was recommended.   Biomarker Findings HRD signature - HRDsig Negative Microsatellite status - MS-Equivocal ? Tumor Mutational Burden - 2 Muts/Mb Genomic Findings For a complete list of the genes assayed, please refer to the Appendix. KRAS G12C TP53 R110L 7 Disease relevant genes with no reportable alterations: ALK, BRAF, EGFR, ERBB2, MET, RET, ROS1  PDL1 Expression 5%.  PRIOR THERAPY: Concurrent chemoradiation with carboplatin for an AUC of 2 and taxol 45 mg/m2.  First dose September 20, 2023.  Status post 6 cycles.   CURRENT THERAPY: Consolidation treatment with Imfinzi 1500 Mg IV every 4 weeks status post 2 cycles.   INTERVAL HISTORY: Stephanie Newton 83 y.o. female returns to the clinic today for follow-up visit. Discussed the use of AI scribe software for clinical note transcription with the patient, who gave verbal consent to proceed.  History of Present Illness   Stephanie Newton is a 83 year old female with adenocarcinoma who presents with respiratory symptoms and treatment delay.  She was diagnosed with adenocarcinoma in September 2024, characterized by a KRAS G12C mutation and PD-L1 expression of 5%. She has been receiving immunotherapy with Imfinzi every four weeks and completed two cycles. The third cycle was delayed by a week to ten days due to contracting the flu from her son.  Following the flu infection, she attempted to take Tamiflu, which resulted in  severe diarrhea. She experienced nausea and diarrhea, which she attributed to the flu treatment, but noted improvement in nausea over time. She started feeling slightly better after five to six days but did not fully recover as expected.  Concerned about the prolonged symptoms, she visited the emergency room where imaging studies, including an x-ray and CT scan, revealed inflammation in the left lung. She was prescribed azithromycin (Z-Pak) with a dosing regimen of two capsules on the first day followed by one capsule daily for five days, and a single dose of steroids at the hospital. She no longer has a fever or chills, although she initially had a fever of 101F.       MEDICAL HISTORY: Past Medical History:  Diagnosis Date   Arthritis    Dysrhythmia    Hypercholesteremia    Hypertension    Myocardial infarction [facility])     ALLERGIES:  is allergic to shellfish allergy.  MEDICATIONS:  Current Outpatient Medications  Medication Sig Dispense Refill   amLODipine (NORVASC) 2.5 MG tablet Take 2.5 mg by mouth daily.     amoxicillin-clavulanate (AUGMENTIN) 875-125 MG tablet Take 1 tablet by mouth every 12 (twelve) hours. 14 tablet 0   aspirin EC 81 MG tablet Take 1 tablet by mouth daily.     azithromycin (ZITHROMAX) 250 MG tablet Take 2 tablets today, then one tablet each day for the next 6 days. 6 tablet 0   doxycycline (VIBRAMYCIN) 100 MG capsule Take 1 capsule (100 mg total) by mouth 2 (two) times daily. 20 capsule 0   estradiol (ESTRACE) 1 MG tablet Take 1 mg by mouth daily.  famotidine (PEPCID) 20 MG tablet Take 20 mg by mouth as needed.     hydrochlorothiazide (HYDRODIURIL) 12.5 MG tablet Take 12.5 mg by mouth daily.      methocarbamol (ROBAXIN) 500 MG tablet Take 1 tablet (500 mg total) by mouth every 6 (six) hours as needed for muscle spasms. 40 tablet 0   metoprolol succinate (TOPROL-XL) 100 MG 24 hr tablet Take 100 mg by mouth daily.     metoprolol succinate (TOPROL-XL) 50 MG 24 hr  tablet Take 1 tablet (50 mg total) by mouth daily. 90 tablet 1   oseltamivir (TAMIFLU) 75 MG capsule Take 75 mg by mouth 2 (two) times daily.     oxyCODONE-acetaminophen (PERCOCET/ROXICET) 5-325 MG tablet Take 1-2 tablets by mouth every 8 (eight) hours as needed for severe pain. 6 tablet 0   potassium chloride SA (KLOR-CON M) 20 MEQ tablet Take 1 tablet (20 mEq total) by mouth daily. 7 tablet 0   prochlorperazine (COMPAZINE) 10 MG tablet Take 1 tablet (10 mg total) by mouth every 6 (six) hours as needed. 30 tablet 2   simvastatin (ZOCOR) 20 MG tablet Take 20 mg by mouth at bedtime.     sucralfate (CARAFATE) 1 g tablet Take 1 tablet (1 g total) by mouth 4 (four) times daily -  with meals and at bedtime. 120 tablet 0   No current facility-administered medications for this visit.    SURGICAL HISTORY:  Past Surgical History:  Procedure Laterality Date   BREAST EXCISIONAL BIOPSY Right    x2   BRONCHIAL BIOPSY  08/30/2023   Procedure: BRONCHIAL BIOPSIES;  Surgeon: Josephine Igo, DO;  Location: [facility];  Service: Pulmonary;;   EYE SURGERY     cataract bilateral    HERNIA REPAIR  06/2000   INCISIONAL VENTRAL ; DR Carman Ching    KNEE ARTHROSCOPY Left 08/23/2017   dr Thomasena Edis    LAPAROSCOPIC CHOLECYSTECTOMY  06/13/2000   DR DOUG [facility]    LEFT HEART CATH AND CORONARY ANGIOGRAPHY N/A 12/04/2018   Procedure: LEFT HEART CATH AND CORONARY ANGIOGRAPHY;  Surgeon: Lyn Records, MD;  Location: [facility] INVASIVE CV LAB;  Service: Cardiovascular;  Laterality: N/A;   TOTAL KNEE ARTHROPLASTY Left 03/31/2018   Procedure: LEFT TOTAL KNEE ARTHROPLASTY;  Surgeon: Eugenia Mcalpine, MD;  Location: [facility] ORS;  Service: Orthopedics;  Laterality: Left;  with block   TOTAL KNEE ARTHROPLASTY Right 04/30/2020   Procedure: RIGHT TOTAL KNEE ARTHROPLASTY;  Surgeon: Ollen Gross, MD;  Location: [facility];  Service: Orthopedics;  Laterality: Right;    VAGINAL HYSTERECTOMY  age 48s    REVIEW OF SYSTEMS:   Constitutional: positive for fatigue Eyes: negative Ears, nose, mouth, throat, and face: positive for nasal congestion Respiratory: negative Cardiovascular: negative Gastrointestinal: negative Genitourinary:negative Integument/breast: negative Hematologic/lymphatic: negative Musculoskeletal:negative Neurological: negative Behavioral/Psych: negative Endocrine: negative Allergic/Immunologic: negative   PHYSICAL EXAMINATION: General appearance: alert, cooperative, and no distress Head: Normocephalic, without obvious abnormality, atraumatic Neck: no adenopathy, no JVD, supple, symmetrical, trachea midline, and thyroid not enlarged, symmetric, no tenderness/mass/nodules Lymph nodes: Cervical, supraclavicular, and axillary nodes normal. Resp: clear to auscultation bilaterally Back: symmetric, no curvature. ROM normal. No CVA tenderness. Cardio: regular rate and rhythm, S1, S2 normal, no murmur, click, rub or gallop GI: soft, non-tender; bowel sounds normal; no masses,  no organomegaly Extremities: extremities normal, atraumatic, no cyanosis or edema Neurologic: Alert and oriented X 3, normal strength and tone. Normal symmetric reflexes. Normal coordination and gait  ECOG PERFORMANCE STATUS: 1 - Symptomatic but  completely ambulatory  Blood pressure (!) 142/81, pulse 91, temperature 98.5 F (36.9 C), temperature source Temporal, resp. rate 18, weight 168 lb 9.6 oz (76.5 kg), SpO2 97%.  LABORATORY DATA: Lab Results  Component Value Date   WBC 8.6 02/06/2024   HGB 13.5 02/06/2024   HCT 39.8 02/06/2024   MCV 90.7 02/06/2024   PLT 225 02/06/2024      Chemistry      Component Value Date/Time   NA 137 02/05/2024 1317   NA 139 10/19/2022 1030   K 3.5 02/05/2024 1317   CL 102 02/05/2024 1317   CO2 24 02/05/2024 1317   BUN 8 02/05/2024 1317   BUN 13 10/19/2022 1030   CREATININE 0.91 02/05/2024 1317   CREATININE 0.82 01/02/2024 1016      Component Value Date/Time   CALCIUM 9.2  02/05/2024 1317   ALKPHOS 65 02/05/2024 1317   AST 31 02/05/2024 1317   AST 25 01/02/2024 1016   ALT 17 02/05/2024 1317   ALT 14 01/02/2024 1016   BILITOT 0.4 02/05/2024 1317   BILITOT 0.4 01/02/2024 1016       RADIOGRAPHIC STUDIES: CT Angio Chest PE W and/or Wo Contrast Result Date: 02/05/2024 CLINICAL DATA:  Lightheadedness. Dizziness this morning. On chemotherapy for metastatic lung cancer. Prior radiation therapy. * Tracking Code: BO * EXAM: CT ANGIOGRAPHY CHEST WITH CONTRAST TECHNIQUE: Multidetector CT imaging of the chest was performed using the standard protocol during bolus administration of intravenous contrast. Multiplanar CT image reconstructions and MIPs were obtained to evaluate the vascular anatomy. RADIATION DOSE REDUCTION: This exam was performed according to the departmental dose-optimization program which includes automated exposure control, adjustment of the mA and/or kV according to patient size and/or use of iterative reconstruction technique. CONTRAST:  OMNIPAQUE IOHEXOL 350 MG/ML SOLN COMPARISON:  Chest radiograph of earlier today. Chest CT of 11/17/2023. FINDINGS: Cardiovascular: The quality of this exam for evaluation of pulmonary embolism is moderate. No evidence of pulmonary embolism. Aortic atherosclerosis. Tortuous thoracic aorta. Mild cardiomegaly, without pericardial effusion. An aneurysm of the proximal LAD or distal left main coronary artery measures 1.5 cm on 69/4 and is relatively similar to on the prior. This was characterized back on 01/05/2019 dedicated CTA where it measured 10 mm. Felt to arise from the trifurcation of the left main on that exam. Mediastinum/Nodes: Left supraclavicular low-density structure measures 2.6 x 2.9 cm on 16/4 versus 3.3 x 2.2 cm on the prior. No mediastinal or hilar adenopathy. Lungs/Pleura: New small left pleural effusion. Left upper lobe bandlike opacities and volume loss including on 58/6, are new but favored to represent  atelectasis or developing radiation fibrosis. Poor inspiratory effort with volume loss in both lower lobes. In the left lower lobe, there is new peribronchovascular airspace and ground-glass consolidation including on 79/6. A mixed attenuation right upper lobe pulmonary nodule is similar at 1.0 cm on 37/6. A nodule or area of nodular consolidation in the superior most aspect of the left lower lobe measures 1.8 x 1.0 cm on 37/6 versus 1.5 x 0.9 cm on the prior. Just caudal to this, a superior segment left lower lobe area of nodularity measures 1.2 cm on 49/6 versus similar on the prior. Inferior anterior right upper lobe 5 mm nodule on 67/6 is new. Upper Abdomen: Increasingly heterogeneous appearance of the liver. An anterior left hepatic lobe hypoattenuating lesion of 1.7 cm on 126/4 is slightly less well-defined today. This was non FDG avid and felt to represent a cyst on 07/06/2023 PET.  Normal imaged portions of the spleen, stomach, pancreas, right adrenal gland, left kidney. Upper pole right renal 3.7 cm fluid density lesion is likely a cyst . In the absence of clinically indicated signs/symptoms require(s) no independent follow-up. Left adrenal nodularity is incompletely imaged but grossly similar to on the prior. Musculoskeletal: Probable nodular parenchyma within the lateral right breast on 87/4, similar. Right subscapularis intramuscular lipoma of 4.8 cm. No acute osseous abnormality. Review of the MIP images confirms the above findings. IMPRESSION: 1. Moderate quality evaluation for pulmonary embolism. None identified. 2. Poor inspiratory effort in both lung bases. Peribronchovascular airspace and ground-glass opacity in the left lower lobe is new. Favor interval pneumonia. If the patient has had radiation therapy to this area, evolving radiation change could look similar but is felt less likely. 3. New small left pleural effusion. 4. The underlying presumed infectious process makes evaluation of pulmonary  nodules challenging. Felt to be similar to minimally progressive as detailed above. A new right upper lobe 5 mm pulmonary nodule may represent progressive metastasis. 5. Decreased size of left supraclavicular low-density lesion which is likely a necrotic nodal metastasis. 6. Increasingly heterogeneous appearance of the liver which has been attributed to heterogeneous steatosis on prior exams. If there is a clinical concern of hepatic metastasis, consider further evaluation with nonemergent pre and post contrast abdominal MRI or PET. 7. Slow enlargement of a coronary artery aneurysm (likely off the distal left main) since it was characterized back on 01/05/2019 dedicated CTA. Electronically Signed   By: Jeronimo Greaves M.D.   On: 02/05/2024 15:11   DG Chest Portable 1 View Result Date: 02/05/2024 CLINICAL DATA:  Weakness. Dizziness. Lightheaded. Non-small cell lung cancer. EXAM: PORTABLE CHEST - 1 VIEW COMPARISON:  08/30/2023 FINDINGS: Cardiomediastinal silhouette and pulmonary vasculature are within normal limits. Interval improvement in the aeration of the right lung base. There is new airspace opacity in the left mid lung which may be due to atelectasis or pneumonia. The known lung malignancy is better visualized on prior CT, however likely corresponds to the 1.8 cm spiculated opacity in the left suprahilar region. IMPRESSION: New opacity in the left mid lung possibly due to atelectasis or pneumonia. Electronically Signed   By: Acquanetta Belling M.D.   On: 02/05/2024 13:12    ASSESSMENT AND PLAN: This is a very pleasant 83 years old African-American female diagnosed with Stage IIIC (T3, N3, M0) non-small cell lung cancer, adenocarcinoma presented with supraclavicular lymphadenopathy, 2 left lung nodules diagnosed in September 2024.  There is also a short segment of wall thickening involving the splenic flexure of the colon with associated mild hypermetabolism which is nonspecific and colonoscopy was recommended.   Molecular studies showed positive KRAS G12C mutation and PD-L1 expression of 5%. The patient completed a course of concurrent chemoradiation with weekly carboplatin for AUC of 2 and paclitaxel 45 Mg/M2 started on September 20, 2023.  She is status post 6 week of treatment. The patient is currently undergoing consolidation treatment with immunotherapy with Imfinzi 1500 Mg IV every 4 weeks status post 2 cycles.  The patient has been tolerating this treatment fairly well.     Adenocarcinoma of the Lung Adenocarcinoma of the lung with KRAS G12C mutation and PD-L1 expression of 5%. Currently on Imfinzi every four weeks, with two cycles completed. Third cycle delayed due to recent influenza and complications. Treatment delay discussed to avoid exacerbation. - Delay immunotherapy until recovery - Reassess next week for readiness to resume cycle three  Suspected Pneumonia Flu-like  symptoms with CT scan showing left lung inflammation. Differential includes pneumonia, viral infection, or immune therapy-related inflammation. Discussed potential causes and need for antibiotics and steroids. - Prescribe Medrol Dosepak (6 tablets on day 1, tapering to 1 tablet on day 6) - Continue azithromycin (2 capsules on day 1, then 1 capsule daily for 5 days) - Rest and monitor symptoms - Follow up next week to reassess  Influenza Contracted influenza from her son, with fever, nausea, and diarrhea. Discontinued Tamiflu due to severe diarrhea. Emphasized rest and supportive care. - Rest and supportive care  Follow-up - Schedule follow-up appointment next week - Send Medrol Dosepak prescription to [facility] [facility].   The patient was advised to call immediately if she has any other concerning symptoms in the interval. The patient voices understanding of current disease status and treatment options and is in agreement with the current care plan.  All questions were answered. The patient knows to call the clinic  with any problems, questions or concerns. We can certainly see the patient much sooner if necessary.  The total time spent in the appointment was 30 minutes.  Disclaimer: This note was dictated with voice recognition software. Similar sounding words can inadvertently be transcribed and may not be corrected upon review.

## 2024-02-07 ENCOUNTER — Telehealth: Payer: Self-pay | Admitting: Internal Medicine

## 2024-02-07 ENCOUNTER — Ambulatory Visit: Payer: Medicare Other

## 2024-02-07 LAB — URINE CULTURE: Culture: 20000 — AB

## 2024-02-07 LAB — T4: T4, Total: 10.3 ug/dL (ref 4.5–12.0)

## 2024-02-07 NOTE — Telephone Encounter (Signed)
 Rescheduled appointments and the patient is aware of the changes made.

## 2024-02-08 NOTE — Progress Notes (Signed)
 [facility] OFFICE PROGRESS NOTE  Emilio Aspen, MD [address] [place] [address]  DIAGNOSIS: Stage IIIC (T3, N3, M0) non-small cell lung cancer, adenocarcinoma presented with supraclavicular lymphadenopathy, 2 left lung nodules diagnosed in September 2024.  There is also a short segment of wall thickening involving the splenic flexure of the colon with associated mild hypermetabolism which is nonspecific and colonoscopy was recommended.    Biomarker Findings HRD signature - HRDsig Negative Microsatellite status - MS-Equivocal ? Tumor Mutational Burden - 2 Muts/Mb Genomic Findings For a complete list of the genes assayed, please refer to the Appendix. KRAS G12C TP53 R110L 7 Disease relevant genes with no reportable alterations: ALK, BRAF, EGFR, ERBB2, MET, RET, ROS1   PDL1 Expression 5%.  PRIOR THERAPY: Concurrent chemoradiation with carboplatin for an AUC of 2 and taxol 45 mg/m2. Last dose on 10/24/23. Status post 6 cycles.   CURRENT THERAPY: Consolidation treatment with Imfinzi 1500 Mg IV every 4 weeks status post 2 cycles.   INTERVAL HISTORY: Stephanie Newton 83 y.o. female returns to the clinic today for a follow-up visit accompanied by her husband. The patient was last seen by Dr. Arbutus Ped on 02/06/2024.  Her third cycle of treatment has been delayed due to a recent infection. She was seen in the Er on 02/05/24 emergency room for generalized weakness and flulike symptoms and dizziness. She also felt more short of breath. She had a chest x-ray and CT scan showed being inflammation in the lung and she was treated with augmentin.  Therefore, Dr. Arbutus Ped delayed cycle #3 by week until she can have further improvement in her symptoms. Dr. Arbutus Ped gave her prescription for Medrol Dosepak due to the inflammation in the lungs which could be pneumonia/viral/or immune mediated related inflammation.  As last being seen, she is feeling about 70% better at  this time compared to her baseline.  She denies any more fevers.  Denies any chills, night sweats, or unexplained weight loss.  Her cough and shortness of breath are still present. Her cough is productive with mucus.  She did not cough during her encounter. She states overall it is mild and she had not needed to take anything OTC for this. She denies any more nausea, vomiting, diarrhea, or constipation.  Denies any headache or visual changes.  Denies any rashes or skin changes.  She denies any chest pain. She states she is having extreme fatigue and has no energy. She is getting about 6 hours of sleep at night. She states her appetite has improved since she was sick. She is here today for evaluation and reevaluation prior to starting cycle #3.   MEDICAL HISTORY: Past Medical History:  Diagnosis Date   Arthritis    Dysrhythmia    Hypercholesteremia    Hypertension    Myocardial infarction [facility])     ALLERGIES:  is allergic to shellfish allergy.  MEDICATIONS:  Current Outpatient Medications  Medication Sig Dispense Refill   amLODipine (NORVASC) 2.5 MG tablet Take 2.5 mg by mouth daily.     amoxicillin-clavulanate (AUGMENTIN) 875-125 MG tablet Take 1 tablet by mouth every 12 (twelve) hours. 14 tablet 0   aspirin EC 81 MG tablet Take 1 tablet by mouth daily.     azithromycin (ZITHROMAX) 250 MG tablet Take 2 tablets today, then one tablet each day for the next 6 days. 6 tablet 0   doxycycline (VIBRAMYCIN) 100 MG capsule Take 1 capsule (100 mg total) by mouth 2 (two)  times daily. 20 capsule 0   estradiol (ESTRACE) 1 MG tablet Take 1 mg by mouth daily.     famotidine (PEPCID) 20 MG tablet Take 20 mg by mouth as needed.     hydrochlorothiazide (HYDRODIURIL) 12.5 MG tablet Take 12.5 mg by mouth daily.      methocarbamol (ROBAXIN) 500 MG tablet Take 1 tablet (500 mg total) by mouth every 6 (six) hours as needed for muscle spasms. 40 tablet 0   methylPREDNISolone (MEDROL DOSEPAK) 4 MG TBPK tablet Use  as instructed. 21 tablet 0   metoprolol succinate (TOPROL-XL) 100 MG 24 hr tablet Take 100 mg by mouth daily.     metoprolol succinate (TOPROL-XL) 50 MG 24 hr tablet Take 1 tablet (50 mg total) by mouth daily. 90 tablet 1   oseltamivir (TAMIFLU) 75 MG capsule Take 75 mg by mouth 2 (two) times daily.     oxyCODONE-acetaminophen (PERCOCET/ROXICET) 5-325 MG tablet Take 1-2 tablets by mouth every 8 (eight) hours as needed for severe pain. 6 tablet 0   potassium chloride SA (KLOR-CON M) 20 MEQ tablet Take 1 tablet (20 mEq total) by mouth daily. 7 tablet 0   prochlorperazine (COMPAZINE) 10 MG tablet Take 1 tablet (10 mg total) by mouth every 6 (six) hours as needed. 30 tablet 2   simvastatin (ZOCOR) 20 MG tablet Take 20 mg by mouth at bedtime.     sucralfate (CARAFATE) 1 g tablet Take 1 tablet (1 g total) by mouth 4 (four) times daily -  with meals and at bedtime. 120 tablet 0   No current facility-administered medications for this visit.    SURGICAL HISTORY:  Past Surgical History:  Procedure Laterality Date   BREAST EXCISIONAL BIOPSY Right    x2   BRONCHIAL BIOPSY  08/30/2023   Procedure: BRONCHIAL BIOPSIES;  Surgeon: Josephine Igo, DO;  Location: [facility] ENDOSCOPY;  Service: Pulmonary;;   EYE SURGERY     cataract bilateral    HERNIA REPAIR  06/2000   INCISIONAL VENTRAL ; DR Carman Ching    KNEE ARTHROSCOPY Left 08/23/2017   dr Thomasena Edis    LAPAROSCOPIC CHOLECYSTECTOMY  06/13/2000   DR [facility]    LEFT HEART CATH AND CORONARY ANGIOGRAPHY N/A 12/04/2018   Procedure: LEFT HEART CATH AND CORONARY ANGIOGRAPHY;  Surgeon: Lyn Records, MD;  Location: [facility] INVASIVE CV LAB;  Service: Cardiovascular;  Laterality: N/A;   TOTAL KNEE ARTHROPLASTY Left 03/31/2018   Procedure: LEFT TOTAL KNEE ARTHROPLASTY;  Surgeon: Eugenia Mcalpine, MD;  Location: [facility] ORS;  Service: Orthopedics;  Laterality: Left;  with block   TOTAL KNEE ARTHROPLASTY Right 04/30/2020   Procedure: RIGHT TOTAL KNEE ARTHROPLASTY;   Surgeon: Ollen Gross, MD;  Location: [facility] ORS;  Service: Orthopedics;  Laterality: Right;    VAGINAL HYSTERECTOMY  age 11s    REVIEW OF SYSTEMS:   Review of Systems  Constitutional: Positive for fatigue and generalized weakness. Negative for appetite change, chills, fever and unexpected weight change.  HENT: Negative for mouth sores, nosebleeds, sore throat and trouble swallowing.   Eyes: Negative for eye problems and icterus.  Respiratory: Positive for mild cough and dyspnea on exertion. Negative for hemoptysis and wheezing.   Cardiovascular: Negative for chest pain and leg swelling.  Gastrointestinal: Negative for abdominal pain, constipation, diarrhea, nausea and vomiting.  Genitourinary: Negative for bladder incontinence, difficulty urinating, dysuria, frequency and hematuria.   Musculoskeletal: Negative for back pain, gait problem, neck pain and neck stiffness.  Skin: Negative for itching and rash.  Neurological: Negative for dizziness, extremity weakness, gait problem, headaches, light-headedness and seizures.  Hematological: Negative for adenopathy. Does not bruise/bleed easily.  Psychiatric/Behavioral: Negative for confusion, depression and sleep disturbance. The patient is not nervous/anxious.     PHYSICAL EXAMINATION:  Blood pressure (!) 131/97, pulse 70, temperature 97.9 F (36.6 C), resp. rate 16, weight 165 lb 12.8 oz (75.2 kg), SpO2 100%.  ECOG PERFORMANCE STATUS: 1  Physical Exam  Constitutional: Oriented to person, place, and time and well-developed, well-nourished, and in no distress. HENT:  Head: Normocephalic and atraumatic.  Mouth/Throat: Oropharynx is clear and moist. No oropharyngeal exudate.  Eyes: Conjunctivae are normal. Right eye exhibits no discharge. Left eye exhibits no discharge. No scleral icterus.  Neck: Normal range of motion. Neck supple.  Cardiovascular: Normal rate, regular rhythm, normal heart sounds and intact distal pulses.    Pulmonary/Chest: Effort normal and breath sounds normal. No respiratory distress. No wheezes. No rales.  Abdominal: Soft. Bowel sounds are normal. Exhibits no distension and no mass. There is no tenderness.  Musculoskeletal: Normal range of motion. Exhibits no edema.  Lymphadenopathy:    Positive for right supraclavicular adenopathy (improved compared to prior to starting treatment).  Neurological: Alert and oriented to person, place, and time. Exhibits normal muscle tone. Gait normal. Coordination normal.  Skin: Skin is warm and dry. No rash noted. Not diaphoretic. No erythema. No pallor.  Psychiatric: Mood, memory and judgment normal.  Vitals reviewed.  LABORATORY DATA: Lab Results  Component Value Date   WBC 6.0 02/13/2024   HGB 13.9 02/13/2024   HCT 41.8 02/13/2024   MCV 91.7 02/13/2024   PLT 327 02/13/2024      Chemistry      Component Value Date/Time   NA 137 02/13/2024 1417   NA 139 10/19/2022 1030   K 4.2 02/13/2024 1417   CL 105 02/13/2024 1417   CO2 27 02/13/2024 1417   BUN 14 02/13/2024 1417   BUN 13 10/19/2022 1030   CREATININE 0.78 02/13/2024 1417      Component Value Date/Time   CALCIUM 9.4 02/13/2024 1417   ALKPHOS 80 02/13/2024 1417   AST 31 02/13/2024 1417   ALT 26 02/13/2024 1417   BILITOT 0.5 02/13/2024 1417       RADIOGRAPHIC STUDIES:  CT Angio Chest PE W and/or Wo Contrast Result Date: 02/05/2024 CLINICAL DATA:  Lightheadedness. Dizziness this morning. On chemotherapy for metastatic lung cancer. Prior radiation therapy. * Tracking Code: BO * EXAM: CT ANGIOGRAPHY CHEST WITH CONTRAST TECHNIQUE: Multidetector CT imaging of the chest was performed using the standard protocol during bolus administration of intravenous contrast. Multiplanar CT image reconstructions and MIPs were obtained to evaluate the vascular anatomy. RADIATION DOSE REDUCTION: This exam was performed according to the departmental dose-optimization program which includes automated  exposure control, adjustment of the mA and/or kV according to patient size and/or use of iterative reconstruction technique. CONTRAST:  OMNIPAQUE IOHEXOL 350 MG/ML SOLN COMPARISON:  Chest radiograph of earlier today. Chest CT of 11/17/2023. FINDINGS: Cardiovascular: The quality of this exam for evaluation of pulmonary embolism is moderate. No evidence of pulmonary embolism. Aortic atherosclerosis. Tortuous thoracic aorta. Mild cardiomegaly, without pericardial effusion. An aneurysm of the proximal LAD or distal left main coronary artery measures 1.5 cm on 69/4 and is relatively similar to on the prior. This was characterized back on 01/05/2019 dedicated CTA where it measured 10 mm. Felt to arise from the trifurcation of the left main on that exam. Mediastinum/Nodes: Left supraclavicular low-density structure measures  2.6 x 2.9 cm on 16/4 versus 3.3 x 2.2 cm on the prior. No mediastinal or hilar adenopathy. Lungs/Pleura: New small left pleural effusion. Left upper lobe bandlike opacities and volume loss including on 58/6, are new but favored to represent atelectasis or developing radiation fibrosis. Poor inspiratory effort with volume loss in both lower lobes. In the left lower lobe, there is new peribronchovascular airspace and ground-glass consolidation including on 79/6. A mixed attenuation right upper lobe pulmonary nodule is similar at 1.0 cm on 37/6. A nodule or area of nodular consolidation in the superior most aspect of the left lower lobe measures 1.8 x 1.0 cm on 37/6 versus 1.5 x 0.9 cm on the prior. Just caudal to this, a superior segment left lower lobe area of nodularity measures 1.2 cm on 49/6 versus similar on the prior. Inferior anterior right upper lobe 5 mm nodule on 67/6 is new. Upper Abdomen: Increasingly heterogeneous appearance of the liver. An anterior left hepatic lobe hypoattenuating lesion of 1.7 cm on 126/4 is slightly less well-defined today. This was non FDG avid and felt to  represent a cyst on 07/06/2023 PET. Normal imaged portions of the spleen, stomach, pancreas, right adrenal gland, left kidney. Upper pole right renal 3.7 cm fluid density lesion is likely a cyst . In the absence of clinically indicated signs/symptoms require(s) no independent follow-up. Left adrenal nodularity is incompletely imaged but grossly similar to on the prior. Musculoskeletal: Probable nodular parenchyma within the lateral right breast on 87/4, similar. Right subscapularis intramuscular lipoma of 4.8 cm. No acute osseous abnormality. Review of the MIP images confirms the above findings. IMPRESSION: 1. Moderate quality evaluation for pulmonary embolism. None identified. 2. Poor inspiratory effort in both lung bases. Peribronchovascular airspace and ground-glass opacity in the left lower lobe is new. Favor interval pneumonia. If the patient has had radiation therapy to this area, evolving radiation change could look similar but is felt less likely. 3. New small left pleural effusion. 4. The underlying presumed infectious process makes evaluation of pulmonary nodules challenging. Felt to be similar to minimally progressive as detailed above. A new right upper lobe 5 mm pulmonary nodule may represent progressive metastasis. 5. Decreased size of left supraclavicular low-density lesion which is likely a necrotic nodal metastasis. 6. Increasingly heterogeneous appearance of the liver which has been attributed to heterogeneous steatosis on prior exams. If there is a clinical concern of hepatic metastasis, consider further evaluation with nonemergent pre and post contrast abdominal MRI or PET. 7. Slow enlargement of a coronary artery aneurysm (likely off the distal left main) since it was characterized back on 01/05/2019 dedicated CTA. Electronically Signed   By: Jeronimo Greaves M.D.   On: 02/05/2024 15:11   DG Chest Portable 1 View Result Date: 02/05/2024 CLINICAL DATA:  Weakness. Dizziness. Lightheaded. Non-small  cell lung cancer. EXAM: PORTABLE CHEST - 1 VIEW COMPARISON:  08/30/2023 FINDINGS: Cardiomediastinal silhouette and pulmonary vasculature are within normal limits. Interval improvement in the aeration of the right lung base. There is new airspace opacity in the left mid lung which may be due to atelectasis or pneumonia. The known lung malignancy is better visualized on prior CT, however likely corresponds to the 1.8 cm spiculated opacity in the left suprahilar region. IMPRESSION: New opacity in the left mid lung possibly due to atelectasis or pneumonia. Electronically Signed   By: Acquanetta Belling M.D.   On: 02/05/2024 13:12     ASSESSMENT/PLAN:  This is a very pleasant 83 year old African-American female  diagnosed with Stage IIIC (T3, N3, M0) non-small cell lung cancer, adenocarcinoma presented with supraclavicular lymphadenopathy, 2 left lung nodules diagnosed in September 2024.  There is also a short segment of wall thickening involving the splenic flexure of the colon with associated mild hypermetabolism which is nonspecific and colonoscopy was recommended.  Molecular studies showed positive KRAS G12C mutation and PD-L1 expression of 5%. The patient completed a course of concurrent chemoradiation with weekly carboplatin for AUC of 2 and paclitaxel 45 Mg/M2 started on September 20, 2023.  She is status post 6 week of treatment.  Last day chemotherapy was on 10/24/23.   She is currently on consolidation immunotherapy with Imfinzi 1500 mg IV every 4 weeks.  She is status post 2 cycles.  Cycle #3 was delayed by a few weeks due to flu.  Her symptoms from the flu have improved about 70%. I reviewed her ER CT images with Dr. Arbutus Ped. Overall, he does not feel this is pneumonitis. She may be recovering from infection for which she was treated with antibiotics and steroids. Her oxygen is 100%. She is well appearing. She states her cough is mild. She continues to have fatigue though.   The patient had some questions  about the new 5 mm nodule in the lung. I let her know we will monitor this on future imaging studies. It is below the size threshold of Pet detection.   She will proceed cycle #3 tomorrow as scheduled.   We will see her back for labs and a follow-up visit in 4 weeks before undergoing cycle #4.  Of course should she have any worsening symptoms or breathing changes she was advised to call us for reevaluation and we can always reassess her in the [facility].  The patient was advised to call immediately if she has any concerning symptoms in the interval. The patient voices understanding of current disease status and treatment options and is in agreement with the current care plan. All questions were answered. The patient knows to call the clinic with any problems, questions or concerns. We can certainly see the patient much sooner if necessary.  No orders of the defined types were placed in this encounter.  The total time spent in the appointment was 20-29 minutes  Trei Schoch L Saifan Rayford, PA-C 02/13/24

## 2024-02-09 ENCOUNTER — Ambulatory Visit: Payer: Medicare Other | Admitting: Internal Medicine

## 2024-02-09 DIAGNOSIS — I2541 Coronary artery aneurysm: Secondary | ICD-10-CM

## 2024-02-09 DIAGNOSIS — I2510 Atherosclerotic heart disease of native coronary artery without angina pectoris: Secondary | ICD-10-CM

## 2024-02-09 DIAGNOSIS — I700 Atherosclerosis of aorta: Secondary | ICD-10-CM

## 2024-02-09 DIAGNOSIS — R7303 Prediabetes: Secondary | ICD-10-CM

## 2024-02-09 DIAGNOSIS — I10 Essential (primary) hypertension: Secondary | ICD-10-CM

## 2024-02-09 DIAGNOSIS — Z6828 Body mass index (BMI) 28.0-28.9, adult: Secondary | ICD-10-CM

## 2024-02-09 DIAGNOSIS — E785 Hyperlipidemia, unspecified: Secondary | ICD-10-CM

## 2024-02-13 ENCOUNTER — Inpatient Hospital Stay

## 2024-02-13 ENCOUNTER — Inpatient Hospital Stay (HOSPITAL_BASED_OUTPATIENT_CLINIC_OR_DEPARTMENT_OTHER): Admitting: Physician Assistant

## 2024-02-13 VITALS — BP 131/97 | HR 70 | Temp 97.9°F | Resp 16 | Wt 165.8 lb

## 2024-02-13 DIAGNOSIS — C3412 Malignant neoplasm of upper lobe, left bronchus or lung: Secondary | ICD-10-CM | POA: Diagnosis not present

## 2024-02-13 DIAGNOSIS — Z5112 Encounter for antineoplastic immunotherapy: Secondary | ICD-10-CM | POA: Insufficient documentation

## 2024-02-13 DIAGNOSIS — R0602 Shortness of breath: Secondary | ICD-10-CM | POA: Diagnosis not present

## 2024-02-13 DIAGNOSIS — R059 Cough, unspecified: Secondary | ICD-10-CM | POA: Diagnosis not present

## 2024-02-13 DIAGNOSIS — R531 Weakness: Secondary | ICD-10-CM | POA: Diagnosis not present

## 2024-02-13 DIAGNOSIS — R42 Dizziness and giddiness: Secondary | ICD-10-CM | POA: Diagnosis not present

## 2024-02-13 LAB — CBC WITH DIFFERENTIAL (CANCER CENTER ONLY)
Abs Immature Granulocytes: 0.04 10*3/uL (ref 0.00–0.07)
Basophils Absolute: 0 10*3/uL (ref 0.0–0.1)
Basophils Relative: 0 %
Eosinophils Absolute: 0 10*3/uL (ref 0.0–0.5)
Eosinophils Relative: 1 %
HCT: 41.8 % (ref 36.0–46.0)
Hemoglobin: 13.9 g/dL (ref 12.0–15.0)
Immature Granulocytes: 1 %
Lymphocytes Relative: 8 %
Lymphs Abs: 0.5 10*3/uL — ABNORMAL LOW (ref 0.7–4.0)
MCH: 30.5 pg (ref 26.0–34.0)
MCHC: 33.3 g/dL (ref 30.0–36.0)
MCV: 91.7 fL (ref 80.0–100.0)
Monocytes Absolute: 0.6 10*3/uL (ref 0.1–1.0)
Monocytes Relative: 10 %
Neutro Abs: 4.8 10*3/uL (ref 1.7–7.7)
Neutrophils Relative %: 80 %
Platelet Count: 327 10*3/uL (ref 150–400)
RBC: 4.56 MIL/uL (ref 3.87–5.11)
RDW: 12.4 % (ref 11.5–15.5)
WBC Count: 6 10*3/uL (ref 4.0–10.5)
nRBC: 0 % (ref 0.0–0.2)

## 2024-02-13 LAB — CMP (CANCER CENTER ONLY)
ALT: 26 U/L (ref 0–44)
AST: 31 U/L (ref 15–41)
Albumin: 3.9 g/dL (ref 3.5–5.0)
Alkaline Phosphatase: 80 U/L (ref 38–126)
Anion gap: 5 (ref 5–15)
BUN: 14 mg/dL (ref 8–23)
CO2: 27 mmol/L (ref 22–32)
Calcium: 9.4 mg/dL (ref 8.9–10.3)
Chloride: 105 mmol/L (ref 98–111)
Creatinine: 0.78 mg/dL (ref 0.44–1.00)
GFR, Estimated: >60 mL/min (ref >60)
Glucose, Bld: 118 mg/dL — ABNORMAL HIGH (ref 70–99)
Potassium: 4.2 mmol/L (ref 3.5–5.1)
Sodium: 137 mmol/L (ref 135–145)
Total Bilirubin: 0.5 mg/dL (ref 0.0–1.2)
Total Protein: 6.5 g/dL (ref 6.5–8.1)

## 2024-02-13 LAB — TSH: TSH: 0.913 u[IU]/mL (ref 0.350–4.500)

## 2024-02-14 ENCOUNTER — Inpatient Hospital Stay

## 2024-02-14 VITALS — BP 133/69 | HR 68 | Temp 97.7°F | Resp 16

## 2024-02-14 DIAGNOSIS — R531 Weakness: Secondary | ICD-10-CM | POA: Diagnosis not present

## 2024-02-14 DIAGNOSIS — Z5112 Encounter for antineoplastic immunotherapy: Secondary | ICD-10-CM | POA: Diagnosis not present

## 2024-02-14 DIAGNOSIS — C3412 Malignant neoplasm of upper lobe, left bronchus or lung: Secondary | ICD-10-CM

## 2024-02-14 DIAGNOSIS — R42 Dizziness and giddiness: Secondary | ICD-10-CM | POA: Diagnosis not present

## 2024-02-14 DIAGNOSIS — R059 Cough, unspecified: Secondary | ICD-10-CM | POA: Diagnosis not present

## 2024-02-14 DIAGNOSIS — R0602 Shortness of breath: Secondary | ICD-10-CM | POA: Diagnosis not present

## 2024-02-14 LAB — T4: T4, Total: 8.7 ug/dL (ref 4.5–12.0)

## 2024-02-14 MED ORDER — SODIUM CHLORIDE 0.9 % IV SOLN: INTRAVENOUS | Status: DC

## 2024-02-14 MED ORDER — SODIUM CHLORIDE 0.9 % IV SOLN
1500.0000 mg | Freq: Once | INTRAVENOUS | Status: AC
  Administered 2024-02-14: 1500 mg via INTRAVENOUS
  Filled 2024-02-14: qty 30

## 2024-02-14 NOTE — Patient Instructions (Signed)

## 2024-02-21 ENCOUNTER — Other Ambulatory Visit: Payer: Self-pay | Admitting: Physician Assistant

## 2024-02-27 ENCOUNTER — Ambulatory Visit: Payer: Medicare Other | Admitting: Internal Medicine

## 2024-02-27 ENCOUNTER — Ambulatory Visit: Payer: Medicare Other

## 2024-02-27 ENCOUNTER — Other Ambulatory Visit: Payer: Medicare Other

## 2024-03-05 ENCOUNTER — Ambulatory Visit: Payer: Medicare Other

## 2024-03-05 ENCOUNTER — Ambulatory Visit: Payer: Medicare Other | Admitting: Internal Medicine

## 2024-03-05 ENCOUNTER — Other Ambulatory Visit: Payer: Medicare Other

## 2024-03-06 NOTE — Progress Notes (Unsigned)
 Cardiology Office Note:   Date:  03/07/2024  ID:  Stephanie Newton, DOB [birth year], MRN [id number] PCP:  Emilio Aspen, MD  [facility] Providers Cardiologist:  Alverda Skeans, MD Referring MD: Emilio Aspen, *  Chief Complaint/Reason for Referral: Left main aneurysm, hypertension ASSESSMENT:    1. Coronary artery aneurysm   2. CAD in native artery   3. Hyperlipidemia LDL goal <70   4. Aortic atherosclerosis (HCC)   5. Essential hypertension   6. Prediabetes   7. BMI 28.0-28.9,adult   8. Adenocarcinoma of upper lobe of left lung (HCC)   9. Pre-procedural examination      PLAN:   In order of problems listed above: Left main aneurysm: Will obtain coronary CTA for routine imaging.  With the lack of symptoms would only consider intervention for a size of 20 mm.  Given her age and cancer history I do not think she would be a candidate for surgical intervention.  If this were the case we would need to use a covered stent and an off label indication. Coronary artery disease: Continue aspirin 81 mg daily, simvastatin 20 mg daily. Hyperlipidemia: LDL was 48 last year, defer LpA testing, check lipids/LFTs (she will do this with PCP). Aortic atherosclerosis: Continue aspirin 81 mg daily, simvastatin 20 mg daily, and strict blood pressure control. Hypertension: Blood pressure is well-controlled.  Continue hydrochlorothiazide 12.5 mg daily and Toprol-XL 150 mg daily Prediabetes: Continue aspirin 81 mg, strict blood pressure control, and simvastatin 20 mg. Elevated BMI: Diet and exercise modification. Lung cancer:  Followed by oncology            Dispo:  Return in about 1 year (around 03/07/2025).      Medication Adjustments/Labs and Tests Ordered: Current medicines are reviewed at length with the patient today.  Concerns regarding medicines are outlined above.  The following changes have been made:  no change   Labs/tests ordered: Orders Placed This Encounter   Procedures   CT CORONARY MORPH W/CTA COR W/SCORE W/CA W/CM &/OR WO/CM   Basic metabolic panel with GFR    Medication Changes: Meds ordered this encounter  Medications   metoprolol tartrate (LOPRESSOR) 100 MG tablet    Sig: Take 1 tablet (100 mg total) by mouth once for 1 dose. Take 90-120 minutes prior to scan. Hold for SBP less than 110.    Dispense:  1 tablet    Refill:  0    Current medicines are reviewed at length with the patient today.  The patient does not have concerns regarding medicines.  I spent 33 minutes reviewing all clinical data during and prior to this visit including all relevant imaging studies, laboratories, clinical information from other health systems and prior notes from both Cardiology and other specialties, interviewing the patient, conducting a complete physical examination, and coordinating care in order to formulate a comprehensive and personalized evaluation and treatment plan.   History of Present Illness:      FOCUSED PROBLEM LIST:   Left main saccular aneurysm >> consider intervention for asymptomatic aneurysm at 20 mm 8.5 x 8.5 x 10 mm 2020 12 x 10 x 8 mm 2021 12 x 10 x 9 mm 2022 13 x 10 x 10 mm 2023 CAD Minimal, nonobstructive disease see coronary CTA 2023 Hyperlipidemia Aortic atherosclerosis Chest CT 2024 Hypertension Prediabetes Hemoglobin A1c 6.2 2023 BMI 28 Stage IIIC non-small cell lung cancer  April 2025:  Patient consents to use of AI scribe. The patient returns for routine follow-up.  The patient was last seen in March 2024.  At that point in time she was doing well without cardiovascular complaints.  Her blood pressure was under good control.  She has metastatic lung cancer and has completed three months of radiation and chemotherapy. She is now on a maintenance chemotherapy regimen once a month for a year. Her condition was stable until she contracted the flu and pneumonia three weeks ago, leading to weight loss and a prolonged  recovery. She is about 97% recovered but still experiences shortness of breath.  She is prediabetic and does not have diabetes at this time. She has experienced episodes of low blood pressure, with readings as low as 90/40, causing dizziness and staggering. Her blood pressure is generally well-controlled. Hydrochlorothiazide was discontinued to manage these episodes.  She has a history of bilateral total knee replacements, which contribute to leg swelling. No chest pain, lightheadedness, blacking out spells, or difficulty breathing when lying flat. She has not experienced any issues with her current medications.          Current Medications: Current Meds  Medication Sig   amLODipine (NORVASC) 2.5 MG tablet Take 2.5 mg by mouth daily.   amoxicillin-clavulanate (AUGMENTIN) 875-125 MG tablet Take 1 tablet by mouth every 12 (twelve) hours.   aspirin EC 81 MG tablet Take 1 tablet by mouth daily.   doxycycline (VIBRAMYCIN) 100 MG capsule Take 1 capsule (100 mg total) by mouth 2 (two) times daily.   estradiol (ESTRACE) 1 MG tablet Take 1 mg by mouth daily.   famotidine (PEPCID) 20 MG tablet Take 20 mg by mouth as needed.   hydrochlorothiazide (HYDRODIURIL) 12.5 MG tablet Take 12.5 mg by mouth daily.    methocarbamol (ROBAXIN) 500 MG tablet Take 1 tablet (500 mg total) by mouth every 6 (six) hours as needed for muscle spasms.   methylPREDNISolone (MEDROL DOSEPAK) 4 MG TBPK tablet Use as instructed.   metoprolol succinate (TOPROL-XL) 100 MG 24 hr tablet Take 100 mg by mouth daily.   metoprolol succinate (TOPROL-XL) 50 MG 24 hr tablet TAKE 1 TABLET BY MOUTH DAILY   metoprolol tartrate (LOPRESSOR) 100 MG tablet Take 1 tablet (100 mg total) by mouth once for 1 dose. Take 90-120 minutes prior to scan. Hold for SBP less than 110.   oxyCODONE-acetaminophen (PERCOCET/ROXICET) 5-325 MG tablet Take 1-2 tablets by mouth every 8 (eight) hours as needed for severe pain.   prochlorperazine (COMPAZINE) 10 MG tablet  Take 1 tablet (10 mg total) by mouth every 6 (six) hours as needed.   simvastatin (ZOCOR) 20 MG tablet Take 20 mg by mouth at bedtime.   sucralfate (CARAFATE) 1 g tablet Take 1 tablet (1 g total) by mouth 4 (four) times daily -  with meals and at bedtime.     Review of Systems:   Please see the history of present illness.    All other systems reviewed and are negative.     EKGs/Labs/Other Test Reviewed:   EKG: EKG from July 2024 demonstrates sinus bradycardia with inferior infarction pattern  EKG Interpretation Date/Time:    Ventricular Rate:    PR Interval:    QRS Duration:    QT Interval:    QTC Calculation:   R Axis:      Text Interpretation:           Risk Assessment/Calculations:          Physical Exam:   VS:  BP 128/80 (BP Location: Left Arm, Patient Position: Sitting, Cuff Size: Normal)  Pulse 70   Resp 16   Ht 5\' 5"  (1.651 m)   Wt 167 lb 6.4 oz (75.9 kg)   SpO2 98%   BMI 27.86 kg/m        Wt Readings from Last 3 Encounters:  03/07/24 167 lb 6.4 oz (75.9 kg)  02/13/24 165 lb 12.8 oz (75.2 kg)  02/06/24 168 lb 9.6 oz (76.5 kg)      GENERAL:  No apparent distress, AOx3 HEENT:  No carotid bruits, +2 carotid impulses, no scleral icterus CAR: RRR no murmurs, gallops, rubs, or thrills RES:  Clear to auscultation bilaterally ABD:  Soft, nontender, nondistended, positive bowel sounds x 4 VASC:  +2 radial pulses, +2 carotid pulses NEURO:  CN 2-12 grossly intact; motor and sensory grossly intact PSYCH:  No active depression or anxiety EXT:  No edema, ecchymosis, or cyanosis  Signed, Orbie Pyo, MD  03/07/2024 3:29 PM    [facility] [address] [place], [place], [place]  [address] Phone: [phone]; Fax: [phone]   Note:  This document was prepared using Dragon voice recognition software and may include unintentional dictation errors.

## 2024-03-07 ENCOUNTER — Ambulatory Visit: Attending: Internal Medicine | Admitting: Internal Medicine

## 2024-03-07 ENCOUNTER — Encounter: Payer: Self-pay | Admitting: Internal Medicine

## 2024-03-07 VITALS — BP 128/80 | HR 70 | Resp 16 | Ht 65.0 in | Wt 167.4 lb

## 2024-03-07 DIAGNOSIS — Z6828 Body mass index (BMI) 28.0-28.9, adult: Secondary | ICD-10-CM | POA: Insufficient documentation

## 2024-03-07 DIAGNOSIS — I700 Atherosclerosis of aorta: Secondary | ICD-10-CM | POA: Diagnosis not present

## 2024-03-07 DIAGNOSIS — C3412 Malignant neoplasm of upper lobe, left bronchus or lung: Secondary | ICD-10-CM | POA: Insufficient documentation

## 2024-03-07 DIAGNOSIS — I2541 Coronary artery aneurysm: Secondary | ICD-10-CM | POA: Insufficient documentation

## 2024-03-07 DIAGNOSIS — Z01818 Encounter for other preprocedural examination: Secondary | ICD-10-CM | POA: Diagnosis not present

## 2024-03-07 DIAGNOSIS — E785 Hyperlipidemia, unspecified: Secondary | ICD-10-CM | POA: Diagnosis not present

## 2024-03-07 DIAGNOSIS — I2510 Atherosclerotic heart disease of native coronary artery without angina pectoris: Secondary | ICD-10-CM | POA: Insufficient documentation

## 2024-03-07 DIAGNOSIS — R7303 Prediabetes: Secondary | ICD-10-CM | POA: Insufficient documentation

## 2024-03-07 DIAGNOSIS — I10 Essential (primary) hypertension: Secondary | ICD-10-CM | POA: Diagnosis not present

## 2024-03-07 MED ORDER — METOPROLOL TARTRATE 100 MG PO TABS: 100.0000 mg | ORAL_TABLET | Freq: Once | ORAL | 0 refills | Status: AC

## 2024-03-07 NOTE — Patient Instructions (Addendum)
 Medication Instructions:  No changes *If you need a refill on your cardiac medications before your next appointment, please call your pharmacy*  Lab Work: Go to [employer] for blood work (bmet) before CT scan  If you have labs (blood work) drawn today and your tests are completely normal, you will receive your results only by: [employer] (if you have MyChart) OR A paper copy in the mail If you have any lab test that is abnormal or we need to change your treatment, we will call you to review the results.  Testing/Procedures: Coronary CT Angiogram - see instructions below.  Follow-Up: At [facility], you and your health needs are our priority.  As part of our continuing mission to provide you with exceptional heart care, our providers are all part of one team.  This team includes your primary Cardiologist (physician) and Advanced Practice Providers or APPs (Physician Assistants and Nurse Practitioners) who all work together to provide you with the care you need, when you need it.  Your next appointment:   12 month(s)  Provider:   Orbie Pyo, MD    We recommend signing up for the patient portal called "MyChart".  Sign up information is provided on this After Visit Summary.  MyChart is used to connect with patients for Virtual Visits (Telemedicine).  Patients are able to view lab/test results, encounter notes, upcoming appointments, etc.  Non-urgent messages can be sent to your provider as well.   To learn more about what you can do with MyChart, go to [email or web address].    Other Instructions Coronary CTA for Left main Aneurysm    Your cardiac CT will be scheduled at one of the below locations:   [facility] [address] [place], [place] [address] [phone]   If scheduled at [facility], please arrive at the [facility] and Children's Entrance (Entrance C2) of [facility] 30 minutes prior to test start time. You can use the  FREE valet parking offered at entrance C (encouraged to control the heart rate for the test)  Proceed to the [facility] Radiology Department (first floor) to check-in and test prep.  All radiology patients and guests should use entrance C2 at [facility], accessed from [facility], even though the hospital's physical address listed is [address].    Please follow these instructions carefully (unless otherwise directed):  An IV will be required for this test and Nitroglycerin will be given.  Hold all erectile dysfunction medications at least 3 days (72 hrs) prior to test. (Ie viagra, cialis, sildenafil, tadalafil, etc)   On the Night Before the Test: Be sure to Drink plenty of water. Do not consume any caffeinated/decaffeinated beverages or chocolate 12 hours prior to your test. Do not take any antihistamines 12 hours prior to your test.  On the Day of the Test: Drink plenty of water until 1 hour prior to the test. Do not eat any food 1 hour prior to test. You may take your regular medications prior to the test.  Take metoprolol (Lopressor) two hours prior to test. If you take Furosemide/Hydrochlorothiazide/Spironolactone/Chlorthalidone, please HOLD on the morning of the test. Patients who wear a continuous glucose monitor MUST remove the device prior to scanning. FEMALES- please wear underwire-free bra if available, avoid dresses & tight clothing      After the Test: Drink plenty of water. After receiving IV contrast, you may experience a mild flushed feeling. This is normal. On occasion,  you may experience a mild rash up to 24 hours after the test. This is not dangerous. If this occurs, you can take Benadryl 25 mg, Zyrtec, Claritin, or Allegra and increase your fluid intake. (Patients taking Tikosyn should avoid Benadryl, and may take Zyrtec, Claritin, or Allegra) If you experience trouble breathing, this can be serious. If it is severe call 911  IMMEDIATELY. If it is mild, please call our office.  We will call to schedule your test 2-4 weeks out understanding that some insurance companies will need an authorization prior to the service being performed.   For more information and frequently asked questions, please visit our website : [email or web address]  For non-scheduling related questions, please contact the cardiac imaging nurse navigator should you have any questions/concerns: Cardiac Imaging Nurse Navigators Direct Office Dial: [phone]   For scheduling needs, including cancellations and rescheduling, please call [place], [phone].       1st Floor: - Lobby - Registration  - Pharmacy  - Lab - Cafe  2nd Floor: - PV Lab - Diagnostic Testing (echo, CT, nuclear med)  3rd Floor: - Vacant  4th Floor: - TCTS (cardiothoracic surgery) - AFib Clinic - Structural Heart Clinic - Vascular Surgery  - Vascular Ultrasound  5th Floor: - HeartCare Cardiology (general and EP) - Clinical Pharmacy for coumadin, hypertension, lipid, weight-loss medications, and med management appointments    Valet parking services will be available as well.

## 2024-03-08 ENCOUNTER — Encounter: Payer: Self-pay | Admitting: Internal Medicine

## 2024-03-08 LAB — BASIC METABOLIC PANEL WITH GFR
BUN/Creatinine Ratio: 9 — ABNORMAL LOW (ref 12–28)
BUN: 7 mg/dL — ABNORMAL LOW (ref 8–27)
CO2: 20 mmol/L (ref 20–29)
Calcium: 10.2 mg/dL (ref 8.7–10.3)
Chloride: 103 mmol/L (ref 96–106)
Creatinine, Ser: 0.81 mg/dL (ref 0.57–1.00)
Glucose: 143 mg/dL — ABNORMAL HIGH (ref 70–99)
Potassium: 4.7 mmol/L (ref 3.5–5.2)
Sodium: 140 mmol/L (ref 134–144)
eGFR: 73 mL/min/{1.73_m2} (ref >59)

## 2024-03-09 NOTE — Progress Notes (Signed)
 [facility] OFFICE PROGRESS NOTE  Emilio Aspen, MD [address] [place] [address]  DIAGNOSIS: Stage IIIC (T3, N3, M0) non-small cell lung cancer, adenocarcinoma presented with supraclavicular lymphadenopathy, 2 left lung nodules diagnosed in September 2024.  There is also a short segment of wall thickening involving the splenic flexure of the colon with associated mild hypermetabolism which is nonspecific and colonoscopy was recommended.    Biomarker Findings HRD signature - HRDsig Negative Microsatellite status - MS-Equivocal ? Tumor Mutational Burden - 2 Muts/Mb Genomic Findings For a complete list of the genes assayed, please refer to the Appendix. KRAS G12C TP53 R110L 7 Disease relevant genes with no reportable alterations: ALK, BRAF, EGFR, ERBB2, MET, RET, ROS1   PDL1 Expression 5%.  PRIOR THERAPY: Concurrent chemoradiation with carboplatin for an AUC of 2 and taxol 45 mg/m2. Last dose on 10/24/23. Status post 6 cycles.   CURRENT THERAPY: Consolidation treatment with Imfinzi 1500 Mg IV every 4 weeks status post 3 cycles.   INTERVAL HISTORY: Stephanie Newton 83 y.o. female returns  to the clinic today for a follow-up visit accompanied by her husband. The patient was last seen on 02/13/24. She resumed her treatment witn immunotherapy which she tolerated well. This had been delayed due to flu like illness.    She is feeling 99.9% better. She denies any more fevers.  Denies any chills, night sweats, or unexplained weight loss.  She gained 2 lbs back that she had lost when she was sick. Her cough and shortness of breath both improved. She still gets reflux 1x per week for which she takes pepcid. She denies any nausea, vomiting, diarrhea, or constipation.  Denies any headache or visual changes.  Denies any rashes or skin changes. She denies any chest pain. She states her energy is almost back to her baseline. She is concerned about the 5 mm  nodule seen on her last CTA. She is also getting a CT coronary scan next week. She is here today for evaluation and reevaluation prior to starting cycle #4.   MEDICAL HISTORY: Past Medical History:  Diagnosis Date   Arthritis    Dysrhythmia    Hypercholesteremia    Hypertension    Myocardial infarction [facility])     ALLERGIES:  is allergic to shellfish allergy.  MEDICATIONS:  Current Outpatient Medications  Medication Sig Dispense Refill   amLODipine (NORVASC) 2.5 MG tablet Take 2.5 mg by mouth daily.     amoxicillin-clavulanate (AUGMENTIN) 875-125 MG tablet Take 1 tablet by mouth every 12 (twelve) hours. 14 tablet 0   aspirin EC 81 MG tablet Take 1 tablet by mouth daily.     doxycycline (VIBRAMYCIN) 100 MG capsule Take 1 capsule (100 mg total) by mouth 2 (two) times daily. 20 capsule 0   estradiol (ESTRACE) 1 MG tablet Take 1 mg by mouth daily.     famotidine (PEPCID) 20 MG tablet Take 20 mg by mouth as needed.     hydrochlorothiazide (HYDRODIURIL) 12.5 MG tablet Take 12.5 mg by mouth daily.      methocarbamol (ROBAXIN) 500 MG tablet Take 1 tablet (500 mg total) by mouth every 6 (six) hours as needed for muscle spasms. 40 tablet 0   methylPREDNISolone (MEDROL DOSEPAK) 4 MG TBPK tablet Use as instructed. 21 tablet 0   metoprolol succinate (TOPROL-XL) 100 MG 24 hr tablet Take 100 mg by mouth daily.     metoprolol succinate (TOPROL-XL) 50 MG 24 hr tablet TAKE 1 TABLET  BY MOUTH DAILY 90 tablet 0   metoprolol tartrate (LOPRESSOR) 100 MG tablet Take 1 tablet (100 mg total) by mouth once for 1 dose. Take 90-120 minutes prior to scan. Hold for SBP less than 110. 1 tablet 0   oxyCODONE-acetaminophen (PERCOCET/ROXICET) 5-325 MG tablet Take 1-2 tablets by mouth every 8 (eight) hours as needed for severe pain. 6 tablet 0   prochlorperazine (COMPAZINE) 10 MG tablet Take 1 tablet (10 mg total) by mouth every 6 (six) hours as needed. 30 tablet 2   simvastatin (ZOCOR) 20 MG tablet Take 20 mg by mouth at  bedtime.     sucralfate (CARAFATE) 1 g tablet Take 1 tablet (1 g total) by mouth 4 (four) times daily -  with meals and at bedtime. 120 tablet 0   No current facility-administered medications for this visit.    SURGICAL HISTORY:  Past Surgical History:  Procedure Laterality Date   BREAST EXCISIONAL BIOPSY Right    x2   BRONCHIAL BIOPSY  08/30/2023   Procedure: BRONCHIAL BIOPSIES;  Surgeon: Josephine Igo, DO;  Location: [facility] ENDOSCOPY;  Service: Pulmonary;;   EYE SURGERY     cataract bilateral    HERNIA REPAIR  06/2000   INCISIONAL VENTRAL ; DR Carman Ching    KNEE ARTHROSCOPY Left 08/23/2017   dr Thomasena Edis    LAPAROSCOPIC CHOLECYSTECTOMY  06/13/2000   DR [facility]    LEFT HEART CATH AND CORONARY ANGIOGRAPHY N/A 12/04/2018   Procedure: LEFT HEART CATH AND CORONARY ANGIOGRAPHY;  Surgeon: Lyn Records, MD;  Location: [facility] INVASIVE CV LAB;  Service: Cardiovascular;  Laterality: N/A;   TOTAL KNEE ARTHROPLASTY Left 03/31/2018   Procedure: LEFT TOTAL KNEE ARTHROPLASTY;  Surgeon: Eugenia Mcalpine, MD;  Location: [facility] ORS;  Service: Orthopedics;  Laterality: Left;  with block   TOTAL KNEE ARTHROPLASTY Right 04/30/2020   Procedure: RIGHT TOTAL KNEE ARTHROPLASTY;  Surgeon: Ollen Gross, MD;  Location: [facility] ORS;  Service: Orthopedics;  Laterality: Right;    VAGINAL HYSTERECTOMY  age 80s    REVIEW OF SYSTEMS:   Review of Systems  Constitutional: Improved fatigue. Negative for appetite change, chills, fever and unexpected weight change.  HENT: Negative for mouth sores, nosebleeds, sore throat and trouble swallowing.   Eyes: Negative for eye problems and icterus.  Respiratory: Improved cough and dyspnea on exertion. Negative for hemoptysis  and wheezing.   Cardiovascular: Negative for chest pain and leg swelling.  Gastrointestinal: Negative for abdominal pain, constipation, diarrhea, nausea and vomiting.  Genitourinary: Negative for bladder incontinence, difficulty urinating, dysuria,  frequency and hematuria.   Musculoskeletal: Negative for back pain, gait problem, neck pain and neck stiffness.  Skin: Negative for itching and rash.  Neurological: Negative for dizziness, extremity weakness, gait problem, headaches, light-headedness and seizures.  Hematological: Negative for adenopathy. Does not bruise/bleed easily.  Psychiatric/Behavioral: Negative for confusion, depression and sleep disturbance. The patient is not nervous/anxious.     PHYSICAL EXAMINATION:  Blood pressure (!) 145/82, pulse 71, temperature (!) 97.2 F (36.2 C), temperature source Temporal, resp. rate 13, weight 167 lb 11.2 oz (76.1 kg), SpO2 100%.  ECOG PERFORMANCE STATUS: 1  Physical Exam  Constitutional: Oriented to person, place, and time and well-developed, well-nourished, and in no distress. HENT:  Head: Normocephalic and atraumatic.  Mouth/Throat: Oropharynx is clear and moist. No oropharyngeal exudate.  Eyes: Conjunctivae are normal. Right eye exhibits no discharge. Left eye exhibits no discharge. No scleral icterus.  Neck: Normal range of motion. Neck supple.  Cardiovascular: Normal  rate, regular rhythm, normal heart sounds and intact distal pulses.   Pulmonary/Chest: Effort normal and breath sounds normal. No respiratory distress. No wheezes. No rales.  Abdominal: Soft. Bowel sounds are normal. Exhibits no distension and no mass. There is no tenderness.  Musculoskeletal: Normal range of motion. Exhibits no edema.  Lymphadenopathy:    Positive for left supraclavicular adenopathy  Neurological: Alert and oriented to person, place, and time. Exhibits normal muscle tone. Gait normal. Coordination normal.  Skin: Skin is warm and dry. No rash noted. Not diaphoretic. No erythema. No pallor.  Psychiatric: Mood, memory and judgment normal.  Vitals reviewed.  LABORATORY DATA: Lab Results  Component Value Date   WBC 5.9 03/13/2024   HGB 14.1 03/13/2024   HCT 41.1 03/13/2024   MCV 86.7 03/13/2024    PLT 249 03/13/2024      Chemistry      Component Value Date/Time   NA 140 03/07/2024 1600   K 4.7 03/07/2024 1600   CL 103 03/07/2024 1600   CO2 20 03/07/2024 1600   BUN 7 (L) 03/07/2024 1600   CREATININE 0.81 03/07/2024 1600   CREATININE 0.78 02/13/2024 1417      Component Value Date/Time   CALCIUM 10.2 03/07/2024 1600   ALKPHOS 80 02/13/2024 1417   AST 31 02/13/2024 1417   ALT 26 02/13/2024 1417   BILITOT 0.5 02/13/2024 1417       RADIOGRAPHIC STUDIES:  No results found.   ASSESSMENT/PLAN:  This is a very pleasant 83 year old African-American female diagnosed with Stage IIIC (T3, N3, M0) non-small cell lung cancer, adenocarcinoma presented with supraclavicular lymphadenopathy, 2 left lung nodules diagnosed in September 2024.  There is also a short segment of wall thickening involving the splenic flexure of the colon with associated mild hypermetabolism which is nonspecific and colonoscopy was recommended.  Molecular studies showed positive KRAS G12C mutation and PD-L1 expression of 5%. The patient completed a course of concurrent chemoradiation with weekly carboplatin for AUC of 2 and paclitaxel 45 Mg/M2 started on September 20, 2023.  She is status post 6 week of treatment.  Last day chemotherapy was on 10/24/23.    She is currently on consolidation immunotherapy with Imfinzi 1500 mg IV every 4 weeks.  She is status post 3 cycles.  Cycle #3 was delayed by a few weeks due to flu.   Labs were reviewed. Recommend she proceed with cycle #4 today as scheduled.    We will see her back for labs and a follow-up visit in 4 weeks before undergoing cycle #5. I let her know we likely will order another CT after cycle #5 (every 3 treatments)   We also discussed prilosec for reflux. However, her symptoms are mild and she does not feel like she needs a medication to take daily. She will continue pepcid on an as needed basis. We also discussed lifestyle modifications such as sitting  upright after she eats.   The patient was advised to call immediately if she has any concerning symptoms in the interval. The patient voices understanding of current disease status and treatment options and is in agreement with the current care plan. All questions were answered. The patient knows to call the clinic with any problems, questions or concerns. We can certainly see the patient much sooner if necessary    No orders of the defined types were placed in this encounter.    The total time spent in the appointment was 20-29 minutes  Liani Caris L Billal Rollo, PA-C 03/13/24

## 2024-03-12 DIAGNOSIS — E7800 Pure hypercholesterolemia, unspecified: Secondary | ICD-10-CM | POA: Diagnosis not present

## 2024-03-12 DIAGNOSIS — I2541 Coronary artery aneurysm: Secondary | ICD-10-CM | POA: Diagnosis not present

## 2024-03-12 DIAGNOSIS — I471 Supraventricular tachycardia, unspecified: Secondary | ICD-10-CM | POA: Diagnosis not present

## 2024-03-12 DIAGNOSIS — Z0000 Encounter for general adult medical examination without abnormal findings: Secondary | ICD-10-CM | POA: Diagnosis not present

## 2024-03-12 DIAGNOSIS — K760 Fatty (change of) liver, not elsewhere classified: Secondary | ICD-10-CM | POA: Diagnosis not present

## 2024-03-12 DIAGNOSIS — E559 Vitamin D deficiency, unspecified: Secondary | ICD-10-CM | POA: Diagnosis not present

## 2024-03-12 DIAGNOSIS — I2510 Atherosclerotic heart disease of native coronary artery without angina pectoris: Secondary | ICD-10-CM | POA: Diagnosis not present

## 2024-03-12 DIAGNOSIS — R7303 Prediabetes: Secondary | ICD-10-CM | POA: Diagnosis not present

## 2024-03-12 DIAGNOSIS — C3492 Malignant neoplasm of unspecified part of left bronchus or lung: Secondary | ICD-10-CM | POA: Diagnosis not present

## 2024-03-12 DIAGNOSIS — K2100 Gastro-esophageal reflux disease with esophagitis, without bleeding: Secondary | ICD-10-CM | POA: Diagnosis not present

## 2024-03-12 DIAGNOSIS — I10 Essential (primary) hypertension: Secondary | ICD-10-CM | POA: Diagnosis not present

## 2024-03-12 DIAGNOSIS — R21 Rash and other nonspecific skin eruption: Secondary | ICD-10-CM | POA: Diagnosis not present

## 2024-03-13 ENCOUNTER — Inpatient Hospital Stay: Attending: Physician Assistant

## 2024-03-13 ENCOUNTER — Inpatient Hospital Stay

## 2024-03-13 ENCOUNTER — Inpatient Hospital Stay (HOSPITAL_BASED_OUTPATIENT_CLINIC_OR_DEPARTMENT_OTHER): Admitting: Physician Assistant

## 2024-03-13 VITALS — BP 145/82 | HR 71 | Temp 97.2°F | Resp 13 | Wt 167.7 lb

## 2024-03-13 DIAGNOSIS — Z9049 Acquired absence of other specified parts of digestive tract: Secondary | ICD-10-CM | POA: Insufficient documentation

## 2024-03-13 DIAGNOSIS — R0602 Shortness of breath: Secondary | ICD-10-CM | POA: Insufficient documentation

## 2024-03-13 DIAGNOSIS — Z90710 Acquired absence of both cervix and uterus: Secondary | ICD-10-CM | POA: Insufficient documentation

## 2024-03-13 DIAGNOSIS — Z79899 Other long term (current) drug therapy: Secondary | ICD-10-CM | POA: Insufficient documentation

## 2024-03-13 DIAGNOSIS — Z923 Personal history of irradiation: Secondary | ICD-10-CM | POA: Diagnosis not present

## 2024-03-13 DIAGNOSIS — I10 Essential (primary) hypertension: Secondary | ICD-10-CM | POA: Diagnosis not present

## 2024-03-13 DIAGNOSIS — E7800 Pure hypercholesterolemia, unspecified: Secondary | ICD-10-CM | POA: Diagnosis not present

## 2024-03-13 DIAGNOSIS — R059 Cough, unspecified: Secondary | ICD-10-CM | POA: Diagnosis not present

## 2024-03-13 DIAGNOSIS — I252 Old myocardial infarction: Secondary | ICD-10-CM | POA: Diagnosis not present

## 2024-03-13 DIAGNOSIS — Z5112 Encounter for antineoplastic immunotherapy: Secondary | ICD-10-CM

## 2024-03-13 DIAGNOSIS — C3412 Malignant neoplasm of upper lobe, left bronchus or lung: Secondary | ICD-10-CM

## 2024-03-13 DIAGNOSIS — Z79620 Long term (current) use of immunosuppressive biologic: Secondary | ICD-10-CM | POA: Diagnosis not present

## 2024-03-13 LAB — CMP (CANCER CENTER ONLY)
ALT: 19 U/L (ref 0–44)
AST: 23 U/L (ref 15–41)
Albumin: 3.8 g/dL (ref 3.5–5.0)
Alkaline Phosphatase: 76 U/L (ref 38–126)
Anion gap: 8 (ref 5–15)
BUN: 20 mg/dL (ref 8–23)
CO2: 25 mmol/L (ref 22–32)
Calcium: 9.4 mg/dL (ref 8.9–10.3)
Chloride: 107 mmol/L (ref 98–111)
Creatinine: 0.92 mg/dL (ref 0.44–1.00)
GFR, Estimated: >60 mL/min (ref >60)
Glucose, Bld: 114 mg/dL — ABNORMAL HIGH (ref 70–99)
Potassium: 3.6 mmol/L (ref 3.5–5.1)
Sodium: 140 mmol/L (ref 135–145)
Total Bilirubin: 0.4 mg/dL (ref 0.0–1.2)
Total Protein: 6.6 g/dL (ref 6.5–8.1)

## 2024-03-13 LAB — CBC WITH DIFFERENTIAL (CANCER CENTER ONLY)
Abs Immature Granulocytes: 0.19 10*3/uL — ABNORMAL HIGH (ref 0.00–0.07)
Basophils Absolute: 0 10*3/uL (ref 0.0–0.1)
Basophils Relative: 1 %
Eosinophils Absolute: 0.1 10*3/uL (ref 0.0–0.5)
Eosinophils Relative: 1 %
HCT: 41.1 % (ref 36.0–46.0)
Hemoglobin: 14.1 g/dL (ref 12.0–15.0)
Immature Granulocytes: 3 %
Lymphocytes Relative: 14 %
Lymphs Abs: 0.8 10*3/uL (ref 0.7–4.0)
MCH: 29.7 pg (ref 26.0–34.0)
MCHC: 34.3 g/dL (ref 30.0–36.0)
MCV: 86.7 fL (ref 80.0–100.0)
Monocytes Absolute: 0.7 10*3/uL (ref 0.1–1.0)
Monocytes Relative: 12 %
Neutro Abs: 4.1 10*3/uL (ref 1.7–7.7)
Neutrophils Relative %: 69 %
Platelet Count: 249 10*3/uL (ref 150–400)
RBC: 4.74 MIL/uL (ref 3.87–5.11)
RDW: 13.5 % (ref 11.5–15.5)
WBC Count: 5.9 10*3/uL (ref 4.0–10.5)
nRBC: 0 % (ref 0.0–0.2)

## 2024-03-13 MED ORDER — SODIUM CHLORIDE 0.9 % IV SOLN
INTRAVENOUS | Status: DC
Start: 2024-03-13 — End: 2024-03-13

## 2024-03-13 MED ORDER — SODIUM CHLORIDE 0.9 % IV SOLN
1500.0000 mg | Freq: Once | INTRAVENOUS | Status: AC
  Administered 2024-03-13: 1500 mg via INTRAVENOUS
  Filled 2024-03-13: qty 30

## 2024-03-20 ENCOUNTER — Telehealth (HOSPITAL_COMMUNITY): Payer: Self-pay | Admitting: *Deleted

## 2024-03-20 NOTE — Telephone Encounter (Signed)
 Attempted to call patient regarding upcoming cardiac CT appointment. Left message on voicemail with name and callback number  Larey Brick RN Navigator Cardiac Imaging [facility] [phone] Office [phone] Cell

## 2024-03-21 ENCOUNTER — Ambulatory Visit (HOSPITAL_COMMUNITY)
Admission: RE | Admit: 2024-03-21 | Discharge: 2024-03-21 | Disposition: A | Discharge status: Home / Self Care | Source: Ambulatory Visit | Attending: Internal Medicine | Admitting: Internal Medicine

## 2024-03-21 DIAGNOSIS — I2541 Coronary artery aneurysm: Secondary | ICD-10-CM | POA: Insufficient documentation

## 2024-03-21 MED ORDER — METOPROLOL TARTRATE 5 MG/5ML IV SOLN
10.0000 mg | Freq: Once | INTRAVENOUS | Status: AC | PRN
  Administered 2024-03-21: 10 mg via INTRAVENOUS

## 2024-03-21 MED ORDER — IOHEXOL 350 MG/ML SOLN
100.0000 mL | Freq: Once | INTRAVENOUS | Status: AC | PRN
  Administered 2024-03-21: 100 mL via INTRAVENOUS

## 2024-03-21 MED ORDER — NITROGLYCERIN 0.4 MG SL SUBL
0.8000 mg | SUBLINGUAL_TABLET | Freq: Once | SUBLINGUAL | Status: AC
  Administered 2024-03-21: 0.8 mg via SUBLINGUAL

## 2024-03-21 MED ORDER — METOPROLOL TARTRATE 5 MG/5ML IV SOLN
INTRAVENOUS | Status: AC
  Filled 2024-03-21: qty 10

## 2024-03-21 MED ORDER — NITROGLYCERIN 0.4 MG SL SUBL
SUBLINGUAL_TABLET | SUBLINGUAL | Status: AC
  Filled 2024-03-21: qty 2

## 2024-03-21 MED ORDER — DILTIAZEM HCL 25 MG/5ML IV SOLN: 10.0000 mg | INTRAVENOUS | Status: DC | PRN

## 2024-03-26 ENCOUNTER — Other Ambulatory Visit: Payer: Medicare Other

## 2024-03-26 ENCOUNTER — Ambulatory Visit: Payer: Medicare Other

## 2024-03-26 ENCOUNTER — Ambulatory Visit: Payer: Medicare Other | Admitting: Internal Medicine

## 2024-03-29 ENCOUNTER — Encounter: Payer: Self-pay | Admitting: Internal Medicine

## 2024-04-02 ENCOUNTER — Ambulatory Visit: Payer: Medicare Other | Admitting: Internal Medicine

## 2024-04-02 ENCOUNTER — Ambulatory Visit: Admitting: Nurse Practitioner

## 2024-04-02 ENCOUNTER — Other Ambulatory Visit: Payer: Medicare Other

## 2024-04-02 ENCOUNTER — Ambulatory Visit: Payer: Medicare Other

## 2024-04-04 NOTE — Progress Notes (Signed)
 [facility] OFFICE PROGRESS NOTE  Stephanie Bradley, MD [address] [place] [address]  DIAGNOSIS: Stage IIIC (T3, N3, M0) non-small cell lung cancer, adenocarcinoma presented with supraclavicular lymphadenopathy, 2 left lung nodules diagnosed in September 2024.  There is also a short segment of wall thickening involving the splenic flexure of the colon with associated mild hypermetabolism which is nonspecific and colonoscopy was recommended.    Biomarker Findings HRD signature - HRDsig Negative Microsatellite status - MS-Equivocal ? Tumor Mutational Burden - 2 Muts/Mb Genomic Findings For a complete list of the genes assayed, please refer to the Appendix. KRAS G12C TP53 R110L 7 Disease relevant genes with no reportable alterations: ALK, BRAF, EGFR, ERBB2, MET, RET, ROS1   PDL1 Expression 5%.  PRIOR THERAPY: Concurrent chemoradiation with carboplatin  for an AUC of 2 and taxol  45 mg/m2. Last dose on 10/24/23. Status post 6 cycles.   CURRENT THERAPY: Consolidation treatment with Imfinzi  1500 Mg IV every 4 weeks status post 4 cycles.   INTERVAL HISTORY: Stephanie Newton 83 y.o. female returns to the clinic today for a follow-up visit. The patient was last seen on 03/13/24. She resumed her treatment with immunotherapy which she tolerated well.   She has had a lingering intermittent cough and hoarseness since early march/late February. This is stable. She  uses over-the-counter medications like Robitussin and Alka-Seltzer, though she is unsure of their effectiveness since her symptoms do improve but she is not sure if related to the medication vs the spell resolving on its own. No fever, chills, or hemoptysis. She experiences shortness of breath but notes it has not worsened.  She has mild hoarseness in her voice. No pain or difficulty swallowing, and only occasionally feels the need to clear her throat, which does not provide much relief. She  experiences some postnasal drip but finds it tolerable and only takes allergy medications like Claritin or Zyrtec  when symptoms worsen.  She mentions a mild rash on her lower leg, which reappeared after a round of prednisone . The rash is localized to her lower leg, and she has not used hydrocortisone cream during this episode. Previously, the rash had appeared on her thighs and arms.  She experiences fatigue and a lack of energy, which limits her ability to engage in activities she wants to do. She has considered using energy drinks like Red Bull but is concerned about the potential impact on her heart, especially given her history of palpitations and current medication use for this condition.   She reports that an aneurysm has increased slightly in size but not enough to warrant intervention. She is monitored more frequently than typical guidelines due to her medical history. She notes a spot on her neck that appears to be getting lighter and less swollen.   She is here today for evaluation and reevaluation prior to starting cycle #5.    MEDICAL HISTORY: Past Medical History:  Diagnosis Date   Arthritis    Dysrhythmia    Hypercholesteremia    Hypertension    Myocardial infarction [facility])     ALLERGIES:  is allergic to shellfish allergy.  MEDICATIONS:  Current Outpatient Medications  Medication Sig Dispense Refill   amLODipine (NORVASC) 2.5 MG tablet Take 2.5 mg by mouth daily.     amoxicillin -clavulanate (AUGMENTIN ) 875-125 MG tablet Take 1 tablet by mouth every 12 (twelve) hours. 14 tablet 0   aspirin  EC 81 MG tablet Take 1 tablet by mouth daily.     doxycycline  (  VIBRAMYCIN ) 100 MG capsule Take 1 capsule (100 mg total) by mouth 2 (two) times daily. 20 capsule 0   estradiol  (ESTRACE ) 1 MG tablet Take 1 mg by mouth daily.     famotidine  (PEPCID ) 20 MG tablet Take 20 mg by mouth as needed.     hydrochlorothiazide  (HYDRODIURIL ) 12.5 MG tablet Take 12.5 mg by mouth daily.       methocarbamol  (ROBAXIN ) 500 MG tablet Take 1 tablet (500 mg total) by mouth every 6 (six) hours as needed for muscle spasms. 40 tablet 0   methylPREDNISolone  (MEDROL  DOSEPAK) 4 MG TBPK tablet Use as instructed. 21 tablet 0   metoprolol  succinate (TOPROL -XL) 100 MG 24 hr tablet Take 100 mg by mouth daily.     metoprolol  succinate (TOPROL -XL) 50 MG 24 hr tablet TAKE 1 TABLET BY MOUTH DAILY 90 tablet 0   metoprolol  tartrate (LOPRESSOR ) 100 MG tablet Take 1 tablet (100 mg total) by mouth once for 1 dose. Take 90-120 minutes prior to scan. Hold for SBP less than 110. 1 tablet 0   oxyCODONE -acetaminophen  (PERCOCET/ROXICET) 5-325 MG tablet Take 1-2 tablets by mouth every 8 (eight) hours as needed for severe pain. 6 tablet 0   prochlorperazine  (COMPAZINE ) 10 MG tablet Take 1 tablet (10 mg total) by mouth every 6 (six) hours as needed. 30 tablet 2   simvastatin  (ZOCOR ) 20 MG tablet Take 20 mg by mouth at bedtime.     sucralfate  (CARAFATE ) 1 g tablet Take 1 tablet (1 g total) by mouth 4 (four) times daily -  with meals and at bedtime. 120 tablet 0   No current facility-administered medications for this visit.    SURGICAL HISTORY:  Past Surgical History:  Procedure Laterality Date   BREAST EXCISIONAL BIOPSY Right    x2   BRONCHIAL BIOPSY  08/30/2023   Procedure: BRONCHIAL BIOPSIES;  Surgeon: Prudy Brownie, DO;  Location: [facility];  Service: Pulmonary;;   EYE SURGERY     cataract bilateral    HERNIA REPAIR  06/2000   INCISIONAL VENTRAL ; DR Salena Craven    KNEE ARTHROSCOPY Left 08/23/2017   dr Hazeline Lister    LAPAROSCOPIC CHOLECYSTECTOMY  06/13/2000   DR DOUG [facility]    LEFT HEART CATH AND CORONARY ANGIOGRAPHY N/A 12/04/2018   Procedure: LEFT HEART CATH AND CORONARY ANGIOGRAPHY;  Surgeon: Arty Binning, MD;  Location: [facility] INVASIVE CV LAB;  Service: Cardiovascular;  Laterality: N/A;   TOTAL KNEE ARTHROPLASTY Left 03/31/2018   Procedure: LEFT TOTAL KNEE ARTHROPLASTY;  Surgeon: Genevie Kerns,  MD;  Location: [facility] ORS;  Service: Orthopedics;  Laterality: Left;  with block   TOTAL KNEE ARTHROPLASTY Right 04/30/2020   Procedure: RIGHT TOTAL KNEE ARTHROPLASTY;  Surgeon: Liliane Rei, MD;  Location: [facility] OR[facility];  Service: Orthopedics;  Laterality: Right;    VAGINAL HYSTERECTOMY  age 43s    REVIEW OF SYSTEMS:   Constitutional: Stable fatigue. Negative for appetite change, chills, fever and unexpected weight change.  HENT: Positive for some post nasal drainage and mild hoarseness. Negative for mouth sores, nosebleeds, sore throat and trouble swallowing.   Eyes: Negative for eye problems and icterus.  Respiratory: Stable intermittent cough and stable dyspnea on exertion. Negative for hemoptysis  wheezing.   Cardiovascular: Negative for chest pain and leg swelling.  Gastrointestinal: Negative for abdominal pain, constipation, diarrhea, nausea and vomiting.  Genitourinary: Negative for bladder incontinence, difficulty urinating, dysuria, frequency and hematuria.   Musculoskeletal: Negative for back pain, gait problem, neck pain and neck stiffness.  Skin: Negative for itching and rash.  Neurological: Negative for dizziness, extremity weakness, gait problem, headaches, light-headedness and seizures.  Hematological: Negative for adenopathy. Does not bruise/bleed easily.  Psychiatric/Behavioral: Negative for confusion, depression and sleep disturbance. The patient is not nervous/anxious.       PHYSICAL EXAMINATION:  There were no vitals taken for this visit.  ECOG PERFORMANCE STATUS: 1  Physical Exam  Constitutional: Oriented to person, place, and time and well-developed, well-nourished, and in no distress. HENT:  Head: Normocephalic and atraumatic.  Mouth/Throat: Oropharynx is clear and moist. No oropharyngeal exudate.  Eyes: Conjunctivae are normal. Right eye exhibits no discharge. Left eye exhibits no discharge. No scleral icterus.  Neck: Normal range of motion. Neck supple.   Cardiovascular: Normal rate, regular rhythm, normal heart sounds and intact distal pulses.   Pulmonary/Chest: Effort normal and breath sounds normal. No respiratory distress. No wheezes. No rales.  Abdominal: Soft. Bowel sounds are normal. Exhibits no distension and no mass. There is no tenderness.  Musculoskeletal: Normal range of motion. Exhibits no edema.  Lymphadenopathy:    Positive for left supraclavicular adenopathy  Neurological: Alert and oriented to person, place, and time. Exhibits normal muscle tone. Gait normal. Coordination normal.  Skin: Skin is warm and dry. No rash noted. Not diaphoretic. No erythema. No pallor.  Psychiatric: Mood, memory and judgment normal.  Vitals reviewed.  LABORATORY DATA: Lab Results  Component Value Date   WBC 5.9 03/13/2024   HGB 14.1 03/13/2024   HCT 41.1 03/13/2024   MCV 86.7 03/13/2024   PLT 249 03/13/2024      Chemistry      Component Value Date/Time   NA 140 03/13/2024 0751   NA 140 03/07/2024 1600   K 3.6 03/13/2024 0751   CL 107 03/13/2024 0751   CO2 25 03/13/2024 0751   BUN 20 03/13/2024 0751   BUN 7 (L) 03/07/2024 1600   CREATININE 0.92 03/13/2024 0751      Component Value Date/Time   CALCIUM  9.4 03/13/2024 0751   ALKPHOS 76 03/13/2024 0751   AST 23 03/13/2024 0751   ALT 19 03/13/2024 0751   BILITOT 0.4 03/13/2024 0751       RADIOGRAPHIC STUDIES:  CT CORONARY MORPH W/CTA COR W/SCORE W/CA W/CM &/OR WO/CM Result Date: 03/29/2024 HISTORY: Left Main aneurysm EXAM: Cardiac/Coronary  CT TECHNIQUE: The patient was scanned on a [employer] scanner. PROTOCOL: A 120 kV prospective scan was triggered in the descending thoracic aorta at 111 HU's. Axial non-contrast 3 mm slices were carried out through the heart. The data set was analyzed on a dedicated work station and scored using the Agatston method. Gantry rotation speed was 250 msecs and collimation was .6 mm. Beta blockade and 0.8 mg of sl NTG was given. The 3D data set was  reconstructed in 5% intervals of the 30-75 % of the R-R cycle. Systolic and diastolic phases were analyzed on a dedicated work station using MPR, MIP and VRT modes. The patient received contrast: 100mL OMNIPAQUE  IOHEXOL  350 MG/ML SOLN. FINDINGS: Image quality: Good Artifact: Limited Coronary calcium  score is 0. Coronary arteries: Normal coronary origins.  Right dominance. Left main: Originates from left coronary cusp and trifurcates into the LAD, Ramus, and LCX arteries. Known left main saccular aneurysm measured (13.4 x 10.5 x 11.6 mm) but no evidence of plaque,stenosis, or thrombus in the left main. LAD: Normal caliber tortuous vessel, reaches the apex, gives rise to 2 diagonal branches. LAD is patent (no plaque or stenosis). 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 D1- Patent D2- Patent. Ramus: Normal caliber vessel, large in size, patent (no plaque or stenosis). Coronary cameral fistula with the left ventricle. LCX: Normal caliber vessel, non dominant, travels within the atrioventricular groove, gives rise to 2 obtuse marginal branches. LCX is patent (no plaque or stenosis). Coronary cameral fistula with the left ventricle. OM1- Patent OM2- Patent RCA: Normal caliber vessel, dominant, bifurcates into the PDA and PLV branches. RCA is patent. Aorta: Normal size, 28.7 mm at the mid ascending aorta (level of the PA bifurcation) measured double oblique. Aortic Valve: Native valve, trileaflet aortic valve, no significant calcification. Mitral valve: Native valve, no significant mitral annular calcification. Other findings: Normal pulmonary vein drainage into the left atrium. Normal left atrial appendage without thrombus. Normal size of the pulmonary artery. Patent foramen ovale. Please see separate report from [facility] for non-cardiac findings. IMPRESSION: 1. Coronary calcium  score of 0. 2. Normal coronary origins with right dominance. 3. Left main saccular aneurysm again demonstrated, 13.4 X 10.5  X 11.6 mm. Accounting for inter-reader variability, this may represent slight increase in measurements. No plaque, stenosis, or thrombus in the left main. Prior study dated 10/26/2022 reported the following dimensions for the left main saccular aneurysm (13 x 10 x 10 mm). 4. CAD-RADS 0 No evidence of CAD. 5. Patent foramen ovale. RECOMMENDATIONS: Consider non-atherosclerotic causes of chest pain. Consider multidisciplinary/heart team approach to review her coronary anatomy given her left main saccular aneurysm, symptoms, and longitudinal follow-up/management. Electronically Signed   By: Olinda Bertrand   On: 03/29/2024 12:03     ASSESSMENT/PLAN:  This is a very pleasant 83 year old African-American female diagnosed with Stage IIIC (T3, N3, M0) non-small cell lung cancer, adenocarcinoma presented with supraclavicular lymphadenopathy, 2 left lung nodules diagnosed in September 2024.  There is also a short segment of wall thickening involving the splenic flexure of the colon with associated mild hypermetabolism which is nonspecific and colonoscopy was recommended.  Molecular studies showed positive KRAS G12C mutation and PD-L1 expression of 5%. The patient completed a course of concurrent chemoradiation with weekly carboplatin  for AUC of 2 and paclitaxel  45 Mg/M2 started on September 20, 2023.  She is status post 6 week of treatment.  Last day chemotherapy was on 10/24/23.    She is currently on consolidation immunotherapy with Imfinzi  1500 mg IV every 4 weeks.  She is status post 4 cycles.  Cycle #3 was delayed by a few weeks due to flu.   Labs were reviewed. Recommend she proceed with cycle #5 today as scheduled.    We will see her back for labs and a follow-up visit in 4 weeks before undergoing cycle #6. I let her know we will order a CT after cycle #5. Therefore, I will arrange for a restaging CT scan prior to her next cycle of treatment which we will review with her when we see her for cycle #6. I will  include CT of the neck on scan due to her known lymphadenopathy and hoarseness. Her hoarseness may be post nasal drip which she will continue with warm liquids. Can consider ENT referral if worsening symptoms.   Advised against Red Bull.   Skin rash Recurrent rash on lower legs, possibly environmental. The rash is localized and small on her ankle. - Advise use of hydrocortisone cream.   The patient was advised to call immediately if she has any concerning symptoms in the interval. The patient voices understanding of current disease status and treatment options  and is in agreement with the current care plan. All questions were answered. The patient knows to call the clinic with any problems, questions or concerns. We can certainly see the patient much sooner if necessary   No orders of the defined types were placed in this encounter.   The total time spent in the appointment was 20-29 minutes  Laurinda Carreno L Chosen Garron, PA-C 04/04/24

## 2024-04-10 ENCOUNTER — Inpatient Hospital Stay

## 2024-04-10 ENCOUNTER — Inpatient Hospital Stay: Attending: Physician Assistant

## 2024-04-10 ENCOUNTER — Inpatient Hospital Stay (HOSPITAL_BASED_OUTPATIENT_CLINIC_OR_DEPARTMENT_OTHER): Admitting: Physician Assistant

## 2024-04-10 VITALS — BP 139/73 | HR 73 | Temp 97.3°F | Resp 15 | Wt 172.9 lb

## 2024-04-10 DIAGNOSIS — R0982 Postnasal drip: Secondary | ICD-10-CM | POA: Insufficient documentation

## 2024-04-10 DIAGNOSIS — Z5112 Encounter for antineoplastic immunotherapy: Secondary | ICD-10-CM | POA: Diagnosis not present

## 2024-04-10 DIAGNOSIS — R590 Localized enlarged lymph nodes: Secondary | ICD-10-CM | POA: Diagnosis not present

## 2024-04-10 DIAGNOSIS — I252 Old myocardial infarction: Secondary | ICD-10-CM | POA: Diagnosis not present

## 2024-04-10 DIAGNOSIS — Z7982 Long term (current) use of aspirin: Secondary | ICD-10-CM | POA: Diagnosis not present

## 2024-04-10 DIAGNOSIS — Z923 Personal history of irradiation: Secondary | ICD-10-CM | POA: Insufficient documentation

## 2024-04-10 DIAGNOSIS — Z90710 Acquired absence of both cervix and uterus: Secondary | ICD-10-CM | POA: Insufficient documentation

## 2024-04-10 DIAGNOSIS — R490 Dysphonia: Secondary | ICD-10-CM | POA: Diagnosis not present

## 2024-04-10 DIAGNOSIS — Z9049 Acquired absence of other specified parts of digestive tract: Secondary | ICD-10-CM | POA: Insufficient documentation

## 2024-04-10 DIAGNOSIS — C3412 Malignant neoplasm of upper lobe, left bronchus or lung: Secondary | ICD-10-CM | POA: Insufficient documentation

## 2024-04-10 DIAGNOSIS — Z79899 Other long term (current) drug therapy: Secondary | ICD-10-CM | POA: Diagnosis not present

## 2024-04-10 DIAGNOSIS — Q2112 Patent foramen ovale: Secondary | ICD-10-CM | POA: Diagnosis not present

## 2024-04-10 DIAGNOSIS — Z9221 Personal history of antineoplastic chemotherapy: Secondary | ICD-10-CM | POA: Diagnosis not present

## 2024-04-10 DIAGNOSIS — R5383 Other fatigue: Secondary | ICD-10-CM | POA: Insufficient documentation

## 2024-04-10 DIAGNOSIS — R21 Rash and other nonspecific skin eruption: Secondary | ICD-10-CM | POA: Insufficient documentation

## 2024-04-10 DIAGNOSIS — Z79620 Long term (current) use of immunosuppressive biologic: Secondary | ICD-10-CM | POA: Diagnosis not present

## 2024-04-10 DIAGNOSIS — R0602 Shortness of breath: Secondary | ICD-10-CM | POA: Insufficient documentation

## 2024-04-10 LAB — CBC WITH DIFFERENTIAL (CANCER CENTER ONLY)
Abs Immature Granulocytes: 0.01 10*3/uL (ref 0.00–0.07)
Basophils Absolute: 0 10*3/uL (ref 0.0–0.1)
Basophils Relative: 1 %
Eosinophils Absolute: 0.1 10*3/uL (ref 0.0–0.5)
Eosinophils Relative: 4 %
HCT: 38 % (ref 36.0–46.0)
Hemoglobin: 13.1 g/dL (ref 12.0–15.0)
Immature Granulocytes: 0 %
Lymphocytes Relative: 16 %
Lymphs Abs: 0.6 10*3/uL — ABNORMAL LOW (ref 0.7–4.0)
MCH: 30.1 pg (ref 26.0–34.0)
MCHC: 34.5 g/dL (ref 30.0–36.0)
MCV: 87.4 fL (ref 80.0–100.0)
Monocytes Absolute: 0.5 10*3/uL (ref 0.1–1.0)
Monocytes Relative: 16 %
Neutro Abs: 2.2 10*3/uL (ref 1.7–7.7)
Neutrophils Relative %: 63 %
Platelet Count: 224 10*3/uL (ref 150–400)
RBC: 4.35 MIL/uL (ref 3.87–5.11)
RDW: 13.8 % (ref 11.5–15.5)
WBC Count: 3.4 10*3/uL — ABNORMAL LOW (ref 4.0–10.5)
nRBC: 0 % (ref 0.0–0.2)

## 2024-04-10 LAB — CMP (CANCER CENTER ONLY)
ALT: 14 U/L (ref 0–44)
AST: 25 U/L (ref 15–41)
Albumin: 3.8 g/dL (ref 3.5–5.0)
Alkaline Phosphatase: 79 U/L (ref 38–126)
Anion gap: 6 (ref 5–15)
BUN: 11 mg/dL (ref 8–23)
CO2: 25 mmol/L (ref 22–32)
Calcium: 9.6 mg/dL (ref 8.9–10.3)
Chloride: 109 mmol/L (ref 98–111)
Creatinine: 0.73 mg/dL (ref 0.44–1.00)
GFR, Estimated: >60 mL/min (ref >60)
Glucose, Bld: 97 mg/dL (ref 70–99)
Potassium: 3.6 mmol/L (ref 3.5–5.1)
Sodium: 140 mmol/L (ref 135–145)
Total Bilirubin: 0.5 mg/dL (ref 0.0–1.2)
Total Protein: 6.6 g/dL (ref 6.5–8.1)

## 2024-04-10 MED ORDER — SODIUM CHLORIDE 0.9 % IV SOLN
1500.0000 mg | Freq: Once | INTRAVENOUS | Status: AC
  Administered 2024-04-10: 1500 mg via INTRAVENOUS
  Filled 2024-04-10: qty 30

## 2024-04-10 MED ORDER — SODIUM CHLORIDE 0.9 % IV SOLN: INTRAVENOUS | Status: DC

## 2024-04-10 NOTE — Patient Instructions (Signed)

## 2024-05-01 ENCOUNTER — Ambulatory Visit (HOSPITAL_COMMUNITY)
Admission: RE | Admit: 2024-05-01 | Discharge: 2024-05-01 | Disposition: A | Discharge status: Home / Self Care | Source: Ambulatory Visit | Attending: Physician Assistant | Admitting: Physician Assistant

## 2024-05-01 DIAGNOSIS — C3412 Malignant neoplasm of upper lobe, left bronchus or lung: Secondary | ICD-10-CM | POA: Diagnosis not present

## 2024-05-01 DIAGNOSIS — C770 Secondary and unspecified malignant neoplasm of lymph nodes of head, face and neck: Secondary | ICD-10-CM | POA: Diagnosis not present

## 2024-05-01 MED ORDER — IOHEXOL 300 MG/ML  SOLN
75.0000 mL | Freq: Once | INTRAMUSCULAR | Status: AC | PRN
  Administered 2024-05-01: 75 mL via INTRAVENOUS

## 2024-05-07 NOTE — Progress Notes (Signed)
 [facility] OFFICE PROGRESS NOTE  Stephanie Bradley, MD [address] [place] [address]  DIAGNOSIS: Stage IIIC (T3, N3, M0) non-small cell lung cancer, adenocarcinoma presented with supraclavicular lymphadenopathy, 2 left lung nodules diagnosed in September 2024.  There is also a short segment of wall thickening involving the splenic flexure of the colon with associated mild hypermetabolism which is nonspecific and colonoscopy was recommended.    Biomarker Findings HRD signature - HRDsig Negative Microsatellite status - MS-Equivocal ? Tumor Mutational Burden - 2 Muts/Mb Genomic Findings For a complete list of the genes assayed, please refer to the Appendix. KRAS G12C TP53 R110L 7 Disease relevant genes with no reportable alterations: ALK, BRAF, EGFR, ERBB2, MET, RET, ROS1   PDL1 Expression 5%.  PRIOR THERAPY: Concurrent chemoradiation with carboplatin  for an AUC of 2 and taxol  45 mg/m2. Last dose on 10/24/23. Status post 6 cycles.   CURRENT THERAPY: Consolidation treatment with Imfinzi  1500 Mg IV every 4 weeks status post 5 cycles.   INTERVAL HISTORY: JENET Newton 83 y.o. female returns to the clinic today for a follow-up visit. The patient was last seen on 04/10/24. She is currently on immunotherapy and tolerating it well. Her main complaint is persistent fatigue. She tries to be active at home with cleaning. She avoids walking in her backyard due to a fear of snakes but did walk to a pond earlier in the month, which she found exhausting.  She has had a lingering intermittent cough and hoarseness that comes and goes since early march/late February. This is stable. I ordered CT of the neck to evaluate her hoarseness which has not been read at this time. If this does not show etiology of her hoarseness, she is not interested in having direct visualization of the vocal cords with ENT.   She denies fever, chills, or hemoptysis. She experiences  shortness of breath but notes it has not worsened. She experiences a productive cough with yellowish-green sputum and occasional wheezing. She also has post-nasal drip and nasal drainage. She has not taken Claritin or Zyrtec  recently for her allergies. No nausea, vomiting, diarrhea, or constipation.   She has mild hoarseness in her voice. No pain or difficulty swallowing, and only occasionally feels the need to clear her throat, which does not provide much relief. She experiences some postnasal drip but finds it tolerable and only takes allergy medications like Claritin or Zyrtec  when symptoms worsen.   She notes a decrease in appetite, stating 'it just doesn't click in my brain' to eat, although the taste of food is fine. She has lost about two pounds since her last visit. She sometimes naps but not regularly.  She is here for evaluation and repeat blood work and to review her scan result before undergoing cycle #6.   MEDICAL HISTORY: Past Medical History:  Diagnosis Date   Arthritis    Dysrhythmia    Hypercholesteremia    Hypertension    Myocardial infarction [facility])     ALLERGIES:  is allergic to shellfish allergy.  MEDICATIONS:  Current Outpatient Medications  Medication Sig Dispense Refill   amLODipine (NORVASC) 2.5 MG tablet Take 2.5 mg by mouth daily.     amoxicillin -clavulanate (AUGMENTIN ) 875-125 MG tablet Take 1 tablet by mouth every 12 (twelve) hours. 14 tablet 0   aspirin  EC 81 MG tablet Take 1 tablet by mouth daily.     doxycycline  (VIBRAMYCIN ) 100 MG capsule Take 1 capsule (100 mg total) by mouth 2 (two)  times daily. 20 capsule 0   estradiol  (ESTRACE ) 1 MG tablet Take 1 mg by mouth daily.     famotidine  (PEPCID ) 20 MG tablet Take 20 mg by mouth as needed.     hydrochlorothiazide  (HYDRODIURIL ) 12.5 MG tablet Take 12.5 mg by mouth daily.      methocarbamol  (ROBAXIN ) 500 MG tablet Take 1 tablet (500 mg total) by mouth every 6 (six) hours as needed for muscle spasms. 40 tablet 0    methylPREDNISolone  (MEDROL  DOSEPAK) 4 MG TBPK tablet Use as instructed. 21 tablet 0   metoprolol  succinate (TOPROL -XL) 100 MG 24 hr tablet Take 100 mg by mouth daily.     metoprolol  succinate (TOPROL -XL) 50 MG 24 hr tablet TAKE 1 TABLET BY MOUTH DAILY 90 tablet 0   metoprolol  tartrate (LOPRESSOR ) 100 MG tablet Take 1 tablet (100 mg total) by mouth once for 1 dose. Take 90-120 minutes prior to scan. Hold for SBP less than 110. 1 tablet 0   oxyCODONE -acetaminophen  (PERCOCET/ROXICET) 5-325 MG tablet Take 1-2 tablets by mouth every 8 (eight) hours as needed for severe pain. 6 tablet 0   prochlorperazine  (COMPAZINE ) 10 MG tablet Take 1 tablet (10 mg total) by mouth every 6 (six) hours as needed. 30 tablet 2   simvastatin  (ZOCOR ) 20 MG tablet Take 20 mg by mouth at bedtime.     sucralfate  (CARAFATE ) 1 g tablet Take 1 tablet (1 g total) by mouth 4 (four) times daily -  with meals and at bedtime. 120 tablet 0   No current facility-administered medications for this visit.    SURGICAL HISTORY:  Past Surgical History:  Procedure Laterality Date   BREAST EXCISIONAL BIOPSY Right    x2   BRONCHIAL BIOPSY  08/30/2023   Procedure: BRONCHIAL BIOPSIES;  Surgeon: Prudy Brownie, DO;  Location: [facility];  Service: Pulmonary;;   EYE SURGERY     cataract bilateral    HERNIA REPAIR  06/2000   INCISIONAL VENTRAL ; DR Salena Craven    KNEE ARTHROSCOPY Left 08/23/2017   dr Hazeline Lister    LAPAROSCOPIC CHOLECYSTECTOMY  06/13/2000   DR DOUG [facility]    LEFT HEART CATH AND CORONARY ANGIOGRAPHY N/A 12/04/2018   Procedure: LEFT HEART CATH AND CORONARY ANGIOGRAPHY;  Surgeon: Arty Binning, MD;  Location: [facility];  Service: Cardiovascular;  Laterality: N/A;   TOTAL KNEE ARTHROPLASTY Left 03/31/2018   Procedure: LEFT TOTAL KNEE ARTHROPLASTY;  Surgeon: Genevie Kerns, MD;  Location: [facility];  Service: Orthopedics;  Laterality: Left;  with block   TOTAL KNEE ARTHROPLASTY Right 04/30/2020   Procedure:  RIGHT TOTAL KNEE ARTHROPLASTY;  Surgeon: Liliane Rei, MD;  Location: [facility];  Service: Orthopedics;  Laterality: Right;    VAGINAL HYSTERECTOMY  age 75s    REVIEW OF SYSTEMS:   Constitutional: Stable fatigue. Negative for appetite change, chills, fever and unexpected weight change.  HENT: Positive for some post nasal drainage and mild hoarseness. Negative for mouth sores, nosebleeds, sore throat and trouble swallowing.   Eyes: Negative for eye problems and icterus.  Respiratory: Stable intermittent cough and stable dyspnea on exertion. Negative for hemoptysis  wheezing.   Cardiovascular: Negative for chest pain and leg swelling.  Gastrointestinal: Negative for abdominal pain, constipation, diarrhea, nausea and vomiting.  Genitourinary: Negative for bladder incontinence, difficulty urinating, dysuria, frequency and hematuria.   Musculoskeletal: Negative for back pain, gait problem, neck pain and neck stiffness.  Skin: Negative for itching and rash.  Neurological: Negative for dizziness, extremity weakness,  gait problem, headaches, light-headedness and seizures.  Hematological: Negative for adenopathy. Does not bruise/bleed easily.  Psychiatric/Behavioral: Negative for confusion, depression and sleep disturbance. The patient is not nervous/anxious.       PHYSICAL EXAMINATION:  There were no vitals taken for this visit.  ECOG PERFORMANCE STATUS: 1  Physical Exam  Constitutional: Oriented to person, place, and time and well-developed, well-nourished, and in no distress. HENT:  Head: Normocephalic and atraumatic.  Mouth/Throat: Oropharynx is clear and moist. No oropharyngeal exudate.  Eyes: Conjunctivae are normal. Right eye exhibits no discharge. Left eye exhibits no discharge. No scleral icterus.  Neck: Normal range of motion. Neck supple.  Cardiovascular: Normal rate, regular rhythm, normal heart sounds and intact distal pulses.   Pulmonary/Chest: Effort normal and breath  sounds normal. No respiratory distress. No wheezes. No rales.  Abdominal: Soft. Bowel sounds are normal. Exhibits no distension and no mass. There is no tenderness.  Musculoskeletal: Normal range of motion. Exhibits no edema.  Lymphadenopathy:    Positive for left supraclavicular adenopathy  Neurological: Alert and oriented to person, place, and time. Exhibits normal muscle tone. Gait normal. Coordination normal.  Skin: Skin is warm and dry. No rash noted. Not diaphoretic. No erythema. No pallor.  Psychiatric: Mood, memory and judgment normal.  Vitals reviewed.    LABORATORY DATA: Lab Results  Component Value Date   WBC 3.4 (L) 04/10/2024   HGB 13.1 04/10/2024   HCT 38.0 04/10/2024   MCV 87.4 04/10/2024   PLT 224 04/10/2024      Chemistry      Component Value Date/Time   NA 140 04/10/2024 1036   NA 140 03/07/2024 1600   K 3.6 04/10/2024 1036   CL 109 04/10/2024 1036   CO2 25 04/10/2024 1036   BUN 11 04/10/2024 1036   BUN 7 (L) 03/07/2024 1600   CREATININE 0.73 04/10/2024 1036      Component Value Date/Time   CALCIUM  9.6 04/10/2024 1036   ALKPHOS 79 04/10/2024 1036   AST 25 04/10/2024 1036   ALT 14 04/10/2024 1036   BILITOT 0.5 04/10/2024 1036       RADIOGRAPHIC STUDIES:  CT Chest W Contrast Result Date: 05/07/2024 EXAM: CT CHEST WITH CONTRAST 05/01/2024 03:11:50 PM TECHNIQUE: CT of the chest was performed with the administration of intravenous contrast. Multiplanar reformatted images are provided for review. Automated exposure control, iterative reconstruction, and/or weight based adjustment of the mA/kV was utilized to reduce the radiation dose to as low as reasonably achievable. COMPARISON: CTA chest 02/05/2024 and CT chest dated 11/17/2023. CLINICAL HISTORY: Non-small cell lung cancer (NSCLC), non-metastatic, assess treatment response. Adenocarcinoma of upper lobe of left lung; NSCLC, non metastatic, assess treatment response; Omnipaque  300; 75 cc ; diagnosed with  Stage IIIC (T3, N3, M0) non-small cell lung cancer, adenocarcinoma presented with supraclavicular lymphadenopathy, 2 left lung nodules diagnosed in September 2024. There is also a short segment of wall thickening involving the splenic ; flexure of the colon with associated mild hypermetabolism which is nonspecific and colonoscopy was recommended. ; Molecular studies showed positive KRAS G12C mutation and PD-L1 expression of 5%.; The patient completed a course of concurrent chemoradiation with weekly carboplatin  for AUC of 2 and paclitaxel  45 Mg/M2 started on September 20, 2023. She is status post 6 week of treatment. Last day chemotherapy was on 10/24/23. ; ; She is currently on consolidation immunotherapy with Imfinzi  1500 mg IV every 4 weeks. She is status post 4 cycles. Cycle #3 was delayed by a few weeks  due to flu.; ; I will include CT of the neck on scan due to her known lymphadenopathy and hoarseness. Her hoarseness may be post nasal drip FINDINGS: MEDIASTINUM: Heart and pericardium are unremarkable. The central airways are clear. LYMPH NODES: 2.4 cm necrotic left supraclavicular node, previously 2.6 cm. LUNGS AND PLEURA: Patchy left upper and lower lobe opacities favoring radiation changes. Underlying nodularity in the left lower lobe is favored, measuring up to 10 mm (image 75), although this may simply reflect additional radiation changes. Mild right basilar atelectasis. Small left pleural effusion, unchanged. SOFT TISSUES/BONES: No acute abnormality of the bones or soft tissues. UPPER ABDOMEN: 4.2 cm simple right upper pole renal cyst (image 2), benign. No follow-up is recommended. Status post cholecystectomy. Suspected benign hemangioma in segment 8 (image 42) and focal fat along the falciform ligament (image 49). IMPRESSION: 1. Radiation changes in the left hemithorax. 2. Suspected underlying nodularity in the left lower lobe, indeterminate/equivocal, warranting attention on follow-up to exclude  metastatic disease. 3. 2.4 cm necrotic left supraclavicular node, mildly improved. Electronically signed by: Zadie Herter MD 05/07/2024 12:34 AM EDT RP Workstation: [id number]     ASSESSMENT/PLAN:  This is a very pleasant 83 year old African-American female diagnosed with Stage IIIC (T3, N3, M0) non-small cell lung cancer, adenocarcinoma presented with supraclavicular lymphadenopathy, 2 left lung nodules diagnosed in September 2024.  There is also a short segment of wall thickening involving the splenic flexure of the colon with associated mild hypermetabolism which is nonspecific and colonoscopy was recommended.  Molecular studies showed positive KRAS G12C mutation and PD-L1 expression of 5%. The patient completed a course of concurrent chemoradiation with weekly carboplatin  for AUC of 2 and paclitaxel  45 Mg/M2 started on September 20, 2023.  She is status post 6 week of treatment.  Last day chemotherapy was on 10/24/23.    She is currently on consolidation immunotherapy with Imfinzi  1500 mg IV every 4 weeks.  She is status post 5 cycles.  Cycle #3 was delayed by a few weeks due to flu.   Labs were reviewed. Recommend she proceed with cycle #6 today as scheduled.   The patient was seen with Dr. Marguerita Shih today.  Dr. Marguerita Shih personally and independently reviewed the scan and discussed results with the patient today.  The scan showed stable radiation changes in the left hemithorax with some likely underlying nodularity in the left lower lobe which we will continue to monitor.  The left supraclavicular lymph node is mildly improved.  Dr. Marguerita Shih recommends continue on the same treatment at the same dose.  The CT scan of her neck is still pending.  If any concerning findings, I will call the patient.  The patient is not interested in referral to ENT for consideration of laryngoscope to assess her vocal cords if her CT of the neck does not show any concerning findings to explain her hoarseness.  She  will continue to take Delsym if needed for cough.  She also can use Mucinex if needed for mucus production.  Discussed small frequent meals to maintain nutritional intake.  We also discussed the importance of activity as tolerated due to fatigue and slowly building up her stamina/exercise tolerance.  The patient was advised to call immediately if she has any concerning symptoms in the interval. The patient voices understanding of current disease status and treatment options and is in agreement with the current care plan. All questions were answered. The patient knows to call the clinic with any problems, questions or concerns.  We can certainly see the patient much sooner if necessary  No orders of the defined types were placed in this encounter.    Zayne Draheim L Tonilynn Bieker, PA-C 05/07/24  ADDENDUM: Hematology/Oncology Attending: I had a face-to-face encounter with the patient today.  I reviewed her records, lab, scan and recommended her care plan.  This is a very pleasant 83 years old African-American female with stage IIIc non-small cell lung cancer, adenocarcinoma diagnosed in September 2024 with positive KRAS G12C mutation and PD-L1 expression of 5%.  She is status post a course of concurrent chemoradiation with weekly carboplatin  and paclitaxel  and currently on treatment with consolidation immunotherapy with Imfinzi  1500 Mg IV every 4 weeks status post 6 cycles.  She is here today for evaluation before starting cycle #7 with repeat CT scan of the chest for restaging of her disease. I had a lengthy discussion with the patient and her son about her scan results that showed no concerning findings for disease progression and there was some mild decrease in the supraclavicular lymphadenopathy. I recommended for the patient to continue her consolidation treatment with immunotherapy and she will proceed with cycle #7 today. The patient was advised to call immediately if she has any other  concerning symptoms in the interval. The total time spent in the appointment was 30 minutes including review of chart and various tests results, discussions about plan of care and coordination of care plan . Disclaimer: This note was dictated with voice recognition software. Similar sounding words can inadvertently be transcribed and may be missed upon review. Aurelio Blower, MD

## 2024-05-08 ENCOUNTER — Inpatient Hospital Stay (HOSPITAL_BASED_OUTPATIENT_CLINIC_OR_DEPARTMENT_OTHER): Admitting: Physician Assistant

## 2024-05-08 ENCOUNTER — Inpatient Hospital Stay: Attending: Physician Assistant

## 2024-05-08 ENCOUNTER — Inpatient Hospital Stay

## 2024-05-08 VITALS — BP 118/72 | HR 69 | Temp 97.1°F | Resp 13 | Wt 170.6 lb

## 2024-05-08 DIAGNOSIS — Z7982 Long term (current) use of aspirin: Secondary | ICD-10-CM | POA: Insufficient documentation

## 2024-05-08 DIAGNOSIS — Z5112 Encounter for antineoplastic immunotherapy: Secondary | ICD-10-CM | POA: Diagnosis not present

## 2024-05-08 DIAGNOSIS — C3412 Malignant neoplasm of upper lobe, left bronchus or lung: Secondary | ICD-10-CM

## 2024-05-08 DIAGNOSIS — I252 Old myocardial infarction: Secondary | ICD-10-CM | POA: Insufficient documentation

## 2024-05-08 DIAGNOSIS — Z90710 Acquired absence of both cervix and uterus: Secondary | ICD-10-CM | POA: Diagnosis not present

## 2024-05-08 DIAGNOSIS — I10 Essential (primary) hypertension: Secondary | ICD-10-CM | POA: Insufficient documentation

## 2024-05-08 DIAGNOSIS — Z79899 Other long term (current) drug therapy: Secondary | ICD-10-CM | POA: Diagnosis not present

## 2024-05-08 DIAGNOSIS — Z9049 Acquired absence of other specified parts of digestive tract: Secondary | ICD-10-CM | POA: Insufficient documentation

## 2024-05-08 DIAGNOSIS — Z79620 Long term (current) use of immunosuppressive biologic: Secondary | ICD-10-CM | POA: Diagnosis not present

## 2024-05-08 DIAGNOSIS — R490 Dysphonia: Secondary | ICD-10-CM | POA: Diagnosis not present

## 2024-05-08 DIAGNOSIS — Z923 Personal history of irradiation: Secondary | ICD-10-CM | POA: Insufficient documentation

## 2024-05-08 LAB — CBC WITH DIFFERENTIAL (CANCER CENTER ONLY)
Abs Immature Granulocytes: 0.01 10*3/uL (ref 0.00–0.07)
Basophils Absolute: 0 10*3/uL (ref 0.0–0.1)
Basophils Relative: 1 %
Eosinophils Absolute: 0.1 10*3/uL (ref 0.0–0.5)
Eosinophils Relative: 4 %
HCT: 40.7 % (ref 36.0–46.0)
Hemoglobin: 14 g/dL (ref 12.0–15.0)
Immature Granulocytes: 0 %
Lymphocytes Relative: 15 %
Lymphs Abs: 0.5 10*3/uL — ABNORMAL LOW (ref 0.7–4.0)
MCH: 29.9 pg (ref 26.0–34.0)
MCHC: 34.4 g/dL (ref 30.0–36.0)
MCV: 86.8 fL (ref 80.0–100.0)
Monocytes Absolute: 0.4 10*3/uL (ref 0.1–1.0)
Monocytes Relative: 11 %
Neutro Abs: 2.4 10*3/uL (ref 1.7–7.7)
Neutrophils Relative %: 69 %
Platelet Count: 242 10*3/uL (ref 150–400)
RBC: 4.69 MIL/uL (ref 3.87–5.11)
RDW: 13.8 % (ref 11.5–15.5)
WBC Count: 3.4 10*3/uL — ABNORMAL LOW (ref 4.0–10.5)
nRBC: 0 % (ref 0.0–0.2)

## 2024-05-08 LAB — CMP (CANCER CENTER ONLY)
ALT: 13 U/L (ref 0–44)
AST: 24 U/L (ref 15–41)
Albumin: 4 g/dL (ref 3.5–5.0)
Alkaline Phosphatase: 81 U/L (ref 38–126)
Anion gap: 7 (ref 5–15)
BUN: 11 mg/dL (ref 8–23)
CO2: 24 mmol/L (ref 22–32)
Calcium: 9.8 mg/dL (ref 8.9–10.3)
Chloride: 108 mmol/L (ref 98–111)
Creatinine: 0.85 mg/dL (ref 0.44–1.00)
GFR, Estimated: >60 mL/min (ref >60)
Glucose, Bld: 90 mg/dL (ref 70–99)
Potassium: 3.8 mmol/L (ref 3.5–5.1)
Sodium: 139 mmol/L (ref 135–145)
Total Bilirubin: 0.4 mg/dL (ref 0.0–1.2)
Total Protein: 7.1 g/dL (ref 6.5–8.1)

## 2024-05-08 LAB — TSH: TSH: 2.97 u[IU]/mL (ref 0.350–4.500)

## 2024-05-08 MED ORDER — SODIUM CHLORIDE 0.9 % IV SOLN: INTRAVENOUS | Status: DC

## 2024-05-08 MED ORDER — SODIUM CHLORIDE 0.9 % IV SOLN
1500.0000 mg | Freq: Once | INTRAVENOUS | Status: AC
  Administered 2024-05-08: 1500 mg via INTRAVENOUS
  Filled 2024-05-08: qty 30

## 2024-05-08 NOTE — Patient Instructions (Signed)

## 2024-05-09 LAB — T4: T4, Total: 8.3 ug/dL (ref 4.5–12.0)

## 2024-05-11 ENCOUNTER — Telehealth: Payer: Self-pay | Admitting: Physician Assistant

## 2024-05-11 ENCOUNTER — Telehealth: Payer: Self-pay

## 2024-05-11 NOTE — Telephone Encounter (Signed)
 I called the patient to let her know that the CT scan of the neck resulted which is stable.  A CT of the neck was added to her restaging scans because she had been endorsing intermittent hoarseness.  As we previously discussed if she was interested in working up the hoarseness, I would recommend referral to ENT for possible laryngoscope.  When I saw the patient earlier this week it did not sound like she was interested in referral to ENT at this time.  I was calling her just to ensure this was still the case and if she would like me to proceed or hold off on ENT referral at this time.  I gave her our callback number.

## 2024-05-11 NOTE — Telephone Encounter (Signed)
 Spoke with the patient this afternoon regarding the ENT referral recommended by Cassie, PA. The patient stated that she is not interested in pursuing an ENT evaluation at this time. Cassie, PA has been informed of the patient's decision.

## 2024-05-21 ENCOUNTER — Other Ambulatory Visit: Payer: Self-pay | Admitting: Nurse Practitioner

## 2024-05-29 NOTE — Progress Notes (Signed)
 [facility] OFFICE PROGRESS NOTE  [name] LABOR, MD [address] [place] [address]  DIAGNOSIS: Stage IIIC (T3, N3, M0) non-small cell lung cancer, adenocarcinoma presented with supraclavicular lymphadenopathy, 2 left lung nodules diagnosed in September 2024.  There is also a short segment of wall thickening involving the splenic flexure of the colon with associated mild hypermetabolism which is nonspecific and colonoscopy was recommended.    Biomarker Findings HRD signature - HRDsig Negative Microsatellite status - MS-Equivocal ? Tumor Mutational Burden - 2 Muts/Mb Genomic Findings For a complete list of the genes assayed, please refer to the Appendix. KRAS G12C TP53 R110L 7 Disease relevant genes with no reportable alterations: ALK, BRAF, EGFR, ERBB2, MET, RET, ROS1   PDL1 Expression 5%.  PRIOR THERAPY:  Concurrent chemoradiation with carboplatin  for an AUC of 2 and taxol  45 mg/m2. Last dose on 10/24/23. Status post 6 cycles.   CURRENT THERAPY: Consolidation treatment with Imfinzi  1500 Mg IV every 4 weeks status post 6 cycles.   INTERVAL HISTORY: Stephanie Newton 83 y.o. female returns to the clinic today for a follow-up visit. The patient was last seen on 05/08/24. She is currently on immunotherapy and tolerating it well.    She has had a lingering intermittent cough and hoarseness that comes and goes since early march/late February. This is stable. I ordered CT of the neck to evaluate her hoarseness which showed stable disease. If this does not show etiology of her hoarseness, she is not interested in having direct visualization of the vocal cords with ENT.    She denies fever, chills, or hemoptysis. She experiences shortness of breath but notes it has not worsened. It may be a little better than last month per patient report. She experiences a productive cough with yellowish-green sputum which is stable and occasional wheezing. She uses  mucinex.  No nausea, vomiting, diarrhea, or constipation.   She is here for evaluation and repeat blood work before undergoing cycle #7  MEDICAL HISTORY: Past Medical History:  Diagnosis Date   Arthritis    Dysrhythmia    Hypercholesteremia    Hypertension    Myocardial infarction (HCC)     ALLERGIES:  is allergic to shellfish allergy.  MEDICATIONS:  Current Outpatient Medications  Medication Sig Dispense Refill   amLODipine (NORVASC) 2.5 MG tablet Take 2.5 mg by mouth daily.     amoxicillin -clavulanate (AUGMENTIN ) 875-125 MG tablet Take 1 tablet by mouth every 12 (twelve) hours. 14 tablet 0   aspirin  EC 81 MG tablet Take 1 tablet by mouth daily.     doxycycline  (VIBRAMYCIN ) 100 MG capsule Take 1 capsule (100 mg total) by mouth 2 (two) times daily. 20 capsule 0   estradiol  (ESTRACE ) 1 MG tablet Take 1 mg by mouth daily.     famotidine  (PEPCID ) 20 MG tablet Take 20 mg by mouth as needed.     hydrochlorothiazide  (HYDRODIURIL ) 12.5 MG tablet Take 12.5 mg by mouth daily.      methocarbamol  (ROBAXIN ) 500 MG tablet Take 1 tablet (500 mg total) by mouth every 6 (six) hours as needed for muscle spasms. 40 tablet 0   methylPREDNISolone  (MEDROL  DOSEPAK) 4 MG TBPK tablet Use as instructed. 21 tablet 0   metoprolol  succinate (TOPROL -XL) 100 MG 24 hr tablet Take 100 mg by mouth daily.     metoprolol  succinate (TOPROL -XL) 50 MG 24 hr tablet TAKE 1 TABLET BY MOUTH DAILY 90 tablet 11   metoprolol  tartrate (LOPRESSOR ) 100 MG tablet  Take 1 tablet (100 mg total) by mouth once for 1 dose. Take 90-120 minutes prior to scan. Hold for SBP less than 110. 1 tablet 0   oxyCODONE -acetaminophen  (PERCOCET/ROXICET) 5-325 MG tablet Take 1-2 tablets by mouth every 8 (eight) hours as needed for severe pain. 6 tablet 0   prochlorperazine  (COMPAZINE ) 10 MG tablet Take 1 tablet (10 mg total) by mouth every 6 (six) hours as needed. 30 tablet 2   simvastatin  (ZOCOR ) 20 MG tablet Take 20 mg by mouth at bedtime.      sucralfate  (CARAFATE ) 1 g tablet Take 1 tablet (1 g total) by mouth 4 (four) times daily -  with meals and at bedtime. 120 tablet 0   No current facility-administered medications for this visit.    SURGICAL HISTORY:  Past Surgical History:  Procedure Laterality Date   BREAST EXCISIONAL BIOPSY Right    x2   BRONCHIAL BIOPSY  08/30/2023   Procedure: BRONCHIAL BIOPSIES;  Surgeon: Brenna Adine CROME, DO;  Location: [facility];  Service: Pulmonary;;   EYE SURGERY     cataract bilateral    HERNIA REPAIR  06/2000   INCISIONAL VENTRAL ; DR GEORGETTE POLI    KNEE ARTHROSCOPY Left 08/23/2017   dr gerome    LAPAROSCOPIC CHOLECYSTECTOMY  06/13/2000   DR DOUG [facility]    LEFT HEART CATH AND CORONARY ANGIOGRAPHY N/A 12/04/2018   Procedure: LEFT HEART CATH AND CORONARY ANGIOGRAPHY;  Surgeon: Claudene Victory ORN, MD;  Location: [facility] INVASIVE CV LAB;  Service: Cardiovascular;  Laterality: N/A;   TOTAL KNEE ARTHROPLASTY Left 03/31/2018   Procedure: LEFT TOTAL KNEE ARTHROPLASTY;  Surgeon: gerome Charleston, MD;  Location: [facility] ORS;  Service: Orthopedics;  Laterality: Left;  with block   TOTAL KNEE ARTHROPLASTY Right 04/30/2020   Procedure: RIGHT TOTAL KNEE ARTHROPLASTY;  Surgeon: Melodi Lerner, MD;  Location: [facility] ORS;  Service: Orthopedics;  Laterality: Right;    VAGINAL HYSTERECTOMY  age 31s    REVIEW OF SYSTEMS:   Constitutional: Stable fatigue. Negative for appetite change, chills, fever and unexpected weight change.  HENT: Positive for some post nasal drainage and mild hoarseness. Negative for mouth sores, nosebleeds, sore throat and trouble swallowing.   Eyes: Negative for eye problems and icterus.  Respiratory: Stable intermittent cough and stable dyspnea on exertion. Negative for hemoptysis wheezing.   Cardiovascular: Negative for chest pain and leg swelling.  Gastrointestinal: Negative for abdominal pain, constipation, diarrhea, nausea and vomiting.  Genitourinary: Negative for bladder  incontinence, difficulty urinating, dysuria, frequency and hematuria.   Musculoskeletal: Negative for back pain, gait problem, neck pain and neck stiffness.  Skin: Negative for itching and rash.  Neurological: Negative for dizziness, extremity weakness, gait problem, headaches, light-headedness and seizures.  Hematological: Waxing and waning left supraclavicular lymph node. Does not bruise/bleed easily.  Psychiatric/Behavioral: Negative for confusion, depression and sleep disturbance. The patient is not nervous/anxious.       PHYSICAL EXAMINATION:  There were no vitals taken for this visit.  ECOG PERFORMANCE STATUS: 1  Physical Exam  Constitutional: Oriented to person, place, and time and well-developed, well-nourished, and in no distress. HENT:  Head: Normocephalic and atraumatic.  Mouth/Throat: Oropharynx is clear and moist. No oropharyngeal exudate.  Eyes: Conjunctivae are normal. Right eye exhibits no discharge. Left eye exhibits no discharge. No scleral icterus.  Neck: Normal range of motion. Neck supple.  Cardiovascular: Normal rate, regular rhythm, normal heart sounds and intact distal pulses.   Pulmonary/Chest: Effort normal and breath sounds normal. No respiratory distress.  No wheezes. No rales.  Abdominal: Soft. Bowel sounds are normal. Exhibits no distension and no mass. There is no tenderness.  Musculoskeletal: Normal range of motion. Exhibits no edema.  Lymphadenopathy:    Positive for left supraclavicular adenopathy  Neurological: Alert and oriented to person, place, and time. Exhibits normal muscle tone. Gait normal. Coordination normal.  Skin: Skin is warm and dry. No rash noted. Not diaphoretic. No erythema. No pallor.  Psychiatric: Mood, memory and judgment normal.  Vitals reviewed.  LABORATORY DATA: Lab Results  Component Value Date   WBC 3.4 (L) 05/08/2024   HGB 14.0 05/08/2024   HCT 40.7 05/08/2024   MCV 86.8 05/08/2024   PLT 242 05/08/2024       Chemistry      Component Value Date/Time   NA 139 05/08/2024 1039   NA 140 03/07/2024 1600   K 3.8 05/08/2024 1039   CL 108 05/08/2024 1039   CO2 24 05/08/2024 1039   BUN 11 05/08/2024 1039   BUN 7 (L) 03/07/2024 1600   CREATININE 0.85 05/08/2024 1039      Component Value Date/Time   CALCIUM  9.8 05/08/2024 1039   ALKPHOS 81 05/08/2024 1039   AST 24 05/08/2024 1039   ALT 13 05/08/2024 1039   BILITOT 0.4 05/08/2024 1039       RADIOGRAPHIC STUDIES:  CT Soft Tissue Neck W Contrast Result Date: 05/11/2024 CLINICAL DATA:  83 year old female with left upper lobe lung cancer. Metastatic left supraclavicular lymphadenopathy. History of left anterior tentorial meningioma projecting into the ambient cistern on previous brain MRI. EXAM: CT NECK WITH CONTRAST TECHNIQUE: Multidetector CT imaging of the neck was performed using the standard protocol following the bolus administration of intravenous contrast. RADIATION DOSE REDUCTION: This exam was performed according to the departmental dose-optimization program which includes automated exposure control, adjustment of the mA and/or kV according to patient size and/or use of iterative reconstruction technique. CONTRAST:  75mL OMNIPAQUE  IOHEXOL  300 MG/ML  SOLN COMPARISON:  Chest CT the same day is reported separately. Prior Neck CT 11/17/2023, PET-CT 07/06/2023, and brain MRI 10/27/2023. FINDINGS: Pharynx and larynx: Mild larynx and pharynx motion artifact today. Laryngeal and pharyngeal soft tissue contours remain within normal limits. Negative parapharyngeal and retropharyngeal spaces. Salivary glands: Symmetric submandibular gland ductal ectasia appears stable, with superimposed left submandibular gland sialolithiasis. Sublingual space appears stable and negative. No obstructing calculus identified. Parotid glands appear stable and within normal limits. Thyroid : Stable from the prior PET-CT and within normal limits. Lymph nodes: Centrally cystic or  necrotic, irregularly rim enhancing left subclavian metastatic lymph nodes/ex nodal disease redemonstrated. Size now estimated at 30 x 37 x 25 mm (AP by transverse by CC), not significantly changed in size or configuration since 11/17/2023. No direct involvement of the left level 4 nodal station. Other bilateral cervical lymph node stations are stable and normal. Only diminutive bilateral cervical lymph nodes otherwise. Vascular: Major vascular structures in the bilateral neck and at the skull base remain patent with no age advanced atherosclerosis identified. Limited intracranial: Partially visible chronic left anterior to an tori ule meningioma at the ambient cistern (series 3, image 5), grossly stable from the MRI last year (series 13, image 64 of that exam). No midline shift or new intracranial enhancement identified. Visualized orbits: Negative. Mastoids and visualized paranasal sinuses: Visualized paranasal sinuses and mastoids are stable and well aerated. Skeleton: Chronic severe lower cervical spine degeneration. But no acute or suspicious osseous lesion identified in the neck. Upper chest: Reported separately. IMPRESSION: 1.  Stable cystic/necrotic metastatic left subclavian ex-nodal disease since last year. No new lymphadenopathy or new metastatic disease identified in the neck. 2. Partially visible chronic Left tentorial Meningioma. 3. CT Chest reported separately. Electronically Signed   By: VEAR Hurst M.D.   On: 05/11/2024 09:27   CT Chest W Contrast Result Date: 05/07/2024 EXAM: CT CHEST WITH CONTRAST 05/01/2024 03:11:50 PM TECHNIQUE: CT of the chest was performed with the administration of intravenous contrast. Multiplanar reformatted images are provided for review. Automated exposure control, iterative reconstruction, and/or weight based adjustment of the mA/kV was utilized to reduce the radiation dose to as low as reasonably achievable. COMPARISON: CTA chest 02/05/2024 and CT chest dated 11/17/2023.  CLINICAL HISTORY: Non-small cell lung cancer (NSCLC), non-metastatic, assess treatment response. Adenocarcinoma of upper lobe of left lung; NSCLC, non metastatic, assess treatment response; Omnipaque  300; 75 cc ; diagnosed with Stage IIIC (T3, N3, M0) non-small cell lung cancer, adenocarcinoma presented with supraclavicular lymphadenopathy, 2 left lung nodules diagnosed in September 2024. There is also a short segment of wall thickening involving the splenic ; flexure of the colon with associated mild hypermetabolism which is nonspecific and colonoscopy was recommended. ; Molecular studies showed positive KRAS G12C mutation and PD-L1 expression of 5%.; The patient completed a course of concurrent chemoradiation with weekly carboplatin  for AUC of 2 and paclitaxel  45 Mg/M2 started on September 20, 2023. She is status post 6 week of treatment. Last day chemotherapy was on 10/24/23. ; ; She is currently on consolidation immunotherapy with Imfinzi  1500 mg IV every 4 weeks. She is status post 4 cycles. Cycle #3 was delayed by a few weeks due to flu.; ; I will include CT of the neck on scan due to her known lymphadenopathy and hoarseness. Her hoarseness may be post nasal drip FINDINGS: MEDIASTINUM: Heart and pericardium are unremarkable. The central airways are clear. LYMPH NODES: 2.4 cm necrotic left supraclavicular node, previously 2.6 cm. LUNGS AND PLEURA: Patchy left upper and lower lobe opacities favoring radiation changes. Underlying nodularity in the left lower lobe is favored, measuring up to 10 mm (image 75), although this may simply reflect additional radiation changes. Mild right basilar atelectasis. Small left pleural effusion, unchanged. SOFT TISSUES/BONES: No acute abnormality of the bones or soft tissues. UPPER ABDOMEN: 4.2 cm simple right upper pole renal cyst (image 2), benign. No follow-up is recommended. Status post cholecystectomy. Suspected benign hemangioma in segment 8 (image 42) and focal fat along  the falciform ligament (image 49). IMPRESSION: 1. Radiation changes in the left hemithorax. 2. Suspected underlying nodularity in the left lower lobe, indeterminate/equivocal, warranting attention on follow-up to exclude metastatic disease. 3. 2.4 cm necrotic left supraclavicular node, mildly improved. Electronically signed by: Pinkie Pebbles MD 05/07/2024 12:34 AM EDT RP Workstation: [id number]     ASSESSMENT/PLAN:  This is a very pleasant 83 year old African-American female diagnosed with Stage IIIC (T3, N3, M0) non-small cell lung cancer, adenocarcinoma presented with supraclavicular lymphadenopathy, 2 left lung nodules diagnosed in September 2024.  There is also a short segment of wall thickening involving the splenic flexure of the colon with associated mild hypermetabolism which is nonspecific and colonoscopy was recommended.  Molecular studies showed positive KRAS G12C mutation and PD-L1 expression of 5%. The patient completed a course of concurrent chemoradiation with weekly carboplatin  for AUC of 2 and paclitaxel  45 Mg/M2 started on September 20, 2023.  She is status post 6 week of treatment.  Last day chemotherapy was on 10/24/23.    She is  currently on consolidation immunotherapy with Imfinzi  1500 mg IV every 4 weeks. She is status post 6 cycles.    Labs were reviewed. Recommend she proceed with cycle #7 today as scheduled.     We will see her back for labs and follow up in 4 weeks for evaluation and repeat blood work before undergoing cycle #8.   She is wondering if she still needs a mammogram. Discussed importance of mammogram for breast cancer screening. CT scans do not adequately visualize breast tissue. - Recommend scheduling a mammogram if she is due  She is not interested in seeing ENT for the hoarseness.   The patient was advised to call immediately if she has any concerning symptoms in the interval. The patient voices understanding of current disease status and treatment  options and is in agreement with the current care plan. All questions were answered. The patient knows to call the clinic with any problems, questions or concerns. We can certainly see the patient much sooner if necessary   No orders of the defined types were placed in this encounter.   The total time spent in the appointment was 20-29 minutes  Kaleen Rochette L Wyndham Santilli, PA-C 05/29/24

## 2024-06-05 ENCOUNTER — Inpatient Hospital Stay (HOSPITAL_BASED_OUTPATIENT_CLINIC_OR_DEPARTMENT_OTHER): Admitting: Physician Assistant

## 2024-06-05 ENCOUNTER — Inpatient Hospital Stay: Attending: Physician Assistant

## 2024-06-05 ENCOUNTER — Inpatient Hospital Stay

## 2024-06-05 VITALS — BP 120/63 | HR 64 | Temp 97.7°F | Resp 16

## 2024-06-05 VITALS — BP 123/62 | HR 74 | Temp 97.3°F | Resp 15 | Wt 170.9 lb

## 2024-06-05 DIAGNOSIS — Z9221 Personal history of antineoplastic chemotherapy: Secondary | ICD-10-CM | POA: Insufficient documentation

## 2024-06-05 DIAGNOSIS — Z90710 Acquired absence of both cervix and uterus: Secondary | ICD-10-CM | POA: Diagnosis not present

## 2024-06-05 DIAGNOSIS — R0602 Shortness of breath: Secondary | ICD-10-CM | POA: Diagnosis not present

## 2024-06-05 DIAGNOSIS — D329 Benign neoplasm of meninges, unspecified: Secondary | ICD-10-CM | POA: Diagnosis not present

## 2024-06-05 DIAGNOSIS — I10 Essential (primary) hypertension: Secondary | ICD-10-CM | POA: Insufficient documentation

## 2024-06-05 DIAGNOSIS — Z96653 Presence of artificial knee joint, bilateral: Secondary | ICD-10-CM | POA: Insufficient documentation

## 2024-06-05 DIAGNOSIS — Z7982 Long term (current) use of aspirin: Secondary | ICD-10-CM | POA: Diagnosis not present

## 2024-06-05 DIAGNOSIS — K115 Sialolithiasis: Secondary | ICD-10-CM | POA: Insufficient documentation

## 2024-06-05 DIAGNOSIS — R590 Localized enlarged lymph nodes: Secondary | ICD-10-CM | POA: Diagnosis not present

## 2024-06-05 DIAGNOSIS — Z9049 Acquired absence of other specified parts of digestive tract: Secondary | ICD-10-CM | POA: Insufficient documentation

## 2024-06-05 DIAGNOSIS — R062 Wheezing: Secondary | ICD-10-CM | POA: Diagnosis not present

## 2024-06-05 DIAGNOSIS — R053 Chronic cough: Secondary | ICD-10-CM | POA: Diagnosis not present

## 2024-06-05 DIAGNOSIS — Z923 Personal history of irradiation: Secondary | ICD-10-CM | POA: Insufficient documentation

## 2024-06-05 DIAGNOSIS — I252 Old myocardial infarction: Secondary | ICD-10-CM | POA: Diagnosis not present

## 2024-06-05 DIAGNOSIS — R058 Other specified cough: Secondary | ICD-10-CM | POA: Insufficient documentation

## 2024-06-05 DIAGNOSIS — Z79899 Other long term (current) drug therapy: Secondary | ICD-10-CM | POA: Insufficient documentation

## 2024-06-05 DIAGNOSIS — R490 Dysphonia: Secondary | ICD-10-CM | POA: Diagnosis not present

## 2024-06-05 DIAGNOSIS — C3412 Malignant neoplasm of upper lobe, left bronchus or lung: Secondary | ICD-10-CM | POA: Diagnosis not present

## 2024-06-05 DIAGNOSIS — Z5112 Encounter for antineoplastic immunotherapy: Secondary | ICD-10-CM | POA: Diagnosis not present

## 2024-06-05 LAB — CMP (CANCER CENTER ONLY)
ALT: 15 U/L (ref 0–44)
AST: 25 U/L (ref 15–41)
Albumin: 4.1 g/dL (ref 3.5–5.0)
Alkaline Phosphatase: 90 U/L (ref 38–126)
Anion gap: 9 (ref 5–15)
BUN: 16 mg/dL (ref 8–23)
CO2: 25 mmol/L (ref 22–32)
Calcium: 10 mg/dL (ref 8.9–10.3)
Chloride: 105 mmol/L (ref 98–111)
Creatinine: 0.91 mg/dL (ref 0.44–1.00)
GFR, Estimated: >60 mL/min (ref >60)
Glucose, Bld: 147 mg/dL — ABNORMAL HIGH (ref 70–99)
Potassium: 3.6 mmol/L (ref 3.5–5.1)
Sodium: 139 mmol/L (ref 135–145)
Total Bilirubin: 0.4 mg/dL (ref 0.0–1.2)
Total Protein: 7.1 g/dL (ref 6.5–8.1)

## 2024-06-05 LAB — CBC WITH DIFFERENTIAL (CANCER CENTER ONLY)
Abs Immature Granulocytes: 0.01 10*3/uL (ref 0.00–0.07)
Basophils Absolute: 0 10*3/uL (ref 0.0–0.1)
Basophils Relative: 1 %
Eosinophils Absolute: 0.2 10*3/uL (ref 0.0–0.5)
Eosinophils Relative: 5 %
HCT: 41.6 % (ref 36.0–46.0)
Hemoglobin: 14.1 g/dL (ref 12.0–15.0)
Immature Granulocytes: 0 %
Lymphocytes Relative: 17 %
Lymphs Abs: 0.6 10*3/uL — ABNORMAL LOW (ref 0.7–4.0)
MCH: 29.7 pg (ref 26.0–34.0)
MCHC: 33.9 g/dL (ref 30.0–36.0)
MCV: 87.6 fL (ref 80.0–100.0)
Monocytes Absolute: 0.3 10*3/uL (ref 0.1–1.0)
Monocytes Relative: 10 %
Neutro Abs: 2.2 10*3/uL (ref 1.7–7.7)
Neutrophils Relative %: 67 %
Platelet Count: 221 10*3/uL (ref 150–400)
RBC: 4.75 MIL/uL (ref 3.87–5.11)
RDW: 13.2 % (ref 11.5–15.5)
WBC Count: 3.3 10*3/uL — ABNORMAL LOW (ref 4.0–10.5)
nRBC: 0 % (ref 0.0–0.2)

## 2024-06-05 MED ORDER — SODIUM CHLORIDE 0.9 % IV SOLN: INTRAVENOUS | Status: DC

## 2024-06-05 MED ORDER — SODIUM CHLORIDE 0.9 % IV SOLN
1500.0000 mg | Freq: Once | INTRAVENOUS | Status: AC
  Administered 2024-06-05: 1500 mg via INTRAVENOUS
  Filled 2024-06-05: qty 30

## 2024-06-05 NOTE — Patient Instructions (Signed)
 [facility] - A DEPT OF [facility]  Discharge Instructions: Thank you for choosing [facility] to provide your oncology and hematology care.   If you have a lab appointment with the Cancer Center, please go directly to the Cancer Center and check in at the registration area.   Wear comfortable clothing and clothing appropriate for easy access to any Portacath or PICC line.   We strive to give you quality time with your provider. You may need to reschedule your appointment if you arrive late (15 or more minutes).  Arriving late affects you and other patients whose appointments are after yours.  Also, if you miss three or more appointments without notifying the office, you may be dismissed from the clinic at the provider's discretion.      For prescription refill requests, have your pharmacy contact our office and allow 72 hours for refills to be completed.    Today you received the following chemotherapy and/or immunotherapy agent: Durvalumab (Imfinzi)      To help prevent nausea and vomiting after your treatment, we encourage you to take your nausea medication as directed.  BELOW ARE SYMPTOMS THAT SHOULD BE REPORTED IMMEDIATELY: *FEVER GREATER THAN 100.4 F (38 C) OR HIGHER *CHILLS OR SWEATING *NAUSEA AND VOMITING THAT IS NOT CONTROLLED WITH YOUR NAUSEA MEDICATION *UNUSUAL SHORTNESS OF BREATH *UNUSUAL BRUISING OR BLEEDING *URINARY PROBLEMS (pain or burning when urinating, or frequent urination) *BOWEL PROBLEMS (unusual diarrhea, constipation, pain near the anus) TENDERNESS IN MOUTH AND THROAT WITH OR WITHOUT PRESENCE OF ULCERS (sore throat, sores in mouth, or a toothache) UNUSUAL RASH, SWELLING OR PAIN  UNUSUAL VAGINAL DISCHARGE OR ITCHING   Items with * indicate a potential emergency and should be followed up as soon as possible or go to the Emergency Department if any problems should occur.  Please show the CHEMOTHERAPY ALERT CARD or  IMMUNOTHERAPY ALERT CARD at check-in to the Emergency Department and triage nurse.  Should you have questions after your visit or need to cancel or reschedule your appointment, please contact [facility] - A DEPT OF [facility]  Dept: [phone]  and follow the prompts.  Office hours are 8:00 a.m. to 4:30 p.m. Monday - Friday. Please note that voicemails left after 4:00 p.m. may not be returned until the following business day.  We are closed weekends and major holidays. You have access to a nurse at all times for urgent questions. Please call the main number to the clinic Dept: [phone] and follow the prompts.   For any non-urgent questions, you may also contact your provider using MyChart. We now offer e-Visits for anyone 52 and older to request care online for non-urgent symptoms. For details visit [email or web address].   Also download the MyChart app! Go to the app store, search "MyChart", open the app, select [place], and log in with your MyChart username and password.  Durvalumab Injection What is this medication? DURVALUMAB (dur VAL ue mab) treats some types of cancer. It works by helping your immune system slow or stop the spread of cancer cells. It is a monoclonal antibody. This medicine may be used for other purposes; ask your health care provider or pharmacist if you have questions. COMMON BRAND NAME(S): IMFINZI What should I tell my care team before I take this medication? They need to know if you have any of these conditions: Allogeneic stem cell transplant (uses someone else's stem cells)  Autoimmune diseases, such as Crohn disease, ulcerative colitis, lupus History of chest radiation Nervous system problems, such as Guillain-Barre syndrome, myasthenia gravis Organ transplant An unusual or allergic reaction to durvalumab, other medications, foods, dyes, or preservatives Pregnant or trying to get pregnant Breast-feeding How should I use  this medication? This medication is infused into a vein. It is given by your care team in a hospital or clinic setting. A special MedGuide will be given to you before each treatment. Be sure to read this information carefully each time. Talk to your care team about the use of this medication in children. Special care may be needed. Overdosage: If you think you have taken too much of this medicine contact a poison control center or emergency room at once. NOTE: This medicine is only for you. Do not share this medicine with others. What if I miss a dose? Keep appointments for follow-up doses. It is important not to miss your dose. Call your care team if you are unable to keep an appointment. What may interact with this medication? Interactions have not been studied. This list may not describe all possible interactions. Give your health care provider a list of all the medicines, herbs, non-prescription drugs, or dietary supplements you use. Also tell them if you smoke, drink alcohol, or use illegal drugs. Some items may interact with your medicine. What should I watch for while using this medication? Your condition will be monitored carefully while you are receiving this medication. You may need blood work while taking this medication. This medication may cause serious skin reactions. They can happen weeks to months after starting the medication. Contact your care team right away if you notice fevers or flu-like symptoms with a rash. The rash may be red or purple and then turn into blisters or peeling of the skin. You may also notice a red rash with swelling of the face, lips, or lymph nodes in your neck or under your arms. Tell your care team right away if you have any change in your eyesight. Talk to your care team if you may be pregnant. Serious birth defects can occur if you take this medication during pregnancy and for 3 months after the last dose. You will need a negative pregnancy test before  starting this medication. Contraception is recommended while taking this medication and for 3 months after the last dose. Your care team can help you find the option that works for you. Do not breastfeed while taking this medication and for 3 months after the last dose. What side effects may I notice from receiving this medication? Side effects that you should report to your care team as soon as possible: Allergic reactions--skin rash, itching, hives, swelling of the face, lips, tongue, or throat Dry cough, shortness of breath or trouble breathing Eye pain, redness, irritation, or discharge with blurry or decreased vision Heart muscle inflammation--unusual weakness or fatigue, shortness of breath, chest pain, fast or irregular heartbeat, dizziness, swelling of the ankles, feet, or hands Hormone gland problems--headache, sensitivity to light, unusual weakness or fatigue, dizziness, fast or irregular heartbeat, increased sensitivity to cold or heat, excessive sweating, constipation, hair loss, increased thirst or amount of urine, tremors or shaking, irritability Infusion reactions--chest pain, shortness of breath or trouble breathing, feeling faint or lightheaded Kidney injury (glomerulonephritis)--decrease in the amount of urine, red or dark brown urine, foamy or bubbly urine, swelling of the ankles, hands, or feet Liver injury--right upper belly pain, loss of appetite, nausea, light-colored stool, dark yellow  or brown urine, yellowing skin or eyes, unusual weakness or fatigue Pain, tingling, or numbness in the hands or feet, muscle weakness, change in vision, confusion or trouble speaking, loss of balance or coordination, trouble walking, seizures Rash, fever, and swollen lymph nodes Redness, blistering, peeling, or loosening of the skin, including inside the mouth Sudden or severe stomach pain, bloody diarrhea, fever, nausea, vomiting Side effects that usually do not require medical attention  (report these to your care team if they continue or are bothersome): Bone, joint, or muscle pain Diarrhea Fatigue Loss of appetite Nausea Skin rash This list may not describe all possible side effects. Call your doctor for medical advice about side effects. You may report side effects to FDA at 1-800-FDA-1088. Where should I keep my medication? This medication is given in a hospital or clinic. It will not be stored at home. NOTE: This sheet is a summary. It may not cover all possible information. If you have questions about this medicine, talk to your doctor, pharmacist, or health care provider.  2024 Elsevier/Gold Standard (2022-04-06 00:00:00)

## 2024-07-03 ENCOUNTER — Inpatient Hospital Stay

## 2024-07-03 ENCOUNTER — Inpatient Hospital Stay (HOSPITAL_BASED_OUTPATIENT_CLINIC_OR_DEPARTMENT_OTHER): Admitting: Internal Medicine

## 2024-07-03 VITALS — BP 113/74 | HR 69 | Temp 97.1°F | Resp 16 | Ht 65.0 in | Wt 169.0 lb

## 2024-07-03 DIAGNOSIS — R058 Other specified cough: Secondary | ICD-10-CM | POA: Diagnosis not present

## 2024-07-03 DIAGNOSIS — C3412 Malignant neoplasm of upper lobe, left bronchus or lung: Secondary | ICD-10-CM

## 2024-07-03 DIAGNOSIS — R490 Dysphonia: Secondary | ICD-10-CM | POA: Diagnosis not present

## 2024-07-03 DIAGNOSIS — R590 Localized enlarged lymph nodes: Secondary | ICD-10-CM | POA: Diagnosis not present

## 2024-07-03 DIAGNOSIS — Z5112 Encounter for antineoplastic immunotherapy: Secondary | ICD-10-CM | POA: Diagnosis not present

## 2024-07-03 DIAGNOSIS — C3490 Malignant neoplasm of unspecified part of unspecified bronchus or lung: Secondary | ICD-10-CM

## 2024-07-03 DIAGNOSIS — R0602 Shortness of breath: Secondary | ICD-10-CM | POA: Diagnosis not present

## 2024-07-03 LAB — CMP (CANCER CENTER ONLY)
ALT: 13 U/L (ref 0–44)
AST: 24 U/L (ref 15–41)
Albumin: 3.8 g/dL (ref 3.5–5.0)
Alkaline Phosphatase: 88 U/L (ref 38–126)
Anion gap: 7 (ref 5–15)
BUN: 11 mg/dL (ref 8–23)
CO2: 26 mmol/L (ref 22–32)
Calcium: 9.8 mg/dL (ref 8.9–10.3)
Chloride: 105 mmol/L (ref 98–111)
Creatinine: 0.96 mg/dL (ref 0.44–1.00)
GFR, Estimated: 60 mL/min — ABNORMAL LOW (ref >60)
Glucose, Bld: 131 mg/dL — ABNORMAL HIGH (ref 70–99)
Potassium: 3.3 mmol/L — ABNORMAL LOW (ref 3.5–5.1)
Sodium: 138 mmol/L (ref 135–145)
Total Bilirubin: 0.4 mg/dL (ref 0.0–1.2)
Total Protein: 7 g/dL (ref 6.5–8.1)

## 2024-07-03 LAB — CBC WITH DIFFERENTIAL (CANCER CENTER ONLY)
Abs Immature Granulocytes: 0.01 K/uL (ref 0.00–0.07)
Basophils Absolute: 0 K/uL (ref 0.0–0.1)
Basophils Relative: 1 %
Eosinophils Absolute: 0.2 K/uL (ref 0.0–0.5)
Eosinophils Relative: 5 %
HCT: 39.8 % (ref 36.0–46.0)
Hemoglobin: 13.6 g/dL (ref 12.0–15.0)
Immature Granulocytes: 0 %
Lymphocytes Relative: 17 %
Lymphs Abs: 0.6 K/uL — ABNORMAL LOW (ref 0.7–4.0)
MCH: 29.6 pg (ref 26.0–34.0)
MCHC: 34.2 g/dL (ref 30.0–36.0)
MCV: 86.5 fL (ref 80.0–100.0)
Monocytes Absolute: 0.4 K/uL (ref 0.1–1.0)
Monocytes Relative: 12 %
Neutro Abs: 2.2 K/uL (ref 1.7–7.7)
Neutrophils Relative %: 65 %
Platelet Count: 207 K/uL (ref 150–400)
RBC: 4.6 MIL/uL (ref 3.87–5.11)
RDW: 13.1 % (ref 11.5–15.5)
WBC Count: 3.4 K/uL — ABNORMAL LOW (ref 4.0–10.5)
nRBC: 0 % (ref 0.0–0.2)

## 2024-07-03 MED ORDER — SODIUM CHLORIDE 0.9 % IV SOLN: INTRAVENOUS | Status: DC

## 2024-07-03 MED ORDER — SODIUM CHLORIDE 0.9 % IV SOLN
1500.0000 mg | Freq: Once | INTRAVENOUS | Status: AC
  Administered 2024-07-03: 1500 mg via INTRAVENOUS
  Filled 2024-07-03: qty 30

## 2024-07-03 NOTE — Progress Notes (Signed)
 [facility] [phone]   Fax:([phone]  OFFICE PROGRESS NOTE  Stephanie Newton LABOR, MD [address] [place] [address]  DIAGNOSIS: Stage IIIC (T3, N3, M0) non-small cell lung cancer, adenocarcinoma presented with supraclavicular lymphadenopathy, 2 left lung nodules diagnosed in September 2024.  There is also a short segment of wall thickening involving the splenic flexure of the colon with associated mild hypermetabolism which is nonspecific and colonoscopy was recommended.   Biomarker Findings HRD signature - HRDsig Negative Microsatellite status - MS-Equivocal ? Tumor Mutational Burden - 2 Muts/Mb Genomic Findings For a complete list of the genes assayed, please refer to the Appendix. KRAS G12C TP53 R110L 7 Disease relevant genes with no reportable alterations: ALK, BRAF, EGFR, ERBB2, MET, RET, ROS1  PDL1 Expression 5%.  PRIOR THERAPY: Concurrent chemoradiation with carboplatin  for an AUC of 2 and taxol  45 mg/m2.  First dose September 20, 2023.  Status post 6 cycles.   CURRENT THERAPY: Consolidation treatment with Imfinzi  1500 Mg IV every 4 weeks status post  7 cycles.   INTERVAL HISTORY: Stephanie Newton 83 y.o. female returns to the clinic today for follow-up visit. Discussed the use of AI scribe software for clinical note transcription with the patient, who gave verbal consent to proceed.  History of Present Illness Stephanie Newton is an 83 year old female with stage III C non-small cell lung cancer who presents for evaluation before starting cycle number eight of immunotherapy.  She has stage III C non-small cell lung cancer with adenocarcinoma, characterized by a positive KRSG 12 C mutation and PD-L1 expression of 5%. She is post concurrent chemoradiation with weekly carboplatin  and paclitaxel  and is currently undergoing consolidation treatment with immunotherapy using Imfinzi  at a dose of 1500 mg IV every four  weeks. She has completed seven cycles and is here for evaluation before starting cycle number eight.  She has no new complaints and describes the immunotherapy as 'not bad'. No side effects such as rash, diarrhea, nausea, or vomiting. She reports a persistent cough and some changes in her voice, which have improved slightly but are not completely resolved.  Her weight is stable, with a minor loss of about a pound, and she feels that it 'didn't beat me up or anything'. Family is reported to be doing well, with no new issues mentioned.     MEDICAL HISTORY: Past Medical History:  Diagnosis Date   Arthritis    Dysrhythmia    Hypercholesteremia    Hypertension    Myocardial infarction [facility])     ALLERGIES:  is allergic to shellfish allergy.  MEDICATIONS:  Current Outpatient Medications  Medication Sig Dispense Refill   amLODipine (NORVASC) 2.5 MG tablet Take 2.5 mg by mouth daily.     amoxicillin -clavulanate (AUGMENTIN ) 875-125 MG tablet Take 1 tablet by mouth every 12 (twelve) hours. 14 tablet 0   aspirin  EC 81 MG tablet Take 1 tablet by mouth daily.     doxycycline  (VIBRAMYCIN ) 100 MG capsule Take 1 capsule (100 mg total) by mouth 2 (two) times daily. 20 capsule 0   estradiol  (ESTRACE ) 1 MG tablet Take 1 mg by mouth daily.     famotidine  (PEPCID ) 20 MG tablet Take 20 mg by mouth as needed.     hydrochlorothiazide  (HYDRODIURIL ) 12.5 MG tablet Take 12.5 mg by mouth daily.      methocarbamol  (ROBAXIN ) 500 MG tablet Take 1 tablet (500 mg total) by mouth every 6 (six)  hours as needed for muscle spasms. 40 tablet 0   methylPREDNISolone  (MEDROL  DOSEPAK) 4 MG TBPK tablet Use as instructed. 21 tablet 0   metoprolol  succinate (TOPROL -XL) 100 MG 24 hr tablet Take 100 mg by mouth daily.     metoprolol  succinate (TOPROL -XL) 50 MG 24 hr tablet TAKE 1 TABLET BY MOUTH DAILY 90 tablet 11   metoprolol  tartrate (LOPRESSOR ) 100 MG tablet Take 1 tablet (100 mg total) by mouth once for 1 dose. Take 90-120  minutes prior to scan. Hold for SBP less than 110. 1 tablet 0   oxyCODONE -acetaminophen  (PERCOCET/ROXICET) 5-325 MG tablet Take 1-2 tablets by mouth every 8 (eight) hours as needed for severe pain. 6 tablet 0   prochlorperazine  (COMPAZINE ) 10 MG tablet Take 1 tablet (10 mg total) by mouth every 6 (six) hours as needed. 30 tablet 2   simvastatin  (ZOCOR ) 20 MG tablet Take 20 mg by mouth at bedtime.     sucralfate  (CARAFATE ) 1 g tablet Take 1 tablet (1 g total) by mouth 4 (four) times daily -  with meals and at bedtime. 120 tablet 0   No current facility-administered medications for this visit.    SURGICAL HISTORY:  Past Surgical History:  Procedure Laterality Date   BREAST EXCISIONAL BIOPSY Right    x2   BRONCHIAL BIOPSY  08/30/2023   Procedure: BRONCHIAL BIOPSIES;  Surgeon: Brenna Adine CROME, DO;  Location: [facility];  Service: Pulmonary;;   EYE SURGERY     cataract bilateral    HERNIA REPAIR  06/2000   INCISIONAL VENTRAL ; DR GEORGETTE POLI    KNEE ARTHROSCOPY Left 08/23/2017   dr gerome    LAPAROSCOPIC CHOLECYSTECTOMY  06/13/2000   DR DOUG [facility]    LEFT HEART CATH AND CORONARY ANGIOGRAPHY N/A 12/04/2018   Procedure: LEFT HEART CATH AND CORONARY ANGIOGRAPHY;  Surgeon: Claudene Victory ORN, MD;  Location: [facility];  Service: Cardiovascular;  Laterality: N/A;   TOTAL KNEE ARTHROPLASTY Left 03/31/2018   Procedure: LEFT TOTAL KNEE ARTHROPLASTY;  Surgeon: gerome Charleston, MD;  Location: [facility];  Service: Orthopedics;  Laterality: Left;  with block   TOTAL KNEE ARTHROPLASTY Right 04/30/2020   Procedure: RIGHT TOTAL KNEE ARTHROPLASTY;  Surgeon: Melodi Lerner, MD;  Location: [facility];  Service: Orthopedics;  Laterality: Right;    VAGINAL HYSTERECTOMY  age 39s    REVIEW OF SYSTEMS:  A comprehensive review of systems was negative.   PHYSICAL EXAMINATION: General appearance: alert, cooperative, and no distress Head: Normocephalic, without obvious abnormality, atraumatic Neck:  no adenopathy, no JVD, supple, symmetrical, trachea midline, and thyroid  not enlarged, symmetric, no tenderness/mass/nodules Lymph nodes: Cervical, supraclavicular, and axillary nodes normal. Resp: clear to auscultation bilaterally Back: symmetric, no curvature. ROM normal. No CVA tenderness. Cardio: regular rate and rhythm, S1, S2 normal, no murmur, click, rub or gallop GI: soft, non-tender; bowel sounds normal; no masses,  no organomegaly Extremities: extremities normal, atraumatic, no cyanosis or edema  ECOG PERFORMANCE STATUS: 1 - Symptomatic but completely ambulatory  Blood pressure 113/74, pulse 69, temperature (!) 97.1 F (36.2 C), temperature source Temporal, resp. rate 16, height 5' 5 (1.651 m), weight 169 lb (76.7 kg), SpO2 99%.  LABORATORY DATA: Lab Results  Component Value Date   WBC 3.4 (L) 07/03/2024   HGB 13.6 07/03/2024   HCT 39.8 07/03/2024   MCV 86.5 07/03/2024   PLT 207 07/03/2024      Chemistry      Component Value Date/Time   NA 138 07/03/2024 0950  NA 140 03/07/2024 1600   K 3.3 (L) 07/03/2024 0950   CL 105 07/03/2024 0950   CO2 26 07/03/2024 0950   BUN 11 07/03/2024 0950   BUN 7 (L) 03/07/2024 1600   CREATININE 0.96 07/03/2024 0950      Component Value Date/Time   CALCIUM  9.8 07/03/2024 0950   ALKPHOS 88 07/03/2024 0950   AST 24 07/03/2024 0950   ALT 13 07/03/2024 0950   BILITOT 0.4 07/03/2024 0950       RADIOGRAPHIC STUDIES: No results found.   ASSESSMENT AND PLAN: This is a very pleasant 83 years old African-American female diagnosed with Stage IIIC (T3, N3, M0) non-small cell lung cancer, adenocarcinoma presented with supraclavicular lymphadenopathy, 2 left lung nodules diagnosed in September 2024.  There is also a short segment of wall thickening involving the splenic flexure of the colon with associated mild hypermetabolism which is nonspecific and colonoscopy was recommended.  Molecular studies showed positive KRAS G12C mutation and  PD-L1 expression of 5%. The patient completed a course of concurrent chemoradiation with weekly carboplatin  for AUC of 2 and paclitaxel  45 Mg/M2 started on September 20, 2023.  She is status post 6 week of treatment. The patient is currently undergoing consolidation treatment with immunotherapy with Imfinzi  1500 Mg IV every 4 weeks status post 7 cycles.  The patient has been tolerating this treatment fairly well. Assessment and Plan Assessment & Plan Stage IIIC non-small cell lung cancer Stage IIIC non-small cell lung cancer with adenocarcinoma subtype, positive KRSG 12 C mutation, and PD-L1 expression of 5%. Currently undergoing consolidation treatment with Imfinzi  (durvalumab ) 1500 mg IV every four weeks. No new complaints or side effects reported. Breathing is well-managed, and weight is maintained with a slight loss of about a pound. Last imaging was in May, and a follow-up scan is due. - Administer cycle number eight of Imfinzi  1500 mg IV. - Order chest scan one week before the next visit in four weeks to evaluate treatment response.  Cough Persistent cough with slight improvement. No associated respiratory distress or significant weight loss. She was advised to call immediately if she has any concerning symptoms in the interval.  The patient voices understanding of current disease status and treatment options and is in agreement with the current care plan.  All questions were answered. The patient knows to call the clinic with any problems, questions or concerns. We can certainly see the patient much sooner if necessary.  The total time spent in the appointment was 20 minutes.  Disclaimer: This note was dictated with voice recognition software. Similar sounding words can inadvertently be transcribed and may not be corrected upon review.

## 2024-07-03 NOTE — Patient Instructions (Signed)

## 2024-07-24 ENCOUNTER — Ambulatory Visit (HOSPITAL_COMMUNITY)
Admission: RE | Admit: 2024-07-24 | Discharge: 2024-07-24 | Disposition: A | Discharge status: Home / Self Care | Source: Ambulatory Visit | Attending: Internal Medicine | Admitting: Internal Medicine

## 2024-07-24 DIAGNOSIS — J432 Centrilobular emphysema: Secondary | ICD-10-CM | POA: Diagnosis not present

## 2024-07-24 DIAGNOSIS — R590 Localized enlarged lymph nodes: Secondary | ICD-10-CM | POA: Diagnosis not present

## 2024-07-24 DIAGNOSIS — C3490 Malignant neoplasm of unspecified part of unspecified bronchus or lung: Secondary | ICD-10-CM | POA: Insufficient documentation

## 2024-07-24 DIAGNOSIS — J189 Pneumonia, unspecified organism: Secondary | ICD-10-CM | POA: Diagnosis not present

## 2024-07-24 MED ORDER — IOHEXOL 300 MG/ML  SOLN
75.0000 mL | Freq: Once | INTRAMUSCULAR | Status: AC | PRN
  Administered 2024-07-24: 75 mL via INTRAVENOUS

## 2024-07-26 NOTE — Progress Notes (Signed)
 [facility] OFFICE PROGRESS NOTE  [name] LABOR, MD [address] [place] [address]  DIAGNOSIS: Stage IIIC (T3, N3, M0) non-small cell lung cancer, adenocarcinoma presented with supraclavicular lymphadenopathy, 2 left lung nodules diagnosed in September 2024.  There is also a short segment of wall thickening involving the splenic flexure of the colon with associated mild hypermetabolism which is nonspecific and colonoscopy was recommended.    Biomarker Findings HRD signature - HRDsig Negative Microsatellite status - MS-Equivocal ? Tumor Mutational Burden - 2 Muts/Mb Genomic Findings For a complete list of the genes assayed, please refer to the Appendix. KRAS G12C TP53 R110L 7 Disease relevant genes with no reportable alterations: ALK, BRAF, EGFR, ERBB2, MET, RET, ROS1   PDL1 Expression 5%.  PRIOR THERAPY: Concurrent chemoradiation with carboplatin  for an AUC of 2 and taxol  45 mg/m2. Last dose on 10/24/23. Status post 6 cycles.   CURRENT THERAPY: Consolidation treatment with Imfinzi  1500 Mg IV every 4 weeks status post 8 cycles.     INTERVAL HISTORY: Stephanie Newton 83 y.o. female returns o the clinic today for a follow-up visit. The patient was last seen on 06/05/24. She is currently on immunotherapy and tolerating it well.   She denies fever, chills, or hemoptysis. She experiences shortness of breath but notes it has not worsened. She is not coughing very much. Did didn't feel well last week and is unsure if she had a cold.  No nausea, vomiting, diarrhea, or constipation.  She denies headache or vision changes. She denies rashes or skin changes except she mentions a dry, scaly patch on her chin, which she has been treating with hydrocortisone cream. She still has some voice hoarseness but is not interested in seeing ENT.  She recently had a restaging CT scan. She is here for evaluation and repeat blood work before undergoing cycle #8  MEDICAL  HISTORY: Past Medical History:  Diagnosis Date   Arthritis    Dysrhythmia    Hypercholesteremia    Hypertension    Myocardial infarction (HCC)     ALLERGIES:  is allergic to shellfish allergy.  MEDICATIONS:  Current Outpatient Medications  Medication Sig Dispense Refill   amLODipine (NORVASC) 2.5 MG tablet Take 2.5 mg by mouth daily.     amoxicillin -clavulanate (AUGMENTIN ) 875-125 MG tablet Take 1 tablet by mouth every 12 (twelve) hours. 14 tablet 0   aspirin  EC 81 MG tablet Take 1 tablet by mouth daily.     doxycycline  (VIBRAMYCIN ) 100 MG capsule Take 1 capsule (100 mg total) by mouth 2 (two) times daily. 20 capsule 0   estradiol  (ESTRACE ) 1 MG tablet Take 1 mg by mouth daily.     famotidine  (PEPCID ) 20 MG tablet Take 20 mg by mouth as needed.     hydrochlorothiazide  (HYDRODIURIL ) 12.5 MG tablet Take 12.5 mg by mouth daily.      methocarbamol  (ROBAXIN ) 500 MG tablet Take 1 tablet (500 mg total) by mouth every 6 (six) hours as needed for muscle spasms. 40 tablet 0   methylPREDNISolone  (MEDROL  DOSEPAK) 4 MG TBPK tablet Use as instructed. 21 tablet 0   metoprolol  succinate (TOPROL -XL) 100 MG 24 hr tablet Take 100 mg by mouth daily.     metoprolol  succinate (TOPROL -XL) 50 MG 24 hr tablet TAKE 1 TABLET BY MOUTH DAILY 90 tablet 11   metoprolol  tartrate (LOPRESSOR ) 100 MG tablet Take 1 tablet (100 mg total) by mouth once for 1 dose. Take 90-120 minutes prior to scan. Hold  for SBP less than 110. 1 tablet 0   oxyCODONE -acetaminophen  (PERCOCET/ROXICET) 5-325 MG tablet Take 1-2 tablets by mouth every 8 (eight) hours as needed for severe pain. 6 tablet 0   prochlorperazine  (COMPAZINE ) 10 MG tablet Take 1 tablet (10 mg total) by mouth every 6 (six) hours as needed. 30 tablet 2   simvastatin  (ZOCOR ) 20 MG tablet Take 20 mg by mouth at bedtime.     sucralfate  (CARAFATE ) 1 g tablet Take 1 tablet (1 g total) by mouth 4 (four) times daily -  with meals and at bedtime. 120 tablet 0   No current  facility-administered medications for this visit.    SURGICAL HISTORY:  Past Surgical History:  Procedure Laterality Date   BREAST EXCISIONAL BIOPSY Right    x2   BRONCHIAL BIOPSY  08/30/2023   Procedure: BRONCHIAL BIOPSIES;  Surgeon: Brenna Adine CROME, DO;  Location: [facility];  Service: Pulmonary;;   EYE SURGERY     cataract bilateral    HERNIA REPAIR  06/2000   INCISIONAL VENTRAL ; DR GEORGETTE POLI    KNEE ARTHROSCOPY Left 08/23/2017   dr gerome    LAPAROSCOPIC CHOLECYSTECTOMY  06/13/2000   DR DOUG [facility]    LEFT HEART CATH AND CORONARY ANGIOGRAPHY N/A 12/04/2018   Procedure: LEFT HEART CATH AND CORONARY ANGIOGRAPHY;  Surgeon: Claudene Victory ORN, MD;  [facility]: [facility];  Service: Cardiovascular;  Laterality: N/A;   TOTAL KNEE ARTHROPLASTY Left 03/31/2018   Procedure: LEFT TOTAL KNEE ARTHROPLASTY;  Surgeon: gerome Charleston, MD;  Location: [facility];  Service: Orthopedics;  Laterality: Left;  with block   TOTAL KNEE ARTHROPLASTY Right 04/30/2020   Procedure: RIGHT TOTAL KNEE ARTHROPLASTY;  Surgeon: Melodi Lerner, MD;  Location: [facility];  Service: Orthopedics;  Laterality: Right;    VAGINAL HYSTERECTOMY  age 37s    REVIEW OF SYSTEMS:   Review of Systems  Constitutional: Stable fatigue. Negative for appetite change, chills, fever and unexpected weight change.  HENT: Positive for some post nasal drainage and mild hoarseness. Negative for mouth sores, nosebleeds, sore throat and trouble swallowing.   Eyes: Negative for eye problems and icterus.  Respiratory: stable dyspnea on exertion. Improved cough. Negative for hemoptysis wheezing.   Cardiovascular: Negative for chest pain and leg swelling.  Gastrointestinal: Negative for abdominal pain, constipation, diarrhea, nausea and vomiting.  Genitourinary: Negative for bladder incontinence, difficulty urinating, dysuria, frequency and hematuria.   Musculoskeletal: Negative for back pain, gait problem, neck pain and neck  stiffness.  Skin: Negative for itching and rash except for a small patch on her chin.  Neurological: Negative for dizziness, extremity weakness, gait problem, headaches, light-headedness and seizures.  Hematological: Waxing and waning left supraclavicular lymph node. Does not bruise/bleed easily.  Psychiatric/Behavioral: Negative for confusion, depression and sleep disturbance. The patient is not nervous/anxious.     PHYSICAL EXAMINATION:  There were no vitals taken for this visit.  ECOG PERFORMANCE STATUS: 1  Physical Exam  Constitutional: Oriented to person, place, and time and well-developed, well-nourished, and in no distress. HENT:  Head: Normocephalic and atraumatic.  Mouth/Throat: Oropharynx is clear and moist. No oropharyngeal exudate.  Eyes: Conjunctivae are normal. Right eye exhibits no discharge. Left eye exhibits no discharge. No scleral icterus.  Neck: Normal range of motion. Neck supple.  Cardiovascular: Normal rate, regular rhythm, normal heart sounds and intact distal pulses.   Pulmonary/Chest: Effort normal and breath sounds normal. No respiratory distress. No wheezes. No rales.  Abdominal: Soft. Bowel sounds are normal.  Exhibits no distension and no mass. There is no tenderness.  Musculoskeletal: Normal range of motion. Exhibits no edema.  Lymphadenopathy:    Positive for left supraclavicular adenopathy  Neurological: Alert and oriented to person, place, and time. Exhibits normal muscle tone. Gait normal. Coordination normal.  Skin: Skin is warm and dry. No rash noted. Not diaphoretic. No erythema. No pallor.  Psychiatric: Mood, memory and judgment normal.  Vitals reviewed.  LABORATORY DATA: Lab Results  Component Value Date   WBC 3.4 (L) 07/03/2024   HGB 13.6 07/03/2024   HCT 39.8 07/03/2024   MCV 86.5 07/03/2024   PLT 207 07/03/2024      Chemistry      Component Value Date/Time   NA 138 07/03/2024 0950   NA 140 03/07/2024 1600   K 3.3 (L) 07/03/2024  0950   CL 105 07/03/2024 0950   CO2 26 07/03/2024 0950   BUN 11 07/03/2024 0950   BUN 7 (L) 03/07/2024 1600   CREATININE 0.96 07/03/2024 0950      Component Value Date/Time   CALCIUM  9.8 07/03/2024 0950   ALKPHOS 88 07/03/2024 0950   AST 24 07/03/2024 0950   ALT 13 07/03/2024 0950   BILITOT 0.4 07/03/2024 0950       RADIOGRAPHIC STUDIES:  No results found.   ASSESSMENT/PLAN:  This is a very pleasant 83 year old African-American female diagnosed with Stage IIIC (T3, N3, M0) non-small cell lung cancer, adenocarcinoma presented with supraclavicular lymphadenopathy, 2 left lung nodules diagnosed in September 2024.  There is also a short segment of wall thickening involving the splenic flexure of the colon with associated mild hypermetabolism which is nonspecific and colonoscopy was recommended.  Molecular studies showed positive KRAS G12C mutation and PD-L1 expression of 5%. The patient completed a course of concurrent chemoradiation with weekly carboplatin  for AUC of 2 and paclitaxel  45 Mg/M2 started on September 20, 2023.  She is status post 6 week of treatment.  Last day chemotherapy was on 10/24/23.    She is currently on consolidation immunotherapy with Imfinzi  1500 mg IV every 4 weeks. She is status post 7 cycles.    The patient was seen with Dr. Sherrod today.  Dr. Sherrod personally and independently reviewed the scan and discussed results with the patient today.  The scan showed no evidence of disease progression.  Dr. Sherrod recommends she continue on the same treatment at the same dose.    Labs were reviewed. Recommend she proceed with cycle #8 today as scheduled.     We will see her back for labs and follow up in 4 weeks for evaluation and repeat blood work before undergoing cycle #9.    She is not interested in seeing ENT for the hoarseness.   - Use lotion to manage dry skin.  The patient was advised to call immediately if she has any concerning symptoms in the  interval. The patient voices understanding of current disease status and treatment options and is in agreement with the current care plan. All questions were answered. The patient knows to call the clinic with any problems, questions or concerns. We can certainly see the patient much sooner if necessary     No orders of the defined types were placed in this encounter.     Taijah Macrae L Shayan Bramhall, PA-C 07/26/24  ADDENDUM: Hematology/Oncology Attending: I had a face-to-face encounter with the patient today.  I reviewed her records, lab, scan and recommended her care plan.  This is a very pleasant 82  years old African-American female with a stage IIIc non-small cell lung cancer, adenocarcinoma diagnosed in September 2024 with positive KRAS G12C mutation and PD-L1 expression of 5%.  She completed a course of concurrent chemoradiation with weekly carboplatin  and paclitaxel  and she is currently on consolidation treatment with immunotherapy with Imfinzi  1500 Mg IV every 4 weeks status post 8 cycles.  The patient has been tolerating this treatment fairly well.  She had repeat CT scan of the chest performed recently.  I personally and independently reviewed the scan and discussed the results with the patient today.  Her scan showed no concerning findings for disease progression. I recommended for her to continue her current consolidation treatment with Imfinzi  and she will proceed with cycle #9 today. She will come back for follow-up visit in 4 weeks for evaluation before the next cycle of her treatment. The patient was advised to call immediately if she has any other concerning symptoms in the interval. The total time spent in the appointment was 30 minutes including review of chart and various tests results, discussions about plan of care and coordination of care plan . Disclaimer: This note was dictated with voice recognition software. Similar sounding words can inadvertently be transcribed and may  be missed upon review. Sherrod MARLA Sherrod, MD

## 2024-07-30 ENCOUNTER — Other Ambulatory Visit: Payer: Self-pay | Admitting: Medical Oncology

## 2024-07-30 DIAGNOSIS — R300 Dysuria: Secondary | ICD-10-CM

## 2024-07-31 ENCOUNTER — Inpatient Hospital Stay: Attending: Physician Assistant

## 2024-07-31 ENCOUNTER — Inpatient Hospital Stay

## 2024-07-31 ENCOUNTER — Inpatient Hospital Stay (HOSPITAL_BASED_OUTPATIENT_CLINIC_OR_DEPARTMENT_OTHER): Admitting: Physician Assistant

## 2024-07-31 VITALS — BP 142/79 | HR 72 | Temp 97.2°F | Resp 14 | Wt 170.3 lb

## 2024-07-31 DIAGNOSIS — Z79620 Long term (current) use of immunosuppressive biologic: Secondary | ICD-10-CM | POA: Insufficient documentation

## 2024-07-31 DIAGNOSIS — Z7982 Long term (current) use of aspirin: Secondary | ICD-10-CM | POA: Diagnosis not present

## 2024-07-31 DIAGNOSIS — C3412 Malignant neoplasm of upper lobe, left bronchus or lung: Secondary | ICD-10-CM

## 2024-07-31 DIAGNOSIS — Z923 Personal history of irradiation: Secondary | ICD-10-CM | POA: Diagnosis not present

## 2024-07-31 DIAGNOSIS — Z5112 Encounter for antineoplastic immunotherapy: Secondary | ICD-10-CM | POA: Insufficient documentation

## 2024-07-31 DIAGNOSIS — R5383 Other fatigue: Secondary | ICD-10-CM | POA: Diagnosis not present

## 2024-07-31 DIAGNOSIS — R0602 Shortness of breath: Secondary | ICD-10-CM | POA: Insufficient documentation

## 2024-07-31 DIAGNOSIS — R490 Dysphonia: Secondary | ICD-10-CM | POA: Diagnosis not present

## 2024-07-31 DIAGNOSIS — Z9049 Acquired absence of other specified parts of digestive tract: Secondary | ICD-10-CM | POA: Insufficient documentation

## 2024-07-31 DIAGNOSIS — I252 Old myocardial infarction: Secondary | ICD-10-CM | POA: Diagnosis not present

## 2024-07-31 DIAGNOSIS — Z90710 Acquired absence of both cervix and uterus: Secondary | ICD-10-CM | POA: Diagnosis not present

## 2024-07-31 DIAGNOSIS — R300 Dysuria: Secondary | ICD-10-CM

## 2024-07-31 DIAGNOSIS — Z79899 Other long term (current) drug therapy: Secondary | ICD-10-CM | POA: Diagnosis not present

## 2024-07-31 DIAGNOSIS — I10 Essential (primary) hypertension: Secondary | ICD-10-CM | POA: Insufficient documentation

## 2024-07-31 LAB — CMP (CANCER CENTER ONLY)
ALT: 11 U/L (ref 0–44)
AST: 20 U/L (ref 15–41)
Albumin: 4 g/dL (ref 3.5–5.0)
Alkaline Phosphatase: 91 U/L (ref 38–126)
Anion gap: 9 (ref 5–15)
BUN: 14 mg/dL (ref 8–23)
CO2: 25 mmol/L (ref 22–32)
Calcium: 10.1 mg/dL (ref 8.9–10.3)
Chloride: 104 mmol/L (ref 98–111)
Creatinine: 0.91 mg/dL (ref 0.44–1.00)
GFR, Estimated: >60 mL/min (ref >60)
Glucose, Bld: 133 mg/dL — ABNORMAL HIGH (ref 70–99)
Potassium: 3.3 mmol/L — ABNORMAL LOW (ref 3.5–5.1)
Sodium: 138 mmol/L (ref 135–145)
Total Bilirubin: 0.3 mg/dL (ref 0.0–1.2)
Total Protein: 7.3 g/dL (ref 6.5–8.1)

## 2024-07-31 LAB — CBC WITH DIFFERENTIAL (CANCER CENTER ONLY)
Abs Immature Granulocytes: 0.02 K/uL (ref 0.00–0.07)
Basophils Absolute: 0 K/uL (ref 0.0–0.1)
Basophils Relative: 1 %
Eosinophils Absolute: 0.2 K/uL (ref 0.0–0.5)
Eosinophils Relative: 4 %
HCT: 40.6 % (ref 36.0–46.0)
Hemoglobin: 14.1 g/dL (ref 12.0–15.0)
Immature Granulocytes: 0 %
Lymphocytes Relative: 12 %
Lymphs Abs: 0.6 K/uL — ABNORMAL LOW (ref 0.7–4.0)
MCH: 29.7 pg (ref 26.0–34.0)
MCHC: 34.7 g/dL (ref 30.0–36.0)
MCV: 85.7 fL (ref 80.0–100.0)
Monocytes Absolute: 0.5 K/uL (ref 0.1–1.0)
Monocytes Relative: 9 %
Neutro Abs: 3.7 K/uL (ref 1.7–7.7)
Neutrophils Relative %: 74 %
Platelet Count: 238 K/uL (ref 150–400)
RBC: 4.74 MIL/uL (ref 3.87–5.11)
RDW: 13.2 % (ref 11.5–15.5)
WBC Count: 5 K/uL (ref 4.0–10.5)
nRBC: 0 % (ref 0.0–0.2)

## 2024-07-31 LAB — URINALYSIS, COMPLETE (UACMP) WITH MICROSCOPIC
Bilirubin Urine: NEGATIVE
Glucose, UA: NEGATIVE mg/dL
Ketones, ur: NEGATIVE mg/dL
Nitrite: NEGATIVE
Protein, ur: NEGATIVE mg/dL
Specific Gravity, Urine: 1.01 (ref 1.005–1.030)
pH: 6 (ref 5.0–8.0)

## 2024-07-31 LAB — TSH: TSH: 3.97 u[IU]/mL (ref 0.350–4.500)

## 2024-07-31 MED ORDER — SODIUM CHLORIDE 0.9 % IV SOLN
1500.0000 mg | Freq: Once | INTRAVENOUS | Status: AC
  Administered 2024-07-31: 1500 mg via INTRAVENOUS
  Filled 2024-07-31: qty 30

## 2024-07-31 MED ORDER — SODIUM CHLORIDE 0.9 % IV SOLN: INTRAVENOUS | Status: DC

## 2024-07-31 NOTE — Patient Instructions (Signed)

## 2024-08-01 ENCOUNTER — Telehealth: Payer: Self-pay | Admitting: Medical Oncology

## 2024-08-01 ENCOUNTER — Other Ambulatory Visit: Payer: Self-pay | Admitting: Medical Oncology

## 2024-08-01 DIAGNOSIS — N390 Urinary tract infection, site not specified: Secondary | ICD-10-CM

## 2024-08-01 LAB — T4: T4, Total: 8.3 ug/dL (ref 4.5–12.0)

## 2024-08-01 MED ORDER — NITROFURANTOIN MONOHYD MACRO 100 MG PO CAPS: 100.0000 mg | ORAL_CAPSULE | Freq: Two times a day (BID) | ORAL | 0 refills | Status: AC

## 2024-08-01 NOTE — Telephone Encounter (Signed)
 Requests UA result    Today ,she is experiencing worsening pain with urination , frequency and now an odor to urine .

## 2024-08-01 NOTE — Telephone Encounter (Signed)
 Pt.notified

## 2024-08-28 ENCOUNTER — Inpatient Hospital Stay (HOSPITAL_BASED_OUTPATIENT_CLINIC_OR_DEPARTMENT_OTHER): Admitting: Internal Medicine

## 2024-08-28 ENCOUNTER — Inpatient Hospital Stay: Attending: Physician Assistant

## 2024-08-28 ENCOUNTER — Inpatient Hospital Stay

## 2024-08-28 VITALS — BP 122/80 | HR 81 | Temp 97.1°F | Resp 17 | Ht 65.0 in | Wt 174.1 lb

## 2024-08-28 DIAGNOSIS — Z79899 Other long term (current) drug therapy: Secondary | ICD-10-CM | POA: Insufficient documentation

## 2024-08-28 DIAGNOSIS — Z923 Personal history of irradiation: Secondary | ICD-10-CM | POA: Diagnosis not present

## 2024-08-28 DIAGNOSIS — E876 Hypokalemia: Secondary | ICD-10-CM | POA: Insufficient documentation

## 2024-08-28 DIAGNOSIS — C3412 Malignant neoplasm of upper lobe, left bronchus or lung: Secondary | ICD-10-CM | POA: Insufficient documentation

## 2024-08-28 DIAGNOSIS — Z90710 Acquired absence of both cervix and uterus: Secondary | ICD-10-CM | POA: Insufficient documentation

## 2024-08-28 DIAGNOSIS — Z713 Dietary counseling and surveillance: Secondary | ICD-10-CM | POA: Diagnosis not present

## 2024-08-28 DIAGNOSIS — Z7982 Long term (current) use of aspirin: Secondary | ICD-10-CM | POA: Diagnosis not present

## 2024-08-28 DIAGNOSIS — I252 Old myocardial infarction: Secondary | ICD-10-CM | POA: Insufficient documentation

## 2024-08-28 DIAGNOSIS — Z9049 Acquired absence of other specified parts of digestive tract: Secondary | ICD-10-CM | POA: Insufficient documentation

## 2024-08-28 DIAGNOSIS — Z5112 Encounter for antineoplastic immunotherapy: Secondary | ICD-10-CM | POA: Diagnosis not present

## 2024-08-28 DIAGNOSIS — Z9221 Personal history of antineoplastic chemotherapy: Secondary | ICD-10-CM | POA: Diagnosis not present

## 2024-08-28 DIAGNOSIS — I10 Essential (primary) hypertension: Secondary | ICD-10-CM | POA: Diagnosis not present

## 2024-08-28 LAB — CMP (CANCER CENTER ONLY)
ALT: 14 U/L (ref 0–44)
AST: 24 U/L (ref 15–41)
Albumin: 3.9 g/dL (ref 3.5–5.0)
Alkaline Phosphatase: 81 U/L (ref 38–126)
Anion gap: 6 (ref 5–15)
BUN: 7 mg/dL — ABNORMAL LOW (ref 8–23)
CO2: 27 mmol/L (ref 22–32)
Calcium: 9.7 mg/dL (ref 8.9–10.3)
Chloride: 107 mmol/L (ref 98–111)
Creatinine: 0.82 mg/dL (ref 0.44–1.00)
GFR, Estimated: >60 mL/min (ref >60)
Glucose, Bld: 85 mg/dL (ref 70–99)
Potassium: 3.2 mmol/L — ABNORMAL LOW (ref 3.5–5.1)
Sodium: 140 mmol/L (ref 135–145)
Total Bilirubin: 0.4 mg/dL (ref 0.0–1.2)
Total Protein: 6.9 g/dL (ref 6.5–8.1)

## 2024-08-28 LAB — CBC WITH DIFFERENTIAL (CANCER CENTER ONLY)
Abs Immature Granulocytes: 0.01 K/uL (ref 0.00–0.07)
Basophils Absolute: 0 K/uL (ref 0.0–0.1)
Basophils Relative: 1 %
Eosinophils Absolute: 0.2 K/uL (ref 0.0–0.5)
Eosinophils Relative: 5 %
HCT: 40.6 % (ref 36.0–46.0)
Hemoglobin: 13.8 g/dL (ref 12.0–15.0)
Immature Granulocytes: 0 %
Lymphocytes Relative: 20 %
Lymphs Abs: 0.6 K/uL — ABNORMAL LOW (ref 0.7–4.0)
MCH: 29.2 pg (ref 26.0–34.0)
MCHC: 34 g/dL (ref 30.0–36.0)
MCV: 86 fL (ref 80.0–100.0)
Monocytes Absolute: 0.5 K/uL (ref 0.1–1.0)
Monocytes Relative: 15 %
Neutro Abs: 1.9 K/uL (ref 1.7–7.7)
Neutrophils Relative %: 59 %
Platelet Count: 213 K/uL (ref 150–400)
RBC: 4.72 MIL/uL (ref 3.87–5.11)
RDW: 13.7 % (ref 11.5–15.5)
WBC Count: 3.2 K/uL — ABNORMAL LOW (ref 4.0–10.5)
nRBC: 0 % (ref 0.0–0.2)

## 2024-08-28 MED ORDER — SODIUM CHLORIDE 0.9 % IV SOLN: INTRAVENOUS | Status: DC

## 2024-08-28 MED ORDER — SODIUM CHLORIDE 0.9 % IV SOLN
1500.0000 mg | Freq: Once | INTRAVENOUS | Status: AC
  Administered 2024-08-28: 1500 mg via INTRAVENOUS
  Filled 2024-08-28: qty 30

## 2024-08-28 NOTE — Patient Instructions (Signed)

## 2024-08-28 NOTE — Progress Notes (Signed)
 [facility] [phone]   [phone]  OFFICE PROGRESS NOTE  [name] LABOR, MD [address] [place] [address]  DIAGNOSIS: Stage IIIC (T3, N3, M0) non-small cell lung cancer, adenocarcinoma presented with supraclavicular lymphadenopathy, 2 left lung nodules diagnosed in September 2024.  There is also a short segment of wall thickening involving the splenic flexure of the colon with associated mild hypermetabolism which is nonspecific and colonoscopy was recommended.   Biomarker Findings HRD signature - HRDsig Negative Microsatellite status - MS-Equivocal ? Tumor Mutational Burden - 2 Muts/Mb Genomic Findings For a complete list of the genes assayed, please refer to the Appendix. KRAS G12C TP53 R110L 7 Disease relevant genes with no reportable alterations: ALK, BRAF, EGFR, ERBB2, MET, RET, ROS1  PDL1 Expression 5%.  PRIOR THERAPY: Concurrent chemoradiation with carboplatin  for an AUC of 2 and taxol  45 mg/m2.  First dose September 20, 2023.  Status post 6 cycles.   CURRENT THERAPY: Consolidation treatment with Imfinzi  1500 Mg IV every 4 weeks status post 9 cycles.   INTERVAL HISTORY: Stephanie Newton 83 y.o. female returns to the clinic today for follow-up visit. Discussed the use of AI scribe software for clinical note transcription with the patient, who gave verbal consent to proceed.  History of Present Illness Stephanie Newton is an 83 year old female with stage III C non-small cell lung cancer who presents for evaluation before starting cycle ten of durvalumab .  She was diagnosed with stage III C non-small cell lung cancer, adenocarcinoma subtype, in September 2024, characterized by a positive KRAS G12C mutation and a PD-L1 expression of five percent.  She underwent concurrent chemoradiation with weekly carboplatin . Currently, she is on consolidation treatment with immunotherapy, receiving durvalumab  1500 mg IV  every four weeks. She has completed nine cycles and is here for evaluation before starting cycle number ten.  She feels 'pretty good' and has not experienced any nausea, vomiting, diarrhea, or headaches. She was informed that her potassium was a little low at 3.2 mmol/L and was advised to increase potassium in her diet.     MEDICAL HISTORY: Past Medical History:  Diagnosis Date   Arthritis    Dysrhythmia    Hypercholesteremia    Hypertension    Myocardial infarction [facility])     ALLERGIES:  is allergic to shellfish allergy.  MEDICATIONS:  Current Outpatient Medications  Medication Sig Dispense Refill   amLODipine (NORVASC) 2.5 MG tablet Take 2.5 mg by mouth daily.     amoxicillin -clavulanate (AUGMENTIN ) 875-125 MG tablet Take 1 tablet by mouth every 12 (twelve) hours. 14 tablet 0   aspirin  EC 81 MG tablet Take 1 tablet by mouth daily.     doxycycline  (VIBRAMYCIN ) 100 MG capsule Take 1 capsule (100 mg total) by mouth 2 (two) times daily. 20 capsule 0   estradiol  (ESTRACE ) 1 MG tablet Take 1 mg by mouth daily.     famotidine  (PEPCID ) 20 MG tablet Take 20 mg by mouth as needed.     hydrochlorothiazide  (HYDRODIURIL ) 12.5 MG tablet Take 12.5 mg by mouth daily.      methocarbamol  (ROBAXIN ) 500 MG tablet Take 1 tablet (500 mg total) by mouth every 6 (six) hours as needed for muscle spasms. 40 tablet 0   metoprolol  succinate (TOPROL -XL) 100 MG 24 hr tablet Take 100 mg by mouth daily.     metoprolol  succinate (TOPROL -XL) 50 MG 24 hr tablet TAKE 1 TABLET BY MOUTH DAILY 90  tablet 11   metoprolol  tartrate (LOPRESSOR ) 100 MG tablet Take 1 tablet (100 mg total) by mouth once for 1 dose. Take 90-120 minutes prior to scan. Hold for SBP less than 110. 1 tablet 0   nitrofurantoin , macrocrystal-monohydrate, (MACROBID ) 100 MG capsule Take 1 capsule (100 mg total) by mouth 2 (two) times daily. 14 capsule 0   oxyCODONE -acetaminophen  (PERCOCET/ROXICET) 5-325 MG tablet Take 1-2 tablets by mouth every 8  (eight) hours as needed for severe pain. 6 tablet 0   prochlorperazine  (COMPAZINE ) 10 MG tablet Take 1 tablet (10 mg total) by mouth every 6 (six) hours as needed. 30 tablet 2   simvastatin  (ZOCOR ) 20 MG tablet Take 20 mg by mouth at bedtime.     sucralfate  (CARAFATE ) 1 g tablet Take 1 tablet (1 g total) by mouth 4 (four) times daily -  with meals and at bedtime. 120 tablet 0   Vitamin D, Ergocalciferol, (DRISDOL) 1.25 MG (50000 UNIT) CAPS capsule Take 50,000 Units by mouth once a week.     No current facility-administered medications for this visit.    SURGICAL HISTORY:  Past Surgical History:  Procedure Laterality Date   BREAST EXCISIONAL BIOPSY Right    x2   BRONCHIAL BIOPSY  08/30/2023   Procedure: BRONCHIAL BIOPSIES;  Surgeon: Brenna Adine CROME, DO;  Location: [facility];  Service: Pulmonary;;   EYE SURGERY     cataract bilateral    HERNIA REPAIR  06/2000   INCISIONAL VENTRAL ; DR GEORGETTE POLI    KNEE ARTHROSCOPY Left 08/23/2017   dr gerome    LAPAROSCOPIC CHOLECYSTECTOMY  06/13/2000   DR DOUG [facility]    LEFT HEART CATH AND CORONARY ANGIOGRAPHY N/A 12/04/2018   Procedure: LEFT HEART CATH AND CORONARY ANGIOGRAPHY;  Surgeon: Claudene Victory ORN, MD;  [facility];  Service: Cardiovascular;  Laterality: N/A;   TOTAL KNEE ARTHROPLASTY Left 03/31/2018   Procedure: LEFT TOTAL KNEE ARTHROPLASTY;  Surgeon: gerome Charleston, MD;  Location: [facility];  Service: Orthopedics;  Laterality: Left;  with block   TOTAL KNEE ARTHROPLASTY Right 04/30/2020   Procedure: RIGHT TOTAL KNEE ARTHROPLASTY;  Surgeon: Melodi Lerner, MD;  Location: [facility];  Service: Orthopedics;  Laterality: Right;    VAGINAL HYSTERECTOMY  age 40s    REVIEW OF SYSTEMS:  A comprehensive review of systems was negative.   PHYSICAL EXAMINATION: General appearance: alert, cooperative, and no distress Head: Normocephalic, without obvious abnormality, atraumatic Neck: no adenopathy, no JVD, supple, symmetrical,  trachea midline, and thyroid  not enlarged, symmetric, no tenderness/mass/nodules Lymph nodes: Cervical, supraclavicular, and axillary nodes normal. Resp: clear to auscultation bilaterally Back: symmetric, no curvature. ROM normal. No CVA tenderness. Cardio: regular rate and rhythm, S1, S2 normal, no murmur, click, rub or gallop GI: soft, non-tender; bowel sounds normal; no masses,  no organomegaly Extremities: extremities normal, atraumatic, no cyanosis or edema  ECOG PERFORMANCE STATUS: 1 - Symptomatic but completely ambulatory  Blood pressure 122/80, pulse 81, temperature (!) 97.1 F (36.2 C), resp. rate 17, height 5' 5 (1.651 m), weight 174 lb 1.6 oz (79 kg), SpO2 97%.  LABORATORY DATA: Lab Results  Component Value Date   WBC 3.2 (L) 08/28/2024   HGB 13.8 08/28/2024   HCT 40.6 08/28/2024   MCV 86.0 08/28/2024   PLT 213 08/28/2024      Chemistry      Component Value Date/Time   NA 140 08/28/2024 1000   NA 140 03/07/2024 1600   K 3.2 (L) 08/28/2024 1000  CL 107 08/28/2024 1000   CO2 27 08/28/2024 1000   BUN 7 (L) 08/28/2024 1000   BUN 7 (L) 03/07/2024 1600   CREATININE 0.82 08/28/2024 1000      Component Value Date/Time   CALCIUM  9.7 08/28/2024 1000   ALKPHOS 81 08/28/2024 1000   AST 24 08/28/2024 1000   ALT 14 08/28/2024 1000   BILITOT 0.4 08/28/2024 1000       RADIOGRAPHIC STUDIES: No results found.   ASSESSMENT AND PLAN: This is a very pleasant 83 years old African-American female diagnosed with Stage IIIC (T3, N3, M0) non-small cell lung cancer, adenocarcinoma presented with supraclavicular lymphadenopathy, 2 left lung nodules diagnosed in September 2024.  There is also a short segment of wall thickening involving the splenic flexure of the colon with associated mild hypermetabolism which is nonspecific and colonoscopy was recommended.  Molecular studies showed positive KRAS G12C mutation and PD-L1 expression of 5%. The patient completed a course of  concurrent chemoradiation with weekly carboplatin  for AUC of 2 and paclitaxel  45 Mg/M2 started on September 20, 2023.  She is status post 6 week of treatment. The patient is currently undergoing consolidation treatment with immunotherapy with Imfinzi  1500 Mg IV every 4 weeks status post 9 cycles.  The patient has been tolerating this treatment fairly well. Assessment and Plan Assessment & Plan Stage IIIc lung adenocarcinoma with KRAS G12C mutation Currently undergoing consolidation treatment with durvalumab . She has completed nine cycles and is preparing for the tenth cycle. She reports feeling well with no nausea, vomiting, diarrhea, or headaches. The treatment is well-tolerated, and she is expected to complete three more cycles after the current one. - Administer durvalumab  1500 mg IV for cycle ten of immunotherapy - Schedule next cycle of immunotherapy in four weeks  Hypokalemia Mild hypokalemia with a potassium level of 3.2 mmol/L. No symptoms reported. - Increase dietary potassium intake with foods such as bananas and orange juice She was advised to call immediately if she has any other concerning symptoms in the interval.   The patient voices understanding of current disease status and treatment options and is in agreement with the current care plan.  All questions were answered. The patient knows to call the clinic with any problems, questions or concerns. We can certainly see the patient much sooner if necessary.  The total time spent in the appointment was 20 minutes.  Disclaimer: This note was dictated with voice recognition software. Similar sounding words can inadvertently be transcribed and may not be corrected upon review.

## 2024-09-12 DIAGNOSIS — I2541 Coronary artery aneurysm: Secondary | ICD-10-CM | POA: Diagnosis not present

## 2024-09-12 DIAGNOSIS — R7303 Prediabetes: Secondary | ICD-10-CM | POA: Diagnosis not present

## 2024-09-12 DIAGNOSIS — K760 Fatty (change of) liver, not elsewhere classified: Secondary | ICD-10-CM | POA: Diagnosis not present

## 2024-09-12 DIAGNOSIS — I10 Essential (primary) hypertension: Secondary | ICD-10-CM | POA: Diagnosis not present

## 2024-09-12 DIAGNOSIS — C3492 Malignant neoplasm of unspecified part of left bronchus or lung: Secondary | ICD-10-CM | POA: Diagnosis not present

## 2024-09-12 DIAGNOSIS — I2510 Atherosclerotic heart disease of native coronary artery without angina pectoris: Secondary | ICD-10-CM | POA: Diagnosis not present

## 2024-09-25 ENCOUNTER — Inpatient Hospital Stay (HOSPITAL_BASED_OUTPATIENT_CLINIC_OR_DEPARTMENT_OTHER): Admitting: Internal Medicine

## 2024-09-25 ENCOUNTER — Inpatient Hospital Stay

## 2024-09-25 ENCOUNTER — Inpatient Hospital Stay: Attending: Physician Assistant

## 2024-09-25 VITALS — BP 130/70 | HR 70 | Temp 97.6°F | Resp 17 | Ht 65.0 in | Wt 172.0 lb

## 2024-09-25 DIAGNOSIS — Z9049 Acquired absence of other specified parts of digestive tract: Secondary | ICD-10-CM | POA: Insufficient documentation

## 2024-09-25 DIAGNOSIS — Z9221 Personal history of antineoplastic chemotherapy: Secondary | ICD-10-CM | POA: Insufficient documentation

## 2024-09-25 DIAGNOSIS — Z79899 Other long term (current) drug therapy: Secondary | ICD-10-CM | POA: Insufficient documentation

## 2024-09-25 DIAGNOSIS — Z79620 Long term (current) use of immunosuppressive biologic: Secondary | ICD-10-CM | POA: Diagnosis not present

## 2024-09-25 DIAGNOSIS — C3412 Malignant neoplasm of upper lobe, left bronchus or lung: Secondary | ICD-10-CM

## 2024-09-25 DIAGNOSIS — Z90710 Acquired absence of both cervix and uterus: Secondary | ICD-10-CM | POA: Insufficient documentation

## 2024-09-25 DIAGNOSIS — Z5112 Encounter for antineoplastic immunotherapy: Secondary | ICD-10-CM | POA: Diagnosis not present

## 2024-09-25 DIAGNOSIS — M1990 Unspecified osteoarthritis, unspecified site: Secondary | ICD-10-CM | POA: Insufficient documentation

## 2024-09-25 DIAGNOSIS — I10 Essential (primary) hypertension: Secondary | ICD-10-CM | POA: Diagnosis not present

## 2024-09-25 DIAGNOSIS — I252 Old myocardial infarction: Secondary | ICD-10-CM | POA: Diagnosis not present

## 2024-09-25 DIAGNOSIS — E7800 Pure hypercholesterolemia, unspecified: Secondary | ICD-10-CM | POA: Insufficient documentation

## 2024-09-25 DIAGNOSIS — Z79818 Long term (current) use of other agents affecting estrogen receptors and estrogen levels: Secondary | ICD-10-CM | POA: Insufficient documentation

## 2024-09-25 DIAGNOSIS — Z7982 Long term (current) use of aspirin: Secondary | ICD-10-CM | POA: Insufficient documentation

## 2024-09-25 DIAGNOSIS — Z923 Personal history of irradiation: Secondary | ICD-10-CM | POA: Diagnosis not present

## 2024-09-25 LAB — CBC WITH DIFFERENTIAL (CANCER CENTER ONLY)
Abs Immature Granulocytes: 0.01 K/uL (ref 0.00–0.07)
Basophils Absolute: 0 K/uL (ref 0.0–0.1)
Basophils Relative: 1 %
Eosinophils Absolute: 0.2 K/uL (ref 0.0–0.5)
Eosinophils Relative: 5 %
HCT: 40.5 % (ref 36.0–46.0)
Hemoglobin: 13.8 g/dL (ref 12.0–15.0)
Immature Granulocytes: 0 %
Lymphocytes Relative: 18 %
Lymphs Abs: 0.6 K/uL — ABNORMAL LOW (ref 0.7–4.0)
MCH: 29.4 pg (ref 26.0–34.0)
MCHC: 34.1 g/dL (ref 30.0–36.0)
MCV: 86.4 fL (ref 80.0–100.0)
Monocytes Absolute: 0.4 K/uL (ref 0.1–1.0)
Monocytes Relative: 12 %
Neutro Abs: 2.1 K/uL (ref 1.7–7.7)
Neutrophils Relative %: 64 %
Platelet Count: 228 K/uL (ref 150–400)
RBC: 4.69 MIL/uL (ref 3.87–5.11)
RDW: 13.9 % (ref 11.5–15.5)
WBC Count: 3.4 K/uL — ABNORMAL LOW (ref 4.0–10.5)
nRBC: 0 % (ref 0.0–0.2)

## 2024-09-25 LAB — CMP (CANCER CENTER ONLY)
ALT: 13 U/L (ref 0–44)
AST: 26 U/L (ref 15–41)
Albumin: 3.9 g/dL (ref 3.5–5.0)
Alkaline Phosphatase: 79 U/L (ref 38–126)
Anion gap: 8 (ref 5–15)
BUN: 12 mg/dL (ref 8–23)
CO2: 26 mmol/L (ref 22–32)
Calcium: 10.6 mg/dL — ABNORMAL HIGH (ref 8.9–10.3)
Chloride: 106 mmol/L (ref 98–111)
Creatinine: 0.87 mg/dL (ref 0.44–1.00)
GFR, Estimated: >60 mL/min (ref >60)
Glucose, Bld: 100 mg/dL — ABNORMAL HIGH (ref 70–99)
Potassium: 3.5 mmol/L (ref 3.5–5.1)
Sodium: 140 mmol/L (ref 135–145)
Total Bilirubin: 0.4 mg/dL (ref 0.0–1.2)
Total Protein: 7.1 g/dL (ref 6.5–8.1)

## 2024-09-25 MED ORDER — SODIUM CHLORIDE 0.9 % IV SOLN
1500.0000 mg | Freq: Once | INTRAVENOUS | Status: AC
  Administered 2024-09-25: 1500 mg via INTRAVENOUS
  Filled 2024-09-25: qty 30

## 2024-09-25 MED ORDER — SODIUM CHLORIDE 0.9 % IV SOLN: INTRAVENOUS | Status: DC

## 2024-09-25 NOTE — Progress Notes (Signed)
 [facility] [phone]   [phone]  OFFICE PROGRESS NOTE  [name] LABOR, MD [address] [place] [address]  DIAGNOSIS: Stage IIIC (T3, N3, M0) non-small cell lung cancer, adenocarcinoma presented with supraclavicular lymphadenopathy, 2 left lung nodules diagnosed in September 2024.  There is also a short segment of wall thickening involving the splenic flexure of the colon with associated mild hypermetabolism which is nonspecific and colonoscopy was recommended.   Biomarker Findings HRD signature - HRDsig Negative Microsatellite status - MS-Equivocal ? Tumor Mutational Burden - 2 Muts/Mb Genomic Findings For a complete list of the genes assayed, please refer to the Appendix. KRAS G12C TP53 R110L 7 Disease relevant genes with no reportable alterations: ALK, BRAF, EGFR, ERBB2, MET, RET, ROS1  PDL1 Expression 5%.  PRIOR THERAPY: Concurrent chemoradiation with carboplatin  for an AUC of 2 and taxol  45 mg/m2.  First dose September 20, 2023.  Status post 6 cycles.   CURRENT THERAPY: Consolidation treatment with Imfinzi  1500 Mg IV every 4 weeks status post 10 cycles.   INTERVAL HISTORY: Stephanie Newton 83 y.o. female returns to the clinic today for follow-up visit. Discussed the use of AI scribe software for clinical note transcription with the patient, who gave verbal consent to proceed.  History of Present Illness Stephanie Newton is an 83 year old female with stage III C non-small cell lung cancer who presents for evaluation to start cycle eleven of immunotherapy.  She was diagnosed with stage III C non-small cell lung cancer, adenocarcinoma, in September 2024, with a positive KRSG12C mutation and a PD-L1 expression of five percent. She completed concurrent chemoradiation and is currently undergoing consolidation treatment with durvalumab  every four weeks.  She feels 'pretty good' overall but experiences persistent  low energy levels, which she attributes to her age and possibly needing more exercise. Her arthritis symptoms have worsened, impacting her daily activities as she feels worn out after completing them.  Her potassium levels were previously low, but she has been increasing her intake. Her current level is 3.5, which is within the normal range.     MEDICAL HISTORY: Past Medical History:  Diagnosis Date   Arthritis    Dysrhythmia    Hypercholesteremia    Hypertension    Myocardial infarction [facility])     ALLERGIES:  is allergic to shellfish allergy.  MEDICATIONS:  Current Outpatient Medications  Medication Sig Dispense Refill   amLODipine (NORVASC) 2.5 MG tablet Take 2.5 mg by mouth daily.     aspirin  EC 81 MG tablet Take 1 tablet by mouth daily.     estradiol  (ESTRACE ) 1 MG tablet Take 1 mg by mouth daily.     famotidine  (PEPCID ) 20 MG tablet Take 20 mg by mouth as needed.     hydrochlorothiazide  (HYDRODIURIL ) 12.5 MG tablet Take 12.5 mg by mouth daily.      metoprolol  succinate (TOPROL -XL) 100 MG 24 hr tablet Take 100 mg by mouth daily.     metoprolol  succinate (TOPROL -XL) 50 MG 24 hr tablet TAKE 1 TABLET BY MOUTH DAILY 90 tablet 11   metoprolol  tartrate (LOPRESSOR ) 100 MG tablet Take 1 tablet (100 mg total) by mouth once for 1 dose. Take 90-120 minutes prior to scan. Hold for SBP less than 110. 1 tablet 0   simvastatin  (ZOCOR ) 20 MG tablet Take 20 mg by mouth at bedtime.     methocarbamol  (ROBAXIN ) 500 MG tablet Take 1 tablet (500 mg total) by  mouth every 6 (six) hours as needed for muscle spasms. 40 tablet 0   nitrofurantoin , macrocrystal-monohydrate, (MACROBID ) 100 MG capsule Take 1 capsule (100 mg total) by mouth 2 (two) times daily. 14 capsule 0   oxyCODONE -acetaminophen  (PERCOCET/ROXICET) 5-325 MG tablet Take 1-2 tablets by mouth every 8 (eight) hours as needed for severe pain. 6 tablet 0   prochlorperazine  (COMPAZINE ) 10 MG tablet Take 1 tablet (10 mg total) by mouth every 6 (six)  hours as needed. 30 tablet 2   sucralfate  (CARAFATE ) 1 g tablet Take 1 tablet (1 g total) by mouth 4 (four) times daily -  with meals and at bedtime. 120 tablet 0   Vitamin D, Ergocalciferol, (DRISDOL) 1.25 MG (50000 UNIT) CAPS capsule Take 50,000 Units by mouth once a week.     No current facility-administered medications for this visit.    SURGICAL HISTORY:  Past Surgical History:  Procedure Laterality Date   BREAST EXCISIONAL BIOPSY Right    x2   BRONCHIAL BIOPSY  08/30/2023   Procedure: BRONCHIAL BIOPSIES;  Surgeon: Brenna Adine CROME, DO;  [facility]: [facility];  Service: Pulmonary;;   EYE SURGERY     cataract bilateral    HERNIA REPAIR  06/2000   INCISIONAL VENTRAL ; DR GEORGETTE POLI    KNEE ARTHROSCOPY Left 08/23/2017   dr gerome    LAPAROSCOPIC CHOLECYSTECTOMY  06/13/2000   DR DOUG [facility]    LEFT HEART CATH AND CORONARY ANGIOGRAPHY N/A 12/04/2018   Procedure: LEFT HEART CATH AND CORONARY ANGIOGRAPHY;  Surgeon: Claudene Victory ORN, MD;  [facility]: [facility];  Service: Cardiovascular;  Laterality: N/A;   TOTAL KNEE ARTHROPLASTY Left 03/31/2018   Procedure: LEFT TOTAL KNEE ARTHROPLASTY;  Surgeon: gerome Charleston, MD;  Location: [facility];  Service: Orthopedics;  Laterality: Left;  with block   TOTAL KNEE ARTHROPLASTY Right 04/30/2020   Procedure: RIGHT TOTAL KNEE ARTHROPLASTY;  Surgeon: Melodi Lerner, MD;  Location: [facility];  Service: Orthopedics;  Laterality: Right;    VAGINAL HYSTERECTOMY  age 57s    REVIEW OF SYSTEMS:  A comprehensive review of systems was negative except for: Constitutional: positive for fatigue Musculoskeletal: positive for arthralgias   PHYSICAL EXAMINATION: General appearance: alert, cooperative, fatigued, and no distress Head: Normocephalic, without obvious abnormality, atraumatic Neck: no adenopathy, no JVD, supple, symmetrical, trachea midline, and thyroid  not enlarged, symmetric, no tenderness/mass/nodules Lymph nodes: Cervical,  supraclavicular, and axillary nodes normal. Resp: clear to auscultation bilaterally Back: symmetric, no curvature. ROM normal. No CVA tenderness. Cardio: regular rate and rhythm, S1, S2 normal, no murmur, click, rub or gallop GI: soft, non-tender; bowel sounds normal; no masses,  no organomegaly Extremities: extremities normal, atraumatic, no cyanosis or edema  ECOG PERFORMANCE STATUS: 1 - Symptomatic but completely ambulatory  Blood pressure 130/70, pulse 70, resp. rate 17, height 5' 5 (1.651 m), SpO2 97%.  LABORATORY DATA: Lab Results  Component Value Date   WBC 3.4 (L) 09/25/2024   HGB 13.8 09/25/2024   HCT 40.5 09/25/2024   MCV 86.4 09/25/2024   PLT 228 09/25/2024      Chemistry      Component Value Date/Time   NA 140 09/25/2024 0941   NA 140 03/07/2024 1600   K 3.5 09/25/2024 0941   CL 106 09/25/2024 0941   CO2 26 09/25/2024 0941   BUN 12 09/25/2024 0941   BUN 7 (L) 03/07/2024 1600   CREATININE 0.87 09/25/2024 0941      Component Value Date/Time   CALCIUM  10.6 (H) 09/25/2024  0941   ALKPHOS 79 09/25/2024 0941   AST 26 09/25/2024 0941   ALT 13 09/25/2024 0941   BILITOT 0.4 09/25/2024 0941       RADIOGRAPHIC STUDIES: No results found.   ASSESSMENT AND PLAN: This is a very pleasant 83 years old African-American female diagnosed with Stage IIIC (T3, N3, M0) non-small cell lung cancer, adenocarcinoma presented with supraclavicular lymphadenopathy, 2 left lung nodules diagnosed in September 2024.  There is also a short segment of wall thickening involving the splenic flexure of the colon with associated mild hypermetabolism which is nonspecific and colonoscopy was recommended.  Molecular studies showed positive KRAS G12C mutation and PD-L1 expression of 5%. The patient completed a course of concurrent chemoradiation with weekly carboplatin  for AUC of 2 and paclitaxel  45 Mg/M2 started on September 20, 2023.  She is status post 6 week of treatment. The patient is  currently undergoing consolidation treatment with immunotherapy with Imfinzi  1500 Mg IV every 4 weeks status post 10 cycles.  The patient has been tolerating this treatment fairly well. Assessment and Plan Assessment & Plan Stage IIIC non-small cell lung cancer, adenocarcinoma with KRAS G12C mutation and PD-L1 expression, undergoing consolidation immunotherapy Stage IIIC non-small cell lung cancer, adenocarcinoma with KRAS G12C mutation and PD-L1 expression of 5%. Currently undergoing consolidation treatment with durvalumab , status post ten cycles. No new complaints or concerning findings on blood work. Potassium levels have improved to 3.5. Treatment is progressing well with two cycles remaining. - Continue durvalumab  every four weeks for two more cycles. - Will schedule follow-up scan at the end of treatment to assess for cancer growth. - If scan is clear, will schedule follow-up visits every three months with scans, then extend to every four to six months based on stability.  Fatigue Persistent fatigue, possibly related to age and ongoing cancer treatment. No new symptoms reported. - Encouraged increased physical activity as tolerated.  Osteoarthritis Worsening symptoms reported.  Resolved hypokalemia Previous hypokalemia resolved with dietary adjustments. Current potassium level is 3.5. - Continue dietary adjustments to maintain potassium levels. She was advised to call immediately if she has any concerning symptoms in the interval.  The patient voices understanding of current disease status and treatment options and is in agreement with the current care plan.  All questions were answered. The patient knows to call the clinic with any problems, questions or concerns. We can certainly see the patient much sooner if necessary.  The total time spent in the appointment was 20 minutes.  Disclaimer: This note was dictated with voice recognition software. Similar sounding words can  inadvertently be transcribed and may not be corrected upon review.

## 2024-09-25 NOTE — Patient Instructions (Signed)

## 2024-09-27 ENCOUNTER — Telehealth: Payer: Self-pay | Admitting: *Deleted

## 2024-09-27 NOTE — Telephone Encounter (Signed)
 PC to patient, no answer, left VM - informed patient or MRI & MD phone visit dates & times.  Instructed to contact this office with any questions.

## 2024-10-02 ENCOUNTER — Other Ambulatory Visit: Payer: Self-pay

## 2024-10-06 ENCOUNTER — Other Ambulatory Visit: Payer: Self-pay

## 2024-10-18 ENCOUNTER — Ambulatory Visit (HOSPITAL_COMMUNITY)
Admission: RE | Admit: 2024-10-18 | Discharge: 2024-10-18 | Disposition: A | Discharge status: Home / Self Care | Source: Ambulatory Visit | Attending: Internal Medicine | Admitting: Internal Medicine

## 2024-10-18 DIAGNOSIS — D329 Benign neoplasm of meninges, unspecified: Secondary | ICD-10-CM | POA: Insufficient documentation

## 2024-10-18 MED ORDER — GADOBUTROL 1 MMOL/ML IV SOLN
7.0000 mL | Freq: Once | INTRAVENOUS | Status: AC | PRN
  Administered 2024-10-18: 7 mL via INTRAVENOUS

## 2024-10-19 NOTE — Progress Notes (Signed)
 [facility] OFFICE PROGRESS NOTE  [name] LABOR, MD [address] [place] [address]  DIAGNOSIS: Stage IIIC (T3, N3, M0) non-small cell lung cancer, adenocarcinoma presented with supraclavicular lymphadenopathy, 2 left lung nodules diagnosed in September 2024.  There is also a short segment of wall thickening involving the splenic flexure of the colon with associated mild hypermetabolism which is nonspecific and colonoscopy was recommended.    Biomarker Findings HRD signature - HRDsig Negative Microsatellite status - MS-Equivocal ? Tumor Mutational Burden - 2 Muts/Mb Genomic Findings For a complete list of the genes assayed, please refer to the Appendix. KRAS G12C TP53 R110L 7 Disease relevant genes with no reportable alterations: ALK, BRAF, EGFR, ERBB2, MET, RET, ROS1   PDL1 Expression 5%.  PRIOR THERAPY: Concurrent chemoradiation with carboplatin  for an AUC of 2 and taxol  45 mg/m2. Last dose on 10/24/23. Status post 6 cycles.   CURRENT THERAPY:  Consolidation treatment with Imfinzi  1500 Mg IV every 4 weeks status post 11 cycles.   INTERVAL HISTORY: Stephanie Newton 83 y.o. female returns to the clinic today for a follow-up visit. The patient was last seen on 09/25/24. She is currently on immunotherapy and tolerating it well.    She denies fever, chills, or hemoptysis. She experiences shortness of breath but notes it has not worsened. She is not coughing very much. She denies rashes.  No nausea, vomiting, diarrhea, or constipation.  She denies headache or vision changes. She still has some voice hoarseness but is not interested in seeing ENT. Her hoarseness comes and goes. Her calcium  is a little elevated. She denies multivitamins or excessive intake of calcium  rich food. She takes hydrochlorothiazide . She has some left sided supraclavicular lymphadenopathy which is what led to her diagnosis in the first place that waxes and wanes in size. She  experiences swelling in her ankle, which she attributes to arthritis. The swelling worsens with activity and improves in the morning. No calf pain is reported.  She is here for evaluation and repeat blood work before undergoing cycle #12  MEDICAL HISTORY: Past Medical History:  Diagnosis Date   Arthritis    Dysrhythmia    Hypercholesteremia    Hypertension    Myocardial infarction (HCC)     ALLERGIES:  is allergic to shellfish allergy.  MEDICATIONS:  Current Outpatient Medications  Medication Sig Dispense Refill   amLODipine (NORVASC) 2.5 MG tablet Take 2.5 mg by mouth daily.     aspirin  EC 81 MG tablet Take 1 tablet by mouth daily.     estradiol  (ESTRACE ) 1 MG tablet Take 1 mg by mouth daily.     famotidine  (PEPCID ) 20 MG tablet Take 20 mg by mouth as needed.     hydrochlorothiazide  (HYDRODIURIL ) 12.5 MG tablet Take 12.5 mg by mouth daily.      methocarbamol  (ROBAXIN ) 500 MG tablet Take 1 tablet (500 mg total) by mouth every 6 (six) hours as needed for muscle spasms. 40 tablet 0   metoprolol  succinate (TOPROL -XL) 100 MG 24 hr tablet Take 100 mg by mouth daily.     metoprolol  succinate (TOPROL -XL) 50 MG 24 hr tablet TAKE 1 TABLET BY MOUTH DAILY 90 tablet 11   metoprolol  tartrate (LOPRESSOR ) 100 MG tablet Take 1 tablet (100 mg total) by mouth once for 1 dose. Take 90-120 minutes prior to scan. Hold for SBP less than 110. 1 tablet 0   nitrofurantoin , macrocrystal-monohydrate, (MACROBID ) 100 MG capsule Take 1 capsule (100 mg total) by mouth  2 (two) times daily. 14 capsule 0   oxyCODONE -acetaminophen  (PERCOCET/ROXICET) 5-325 MG tablet Take 1-2 tablets by mouth every 8 (eight) hours as needed for severe pain. 6 tablet 0   prochlorperazine  (COMPAZINE ) 10 MG tablet Take 1 tablet (10 mg total) by mouth every 6 (six) hours as needed. 30 tablet 2   simvastatin  (ZOCOR ) 20 MG tablet Take 20 mg by mouth at bedtime.     sucralfate  (CARAFATE ) 1 g tablet Take 1 tablet (1 g total) by mouth 4 (four)  times daily -  with meals and at bedtime. 120 tablet 0   Vitamin D, Ergocalciferol, (DRISDOL) 1.25 MG (50000 UNIT) CAPS capsule Take 50,000 Units by mouth once a week.     No current facility-administered medications for this visit.    SURGICAL HISTORY:  Past Surgical History:  Procedure Laterality Date   BREAST EXCISIONAL BIOPSY Right    x2   BRONCHIAL BIOPSY  08/30/2023   Procedure: BRONCHIAL BIOPSIES;  Surgeon: Brenna Adine CROME, DO;  Location: [facility];  Service: Pulmonary;;   EYE SURGERY     cataract bilateral    HERNIA REPAIR  06/2000   INCISIONAL VENTRAL ; DR GEORGETTE POLI    KNEE ARTHROSCOPY Left 08/23/2017   dr gerome    LAPAROSCOPIC CHOLECYSTECTOMY  06/13/2000   DR DOUG [place]    LEFT HEART CATH AND CORONARY ANGIOGRAPHY N/A 12/04/2018   Procedure: LEFT HEART CATH AND CORONARY ANGIOGRAPHY;  Surgeon: Claudene Victory ORN, MD;  [facility];  Service: Cardiovascular;  Laterality: N/A;   TOTAL KNEE ARTHROPLASTY Left 03/31/2018   Procedure: LEFT TOTAL KNEE ARTHROPLASTY;  Surgeon: Gerome Charleston, MD;  Location: [facility];  Service: Orthopedics;  Laterality: Left;  with block   TOTAL KNEE ARTHROPLASTY Right 04/30/2020   Procedure: RIGHT TOTAL KNEE ARTHROPLASTY;  Surgeon: Melodi Lerner, MD;  Location: [facility];  Service: Orthopedics;  Laterality: Right;    VAGINAL HYSTERECTOMY  age 85s    REVIEW OF SYSTEMS:   Constitutional: Stable fatigue. Negative for appetite change, chills, fever and unexpected weight change.  HENT: Positive for intermittent hoarseness. Negative for mouth sores, nosebleeds, sore throat and trouble swallowing.   Eyes: Negative for eye problems and icterus.  Respiratory: Stable dyspnea on exertion. Improved cough. Negative for hemoptysis wheezing.   Cardiovascular: Negative for chest pain. Mild intermittent ankle swelling.  Gastrointestinal: Negative for abdominal pain, constipation, diarrhea, nausea and vomiting.  Genitourinary: Negative  for bladder incontinence, difficulty urinating, dysuria, frequency and hematuria.   Musculoskeletal: Negative for back pain, gait problem, neck pain and neck stiffness.  Skin: Negative for itching and rash.  Neurological: Negative for dizziness, extremity weakness, gait problem, headaches, light-headedness and seizures.  Hematological: Waxing and waning left supraclavicular lymph node. Does not bruise/bleed easily.  Psychiatric/Behavioral: Negative for confusion, depression and sleep disturbance. The patient is not nervous/anxious.     PHYSICAL EXAMINATION:  Blood pressure 131/67, pulse 66, temperature 97.7 F (36.5 C), temperature source Temporal, resp. rate 16, height 5' 5 (1.651 m), weight 173 lb (78.5 kg), SpO2 97%.  ECOG PERFORMANCE STATUS: 1  Physical Exam  Constitutional: Oriented to person, place, and time and well-developed, well-nourished, and in no distress. HENT:  Head: Normocephalic and atraumatic.  Mouth/Throat: Oropharynx is clear and moist. No oropharyngeal exudate.  Eyes: Conjunctivae are normal. Right eye exhibits no discharge. Left eye exhibits no discharge. No scleral icterus.  Neck: Normal range of motion. Neck supple.  Cardiovascular: Normal rate, regular rhythm, normal heart sounds and intact  distal pulses.   Pulmonary/Chest: Effort normal and breath sounds normal. No respiratory distress. No wheezes. No rales.  Abdominal: Soft. Bowel sounds are normal. Exhibits no distension and no mass. There is no tenderness.  Musculoskeletal: Normal range of motion. Positive for mild ankle swelling.  Lymphadenopathy:    Positive for left supraclavicular adenopathy  Neurological: Alert and oriented to person, place, and time. Exhibits normal muscle tone. Gait normal. Coordination normal.  Skin: Skin is warm and dry. No rash noted. Not diaphoretic. No erythema. No pallor.  Psychiatric: Mood, memory and judgment normal.  Vitals reviewed.  LABORATORY DATA: Lab Results   Component Value Date   WBC 3.3 (L) 10/24/2024   HGB 14.2 10/24/2024   HCT 40.8 10/24/2024   MCV 85.9 10/24/2024   PLT 203 10/24/2024      Chemistry      Component Value Date/Time   NA 138 10/24/2024 1047   NA 140 03/07/2024 1600   K 3.6 10/24/2024 1047   CL 102 10/24/2024 1047   CO2 24 10/24/2024 1047   BUN 15 10/24/2024 1047   BUN 7 (L) 03/07/2024 1600   CREATININE 0.96 10/24/2024 1047      Component Value Date/Time   CALCIUM  10.5 (H) 10/24/2024 1047   ALKPHOS 88 10/24/2024 1047   AST 34 10/24/2024 1047   ALT 15 10/24/2024 1047   BILITOT 0.4 10/24/2024 1047       RADIOGRAPHIC STUDIES:  MR BRAIN W WO CONTRAST Result Date: 10/18/2024 EXAM: MRI BRAIN WITH AND WITHOUT CONTRAST 10/18/2024 12:45:00 PM TECHNIQUE: Multiplanar multisequence MRI of the head/brain was performed with and without the administration of 7 mL gadobutrol  (GADAVIST ) 1 MMOL/ML intravenous contrast. COMPARISON: MRI brain 10/27/2023. CLINICAL HISTORY: Brain/CNS neoplasm, assess treatment response. FINDINGS: BRAIN AND VENTRICLES: There is an overall similar 1.5 x 1.8 x 1.3 cm (AP x TR x CC) avidly enhancing extraaxial mass arising from the undersurface of the left tentorial leaflet with mild mass effect on the left pons and midbrain. No signal abnormality within the subjacent parenchyma. There is again effacement of the left perimesencephalic cistern. The mass is overall similar in size and configuration compared to prior exam. No acute infarct. No acute intracranial hemorrhage. No midline shift. No hydrocephalus. The sella is unremarkable. Normal flow voids. Sparse subcortical and periventricular T2 and FLAIR signal hyperintensity likely reflecting sequelae of chronic microvascular ischemia. ORBITS: Bilateral lens replacement. SINUSES: No acute abnormality. BONES AND SOFT TISSUES: Normal bone marrow signal and enhancement. No acute soft tissue abnormality. IMPRESSION: 1. Since 10/27/2023, overall similar 1.5 cm  enhancing extraaxial mass arising from the undersurface of the left tentorial leaflet with mild mass effect on the pons and midbrain, but without associated signal abnormality to suggest edema. Electronically signed by: prentice spade 10/18/2024 02:11 PM EST RP Workstation: [id number]     ASSESSMENT/PLAN:  This is a very pleasant 83 year old African-American female diagnosed with Stage IIIC (T3, N3, M0) non-small cell lung cancer, adenocarcinoma presented with supraclavicular lymphadenopathy, 2 left lung nodules diagnosed in September 2024.  There is also a short segment of wall thickening involving the splenic flexure of the colon with associated mild hypermetabolism which is nonspecific and colonoscopy was recommended.  Molecular studies showed positive KRAS G12C mutation and PD-L1 expression of 5%. The patient completed a course of concurrent chemoradiation with weekly carboplatin  for AUC of 2 and paclitaxel  45 Mg/M2 started on September 20, 2023.  She is status post 6 week of treatment.  Last day chemotherapy was on 10/24/23.  She is currently on consolidation immunotherapy with Imfinzi  1500 mg IV every 4 weeks. She is status post 11 cycles.    Labs were reviewed. Recommend she proceed with cycle #12 today as scheduled.     We will see her back for labs and follow up in 4 weeks for evaluation and repeat blood work before undergoing cycle #13.    We will arrange for a restaging CT of the chest and neck after her next infusion. Of course, if she notices enlarging left supraclavicular adenopathy to contact us  sooner.   Hypercalcemia Calcium  levels slightly elevated, improved since four weeks ago. Possible contribution from HCTZ use. - Limit cheese and dairy intake. - Increase water  intake. - Recheck calcium  levels next month. - Consider referral to family doctor if hypercalcemia persists.  She has intermittent mild ankle swelling without calf pain or erythema. The swelling worsens if she is  on her feet. She will use compression stockings when she is on her feet for extended periods.   The patient was advised to call immediately if she has any concerning symptoms in the interval. The patient voices understanding of current disease status and treatment options and is in agreement with the current care plan. All questions were answered. The patient knows to call the clinic with any problems, questions or concerns. We can certainly see the patient much sooner if necessary  No orders of the defined types were placed in this encounter.   The total time spent in the appointment was 20-29 minutes  Maylin Freeburg L Londyn Hotard, PA-C 10/24/24

## 2024-10-23 ENCOUNTER — Inpatient Hospital Stay: Payer: Medicare Other | Attending: Physician Assistant | Admitting: Internal Medicine

## 2024-10-23 ENCOUNTER — Telehealth: Payer: Medicare Other | Admitting: Physician Assistant

## 2024-10-23 DIAGNOSIS — Z90710 Acquired absence of both cervix and uterus: Secondary | ICD-10-CM | POA: Insufficient documentation

## 2024-10-23 DIAGNOSIS — Z79620 Long term (current) use of immunosuppressive biologic: Secondary | ICD-10-CM | POA: Insufficient documentation

## 2024-10-23 DIAGNOSIS — Z9049 Acquired absence of other specified parts of digestive tract: Secondary | ICD-10-CM | POA: Insufficient documentation

## 2024-10-23 DIAGNOSIS — Z923 Personal history of irradiation: Secondary | ICD-10-CM | POA: Insufficient documentation

## 2024-10-23 DIAGNOSIS — Z7982 Long term (current) use of aspirin: Secondary | ICD-10-CM | POA: Insufficient documentation

## 2024-10-23 DIAGNOSIS — Z79899 Other long term (current) drug therapy: Secondary | ICD-10-CM | POA: Insufficient documentation

## 2024-10-23 DIAGNOSIS — D329 Benign neoplasm of meninges, unspecified: Secondary | ICD-10-CM | POA: Diagnosis not present

## 2024-10-23 DIAGNOSIS — Z79818 Long term (current) use of other agents affecting estrogen receptors and estrogen levels: Secondary | ICD-10-CM | POA: Insufficient documentation

## 2024-10-23 DIAGNOSIS — R490 Dysphonia: Secondary | ICD-10-CM | POA: Insufficient documentation

## 2024-10-23 DIAGNOSIS — R0602 Shortness of breath: Secondary | ICD-10-CM | POA: Insufficient documentation

## 2024-10-23 DIAGNOSIS — I252 Old myocardial infarction: Secondary | ICD-10-CM | POA: Insufficient documentation

## 2024-10-23 DIAGNOSIS — Z9221 Personal history of antineoplastic chemotherapy: Secondary | ICD-10-CM | POA: Insufficient documentation

## 2024-10-23 DIAGNOSIS — R590 Localized enlarged lymph nodes: Secondary | ICD-10-CM | POA: Insufficient documentation

## 2024-10-23 DIAGNOSIS — Z5112 Encounter for antineoplastic immunotherapy: Secondary | ICD-10-CM | POA: Insufficient documentation

## 2024-10-23 DIAGNOSIS — M19079 Primary osteoarthritis, unspecified ankle and foot: Secondary | ICD-10-CM | POA: Insufficient documentation

## 2024-10-23 DIAGNOSIS — C3412 Malignant neoplasm of upper lobe, left bronchus or lung: Secondary | ICD-10-CM | POA: Insufficient documentation

## 2024-10-23 NOTE — Progress Notes (Signed)
 I connected with Stephanie Newton on 10/23/24 at 11:30 AM EST by telephone visit and verified that I am speaking with the correct person using two identifiers.  I discussed the limitations, risks, security and privacy concerns of performing an evaluation and management service by telemedicine and the availability of in-person appointments. I also discussed with the patient that there may be a patient responsible charge related to this service. The patient expressed understanding and agreed to proceed.  Other persons participating in the visit and their role in the encounter:  n/a  Patient's location:  Home Provider's location:  Office Chief Complaint:  Meningioma [facility])  History of Present Ilness: Stephanie Newton reports no clinical changes today.  Observations: Language and cognition at baseline  Imaging:  [facility] Clinician Interpretation: I have personally reviewed the CNS images as listed.  My interpretation, in the context of the patient's clinical presentation, is stable disease  MR BRAIN W WO CONTRAST Result Date: 10/18/2024 EXAM: MRI BRAIN WITH AND WITHOUT CONTRAST 10/18/2024 12:45:00 PM TECHNIQUE: Multiplanar multisequence MRI of the head/brain was performed with and without the administration of 7 mL gadobutrol  (GADAVIST ) 1 MMOL/ML intravenous contrast. COMPARISON: MRI brain 10/27/2023. CLINICAL HISTORY: Brain/CNS neoplasm, assess treatment response. FINDINGS: BRAIN AND VENTRICLES: There is an overall similar 1.5 x 1.8 x 1.3 cm (AP x TR x CC) avidly enhancing extraaxial mass arising from the undersurface of the left tentorial leaflet with mild mass effect on the left pons and midbrain. No signal abnormality within the subjacent parenchyma. There is again effacement of the left perimesencephalic cistern. The mass is overall similar in size and configuration compared to prior exam. No acute infarct. No acute intracranial hemorrhage. No midline shift. No hydrocephalus. The sella is  unremarkable. Normal flow voids. Sparse subcortical and periventricular T2 and FLAIR signal hyperintensity likely reflecting sequelae of chronic microvascular ischemia. ORBITS: Bilateral lens replacement. SINUSES: No acute abnormality. BONES AND SOFT TISSUES: Normal bone marrow signal and enhancement. No acute soft tissue abnormality. IMPRESSION: 1. Since 10/27/2023, overall similar 1.5 cm enhancing extraaxial mass arising from the undersurface of the left tentorial leaflet with mild mass effect on the pons and midbrain, but without associated signal abnormality to suggest edema. Electronically signed by: prentice spade 10/18/2024 02:11 PM EST RP Workstation: [id number]    Assessment and Plan: Meningioma (HCC)  Stephanie Newton is clinically stable today.  MRI brain demonstrates stable clival mass, c/w meningioma.  No new or progressive changes.  Follow Up Instructions:  We ask that Stephanie Newton return to clinic in 12 months following next brain MRI, or sooner as needed.  I discussed the assessment and treatment plan with the patient.  The patient was provided an opportunity to ask questions and all were answered.  The patient agreed with the plan and demonstrated understanding of the instructions.    The patient was advised to call back or seek an in-person evaluation if the symptoms worsen or if the condition fails to improve as anticipated.    [name], MD   I provided 23 minutes of non face-to-face telephone visit time during this encounter, and > 50% was spent counseling as documented under my assessment & plan.

## 2024-10-24 ENCOUNTER — Inpatient Hospital Stay (HOSPITAL_BASED_OUTPATIENT_CLINIC_OR_DEPARTMENT_OTHER): Admitting: Physician Assistant

## 2024-10-24 ENCOUNTER — Inpatient Hospital Stay

## 2024-10-24 ENCOUNTER — Telehealth: Payer: Self-pay | Admitting: Internal Medicine

## 2024-10-24 VITALS — BP 131/67 | HR 66 | Temp 97.7°F | Resp 16 | Ht 65.0 in | Wt 173.0 lb

## 2024-10-24 DIAGNOSIS — Z5112 Encounter for antineoplastic immunotherapy: Secondary | ICD-10-CM

## 2024-10-24 DIAGNOSIS — Z79620 Long term (current) use of immunosuppressive biologic: Secondary | ICD-10-CM | POA: Diagnosis not present

## 2024-10-24 DIAGNOSIS — R590 Localized enlarged lymph nodes: Secondary | ICD-10-CM | POA: Diagnosis not present

## 2024-10-24 DIAGNOSIS — Z79899 Other long term (current) drug therapy: Secondary | ICD-10-CM | POA: Diagnosis not present

## 2024-10-24 DIAGNOSIS — Z9049 Acquired absence of other specified parts of digestive tract: Secondary | ICD-10-CM | POA: Diagnosis not present

## 2024-10-24 DIAGNOSIS — M19079 Primary osteoarthritis, unspecified ankle and foot: Secondary | ICD-10-CM | POA: Diagnosis not present

## 2024-10-24 DIAGNOSIS — C3412 Malignant neoplasm of upper lobe, left bronchus or lung: Secondary | ICD-10-CM

## 2024-10-24 DIAGNOSIS — D329 Benign neoplasm of meninges, unspecified: Secondary | ICD-10-CM | POA: Diagnosis not present

## 2024-10-24 DIAGNOSIS — Z923 Personal history of irradiation: Secondary | ICD-10-CM | POA: Diagnosis not present

## 2024-10-24 DIAGNOSIS — Z9221 Personal history of antineoplastic chemotherapy: Secondary | ICD-10-CM | POA: Diagnosis not present

## 2024-10-24 DIAGNOSIS — I252 Old myocardial infarction: Secondary | ICD-10-CM | POA: Diagnosis not present

## 2024-10-24 DIAGNOSIS — R0602 Shortness of breath: Secondary | ICD-10-CM | POA: Diagnosis not present

## 2024-10-24 DIAGNOSIS — Z79818 Long term (current) use of other agents affecting estrogen receptors and estrogen levels: Secondary | ICD-10-CM | POA: Diagnosis not present

## 2024-10-24 DIAGNOSIS — Z90710 Acquired absence of both cervix and uterus: Secondary | ICD-10-CM | POA: Diagnosis not present

## 2024-10-24 DIAGNOSIS — Z7982 Long term (current) use of aspirin: Secondary | ICD-10-CM | POA: Diagnosis not present

## 2024-10-24 DIAGNOSIS — R490 Dysphonia: Secondary | ICD-10-CM | POA: Diagnosis not present

## 2024-10-24 LAB — CBC WITH DIFFERENTIAL (CANCER CENTER ONLY)
Abs Immature Granulocytes: 0.01 K/uL (ref 0.00–0.07)
Basophils Absolute: 0 K/uL (ref 0.0–0.1)
Basophils Relative: 1 %
Eosinophils Absolute: 0.2 K/uL (ref 0.0–0.5)
Eosinophils Relative: 5 %
HCT: 40.8 % (ref 36.0–46.0)
Hemoglobin: 14.2 g/dL (ref 12.0–15.0)
Immature Granulocytes: 0 %
Lymphocytes Relative: 24 %
Lymphs Abs: 0.8 K/uL (ref 0.7–4.0)
MCH: 29.9 pg (ref 26.0–34.0)
MCHC: 34.8 g/dL (ref 30.0–36.0)
MCV: 85.9 fL (ref 80.0–100.0)
Monocytes Absolute: 0.4 K/uL (ref 0.1–1.0)
Monocytes Relative: 13 %
Neutro Abs: 1.9 K/uL (ref 1.7–7.7)
Neutrophils Relative %: 57 %
Platelet Count: 203 K/uL (ref 150–400)
RBC: 4.75 MIL/uL (ref 3.87–5.11)
RDW: 13.9 % (ref 11.5–15.5)
WBC Count: 3.3 K/uL — ABNORMAL LOW (ref 4.0–10.5)
nRBC: 0 % (ref 0.0–0.2)

## 2024-10-24 LAB — CMP (CANCER CENTER ONLY)
ALT: 15 U/L (ref 0–44)
AST: 34 U/L (ref 15–41)
Albumin: 4.2 g/dL (ref 3.5–5.0)
Alkaline Phosphatase: 88 U/L (ref 38–126)
Anion gap: 13 (ref 5–15)
BUN: 15 mg/dL (ref 8–23)
CO2: 24 mmol/L (ref 22–32)
Calcium: 10.5 mg/dL — ABNORMAL HIGH (ref 8.9–10.3)
Chloride: 102 mmol/L (ref 98–111)
Creatinine: 0.96 mg/dL (ref 0.44–1.00)
GFR, Estimated: 60 mL/min — ABNORMAL LOW (ref >60)
Glucose, Bld: 115 mg/dL — ABNORMAL HIGH (ref 70–99)
Potassium: 3.6 mmol/L (ref 3.5–5.1)
Sodium: 138 mmol/L (ref 135–145)
Total Bilirubin: 0.4 mg/dL (ref 0.0–1.2)
Total Protein: 7.2 g/dL (ref 6.5–8.1)

## 2024-10-24 LAB — TSH: TSH: 2.37 u[IU]/mL (ref 0.350–4.500)

## 2024-10-24 MED ORDER — SODIUM CHLORIDE 0.9 % IV SOLN
1500.0000 mg | Freq: Once | INTRAVENOUS | Status: AC
  Administered 2024-10-24: 1500 mg via INTRAVENOUS
  Filled 2024-10-24: qty 30

## 2024-10-24 MED ORDER — SODIUM CHLORIDE 0.9 % IV SOLN: INTRAVENOUS | Status: DC

## 2024-10-24 NOTE — Patient Instructions (Signed)

## 2024-10-24 NOTE — Telephone Encounter (Signed)
 Scheduled patient for next appointment. Called and spoke with the patient, she is aware.

## 2024-10-25 ENCOUNTER — Other Ambulatory Visit: Payer: Self-pay

## 2024-10-25 LAB — T4: T4, Total: 8.1 ug/dL (ref 4.5–12.0)

## 2024-11-20 ENCOUNTER — Inpatient Hospital Stay: Admitting: Internal Medicine

## 2024-11-20 ENCOUNTER — Inpatient Hospital Stay: Attending: Physician Assistant

## 2024-11-20 VITALS — BP 125/67 | HR 79 | Temp 97.6°F | Resp 17 | Ht 65.0 in | Wt 172.0 lb

## 2024-11-20 DIAGNOSIS — Z9049 Acquired absence of other specified parts of digestive tract: Secondary | ICD-10-CM | POA: Insufficient documentation

## 2024-11-20 DIAGNOSIS — I252 Old myocardial infarction: Secondary | ICD-10-CM | POA: Insufficient documentation

## 2024-11-20 DIAGNOSIS — Z96653 Presence of artificial knee joint, bilateral: Secondary | ICD-10-CM | POA: Insufficient documentation

## 2024-11-20 DIAGNOSIS — Z79818 Long term (current) use of other agents affecting estrogen receptors and estrogen levels: Secondary | ICD-10-CM | POA: Insufficient documentation

## 2024-11-20 DIAGNOSIS — M542 Cervicalgia: Secondary | ICD-10-CM | POA: Diagnosis not present

## 2024-11-20 DIAGNOSIS — Z5112 Encounter for antineoplastic immunotherapy: Secondary | ICD-10-CM | POA: Insufficient documentation

## 2024-11-20 DIAGNOSIS — C3412 Malignant neoplasm of upper lobe, left bronchus or lung: Secondary | ICD-10-CM | POA: Diagnosis present

## 2024-11-20 DIAGNOSIS — C3490 Malignant neoplasm of unspecified part of unspecified bronchus or lung: Secondary | ICD-10-CM

## 2024-11-20 DIAGNOSIS — K521 Toxic gastroenteritis and colitis: Secondary | ICD-10-CM | POA: Insufficient documentation

## 2024-11-20 DIAGNOSIS — Z79899 Other long term (current) drug therapy: Secondary | ICD-10-CM | POA: Insufficient documentation

## 2024-11-20 DIAGNOSIS — Z7982 Long term (current) use of aspirin: Secondary | ICD-10-CM | POA: Diagnosis not present

## 2024-11-20 DIAGNOSIS — T451X5A Adverse effect of antineoplastic and immunosuppressive drugs, initial encounter: Secondary | ICD-10-CM | POA: Diagnosis not present

## 2024-11-20 DIAGNOSIS — Z923 Personal history of irradiation: Secondary | ICD-10-CM | POA: Insufficient documentation

## 2024-11-20 DIAGNOSIS — Z90710 Acquired absence of both cervix and uterus: Secondary | ICD-10-CM | POA: Insufficient documentation

## 2024-11-20 LAB — CMP (CANCER CENTER ONLY)
ALT: 16 U/L (ref 0–44)
AST: 36 U/L (ref 15–41)
Albumin: 4.2 g/dL (ref 3.5–5.0)
Alkaline Phosphatase: 84 U/L (ref 38–126)
Anion gap: 10 (ref 5–15)
BUN: 11 mg/dL (ref 8–23)
CO2: 26 mmol/L (ref 22–32)
Calcium: 10.4 mg/dL — ABNORMAL HIGH (ref 8.9–10.3)
Chloride: 103 mmol/L (ref 98–111)
Creatinine: 0.94 mg/dL (ref 0.44–1.00)
GFR, Estimated: >60 mL/min (ref >60)
Glucose, Bld: 100 mg/dL — ABNORMAL HIGH (ref 70–99)
Potassium: 4.2 mmol/L (ref 3.5–5.1)
Sodium: 140 mmol/L (ref 135–145)
Total Bilirubin: 0.4 mg/dL (ref 0.0–1.2)
Total Protein: 7.2 g/dL (ref 6.5–8.1)

## 2024-11-20 LAB — CBC WITH DIFFERENTIAL (CANCER CENTER ONLY)
Abs Immature Granulocytes: 0.01 K/uL (ref 0.00–0.07)
Basophils Absolute: 0 K/uL (ref 0.0–0.1)
Basophils Relative: 1 %
Eosinophils Absolute: 0.1 K/uL (ref 0.0–0.5)
Eosinophils Relative: 4 %
HCT: 40.2 % (ref 36.0–46.0)
Hemoglobin: 13.6 g/dL (ref 12.0–15.0)
Immature Granulocytes: 0 %
Lymphocytes Relative: 24 %
Lymphs Abs: 0.8 K/uL (ref 0.7–4.0)
MCH: 29.3 pg (ref 26.0–34.0)
MCHC: 33.8 g/dL (ref 30.0–36.0)
MCV: 86.6 fL (ref 80.0–100.0)
Monocytes Absolute: 0.4 K/uL (ref 0.1–1.0)
Monocytes Relative: 13 %
Neutro Abs: 2 K/uL (ref 1.7–7.7)
Neutrophils Relative %: 58 %
Platelet Count: 222 K/uL (ref 150–400)
RBC: 4.64 MIL/uL (ref 3.87–5.11)
RDW: 13.8 % (ref 11.5–15.5)
WBC Count: 3.4 K/uL — ABNORMAL LOW (ref 4.0–10.5)
nRBC: 0 % (ref 0.0–0.2)

## 2024-11-20 LAB — TSH: TSH: 1.99 u[IU]/mL (ref 0.350–4.500)

## 2024-11-20 MED ORDER — SODIUM CHLORIDE 0.9 % IV SOLN
1500.0000 mg | Freq: Once | INTRAVENOUS | Status: AC
  Administered 2024-11-20: 15:00:00 1500 mg via INTRAVENOUS
  Filled 2024-11-20: qty 30

## 2024-11-20 MED ORDER — SODIUM CHLORIDE 0.9 % IV SOLN: INTRAVENOUS | Status: DC

## 2024-11-20 NOTE — Progress Notes (Signed)
 [facility] [phone]   Fax:([phone]  OFFICE PROGRESS NOTE  [name] LABOR, MD [address] [place] [address]  DIAGNOSIS: Stage IIIC (T3, N3, M0) non-small cell lung cancer, adenocarcinoma presented with supraclavicular lymphadenopathy, 2 left lung nodules diagnosed in September 2024.  There is also a short segment of wall thickening involving the splenic flexure of the colon with associated mild hypermetabolism which is nonspecific and colonoscopy was recommended.   Biomarker Findings HRD signature - HRDsig Negative Microsatellite status - MS-Equivocal ? Tumor Mutational Burden - 2 Muts/Mb Genomic Findings For a complete list of the genes assayed, please refer to the Appendix. KRAS G12C TP53 R110L 7 Disease relevant genes with no reportable alterations: ALK, BRAF, EGFR, ERBB2, MET, RET, ROS1  PDL1 Expression 5%.  PRIOR THERAPY: Concurrent chemoradiation with carboplatin  for an AUC of 2 and taxol  45 mg/m2.  First dose September 20, 2023.  Status post 6 cycles.   CURRENT THERAPY: Consolidation treatment with Imfinzi  1500 Mg IV every 4 weeks status post 12 cycles.   INTERVAL HISTORY: Stephanie Newton 83 y.o. female returns to the clinic today for follow-up visit. Discussed the use of AI scribe software for clinical note transcription with the patient, who gave verbal consent to proceed.  History of Present Illness Stephanie Newton is an 83 year old female with stage IIIC non-small cell lung adenocarcinoma of the left lung undergoing concurrent chemoradiation who presents for follow-up and assessment of new symptoms.  She is currently completing her final cycle of concurrent chemoradiation and has tolerated treatment well overall, with no significant new systemic symptoms. She describes feeling generally well today.  She reports recent onset of diarrhea, consisting of two to three episodes of liquid stools and one  additional episode, without clear dietary triggers. She denies other gastrointestinal symptoms.  She notes increased size and pain in the left supraclavicular region, corresponding to the site of her initial disease, with pain exacerbated by neck rotation. She expresses concern regarding this change and asks what to do if symptoms worsen prior to her next scheduled imaging.  She expresses anxiety regarding the transition away from monthly oncology visits and requests clarification on follow-up protocols and indications for contacting the oncology team.    MEDICAL HISTORY: Past Medical History:  Diagnosis Date   Arthritis    Dysrhythmia    Hypercholesteremia    Hypertension    Myocardial infarction (HCC)     ALLERGIES:  is allergic to shellfish allergy.  MEDICATIONS:  Current Outpatient Medications  Medication Sig Dispense Refill   amLODipine (NORVASC) 2.5 MG tablet Take 2.5 mg by mouth daily.     aspirin  EC 81 MG tablet Take 1 tablet by mouth daily.     estradiol  (ESTRACE ) 1 MG tablet Take 1 mg by mouth daily.     famotidine  (PEPCID ) 20 MG tablet Take 20 mg by mouth as needed.     hydrochlorothiazide  (HYDRODIURIL ) 12.5 MG tablet Take 12.5 mg by mouth daily.      methocarbamol  (ROBAXIN ) 500 MG tablet Take 1 tablet (500 mg total) by mouth every 6 (six) hours as needed for muscle spasms. 40 tablet 0   metoprolol  succinate (TOPROL -XL) 100 MG 24 hr tablet Take 100 mg by mouth daily.     metoprolol  succinate (TOPROL -XL) 50 MG 24 hr tablet TAKE 1 TABLET BY MOUTH DAILY 90 tablet 11   metoprolol  tartrate (LOPRESSOR ) 100 MG tablet Take 1 tablet (100 mg total) by  mouth once for 1 dose. Take 90-120 minutes prior to scan. Hold for SBP less than 110. 1 tablet 0   nitrofurantoin , macrocrystal-monohydrate, (MACROBID ) 100 MG capsule Take 1 capsule (100 mg total) by mouth 2 (two) times daily. 14 capsule 0   oxyCODONE -acetaminophen  (PERCOCET/ROXICET) 5-325 MG tablet Take 1-2 tablets by mouth every 8  (eight) hours as needed for severe pain. 6 tablet 0   prochlorperazine  (COMPAZINE ) 10 MG tablet Take 1 tablet (10 mg total) by mouth every 6 (six) hours as needed. 30 tablet 2   simvastatin  (ZOCOR ) 20 MG tablet Take 20 mg by mouth at bedtime.     sucralfate  (CARAFATE ) 1 g tablet Take 1 tablet (1 g total) by mouth 4 (four) times daily -  with meals and at bedtime. 120 tablet 0   Vitamin D, Ergocalciferol, (DRISDOL) 1.25 MG (50000 UNIT) CAPS capsule Take 50,000 Units by mouth once a week.     No current facility-administered medications for this visit.    SURGICAL HISTORY:  Past Surgical History:  Procedure Laterality Date   BREAST EXCISIONAL BIOPSY Right    x2   BRONCHIAL BIOPSY  08/30/2023   Procedure: BRONCHIAL BIOPSIES;  Surgeon: Brenna Adine CROME, DO;  Location: [facility];  Service: Pulmonary;;   EYE SURGERY     cataract bilateral    HERNIA REPAIR  06/2000   INCISIONAL VENTRAL ; DR GEORGETTE POLI    KNEE ARTHROSCOPY Left 08/23/2017   dr gerome    LAPAROSCOPIC CHOLECYSTECTOMY  06/13/2000   DR DOUG [facility]    LEFT HEART CATH AND CORONARY ANGIOGRAPHY N/A 12/04/2018   Procedure: LEFT HEART CATH AND CORONARY ANGIOGRAPHY;  Surgeon: Claudene Victory ORN, MD;  Location: [facility] INVAS[facility];  Service: Cardiovascular;  Laterality: N/A;   TOTAL KNEE ARTHROPLASTY Left 03/31/2018   Procedure: LEFT TOTAL KNEE ARTHROPLASTY;  Surgeon: Gerome Charleston, MD;  Location: [facility];  Service: Orthopedics;  Laterality: Left;  with block   TOTAL KNEE ARTHROPLASTY Right 04/30/2020   Procedure: RIGHT TOTAL KNEE ARTHROPLASTY;  Surgeon: Melodi Lerner, MD;  Location: [facility];  Service: Orthopedics;  Laterality: Right;    VAGINAL HYSTERECTOMY  age 24s    REVIEW OF SYSTEMS:  A comprehensive review of systems was negative except for: Constitutional: positive for fatigue   PHYSICAL EXAMINATION: General appearance: alert, cooperative, fatigued, and no distress Head: Normocephalic, without obvious abnormality,  atraumatic Neck: moderate anterior cervical adenopathy, no JVD, supple, symmetrical, trachea midline, and thyroid  not enlarged, symmetric, no tenderness/mass/nodules Lymph nodes: Supraclavicular adenopathy: Palpable left supraclavicular lymph node Resp: clear to auscultation bilaterally Back: symmetric, no curvature. ROM normal. No CVA tenderness. Cardio: regular rate and rhythm, S1, S2 normal, no murmur, click, rub or gallop GI: soft, non-tender; bowel sounds normal; no masses,  no organomegaly Extremities: extremities normal, atraumatic, no cyanosis or edema  ECOG PERFORMANCE STATUS: 1 - Symptomatic but completely ambulatory  Blood pressure 125/67, pulse 79, temperature 97.6 F (36.4 C), temperature source Temporal, resp. rate 17, height 5' 5 (1.651 m), weight 172 lb (78 kg), SpO2 97%.  LABORATORY DATA: Lab Results  Component Value Date   WBC 3.4 (L) 11/20/2024   HGB 13.6 11/20/2024   HCT 40.2 11/20/2024   MCV 86.6 11/20/2024   PLT 222 11/20/2024      Chemistry      Component Value Date/Time   NA 138 10/24/2024 1047   NA 140 03/07/2024 1600   K 3.6 10/24/2024 1047   CL 102 10/24/2024 1047   CO2 24  10/24/2024 1047   BUN 15 10/24/2024 1047   BUN 7 (L) 03/07/2024 1600   CREATININE 0.96 10/24/2024 1047      Component Value Date/Time   CALCIUM  10.5 (H) 10/24/2024 1047   ALKPHOS 88 10/24/2024 1047   AST 34 10/24/2024 1047   ALT 15 10/24/2024 1047   BILITOT 0.4 10/24/2024 1047       RADIOGRAPHIC STUDIES: No results found.   ASSESSMENT AND PLAN: This is a very pleasant 83 years old African-American female diagnosed with Stage IIIC (T3, N3, M0) non-small cell lung cancer, adenocarcinoma presented with supraclavicular lymphadenopathy, 2 left lung nodules diagnosed in September 2024.  There is also a short segment of wall thickening involving the splenic flexure of the colon with associated mild hypermetabolism which is nonspecific and colonoscopy was recommended.   Molecular studies showed positive KRAS G12C mutation and PD-L1 expression of 5%. The patient completed a course of concurrent chemoradiation with weekly carboplatin  for AUC of 2 and paclitaxel  45 Mg/M2 started on September 20, 2023.  She is status post 6 week of treatment. The patient is currently undergoing consolidation treatment with immunotherapy with Imfinzi  1500 Mg IV every 4 weeks status post 12 cycles.  The patient has been tolerating this treatment fairly well. Assessment and Plan Assessment & Plan Stage IIIC non-small cell lung adenocarcinoma of the left lung with KRAS G12C mutation and PD-L1 expression Status post concurrent chemoradiation, currently completing final cycle. She reports new pain and possible enlargement of the left supraclavicular region, corresponding to her initial disease site. Otherwise, she has tolerated therapy well and remains clinically stable. There is concern for possible isolated progression in the left supraclavicular area; local therapy may be considered if confirmed. - Administered final cycle of chemotherapy. - Ordered imaging in three weeks to assess disease status, including the left supraclavicular region. - Instructed her to report any increase in size of the left supraclavicular area prior to the scheduled scan. - Discussed potential radiation to the left supraclavicular area for local control if isolated progression is confirmed. - Planned follow-up visit in one month. - Planned ongoing surveillance with scans every three months if stable.  Chemotherapy-induced diarrhea She reports mild, recent-onset diarrhea with two to three episodes of liquid stools, not associated with dietary changes. - Provided reassurance regarding mild symptoms. - No new interventions initiated. The patient was advised to call immediately if she has any concerning symptoms in the interval.   The patient voices understanding of current disease status and treatment options  and is in agreement with the current care plan.  All questions were answered. The patient knows to call the clinic with any problems, questions or concerns. We can certainly see the patient much sooner if necessary.  The total time spent in the appointment was 20 minutes.  Disclaimer: This note was dictated with voice recognition software. Similar sounding words can inadvertently be transcribed and may not be corrected upon review.

## 2024-11-20 NOTE — Patient Instructions (Signed)
 [facility] - A DEPT OF [place]  [facility]  Discharge Instructions: Thank you for choosing [facility] to provide your oncology and hematology care.   If you have a lab appointment with the Cancer Center, please go directly to the Cancer Center and check in at the registration area.   Wear comfortable clothing and clothing appropriate for easy access to any Portacath or PICC line.   We strive to give you quality time with your provider. You may need to reschedule your appointment if you arrive late (15 or more minutes).  Arriving late affects you and other patients whose appointments are after yours.  Also, if you miss three or more appointments without notifying the office, you may be dismissed from the clinic at the provider's discretion.      For prescription refill requests, have your pharmacy contact our office and allow 72 hours for refills to be completed.    Today you received the following chemotherapy and/or immunotherapy agents imfinzi       To help prevent nausea and vomiting after your treatment, we encourage you to take your nausea medication as directed.  BELOW ARE SYMPTOMS THAT SHOULD BE REPORTED IMMEDIATELY: *FEVER GREATER THAN 100.4 F (38 C) OR HIGHER *CHILLS OR SWEATING *NAUSEA AND VOMITING THAT IS NOT CONTROLLED WITH YOUR NAUSEA MEDICATION *UNUSUAL SHORTNESS OF BREATH *UNUSUAL BRUISING OR BLEEDING *URINARY PROBLEMS (pain or burning when urinating, or frequent urination) *BOWEL PROBLEMS (unusual diarrhea, constipation, pain near the anus) TENDERNESS IN MOUTH AND THROAT WITH OR WITHOUT PRESENCE OF ULCERS (sore throat, sores in mouth, or a toothache) UNUSUAL RASH, SWELLING OR PAIN  UNUSUAL VAGINAL DISCHARGE OR ITCHING   Items with * indicate a potential emergency and should be followed up as soon as possible or go to the Emergency Department if any problems should occur.  Please show the CHEMOTHERAPY ALERT CARD or IMMUNOTHERAPY  ALERT CARD at check-in to the Emergency Department and triage nurse.  Should you have questions after your visit or need to cancel or reschedule your appointment, please contact [facility] DE[facility]  Dept: [phone]  and follow the prompts.  Office hours are 8:00 a.m. to 4:30 p.m. Monday - Friday. Please note that voicemails left after 4:00 p.m. may not be returned until the following business day.  We are closed weekends and major holidays. You have access to a nurse at all times for urgent questions. Please call the main number to the clinic Dept: [phone] and follow the prompts.   For any non-urgent questions, you may also contact your provider using MyChart. We now offer e-Visits for anyone 34 and older to request care online for non-urgent symptoms. For details visit [email or web address].   Also download the MyChart app! Go to the app store, search MyChart, open the app, select [facility], and log in with your MyChart username and password.

## 2024-11-21 LAB — T4: T4, Total: 8.5 ug/dL (ref 4.5–12.0)

## 2024-11-26 ENCOUNTER — Telehealth: Payer: Self-pay | Admitting: Internal Medicine

## 2024-11-26 NOTE — Telephone Encounter (Signed)
 Scheduled patient for next appointment. Called and spoke with the patient, she is aware of day and time.

## 2024-12-11 ENCOUNTER — Inpatient Hospital Stay: Attending: Physician Assistant

## 2024-12-14 ENCOUNTER — Ambulatory Visit (HOSPITAL_BASED_OUTPATIENT_CLINIC_OR_DEPARTMENT_OTHER)
Admission: RE | Admit: 2024-12-14 | Discharge: 2024-12-14 | Disposition: A | Discharge status: Home / Self Care | Source: Ambulatory Visit | Attending: Internal Medicine | Admitting: Internal Medicine

## 2024-12-14 ENCOUNTER — Inpatient Hospital Stay: Attending: Physician Assistant

## 2024-12-14 DIAGNOSIS — Z90710 Acquired absence of both cervix and uterus: Secondary | ICD-10-CM | POA: Insufficient documentation

## 2024-12-14 DIAGNOSIS — M50322 Other cervical disc degeneration at C5-C6 level: Secondary | ICD-10-CM | POA: Insufficient documentation

## 2024-12-14 DIAGNOSIS — Z9226 Personal history of immune checkpoint inhibitor therapy: Secondary | ICD-10-CM | POA: Insufficient documentation

## 2024-12-14 DIAGNOSIS — Z9049 Acquired absence of other specified parts of digestive tract: Secondary | ICD-10-CM | POA: Insufficient documentation

## 2024-12-14 DIAGNOSIS — Z79899 Other long term (current) drug therapy: Secondary | ICD-10-CM | POA: Insufficient documentation

## 2024-12-14 DIAGNOSIS — C3412 Malignant neoplasm of upper lobe, left bronchus or lung: Secondary | ICD-10-CM | POA: Insufficient documentation

## 2024-12-14 DIAGNOSIS — Z923 Personal history of irradiation: Secondary | ICD-10-CM | POA: Insufficient documentation

## 2024-12-14 DIAGNOSIS — C3490 Malignant neoplasm of unspecified part of unspecified bronchus or lung: Secondary | ICD-10-CM

## 2024-12-14 DIAGNOSIS — I252 Old myocardial infarction: Secondary | ICD-10-CM | POA: Insufficient documentation

## 2024-12-14 DIAGNOSIS — C770 Secondary and unspecified malignant neoplasm of lymph nodes of head, face and neck: Secondary | ICD-10-CM

## 2024-12-14 DIAGNOSIS — Z5112 Encounter for antineoplastic immunotherapy: Secondary | ICD-10-CM | POA: Insufficient documentation

## 2024-12-14 DIAGNOSIS — Z7982 Long term (current) use of aspirin: Secondary | ICD-10-CM | POA: Insufficient documentation

## 2024-12-14 DIAGNOSIS — Z9221 Personal history of antineoplastic chemotherapy: Secondary | ICD-10-CM | POA: Insufficient documentation

## 2024-12-14 LAB — CMP (CANCER CENTER ONLY)
ALT: 22 U/L (ref 0–44)
AST: 33 U/L (ref 15–41)
Albumin: 4 g/dL (ref 3.5–5.0)
Alkaline Phosphatase: 91 U/L (ref 38–126)
Anion gap: 11 (ref 5–15)
BUN: 13 mg/dL (ref 8–23)
CO2: 26 mmol/L (ref 22–32)
Calcium: 9.8 mg/dL (ref 8.9–10.3)
Chloride: 102 mmol/L (ref 98–111)
Creatinine: 1.21 mg/dL — ABNORMAL HIGH (ref 0.44–1.00)
GFR, Estimated: 45 mL/min — ABNORMAL LOW
Glucose, Bld: 106 mg/dL — ABNORMAL HIGH (ref 70–99)
Potassium: 4 mmol/L (ref 3.5–5.1)
Sodium: 138 mmol/L (ref 135–145)
Total Bilirubin: 0.4 mg/dL (ref 0.0–1.2)
Total Protein: 7.1 g/dL (ref 6.5–8.1)

## 2024-12-14 LAB — CBC WITH DIFFERENTIAL (CANCER CENTER ONLY)
Abs Immature Granulocytes: 0.02 K/uL (ref 0.00–0.07)
Basophils Absolute: 0 K/uL (ref 0.0–0.1)
Basophils Relative: 1 %
Eosinophils Absolute: 0.2 K/uL (ref 0.0–0.5)
Eosinophils Relative: 4 %
HCT: 41.7 % (ref 36.0–46.0)
Hemoglobin: 14.4 g/dL (ref 12.0–15.0)
Immature Granulocytes: 0 %
Lymphocytes Relative: 21 %
Lymphs Abs: 0.9 K/uL (ref 0.7–4.0)
MCH: 29.8 pg (ref 26.0–34.0)
MCHC: 34.5 g/dL (ref 30.0–36.0)
MCV: 86.3 fL (ref 80.0–100.0)
Monocytes Absolute: 0.5 K/uL (ref 0.1–1.0)
Monocytes Relative: 12 %
Neutro Abs: 2.8 K/uL (ref 1.7–7.7)
Neutrophils Relative %: 62 %
Platelet Count: 192 K/uL (ref 150–400)
RBC: 4.83 MIL/uL (ref 3.87–5.11)
RDW: 14.2 % (ref 11.5–15.5)
WBC Count: 4.5 K/uL (ref 4.0–10.5)
nRBC: 0 % (ref 0.0–0.2)

## 2024-12-14 MED ORDER — IOHEXOL 300 MG/ML  SOLN
80.0000 mL | Freq: Once | INTRAMUSCULAR | Status: AC | PRN
  Administered 2024-12-14: 75 mL via INTRAVENOUS

## 2024-12-24 ENCOUNTER — Inpatient Hospital Stay: Admitting: Internal Medicine

## 2024-12-24 VITALS — HR 75 | Temp 97.7°F | Resp 17 | Ht 65.0 in | Wt 174.0 lb

## 2024-12-24 DIAGNOSIS — C3490 Malignant neoplasm of unspecified part of unspecified bronchus or lung: Secondary | ICD-10-CM | POA: Diagnosis not present

## 2024-12-24 NOTE — Progress Notes (Signed)
 "     [facility] [phone]   [phone]  OFFICE PROGRESS NOTE  [name] LABOR, MD [address] [place] [address]  DIAGNOSIS: Stage IIIC (T3, N3, M0) non-small cell lung cancer, adenocarcinoma presented with supraclavicular lymphadenopathy, 2 left lung nodules diagnosed in September 2024.  There is also a short segment of wall thickening involving the splenic flexure of the colon with associated mild hypermetabolism which is nonspecific and colonoscopy was recommended.   Biomarker Findings HRD signature - HRDsig Negative Microsatellite status - MS-Equivocal ? Tumor Mutational Burden - 2 Muts/Mb Genomic Findings For a complete list of the genes assayed, please refer to the Appendix. KRAS G12C TP53 R110L 7 Disease relevant genes with no reportable alterations: ALK, BRAF, EGFR, ERBB2, MET, RET, ROS1  PDL1 Expression 5%.  PRIOR THERAPY:  1) Concurrent chemoradiation with carboplatin  for an AUC of 2 and taxol  45 mg/m2.  First dose September 20, 2023.  Status post 6 cycles.  2) Consolidation treatment with Imfinzi  1500 Mg IV every 4 weeks status post 13 cycles.  Last dose was given November 20, 2024.  CURRENT THERAPY: Observation.  INTERVAL HISTORY: Stephanie Newton 84 y.o. female returns to the clinic today for follow-up visit. Discussed the use of AI scribe software for clinical note transcription with the patient, who gave verbal consent to proceed.  History of Present Illness Stephanie Newton is an 84 year old female with stage III non-small cell lung cancer who presents for post-treatment surveillance and restaging imaging.  She completed concurrent chemoradiation with carboplatin  and paclitaxel , followed by one year of consolidation durvalumab , with the last dose administered on November 20, 2024. She is currently under surveillance and presents for evaluation and repeat CT scans of the neck and chest.  She denies  any current complaints or new symptoms. She reports no side effects during immunotherapy and states she did real good throughout the year of treatment. She has no neck discomfort at present, though she notes intermittent soreness in the past.  Her only ongoing issue is low energy, which she finds frustrating and is interested in improving. Her husband and daughter confirm she did well at home during treatment.    MEDICAL HISTORY: Past Medical History:  Diagnosis Date   Arthritis    Dysrhythmia    Hypercholesteremia    Hypertension    Myocardial infarction [facility])     ALLERGIES:  is allergic to shellfish allergy.  MEDICATIONS:  Current Outpatient Medications  Medication Sig Dispense Refill   amLODipine (NORVASC) 2.5 MG tablet Take 2.5 mg by mouth daily.     aspirin  EC 81 MG tablet Take 1 tablet by mouth daily.     estradiol  (ESTRACE ) 1 MG tablet Take 1 mg by mouth daily.     famotidine  (PEPCID ) 20 MG tablet Take 20 mg by mouth as needed.     hydrochlorothiazide  (HYDRODIURIL ) 12.5 MG tablet Take 12.5 mg by mouth daily.      methocarbamol  (ROBAXIN ) 500 MG tablet Take 1 tablet (500 mg total) by mouth every 6 (six) hours as needed for muscle spasms. 40 tablet 0   metoprolol  succinate (TOPROL -XL) 100 MG 24 hr tablet Take 100 mg by mouth daily.     metoprolol  succinate (TOPROL -XL) 50 MG 24 hr tablet TAKE 1 TABLET BY MOUTH DAILY 90 tablet 11   metoprolol  tartrate (LOPRESSOR ) 100 MG tablet Take 1 tablet (100 mg total) by mouth once for 1 dose. Take 90-120 minutes prior to scan.  Hold for SBP less than 110. 1 tablet 0   nitrofurantoin , macrocrystal-monohydrate, (MACROBID ) 100 MG capsule Take 1 capsule (100 mg total) by mouth 2 (two) times daily. 14 capsule 0   oxyCODONE -acetaminophen  (PERCOCET/ROXICET) 5-325 MG tablet Take 1-2 tablets by mouth every 8 (eight) hours as needed for severe pain. 6 tablet 0   prochlorperazine  (COMPAZINE ) 10 MG tablet Take 1 tablet (10 mg total) by mouth every 6 (six)  hours as needed. 30 tablet 2   simvastatin  (ZOCOR ) 20 MG tablet Take 20 mg by mouth at bedtime.     sucralfate  (CARAFATE ) 1 g tablet Take 1 tablet (1 g total) by mouth 4 (four) times daily -  with meals and at bedtime. 120 tablet 0   Vitamin D, Ergocalciferol, (DRISDOL) 1.25 MG (50000 UNIT) CAPS capsule Take 50,000 Units by mouth once a week.     No current facility-administered medications for this visit.    SURGICAL HISTORY:  Past Surgical History:  Procedure Laterality Date   BREAST EXCISIONAL BIOPSY Right    x2   BRONCHIAL BIOPSY  08/30/2023   Procedure: BRONCHIAL BIOPSIES;  Surgeon: Brenna Adine CROME, DO;  Location: [facility];  Service: Pulmonary;;   EYE SURGERY     cataract bilateral    HERNIA REPAIR  06/2000   INCISIONAL VENTRAL ; DR GEORGETTE POLI    KNEE ARTHROSCOPY Left 08/23/2017   dr gerome    LAPAROSCOPIC CHOLECYSTECTOMY  06/13/2000   DR DOUG [facility]    LEFT HEART CATH AND CORONARY ANGIOGRAPHY N/A 12/04/2018   Procedure: LEFT HEART CATH AND CORONARY ANGIOGRAPHY;  Surgeon: Claudene Victory ORN, MD;  Location: [facility] INVASIVE CV LAB;  Service: Cardiovascular;  Laterality: N/A;   TOTAL KNEE ARTHROPLASTY Left 03/31/2018   Procedure: LEFT TOTAL KNEE ARTHROPLASTY;  Surgeon: Gerome Charleston, MD;  Location: [facility] ORS;  Service: Orthopedics;  Laterality: Left;  with block   TOTAL KNEE ARTHROPLASTY Right 04/30/2020   Procedure: RIGHT TOTAL KNEE ARTHROPLASTY;  Surgeon: Melodi Lerner, MD;  Location: [facility] ORS;  Service: Orthopedics;  Laterality: Right;    VAGINAL HYSTERECTOMY  age 31s    REVIEW OF SYSTEMS:  Constitutional: positive for fatigue Eyes: negative Ears, nose, mouth, throat, and face: negative Respiratory: negative Cardiovascular: negative Gastrointestinal: negative Genitourinary:negative Integument/breast: negative Hematologic/lymphatic: negative Musculoskeletal:negative Neurological: negative Behavioral/Psych: negative Endocrine: negative Allergic/Immunologic:  negative   PHYSICAL EXAMINATION: General appearance: alert, cooperative, fatigued, and no distress Head: Normocephalic, without obvious abnormality, atraumatic Neck: moderate anterior cervical adenopathy, no JVD, supple, symmetrical, trachea midline, and thyroid  not enlarged, symmetric, no tenderness/mass/nodules Lymph nodes: Supraclavicular adenopathy: Palpable left supraclavicular lymph node Resp: clear to auscultation bilaterally Back: symmetric, no curvature. ROM normal. No CVA tenderness. Cardio: regular rate and rhythm, S1, S2 normal, no murmur, click, rub or gallop GI: soft, non-tender; bowel sounds normal; no masses,  no organomegaly Extremities: extremities normal, atraumatic, no cyanosis or edema Neurologic: Alert and oriented X 3, normal strength and tone. Normal symmetric reflexes. Normal coordination and gait  ECOG PERFORMANCE STATUS: 1 - Symptomatic but completely ambulatory  Pulse 75, temperature 97.7 F (36.5 C), temperature source Temporal, resp. rate 17, height 5' 5 (1.651 m), weight 174 lb (78.9 kg), SpO2 99%.  LABORATORY DATA: Lab Results  Component Value Date   WBC 4.5 12/14/2024   HGB 14.4 12/14/2024   HCT 41.7 12/14/2024   MCV 86.3 12/14/2024   PLT 192 12/14/2024      Chemistry      Component Value Date/Time   NA 138 12/14/2024 1423  NA 140 03/07/2024 1600   K 4.0 12/14/2024 1423   CL 102 12/14/2024 1423   CO2 26 12/14/2024 1423   BUN 13 12/14/2024 1423   BUN 7 (L) 03/07/2024 1600   CREATININE 1.21 (H) 12/14/2024 1423      Component Value Date/Time   CALCIUM  9.8 12/14/2024 1423   ALKPHOS 91 12/14/2024 1423   AST 33 12/14/2024 1423   ALT 22 12/14/2024 1423   BILITOT 0.4 12/14/2024 1423       RADIOGRAPHIC STUDIES: CT Chest W Contrast Result Date: 12/21/2024 CLINICAL DATA:  Lung cancer EXAM: CT CHEST WITHOUT CONTRAST TECHNIQUE: Multidetector CT imaging of the chest was performed following the standard protocol without IV contrast. RADIATION  DOSE REDUCTION: This exam was performed according to the departmental dose-optimization program which includes automated exposure control, adjustment of the mA and/or kV according to patient size and/or use of iterative reconstruction technique. COMPARISON:  July 24, 2024 FINDINGS: 2.6 x 3.3 x 2.6 cm left supraclavicular lymph node Cardiovascular: The heart size is normal. No significant coronary calcifications. Mediastinum/Nodes: No adenopathy Lungs/Pleura: Left posterior and medial lung. No new lung masses are identified. No pleural effusion. Upper Abdomen: No liver or adrenal mass is identified. Musculoskeletal: No bone lesions IMPRESSION: 2.6 x 3.6 x 2.6 cm necrotic left supraclavicular lymph node unchanged Treatment related changes in the left lung. No recurrent mass or adenopathy in the chest. Electronically Signed   By: Nancyann Burns M.D.   On: 12/21/2024 09:29   CT Soft Tissue Neck W Contrast Result Date: 12/21/2024 CLINICAL DATA:  Non-small-cell lung cancer staging EXAM: CT NECK WITH CONTRAST TECHNIQUE: Multidetector CT imaging of the neck was performed using the standard protocol following the bolus administration of intravenous contrast. RADIATION DOSE REDUCTION: This exam was performed according to the departmental dose-optimization program which includes automated exposure control, adjustment of the mA and/or kV according to patient size and/or use of iterative reconstruction technique. CONTRAST:  75mL OMNIPAQUE  IOHEXOL  300 MG/ML  SOLN COMPARISON:  05/01/2024 FINDINGS: There is a 2.6 x 3.3 x 2.6 cm malignant left supraclavicular lymph node Pharynx: The nasopharynx, oropharynx and hypopharynx are normal Oral cavity/floor of mouth: Normal Larynx: Normal Salivary glands: The parotid and submandibular glands are normal Thyroid : Normal Vascular: No significant abnormality Limited intracranial: No significant abnormality Visualized orbits: No significant abnormality Mastoids and visualized paranasal  sinuses: No significant abnormality Skeleton: No bone lesions are identified. There is cervical spondylosis with severe degenerative disc disease at C5-6 IMPRESSION: 2.6 x 3.3 x 2.6 cm malignant left supraclavicular lymph node. This is stable or slightly smaller compared with May 01, 2024. Electronically Signed   By: Nancyann Burns M.D.   On: 12/21/2024 08:42     ASSESSMENT AND PLAN: This is a very pleasant 84 years old African-American female diagnosed with Stage IIIC (T3, N3, M0) non-small cell lung cancer, adenocarcinoma presented with supraclavicular lymphadenopathy, 2 left lung nodules diagnosed in September 2024.  There is also a short segment of wall thickening involving the splenic flexure of the colon with associated mild hypermetabolism which is nonspecific and colonoscopy was recommended.  Molecular studies showed positive KRAS G12C mutation and PD-L1 expression of 5%. The patient completed a course of concurrent chemoradiation with weekly carboplatin  for AUC of 2 and paclitaxel  45 Mg/M2 started on September 20, 2023.  She is status post 6 week of treatment. The patient underwent consolidation treatment with immunotherapy with Imfinzi  1500 Mg IV every 4 weeks status post 13 cycles.  Last dose was  given November 20, 2024. She is currently on observation.  The patient had repeat CT scan of the neck and chest performed recently.  I personally independently reviewed the scan and discussed the result with the patient and her family.  Her scan showed no concerning findings for disease progression and stable size of the left supraclavicular lymph node. Assessment and Plan Assessment & Plan Locally advanced non-small cell lung cancer, post-chemoradiation and immunotherapy She completed concurrent chemoradiation with carboplatin  and paclitaxel , followed by one year of consolidation durvalumab , with the last dose in December 2025. She tolerated therapy well without significant adverse effects and currently  reports no cancer-related symptoms. Restaging CT scans of the neck and chest demonstrate stable disease with decreased lymph node size and no evidence of progression or new lesions. Persistent fatigue is present, consistent with post-treatment status. - Reviewed recent CT scans of neck and chest; no evidence of disease progression. - No further cancer-directed therapy indicated at this time. - Recommended surveillance imaging every three months for the next six to nine months, with extension to every four to six months if stability is maintained. - Discussed that, if disease progression occurs, options will include targeted therapy and chemotherapy. - Encouraged gradual increase in physical activity to address fatigue. - Scheduled follow-up in three months with repeat imaging. She was advised to call immediately if she has any other concerning symptoms in the interval.  The patient voices understanding of current disease status and treatment options and is in agreement with the current care plan.  All questions were answered. The patient knows to call the clinic with any problems, questions or concerns. We can certainly see the patient much sooner if necessary.  The total time spent in the appointment was 30 minutes including review of chart and various tests results, discussions about plan of care and coordination of care plan .   Disclaimer: This note was dictated with voice recognition software. Similar sounding words can inadvertently be transcribed and may not be corrected upon review.        "

## 2024-12-25 ENCOUNTER — Telehealth: Payer: Self-pay | Admitting: Internal Medicine

## 2024-12-25 NOTE — Telephone Encounter (Signed)
 Scheduled appointments per 1/19 los. Called and left a VM with appointment details for the patient.

## 2025-02-27 ENCOUNTER — Ambulatory Visit: Admitting: Internal Medicine

## 2025-03-18 ENCOUNTER — Inpatient Hospital Stay: Attending: Physician Assistant

## 2025-03-25 ENCOUNTER — Inpatient Hospital Stay: Admitting: Internal Medicine

## 2025-10-22 ENCOUNTER — Inpatient Hospital Stay: Attending: Physician Assistant | Admitting: Internal Medicine
